# Patient Record
Sex: Female | Born: 1940
Health system: Southern US, Community
[De-identification: ages and names within clinical notes are randomized; demographics above are authoritative.]

## PROBLEM LIST (undated history)

## (undated) DIAGNOSIS — I311 Chronic constrictive pericarditis: Secondary | ICD-10-CM

## (undated) DIAGNOSIS — I4891 Unspecified atrial fibrillation: Secondary | ICD-10-CM

## (undated) DIAGNOSIS — I10 Essential (primary) hypertension: Secondary | ICD-10-CM

## (undated) DIAGNOSIS — J189 Pneumonia, unspecified organism: Secondary | ICD-10-CM

## (undated) DIAGNOSIS — M1990 Unspecified osteoarthritis, unspecified site: Secondary | ICD-10-CM

## (undated) DIAGNOSIS — M109 Gout, unspecified: Secondary | ICD-10-CM

## (undated) DIAGNOSIS — I471 Supraventricular tachycardia, unspecified: Secondary | ICD-10-CM

## (undated) DIAGNOSIS — N2889 Other specified disorders of kidney and ureter: Secondary | ICD-10-CM

## (undated) DIAGNOSIS — I509 Heart failure, unspecified: Secondary | ICD-10-CM

## (undated) DIAGNOSIS — J383 Other diseases of vocal cords: Secondary | ICD-10-CM

## (undated) HISTORY — PX: EYE SURGERY: SHX253

## (undated) HISTORY — DX: Unspecified atrial fibrillation: I48.91

## (undated) HISTORY — DX: Supraventricular tachycardia, unspecified: I47.10

## (undated) HISTORY — DX: Supraventricular tachycardia: I47.1

## (undated) HISTORY — DX: Chronic constrictive pericarditis: I31.1

## (undated) HISTORY — PX: TONSILLECTOMY: SUR1361

## (undated) HISTORY — PX: PERICARDIECTOMY: SHX2214

## (undated) HISTORY — DX: Other diseases of vocal cords: J38.3

## (undated) HISTORY — DX: Gout, unspecified: M10.9

## (undated) HISTORY — PX: CHOLECYSTECTOMY: SHX55

## (undated) HISTORY — PX: ABDOMINAL HYSTERECTOMY: SHX81

## (undated) HISTORY — DX: Essential (primary) hypertension: I10

---

## 1999-05-22 ENCOUNTER — Encounter: Payer: Self-pay | Admitting: Gastroenterology

## 1999-05-22 ENCOUNTER — Ambulatory Visit (HOSPITAL_COMMUNITY): Admission: RE | Admit: 1999-05-22 | Discharge: 1999-05-22 | Payer: Self-pay | Admitting: Gastroenterology

## 1999-12-14 ENCOUNTER — Ambulatory Visit (HOSPITAL_COMMUNITY): Admission: RE | Admit: 1999-12-14 | Discharge: 1999-12-14 | Payer: Self-pay | Admitting: Gastroenterology

## 1999-12-14 ENCOUNTER — Encounter (INDEPENDENT_AMBULATORY_CARE_PROVIDER_SITE_OTHER): Payer: Self-pay

## 2000-06-27 ENCOUNTER — Encounter (INDEPENDENT_AMBULATORY_CARE_PROVIDER_SITE_OTHER): Payer: Self-pay | Admitting: *Deleted

## 2000-06-27 ENCOUNTER — Ambulatory Visit (HOSPITAL_COMMUNITY): Admission: RE | Admit: 2000-06-27 | Discharge: 2000-06-27 | Payer: Self-pay | Admitting: Gastroenterology

## 2001-12-12 ENCOUNTER — Encounter: Payer: Self-pay | Admitting: Gastroenterology

## 2001-12-12 ENCOUNTER — Ambulatory Visit (HOSPITAL_COMMUNITY): Admission: RE | Admit: 2001-12-12 | Discharge: 2001-12-12 | Payer: Self-pay | Admitting: Gastroenterology

## 2002-07-08 ENCOUNTER — Emergency Department (HOSPITAL_COMMUNITY): Admission: EM | Admit: 2002-07-08 | Discharge: 2002-07-08 | Payer: Self-pay

## 2002-07-23 ENCOUNTER — Inpatient Hospital Stay (HOSPITAL_COMMUNITY): Admission: EM | Admit: 2002-07-23 | Discharge: 2002-07-25 | Payer: Self-pay | Admitting: Emergency Medicine

## 2002-08-02 ENCOUNTER — Ambulatory Visit (HOSPITAL_COMMUNITY): Admission: RE | Admit: 2002-08-02 | Discharge: 2002-08-02 | Payer: Self-pay | Admitting: Internal Medicine

## 2002-08-21 ENCOUNTER — Ambulatory Visit (HOSPITAL_COMMUNITY): Admission: RE | Admit: 2002-08-21 | Discharge: 2002-08-22 | Payer: Self-pay | Admitting: Internal Medicine

## 2002-10-24 ENCOUNTER — Ambulatory Visit (HOSPITAL_COMMUNITY): Admission: RE | Admit: 2002-10-24 | Discharge: 2002-10-24 | Payer: Self-pay | Admitting: Gastroenterology

## 2002-10-24 ENCOUNTER — Encounter (INDEPENDENT_AMBULATORY_CARE_PROVIDER_SITE_OTHER): Payer: Self-pay | Admitting: *Deleted

## 2004-03-25 ENCOUNTER — Encounter: Admission: RE | Admit: 2004-03-25 | Discharge: 2004-06-23 | Payer: Self-pay | Admitting: Family Medicine

## 2004-12-16 ENCOUNTER — Ambulatory Visit (HOSPITAL_COMMUNITY): Admission: RE | Admit: 2004-12-16 | Discharge: 2004-12-16 | Payer: Self-pay | Admitting: Cardiovascular Disease

## 2004-12-16 ENCOUNTER — Encounter: Payer: Self-pay | Admitting: Cardiovascular Disease

## 2004-12-25 ENCOUNTER — Ambulatory Visit (HOSPITAL_COMMUNITY): Admission: RE | Admit: 2004-12-25 | Discharge: 2004-12-25 | Payer: Self-pay | Admitting: Cardiovascular Disease

## 2004-12-28 ENCOUNTER — Ambulatory Visit (HOSPITAL_COMMUNITY): Admission: RE | Admit: 2004-12-28 | Discharge: 2004-12-28 | Payer: Self-pay | Admitting: Cardiovascular Disease

## 2005-01-06 ENCOUNTER — Ambulatory Visit (HOSPITAL_COMMUNITY): Admission: RE | Admit: 2005-01-06 | Discharge: 2005-01-06 | Payer: Self-pay | Admitting: Cardiovascular Disease

## 2005-03-29 ENCOUNTER — Encounter (HOSPITAL_COMMUNITY): Admission: RE | Admit: 2005-03-29 | Discharge: 2005-05-11 | Payer: Self-pay | Admitting: Cardiovascular Disease

## 2005-05-12 ENCOUNTER — Encounter (HOSPITAL_COMMUNITY): Admission: RE | Admit: 2005-05-12 | Discharge: 2005-08-10 | Payer: Self-pay | Admitting: Cardiovascular Disease

## 2005-08-09 ENCOUNTER — Ambulatory Visit (HOSPITAL_COMMUNITY): Admission: RE | Admit: 2005-08-09 | Discharge: 2005-08-09 | Payer: Self-pay | Admitting: Cardiovascular Disease

## 2005-08-11 ENCOUNTER — Encounter (HOSPITAL_COMMUNITY): Admission: RE | Admit: 2005-08-11 | Discharge: 2005-11-09 | Payer: Self-pay | Admitting: Cardiovascular Disease

## 2005-11-11 ENCOUNTER — Encounter (HOSPITAL_COMMUNITY): Admission: RE | Admit: 2005-11-11 | Discharge: 2006-02-09 | Payer: Self-pay | Admitting: Cardiovascular Disease

## 2006-02-10 ENCOUNTER — Encounter (HOSPITAL_COMMUNITY): Admission: RE | Admit: 2006-02-10 | Discharge: 2006-05-11 | Payer: Self-pay | Admitting: Cardiovascular Disease

## 2006-05-12 ENCOUNTER — Encounter (HOSPITAL_COMMUNITY): Admission: RE | Admit: 2006-05-12 | Discharge: 2006-08-10 | Payer: Self-pay | Admitting: Cardiovascular Disease

## 2006-08-11 ENCOUNTER — Encounter (HOSPITAL_COMMUNITY): Admission: RE | Admit: 2006-08-11 | Discharge: 2006-11-09 | Payer: Self-pay | Admitting: Cardiovascular Disease

## 2006-11-11 ENCOUNTER — Encounter (HOSPITAL_COMMUNITY): Admission: RE | Admit: 2006-11-11 | Discharge: 2007-02-09 | Payer: Self-pay | Admitting: Cardiovascular Disease

## 2007-02-10 ENCOUNTER — Encounter (HOSPITAL_COMMUNITY): Admission: RE | Admit: 2007-02-10 | Discharge: 2007-05-11 | Payer: Self-pay | Admitting: Cardiovascular Disease

## 2007-05-12 ENCOUNTER — Encounter (HOSPITAL_COMMUNITY): Admission: RE | Admit: 2007-05-12 | Discharge: 2007-08-10 | Payer: Self-pay | Admitting: Cardiovascular Disease

## 2007-08-12 ENCOUNTER — Encounter (HOSPITAL_COMMUNITY): Admission: RE | Admit: 2007-08-12 | Discharge: 2007-10-10 | Payer: Self-pay | Admitting: Cardiovascular Disease

## 2007-10-12 ENCOUNTER — Encounter (HOSPITAL_COMMUNITY): Admission: RE | Admit: 2007-10-12 | Discharge: 2008-01-04 | Payer: Self-pay | Admitting: Cardiovascular Disease

## 2007-11-01 ENCOUNTER — Ambulatory Visit (HOSPITAL_COMMUNITY): Admission: RE | Admit: 2007-11-01 | Discharge: 2007-11-01 | Payer: Self-pay | Admitting: Cardiovascular Disease

## 2008-01-10 ENCOUNTER — Encounter (HOSPITAL_COMMUNITY): Admission: RE | Admit: 2008-01-10 | Discharge: 2008-04-09 | Payer: Self-pay | Admitting: Cardiovascular Disease

## 2008-04-10 ENCOUNTER — Encounter (HOSPITAL_COMMUNITY): Admission: RE | Admit: 2008-04-10 | Discharge: 2008-07-09 | Payer: Self-pay | Admitting: Cardiovascular Disease

## 2008-07-11 ENCOUNTER — Encounter (HOSPITAL_COMMUNITY): Admission: RE | Admit: 2008-07-11 | Discharge: 2008-10-07 | Payer: Self-pay | Admitting: Cardiovascular Disease

## 2008-10-11 ENCOUNTER — Encounter (HOSPITAL_COMMUNITY): Admission: RE | Admit: 2008-10-11 | Discharge: 2009-01-09 | Payer: Self-pay | Admitting: Cardiovascular Disease

## 2009-01-10 ENCOUNTER — Encounter (HOSPITAL_COMMUNITY): Admission: RE | Admit: 2009-01-10 | Discharge: 2009-04-10 | Payer: Self-pay | Admitting: Cardiovascular Disease

## 2009-04-11 ENCOUNTER — Encounter (HOSPITAL_COMMUNITY): Admission: RE | Admit: 2009-04-11 | Discharge: 2009-07-10 | Payer: Self-pay | Admitting: Cardiovascular Disease

## 2009-06-30 ENCOUNTER — Ambulatory Visit (HOSPITAL_COMMUNITY): Admission: RE | Admit: 2009-06-30 | Discharge: 2009-06-30 | Payer: Self-pay | Admitting: Cardiovascular Disease

## 2009-06-30 ENCOUNTER — Encounter: Payer: Self-pay | Admitting: Cardiovascular Disease

## 2009-07-11 ENCOUNTER — Encounter (HOSPITAL_COMMUNITY): Admission: RE | Admit: 2009-07-11 | Discharge: 2009-10-09 | Payer: Self-pay | Admitting: Cardiovascular Disease

## 2009-10-11 ENCOUNTER — Encounter (HOSPITAL_COMMUNITY): Admission: RE | Admit: 2009-10-11 | Discharge: 2010-01-09 | Payer: Self-pay | Admitting: Cardiovascular Disease

## 2010-01-10 ENCOUNTER — Encounter (HOSPITAL_COMMUNITY): Admission: RE | Admit: 2010-01-10 | Discharge: 2010-04-10 | Payer: Self-pay | Admitting: Cardiovascular Disease

## 2010-04-11 ENCOUNTER — Encounter (HOSPITAL_COMMUNITY): Admission: RE | Admit: 2010-04-11 | Discharge: 2010-07-10 | Payer: Self-pay | Admitting: Cardiovascular Disease

## 2010-07-11 ENCOUNTER — Encounter (HOSPITAL_COMMUNITY)
Admission: RE | Admit: 2010-07-11 | Discharge: 2010-10-09 | Payer: Self-pay | Source: Home / Self Care | Attending: Cardiovascular Disease | Admitting: Cardiovascular Disease

## 2010-10-13 ENCOUNTER — Encounter (HOSPITAL_COMMUNITY)
Admission: RE | Admit: 2010-10-13 | Discharge: 2010-11-10 | Payer: Self-pay | Source: Home / Self Care | Attending: Cardiovascular Disease | Admitting: Cardiovascular Disease

## 2010-11-01 ENCOUNTER — Encounter: Payer: Self-pay | Admitting: Cardiovascular Disease

## 2010-11-04 ENCOUNTER — Ambulatory Visit: Payer: Self-pay | Admitting: Cardiovascular Disease

## 2010-11-10 ENCOUNTER — Ambulatory Visit: Payer: Self-pay | Admitting: Cardiology

## 2010-11-11 ENCOUNTER — Ambulatory Visit (HOSPITAL_COMMUNITY): Payer: Self-pay

## 2010-11-11 ENCOUNTER — Ambulatory Visit (HOSPITAL_COMMUNITY)
Admission: RE | Admit: 2010-11-11 | Discharge: 2010-11-11 | Payer: Self-pay | Source: Ambulatory Visit | Attending: Cardiovascular Disease | Admitting: Cardiovascular Disease

## 2010-11-12 ENCOUNTER — Encounter (HOSPITAL_COMMUNITY): Payer: Self-pay

## 2010-11-12 ENCOUNTER — Ambulatory Visit (HOSPITAL_COMMUNITY): Payer: Self-pay

## 2010-11-17 ENCOUNTER — Encounter (HOSPITAL_COMMUNITY): Admission: RE | Admit: 2010-11-17 | Payer: Self-pay | Source: Ambulatory Visit

## 2010-11-17 ENCOUNTER — Ambulatory Visit (HOSPITAL_COMMUNITY): Payer: Self-pay

## 2010-11-17 DIAGNOSIS — I319 Disease of pericardium, unspecified: Secondary | ICD-10-CM | POA: Insufficient documentation

## 2010-11-17 DIAGNOSIS — Z5189 Encounter for other specified aftercare: Secondary | ICD-10-CM | POA: Insufficient documentation

## 2010-11-17 DIAGNOSIS — I471 Supraventricular tachycardia, unspecified: Secondary | ICD-10-CM | POA: Insufficient documentation

## 2010-11-17 DIAGNOSIS — I4891 Unspecified atrial fibrillation: Secondary | ICD-10-CM | POA: Insufficient documentation

## 2010-11-17 DIAGNOSIS — I10 Essential (primary) hypertension: Secondary | ICD-10-CM | POA: Insufficient documentation

## 2010-11-17 DIAGNOSIS — Z8249 Family history of ischemic heart disease and other diseases of the circulatory system: Secondary | ICD-10-CM | POA: Insufficient documentation

## 2010-11-18 ENCOUNTER — Ambulatory Visit (HOSPITAL_COMMUNITY): Payer: Self-pay

## 2010-11-18 ENCOUNTER — Encounter (HOSPITAL_COMMUNITY): Payer: Self-pay | Attending: Cardiovascular Disease

## 2010-11-19 ENCOUNTER — Ambulatory Visit (HOSPITAL_COMMUNITY): Payer: Self-pay

## 2010-11-19 ENCOUNTER — Encounter (HOSPITAL_COMMUNITY): Payer: Self-pay

## 2010-11-24 ENCOUNTER — Encounter (HOSPITAL_COMMUNITY): Payer: Self-pay

## 2010-11-24 ENCOUNTER — Ambulatory Visit (HOSPITAL_COMMUNITY): Payer: Self-pay

## 2010-11-25 ENCOUNTER — Ambulatory Visit (HOSPITAL_COMMUNITY): Payer: Self-pay

## 2010-11-25 ENCOUNTER — Encounter (HOSPITAL_COMMUNITY): Payer: Self-pay

## 2010-11-26 ENCOUNTER — Ambulatory Visit (HOSPITAL_COMMUNITY): Payer: Self-pay

## 2010-11-26 ENCOUNTER — Encounter (HOSPITAL_COMMUNITY): Payer: Self-pay

## 2010-12-01 ENCOUNTER — Encounter (HOSPITAL_COMMUNITY): Payer: Self-pay

## 2010-12-01 ENCOUNTER — Ambulatory Visit (HOSPITAL_COMMUNITY): Payer: Self-pay

## 2010-12-02 ENCOUNTER — Ambulatory Visit (HOSPITAL_COMMUNITY): Payer: Self-pay

## 2010-12-02 ENCOUNTER — Encounter (HOSPITAL_COMMUNITY): Payer: Self-pay

## 2010-12-03 ENCOUNTER — Encounter (HOSPITAL_COMMUNITY): Payer: Self-pay

## 2010-12-03 ENCOUNTER — Ambulatory Visit (HOSPITAL_COMMUNITY): Payer: Self-pay

## 2010-12-08 ENCOUNTER — Ambulatory Visit (HOSPITAL_COMMUNITY): Payer: Self-pay

## 2010-12-08 ENCOUNTER — Encounter (HOSPITAL_COMMUNITY): Payer: Self-pay

## 2010-12-09 ENCOUNTER — Ambulatory Visit (HOSPITAL_COMMUNITY): Payer: Self-pay

## 2010-12-09 ENCOUNTER — Encounter (HOSPITAL_COMMUNITY): Admission: RE | Admit: 2010-12-09 | Payer: Self-pay | Source: Ambulatory Visit

## 2010-12-10 ENCOUNTER — Ambulatory Visit (HOSPITAL_COMMUNITY): Payer: Self-pay

## 2010-12-10 ENCOUNTER — Encounter (HOSPITAL_COMMUNITY): Payer: Self-pay | Attending: Cardiovascular Disease

## 2010-12-10 DIAGNOSIS — I471 Supraventricular tachycardia, unspecified: Secondary | ICD-10-CM | POA: Insufficient documentation

## 2010-12-10 DIAGNOSIS — Z5189 Encounter for other specified aftercare: Secondary | ICD-10-CM | POA: Insufficient documentation

## 2010-12-10 DIAGNOSIS — I10 Essential (primary) hypertension: Secondary | ICD-10-CM | POA: Insufficient documentation

## 2010-12-10 DIAGNOSIS — Z8249 Family history of ischemic heart disease and other diseases of the circulatory system: Secondary | ICD-10-CM | POA: Insufficient documentation

## 2010-12-10 DIAGNOSIS — I319 Disease of pericardium, unspecified: Secondary | ICD-10-CM | POA: Insufficient documentation

## 2010-12-10 DIAGNOSIS — I4891 Unspecified atrial fibrillation: Secondary | ICD-10-CM | POA: Insufficient documentation

## 2010-12-15 ENCOUNTER — Ambulatory Visit (HOSPITAL_COMMUNITY): Payer: Self-pay

## 2010-12-15 ENCOUNTER — Encounter (HOSPITAL_COMMUNITY): Payer: Self-pay

## 2010-12-16 ENCOUNTER — Encounter (HOSPITAL_COMMUNITY): Payer: Self-pay

## 2010-12-16 ENCOUNTER — Ambulatory Visit (HOSPITAL_COMMUNITY): Payer: Self-pay

## 2010-12-17 ENCOUNTER — Ambulatory Visit (HOSPITAL_COMMUNITY): Payer: Self-pay

## 2010-12-17 ENCOUNTER — Encounter (HOSPITAL_COMMUNITY): Payer: Self-pay

## 2010-12-22 ENCOUNTER — Ambulatory Visit (HOSPITAL_COMMUNITY): Payer: Self-pay

## 2010-12-22 ENCOUNTER — Encounter (HOSPITAL_COMMUNITY): Payer: Self-pay

## 2010-12-23 ENCOUNTER — Ambulatory Visit (HOSPITAL_COMMUNITY): Payer: Self-pay

## 2010-12-23 ENCOUNTER — Encounter (HOSPITAL_COMMUNITY): Payer: Self-pay

## 2010-12-24 ENCOUNTER — Ambulatory Visit (HOSPITAL_COMMUNITY): Payer: Self-pay

## 2010-12-24 ENCOUNTER — Encounter (HOSPITAL_COMMUNITY): Payer: Self-pay

## 2010-12-29 ENCOUNTER — Ambulatory Visit (HOSPITAL_COMMUNITY): Payer: Self-pay

## 2010-12-29 ENCOUNTER — Encounter (HOSPITAL_COMMUNITY): Payer: Self-pay

## 2010-12-30 ENCOUNTER — Ambulatory Visit (HOSPITAL_COMMUNITY): Payer: Self-pay

## 2010-12-30 ENCOUNTER — Encounter (HOSPITAL_COMMUNITY): Payer: Self-pay

## 2010-12-31 ENCOUNTER — Encounter (HOSPITAL_COMMUNITY): Payer: Self-pay

## 2010-12-31 ENCOUNTER — Ambulatory Visit (HOSPITAL_COMMUNITY): Payer: Self-pay

## 2011-01-05 ENCOUNTER — Encounter (HOSPITAL_COMMUNITY): Admission: RE | Admit: 2011-01-05 | Payer: Self-pay | Source: Ambulatory Visit

## 2011-01-05 ENCOUNTER — Ambulatory Visit (HOSPITAL_COMMUNITY): Payer: Self-pay

## 2011-01-06 ENCOUNTER — Encounter (HOSPITAL_COMMUNITY): Payer: Self-pay

## 2011-01-06 ENCOUNTER — Ambulatory Visit (HOSPITAL_COMMUNITY): Payer: Self-pay

## 2011-01-07 ENCOUNTER — Encounter (HOSPITAL_COMMUNITY): Payer: Self-pay

## 2011-01-07 ENCOUNTER — Ambulatory Visit (HOSPITAL_COMMUNITY): Payer: Self-pay

## 2011-01-12 ENCOUNTER — Encounter (HOSPITAL_COMMUNITY): Payer: Self-pay | Attending: Cardiovascular Disease

## 2011-01-12 ENCOUNTER — Ambulatory Visit (HOSPITAL_COMMUNITY): Payer: Self-pay

## 2011-01-12 DIAGNOSIS — Z8249 Family history of ischemic heart disease and other diseases of the circulatory system: Secondary | ICD-10-CM | POA: Insufficient documentation

## 2011-01-12 DIAGNOSIS — Z5189 Encounter for other specified aftercare: Secondary | ICD-10-CM | POA: Insufficient documentation

## 2011-01-12 DIAGNOSIS — I319 Disease of pericardium, unspecified: Secondary | ICD-10-CM | POA: Insufficient documentation

## 2011-01-12 DIAGNOSIS — I471 Supraventricular tachycardia, unspecified: Secondary | ICD-10-CM | POA: Insufficient documentation

## 2011-01-12 DIAGNOSIS — I10 Essential (primary) hypertension: Secondary | ICD-10-CM | POA: Insufficient documentation

## 2011-01-12 DIAGNOSIS — I4891 Unspecified atrial fibrillation: Secondary | ICD-10-CM | POA: Insufficient documentation

## 2011-01-13 ENCOUNTER — Encounter (HOSPITAL_COMMUNITY): Payer: Self-pay

## 2011-01-13 ENCOUNTER — Ambulatory Visit (HOSPITAL_COMMUNITY): Payer: Self-pay

## 2011-01-14 ENCOUNTER — Encounter (HOSPITAL_COMMUNITY): Payer: Self-pay

## 2011-01-14 ENCOUNTER — Ambulatory Visit (HOSPITAL_COMMUNITY): Payer: Self-pay

## 2011-01-15 LAB — PROTIME-INR: INR: 1.9 — ABNORMAL HIGH (ref 0.00–1.49)

## 2011-01-19 ENCOUNTER — Ambulatory Visit (HOSPITAL_COMMUNITY): Payer: Self-pay

## 2011-01-19 ENCOUNTER — Encounter (HOSPITAL_COMMUNITY): Payer: Self-pay

## 2011-01-20 ENCOUNTER — Ambulatory Visit (HOSPITAL_COMMUNITY): Payer: Self-pay

## 2011-01-20 ENCOUNTER — Encounter (HOSPITAL_COMMUNITY): Payer: Self-pay

## 2011-01-21 ENCOUNTER — Encounter (HOSPITAL_COMMUNITY): Payer: Self-pay

## 2011-01-21 ENCOUNTER — Ambulatory Visit (HOSPITAL_COMMUNITY): Payer: Self-pay

## 2011-01-26 ENCOUNTER — Ambulatory Visit (HOSPITAL_COMMUNITY): Payer: Self-pay

## 2011-01-26 ENCOUNTER — Encounter (HOSPITAL_COMMUNITY): Payer: Self-pay

## 2011-01-27 ENCOUNTER — Encounter (HOSPITAL_COMMUNITY): Payer: Self-pay

## 2011-01-27 ENCOUNTER — Ambulatory Visit (HOSPITAL_COMMUNITY): Payer: Self-pay

## 2011-01-28 ENCOUNTER — Encounter (HOSPITAL_COMMUNITY): Payer: Self-pay

## 2011-01-28 ENCOUNTER — Ambulatory Visit (HOSPITAL_COMMUNITY): Payer: Self-pay

## 2011-02-02 ENCOUNTER — Ambulatory Visit (HOSPITAL_COMMUNITY): Payer: Self-pay

## 2011-02-02 ENCOUNTER — Encounter (HOSPITAL_COMMUNITY): Payer: Self-pay

## 2011-02-03 ENCOUNTER — Ambulatory Visit (HOSPITAL_COMMUNITY): Payer: Self-pay

## 2011-02-03 ENCOUNTER — Encounter (HOSPITAL_COMMUNITY): Payer: Self-pay

## 2011-02-04 ENCOUNTER — Encounter (HOSPITAL_COMMUNITY): Payer: Self-pay

## 2011-02-04 ENCOUNTER — Ambulatory Visit (HOSPITAL_COMMUNITY): Payer: Self-pay

## 2011-02-09 ENCOUNTER — Encounter (HOSPITAL_COMMUNITY): Payer: Self-pay

## 2011-02-09 ENCOUNTER — Ambulatory Visit (HOSPITAL_COMMUNITY): Payer: Self-pay

## 2011-02-10 ENCOUNTER — Encounter (HOSPITAL_COMMUNITY): Payer: Self-pay

## 2011-02-10 ENCOUNTER — Ambulatory Visit (HOSPITAL_COMMUNITY): Payer: Self-pay

## 2011-02-11 ENCOUNTER — Ambulatory Visit (HOSPITAL_COMMUNITY): Payer: Self-pay

## 2011-02-11 ENCOUNTER — Encounter (HOSPITAL_COMMUNITY): Payer: Self-pay

## 2011-02-16 ENCOUNTER — Encounter (HOSPITAL_COMMUNITY): Admission: RE | Admit: 2011-02-16 | Payer: Self-pay | Source: Ambulatory Visit

## 2011-02-16 ENCOUNTER — Ambulatory Visit (HOSPITAL_COMMUNITY): Payer: Self-pay

## 2011-02-16 DIAGNOSIS — I10 Essential (primary) hypertension: Secondary | ICD-10-CM | POA: Insufficient documentation

## 2011-02-16 DIAGNOSIS — Z5189 Encounter for other specified aftercare: Secondary | ICD-10-CM | POA: Insufficient documentation

## 2011-02-16 DIAGNOSIS — I319 Disease of pericardium, unspecified: Secondary | ICD-10-CM | POA: Insufficient documentation

## 2011-02-16 DIAGNOSIS — I4891 Unspecified atrial fibrillation: Secondary | ICD-10-CM | POA: Insufficient documentation

## 2011-02-16 DIAGNOSIS — I471 Supraventricular tachycardia, unspecified: Secondary | ICD-10-CM | POA: Insufficient documentation

## 2011-02-16 DIAGNOSIS — Z8249 Family history of ischemic heart disease and other diseases of the circulatory system: Secondary | ICD-10-CM | POA: Insufficient documentation

## 2011-02-17 ENCOUNTER — Ambulatory Visit (HOSPITAL_COMMUNITY): Payer: Self-pay

## 2011-02-17 ENCOUNTER — Encounter (HOSPITAL_COMMUNITY): Payer: Self-pay | Attending: Cardiovascular Disease

## 2011-02-18 ENCOUNTER — Ambulatory Visit (HOSPITAL_COMMUNITY): Payer: Self-pay

## 2011-02-18 ENCOUNTER — Encounter (HOSPITAL_COMMUNITY): Payer: Self-pay

## 2011-02-23 ENCOUNTER — Ambulatory Visit (HOSPITAL_COMMUNITY): Payer: Self-pay

## 2011-02-23 ENCOUNTER — Encounter (HOSPITAL_COMMUNITY): Payer: Self-pay

## 2011-02-23 NOTE — H&P (Signed)
NAMEEMMALEE, SOLIVAN NO.:  [id number]   MEDICAL RECORD NO.:  [id number]           PATIENT TYPE:   LOCATION:                                 FACILITY:   PHYSICIAN:  Vesta Mixer, M.D. DATE OF BIRTH:  1940/11/16   DATE OF ADMISSION:  11/01/2007  DATE OF DISCHARGE:                              HISTORY & PHYSICAL   Yolanda Bell is a middle-aged female with a history of atrial  fibrillation, constrictive pericarditis and liver disease.  Yolanda Bell is  admitted now for elective cardioversion.   Yolanda Bell has a history of constrictive pericarditis.  Yolanda Bell has a history of  pericardial stripping.  Yolanda Bell has been in atrial fibrillation.  We have  cardioverted her in the past, but Yolanda Bell has converted back to atrial  fibrillation.  Yolanda Bell is now admitted again for cardioversion.   Yolanda Bell has done fairly well.  Yolanda Bell has had some fatigue and some shortness  of breath, but has not had any syncope or presyncope.   Yolanda Bell has normal left ventricular systolic function with an ejection  fraction of 50 to 55%.   CURRENT MEDICATIONS:  1. Toprol XL 50 mg p.o. b.i.d.  2. Aldactone 50 mg a day.  3. Torsemide 20 mg a day.  4. Digoxin 0.25 mg a day.  5. Fish oil 1200 mg 3 times a day.  6. Aspirin 81 mg a day.  7. Fosamax once a week.  8. Vitamin B complex once a day.  9. Calcium once a day.  10.Coumadin 10 mg 1 day a week, with 7.5 mg 6 days a week.   ALLERGIES:  Yolanda Bell is allergic to ERYTHROMYCIN which causes nausea and  vomiting.   PAST MEDICAL HISTORY:  1. History of liver cirrhosis.  2. History of pericardial constriction -- status post pericardectomy.  3. Atrial fibrillation.  4. Spastic dysphonia.   SOCIAL HISTORY:  The patient is a nonsmoker, and Yolanda Bell does not drink  alcohol.   FAMILY HISTORY:  Positive for valvular disease and coronary artery  disease.   REVIEW OF SYSTEMS:  Reviewed and is essentially negative.   PHYSICAL EXAMINATION:  GENERAL:  On exam Yolanda Bell is a middle-aged female  in  no acute distress.  Yolanda Bell is alert and oriented x3, and her mood and  affect are normal.  VITALS:  Her weight is 168, blood pressure is 110/70, with a heart rate  of 93.  HEENT: Exam reveals 2+ carotids.  Yolanda Bell has no bruits, no JVD, no  thyromegaly.  LUNGS: Clear to auscultation.  HEART:  Irregularly irregular.  ABDOMINAL:  Exam reveals good bowel sounds and is nontender.  EXTREMITIES:  Yolanda Bell has no clubbing, cyanosis or edema.  NEURO:  Exam is nonfocal.   Her EKG reveals atrial fibrillation with a controlled ventricular  response.  Yolanda Bell has nonspecific ST and T-wave changes.   Yolanda Bell presents with persistent atrial fibrillation for the past several  months.  Yolanda Bell has been on Coumadin and has been therapeutic for over a  month.  We will schedule her for elective cardioversion.  We have  discussed the  risks, benefits and options of cardioversion.  Yolanda Bell  understands and agrees to proceed.           ______________________________  Vesta Mixer, M.D.     PJN/MEDQ  D:  10/26/2007  T:  10/26/2007  Job:  [id number]   cc:   Caryn Bee L. Little, M.D.

## 2011-02-24 ENCOUNTER — Encounter (HOSPITAL_COMMUNITY): Payer: Self-pay

## 2011-02-24 ENCOUNTER — Ambulatory Visit (HOSPITAL_COMMUNITY): Payer: Self-pay

## 2011-02-25 ENCOUNTER — Encounter (HOSPITAL_COMMUNITY): Payer: Self-pay

## 2011-02-25 ENCOUNTER — Ambulatory Visit (HOSPITAL_COMMUNITY): Payer: Self-pay

## 2011-02-26 NOTE — Discharge Summary (Signed)
   NAME:  CHINWE, LOPE NO.:  [id number]   MEDICAL RECORD NO.:  [id number]                   PATIENT TYPE:  OIB   LOCATION:  2022                                 FACILITY:  [facility]   PHYSICIAN:  Chinita Pester, C.R.N.P. [facility]           DATE OF BIRTH:  02-03-1941   DATE OF ADMISSION:  08/21/2002  DATE OF DISCHARGE:  08/22/2002                                 DISCHARGE SUMMARY   PRIMARY DISCHARGE DIAGNOSIS:  Atrial tachycardia.   HISTORY OF PRESENT ILLNESS:  This is a 70 year old female with a past  history of liver disease, atrial fibrillation, by report on Coumadin.  The  patient developed regular tachy palpitations associated with shortness of  breath approximately two weeks ago, while being transported by paramedics.  Because of reported hemodynamic instability the patient underwent VC  cardioversion.  Her dose of atenolol was increased to 200 mg daily.  She was  doing well until presenting again to the hospital on the day prior to  admission with tachy palpitations.  Telemetry strips demonstrated a narrow  QRS tachycardia at a rate of 230 beats per minute.  It was unclear whether  this was an SVT or 1:1 atrial flutter.  She was admitted for catheter  ablation.  The patient notes that when she has her SVT, she has rare  palpitations and predominant symptoms is that of chest pressure and  shortness of breath.   HOSPITAL COURSE:  The patient was admitted and the patient had successful  ablation of two pathways.  Impression was two right atrial tachycardias,  both related to CT blank right atrium, one mid right atrium, two  questionable AVNRT, not reproducible, and self-limited, and three was SNRT.  Recommendations were to observe on atenolol 75 mg daily.  The patient was  discharged to home in stable condition on atenolol 75 mg daily, aldactone 75  daily, Lasix 40 daily, Tylenol 1-2 tablets as needed p.r.n. for pain.  Low  fat, low salt, low cholesterol  diet.  She was to shower.  Call for any  drainage or lump develops in her groin.  She was to follow with Chinita Pester  on 09/03/02 at 2 p.m. and follow with Dr. Phineas Real within six weeks.                                               Chinita Pester, C.R.N.P. [facility]    DS/MEDQ  D:  08/22/2002  T:  08/22/2002  Job:  [id number]   cc:   Duke Salvia, M.D. [facility]   Bea Laura Graceann Congress, M.D. [facility]   Shashi Nagaraj

## 2011-02-26 NOTE — Procedures (Signed)
[place]  Patient:    Yolanda Bell, Yolanda Bell                       MRN: [id number] Proc. Date: 06/27/00 Adm. Date:  [id number] Attending:  Nelda Marseille CC:         Petra Kuba, M.D.  Anna Genre Little, M.D.   Procedure Report  PROCEDURE PERFORMED:  Colonoscopy with polypectomy.  ENDOSCOPIST:  Petra Kuba, M.D.  INDICATIONS FOR PROCEDURE:  Large polyp removed from the rectosigmoid junction, want to recheck to see if any residual polyp remaining.  Consent was signed after risks, benefits, methods, and options were thoroughly discussed in the past in the office.  MEDICATIONS USED:  Demerol 50 mg, Versed 5 mg.  DESCRIPTION OF PROCEDURE:  Rectal inspection was pertinent for external hemorrhoids.  Digital exam was negative.  The pediatric video colonoscope was inserted and easily advanced around the colon to the cecum.  No significant abnormalities but left-sided diverticula were seen.  The cecum was identified by the appendiceal orifice and ileocecal valve.  The prep was adequate.  There was some liquid stool that required washing and suctioning.  On slow withdrawal through the colon, the cecum, ascending and majority of the transverse was normal.  In the approximate area of the splenic flexure, a 2 to 3 mm polyp was seen and was hot biopsied x 2 and put in the first container. The scope was further withdrawn.  Other than left-sided diverticula, no other abnormalities were seen as we slowly withdrew back to the rectosigmoid junction and behind the rectosigmoid ____________ possibly a very small amount of adenomatous tissue in a sessile nature was seen and multiple hot biopsies of this area were obtained.  The scope was then retroflexed which revealed some internal hemorrhoids.  We did go ahead and roll her on her back and took a few more hot biopsies.  There was no obvious residual adenomatous tissue but we could not be sure.  We got better  visualization with her on her left side and we went ahead and proceeded with painting the polypoid area using the argon plasma coagulator in the customary fashion.  The patient tolerated this part of the procedure well.  There was no obvious adenomatous tissue remaining but obviously based on necrosis and edema, this is difficult to say.  Air was suctioned, scope removed.  The patient tolerated the procedure well.  There was no obvious immediate complication.  ENDOSCOPIC DIAGNOSIS: 1. Internal and external hemorrhoids. 2. Left diverticula. 3. Probable slight residual sessile polyp at the rectosigmoid junction behind    the folds, status post multiple hot biopsies and using the argon plasma    coagulator afterward. 4. Small splenic flexure polyp, status post hot biopsy put in a different    container. 5. Otherwise within normal limits to the cecum.  PLAN:  Await pathology to determine future colonic screening, probably would proceed sooner than later and otherwise return care to Dr. Clarene Duke for customary health care maintenance to include yearly rectals and guaiacs and will put her on two week customary postpolypectomy instructions. DD:  06/27/00 TD:  06/28/00 Job: [id number]

## 2011-02-26 NOTE — Cardiovascular Report (Signed)
Yolanda Bell, Yolanda Bell NO.:  [id number]   MEDICAL RECORD NO.:  [id number]          PATIENT TYPE:  OIB   LOCATION:  2899                         FACILITY:  [facility]   PHYSICIAN:  Vesta Mixer, M.D. DATE OF BIRTH:  [birth year]   DATE OF PROCEDURE:  01/06/2005  DATE OF DISCHARGE:                              CARDIAC CATHETERIZATION   Yolanda Bell is a 70 year old female with a recent diagnosis of atrial  fibrillation. She has a history of spastic dysphonia and cryptogenic  cirrhosis. She recently has been having lots of problems with fluid  retention and ascites. We performed a CT scan, and she was found to have  significant pericardial calcifications. She is referred for right and left  heart catheterization for further evaluation of the possibility of  pericardial constriction.   PROCEDURES:  Right to left heart catheterization with coronary angiography.   DESCRIPTION OF PROCEDURE:  The patient received 3000 units of IV heparin  with an ACT of 184.   HEMODYNAMICS:  RA pressure is 16. RV pressure is 35/16. Pulmonary capillary  wedge pressure 19. Pulmonary artery pressure is 35/20 with a mean of 26. PA  saturation is 61. RFA saturation 92. Cardiac output by thermodilution is  2.9. Cardiac output by Fick is 2.8. Aortic pressure is 96/610.   LV pressure is 100/15.   Simultaneous LV and RV pressures revealed that there was a discordance of  the LV/RV systolic pressure with inspiration.   There is also a dip in plateau of the RV diastolic filling.   The right atrial pressures did not vary at all even with deep inspiration.  All of these were consistent with pericardial constriction.   The left ventriculogram was performed in a 30 RAO position. It revealed a  low-normal left ventricular systolic function with an ejection fraction of  approximately 50%.   CORONARY ANGIOGRAM:  The left main is smooth and normal.   The left anterior descending artery is smooth and  normal. There are several  moderate size diagonal branches which were normal.   The left circumflex artery is a fairly large vessel. It gives off several  large obtuse marginal arteries which were normal. The circumflex terminates  as two small posterolateral branches.   The right coronary artery is moderate size and is dominant. It is smooth and  normal throughout its course.   Extensive calcification can be seen along the entire pericardium. It is  especially severe on the inferior wall and on the anterior wall.   COMPLICATIONS:  None.   CONCLUSION:  1.  Smooth and normal coronary arteries.  2.  Low-normal left ventricular systolic function  3.  The patient has hemodynamic findings that are consistent with a      pericardial constriction. She has extensive pericardial calcification as      seen on this heart catheterization. She has also had pericardial      calcification seen by CT scan. We will review this data. She will quite      likely need to go for pericardial stripping.      PJN/MEDQ  D:  01/06/2005  T:  01/06/2005  Job:  [id number]

## 2011-02-26 NOTE — Consult Note (Signed)
NAME:  Yolanda Bell, Yolanda Bell NO.:  [id number]   MEDICAL RECORD NO.:  [id number]                   PATIENT TYPE:  INP   LOCATION:  3735                                 FACILITY:  [facility]   PHYSICIAN:  Doylene Canning. Ladona Ridgel, M.D. [facility]           DATE OF BIRTH:  March 31, 1941   DATE OF CONSULTATION:  07/24/2002  DATE OF DISCHARGE:                                   CONSULTATION   INDICATION FOR CONSULTATION:  Evaluation of a narrow QRS tachycardia  associated with near syncope.   HISTORY OF PRESENT ILLNESS:  The patient is a very pleasant 70 year old  woman with a history of liver disease and history of atrial fibrillation by  report, on Coumadin.  The patient developed regular tachy-palpitations  associated with shortness of breath approximately two weeks ago and while  being transported by the paramedics, because of reported hemodynamic  instability, the patient underwent D-C cardioversion.  Her dose of atenolol  was increased to 200 mg a day and she was doing well, until presenting again  to the hospital yesterday with recurrent tachy-palpitations.  Telemetry  strips demonstrated a narrow QRS tachycardia at a rate of 230 beats a  minute.  It was unclear whether this represented SVT or 1:1 atrial flutter.  She was admitted for catheter ablation.  The patient notes that when she has  her SVT, she has rare palpitations and her predominant symptom is that of  chest pressure and shortness of breath.  She also complains of symptoms of  an upper respiratory infection over the last several days without any  obvious fevers.   PAST MEDICAL HISTORY:  Past medical history is notable for a history of  liver disease, a history of hypertension, a history of spasmodic dysphonia,  a history of atrial fibrillation with D-C cardioversion done back in  February of 2002 and by her report, there is a history of cholecystectomy  and hysterectomy and a history of a liver biopsy.   FAMILY  HISTORY:  Family history is notable for her mother, who is still  living at age 73, and her father, who died at age 50 of coronary disease.   SOCIAL HISTORY:  The patient is single.  She lives in [place].  She is  divorced.  She has one child.  She denies tobacco or ethanol abuse.   MEDICATIONS ON ADMISSION:  1. Coumadin.  2. Atenolol 200 mg per day.   REVIEW OF SYSTEMS:  Review of systems is notable for a sensation of fevers  but no night sweats or adenopathy.  She denies any vision or hearing  problems but does have nasal discharge and some hoarseness.  She denies any  skin rashes or lesions.  She denies chest pain or shortness of breath or  dyspnea on exertion except when she has SVT.  She has no frank syncope but  does have presyncope.  She has had a  recent cough.  She denies arthralgias  or arthritis.  She denies any dysuria, hematuria or nocturia.  She denies  any nausea, vomiting, diarrhea or constipation.  She denies any weakness,  numbness or depression.  She denies polyuria or polydipsia.   PHYSICAL EXAMINATION:  GENERAL:  On physical exam, she is a pleasant, obese,  middle-aged woman in no distress.  VITAL SIGNS:  The blood pressure was 100/60, the pulse 72 and regular, the  respirations were 20.  Weight was 200 pounds.  HEENT:  Normocephalic and atraumatic.  The pupils are equal and round.  The  oropharynx is moist.  The sclerae were anicteric.  NECK:  Neck revealed no jugular venous distention.  Carotids were 2+ and  symmetric.  CARDIOVASCULAR:  Exam revealed a regular rate and rhythm with normal S1 and  S2.  LUNGS:  The lungs were clear bilaterally.  ABDOMEN:  Soft, nontender and nontender.  There was some hepatomegaly  present.  EXTREMITIES:  No cyanosis, clubbing or edema.  NEUROLOGIC:  She is alert and oriented x3 with cranial nerves II-XII grossly  intact.   LABORATORY AND ACCESSORY CLINICAL DATA:  EKG demonstrates normal sinus  rhythm with no clearcut  evidence of ventricular preexcitation.   A telemetry strip demonstrated a narrow QRS tachycardia at a rate of 230  beats per minute.  P waves were not clearcut, although there was certainly a  suggestion that she had atrial flutter with 1:1 AV conduction.   IMPRESSION:  1. Narrow QRS tachycardia, etiology unclear.  2. History of atrial fibrillation.  3. History of liver disease.  4. Chronic Coumadin therapy.   DISCUSSION:  I discussed the treatment options with the patient.  Because of  her very rapid SVT and her severe symptoms, we will plan to proceed with  electrophysiology study and RF catheter ablation, once her INR becomes  subtherapeutic.  The risks, benefits, goals and expectations of this  procedure have been discussed with the patient and she wishes to proceed.                                               Doylene Canning. Ladona Ridgel, M.D. [facility]    GWT/MEDQ  D:  07/24/2002  T:  07/25/2002  Job:  [id number]   cc:   Vesta Mixer, M.D.   Anna Genre Little, M.D.  [address]  [place]  [place] [address]  Fax: [phone]   Kathrine Cords, R.N. [facility]  [address]  [place], [place] [address]  Fax: [phone]

## 2011-02-26 NOTE — Op Note (Signed)
[name], Yolanda Bell NO.:  [id number]   MEDICAL RECORD NO.:  [id number]                    PATIENT TYPE:   LOCATION:                                       FACILITY:   PHYSICIAN:  Petra Kuba, M.D.                 DATE OF BIRTH:  [birth year]   DATE OF PROCEDURE:  10/24/2002  DATE OF DISCHARGE:                                 OPERATIVE REPORT   PROCEDURE PERFORMED:  Colonoscopy with polypectomy.   ENDOSCOPIST:  Petra Kuba, M.D.   INDICATIONS FOR PROCEDURE:  Patient with history of significant polyps due  for repeat screening.  Consent was signed after the risks, benefits, methods  and options were thoroughly discussed with the patient on multiple  occasions.   MEDICINES USED:  Demerol 50 mg, Versed 5 mg.   DESCRIPTION OF PROCEDURE:  Rectal inspection was pertinent for external  hemorrhoids, small.  Digital exam was negative.  A video pediatric  adjustable colonoscope was inserted and easily advanced around the colon to  the cecum.  This did not require any abdominal pressure or any position  changes.  Other than left-sided moderate diverticula, no abnormalities were  seen.  In fact the patient was inserted a short ways into the terminal  ileum, which was normal.  Photodocumentation was obtained.  The scope was  slowly withdrawn.  The prep was adequate.  There was some liquid stool that  required washing and suctioning.  On slow withdrawal through the colon, the  cecum, ascending and transverse were all normal.  The diverticula started  around the splenic flexure and continued to the rectum.  In the midsigmoid,  a tiny polyp was seen and was hot biopsied times one.  Scope was withdrawn  back to the rectum and retroflexed, pertinent for some internal hemorrhoids.  We did not see the rectosigmoid junction polyp on insertion or withdrawal  and could not tell where the previous cautery margins were.  We then spent  the next five to 10 minutes  looking behind all the folds and again could not  find any evidence of any residual polyp or even the previous cautery  margins.  The scope was inserted a short ways up the left side of the colon.  Air was suctioned, scope removed.  The patient tolerated the procedure well  without obvious complication.   ENDOSCOPIC DIAGNOSIS:  1. Internal and external small hemorrhoids.  2. Left-sided diverticula.  3. No obvious residual polyp or polyp cautery site at the rectosigmoid     junction or distal sigmoid.  4. Midsigmoid tiny polyp hot biopsied.  5. Otherwise within normal limits to the cecum and terminal ileum.    PLAN:  Happy to see back p.r.n.  Yearly rectals or guaiacs per Dr. Clarene Duke.  Otherwise probably repeat screening in four years based on previous polyps.  Petra Kuba, M.D.    MEM/MEDQ  D:  10/24/2002  T:  10/24/2002  Job:  [id number]   cc:   Caryn Bee L. Little, M.D.  [address]  [place]  [place] [address]  Fax: [phone]   Vesta Mixer, M.D.  [address]  [place]  [place] [address]  Fax: [phone]

## 2011-03-02 ENCOUNTER — Encounter (HOSPITAL_COMMUNITY): Payer: Self-pay

## 2011-03-02 ENCOUNTER — Ambulatory Visit (HOSPITAL_COMMUNITY): Payer: Self-pay

## 2011-03-03 ENCOUNTER — Ambulatory Visit (HOSPITAL_COMMUNITY): Payer: Self-pay

## 2011-03-03 ENCOUNTER — Encounter (HOSPITAL_COMMUNITY): Payer: Self-pay

## 2011-03-04 ENCOUNTER — Ambulatory Visit (HOSPITAL_COMMUNITY): Payer: Self-pay

## 2011-03-04 ENCOUNTER — Encounter (HOSPITAL_COMMUNITY): Payer: Self-pay

## 2011-03-09 ENCOUNTER — Ambulatory Visit (HOSPITAL_COMMUNITY): Payer: Self-pay

## 2011-03-09 ENCOUNTER — Encounter (HOSPITAL_COMMUNITY): Payer: Self-pay

## 2011-03-10 ENCOUNTER — Ambulatory Visit (HOSPITAL_COMMUNITY): Payer: Self-pay

## 2011-03-10 ENCOUNTER — Encounter (HOSPITAL_COMMUNITY): Payer: Self-pay

## 2011-03-11 ENCOUNTER — Ambulatory Visit (HOSPITAL_COMMUNITY): Payer: Self-pay

## 2011-03-11 ENCOUNTER — Encounter (HOSPITAL_COMMUNITY): Payer: Self-pay

## 2011-03-16 ENCOUNTER — Encounter (HOSPITAL_COMMUNITY): Payer: Self-pay | Attending: Cardiovascular Disease

## 2011-03-16 ENCOUNTER — Ambulatory Visit (HOSPITAL_COMMUNITY): Payer: Self-pay

## 2011-03-16 DIAGNOSIS — I4891 Unspecified atrial fibrillation: Secondary | ICD-10-CM | POA: Insufficient documentation

## 2011-03-16 DIAGNOSIS — I319 Disease of pericardium, unspecified: Secondary | ICD-10-CM | POA: Insufficient documentation

## 2011-03-16 DIAGNOSIS — I471 Supraventricular tachycardia, unspecified: Secondary | ICD-10-CM | POA: Insufficient documentation

## 2011-03-16 DIAGNOSIS — Z5189 Encounter for other specified aftercare: Secondary | ICD-10-CM | POA: Insufficient documentation

## 2011-03-16 DIAGNOSIS — I10 Essential (primary) hypertension: Secondary | ICD-10-CM | POA: Insufficient documentation

## 2011-03-16 DIAGNOSIS — Z8249 Family history of ischemic heart disease and other diseases of the circulatory system: Secondary | ICD-10-CM | POA: Insufficient documentation

## 2011-03-17 ENCOUNTER — Encounter (HOSPITAL_COMMUNITY): Payer: Self-pay

## 2011-03-17 ENCOUNTER — Ambulatory Visit (HOSPITAL_COMMUNITY): Payer: Self-pay

## 2011-03-18 ENCOUNTER — Ambulatory Visit (HOSPITAL_COMMUNITY): Payer: Self-pay

## 2011-03-18 ENCOUNTER — Encounter (HOSPITAL_COMMUNITY): Payer: Self-pay

## 2011-03-23 ENCOUNTER — Encounter (HOSPITAL_COMMUNITY): Payer: Self-pay

## 2011-03-23 ENCOUNTER — Ambulatory Visit (HOSPITAL_COMMUNITY): Payer: Self-pay

## 2011-03-24 ENCOUNTER — Encounter (HOSPITAL_COMMUNITY): Payer: Self-pay

## 2011-03-24 ENCOUNTER — Ambulatory Visit (HOSPITAL_COMMUNITY): Payer: Self-pay

## 2011-03-25 ENCOUNTER — Ambulatory Visit (HOSPITAL_COMMUNITY): Payer: Self-pay

## 2011-03-25 ENCOUNTER — Encounter (HOSPITAL_COMMUNITY): Admission: RE | Admit: 2011-03-25 | Payer: Self-pay | Source: Ambulatory Visit

## 2011-03-30 ENCOUNTER — Ambulatory Visit (HOSPITAL_COMMUNITY): Payer: Self-pay

## 2011-03-30 ENCOUNTER — Encounter (HOSPITAL_COMMUNITY): Payer: Self-pay

## 2011-03-31 ENCOUNTER — Encounter (HOSPITAL_COMMUNITY): Payer: Self-pay

## 2011-03-31 ENCOUNTER — Ambulatory Visit (HOSPITAL_COMMUNITY): Payer: Self-pay

## 2011-04-01 ENCOUNTER — Ambulatory Visit (HOSPITAL_COMMUNITY): Payer: Self-pay

## 2011-04-01 ENCOUNTER — Encounter (HOSPITAL_COMMUNITY): Payer: Self-pay

## 2011-04-06 ENCOUNTER — Ambulatory Visit (HOSPITAL_COMMUNITY): Payer: Self-pay

## 2011-04-06 ENCOUNTER — Encounter (HOSPITAL_COMMUNITY): Payer: Self-pay

## 2011-04-07 ENCOUNTER — Encounter (HOSPITAL_COMMUNITY): Payer: Self-pay

## 2011-04-07 ENCOUNTER — Ambulatory Visit (HOSPITAL_COMMUNITY): Payer: Self-pay

## 2011-04-08 ENCOUNTER — Encounter (HOSPITAL_COMMUNITY): Payer: Self-pay

## 2011-04-08 ENCOUNTER — Ambulatory Visit (HOSPITAL_COMMUNITY): Payer: Self-pay

## 2011-04-13 ENCOUNTER — Encounter (HOSPITAL_COMMUNITY): Payer: BC Managed Care – PPO | Attending: Cardiovascular Disease

## 2011-04-13 ENCOUNTER — Ambulatory Visit (HOSPITAL_COMMUNITY): Payer: Self-pay

## 2011-04-13 DIAGNOSIS — Z5189 Encounter for other specified aftercare: Secondary | ICD-10-CM | POA: Insufficient documentation

## 2011-04-13 DIAGNOSIS — I10 Essential (primary) hypertension: Secondary | ICD-10-CM | POA: Insufficient documentation

## 2011-04-13 DIAGNOSIS — I319 Disease of pericardium, unspecified: Secondary | ICD-10-CM | POA: Insufficient documentation

## 2011-04-13 DIAGNOSIS — Z8249 Family history of ischemic heart disease and other diseases of the circulatory system: Secondary | ICD-10-CM | POA: Insufficient documentation

## 2011-04-13 DIAGNOSIS — I4891 Unspecified atrial fibrillation: Secondary | ICD-10-CM | POA: Insufficient documentation

## 2011-04-13 DIAGNOSIS — I471 Supraventricular tachycardia, unspecified: Secondary | ICD-10-CM | POA: Insufficient documentation

## 2011-04-14 ENCOUNTER — Encounter (HOSPITAL_COMMUNITY): Payer: BC Managed Care – PPO

## 2011-04-14 ENCOUNTER — Ambulatory Visit (HOSPITAL_COMMUNITY): Payer: Self-pay

## 2011-04-15 ENCOUNTER — Ambulatory Visit (HOSPITAL_COMMUNITY): Payer: Self-pay

## 2011-04-15 ENCOUNTER — Encounter (HOSPITAL_COMMUNITY): Payer: BC Managed Care – PPO

## 2011-04-20 ENCOUNTER — Ambulatory Visit (HOSPITAL_COMMUNITY): Payer: Self-pay

## 2011-04-20 ENCOUNTER — Encounter (HOSPITAL_COMMUNITY): Payer: BC Managed Care – PPO

## 2011-04-21 ENCOUNTER — Encounter (HOSPITAL_COMMUNITY): Payer: BC Managed Care – PPO

## 2011-04-21 ENCOUNTER — Ambulatory Visit (HOSPITAL_COMMUNITY): Payer: Self-pay

## 2011-04-22 ENCOUNTER — Encounter (HOSPITAL_COMMUNITY): Payer: BC Managed Care – PPO

## 2011-04-22 ENCOUNTER — Ambulatory Visit (HOSPITAL_COMMUNITY): Payer: Self-pay

## 2011-04-27 ENCOUNTER — Encounter (HOSPITAL_COMMUNITY): Payer: BC Managed Care – PPO

## 2011-04-27 ENCOUNTER — Ambulatory Visit (HOSPITAL_COMMUNITY): Payer: Self-pay

## 2011-04-28 ENCOUNTER — Encounter: Payer: Self-pay | Admitting: Cardiovascular Disease

## 2011-04-28 ENCOUNTER — Encounter (HOSPITAL_COMMUNITY): Admission: RE | Admit: 2011-04-28 | Payer: BC Managed Care – PPO | Source: Ambulatory Visit

## 2011-04-28 ENCOUNTER — Ambulatory Visit (HOSPITAL_COMMUNITY): Payer: Self-pay

## 2011-04-29 ENCOUNTER — Encounter (HOSPITAL_COMMUNITY): Payer: BC Managed Care – PPO

## 2011-04-29 ENCOUNTER — Ambulatory Visit (HOSPITAL_COMMUNITY): Payer: Self-pay

## 2011-05-04 ENCOUNTER — Ambulatory Visit (HOSPITAL_COMMUNITY): Payer: Self-pay

## 2011-05-04 ENCOUNTER — Encounter (HOSPITAL_COMMUNITY): Payer: BC Managed Care – PPO

## 2011-05-05 ENCOUNTER — Encounter (HOSPITAL_COMMUNITY): Payer: BC Managed Care – PPO

## 2011-05-05 ENCOUNTER — Other Ambulatory Visit: Payer: Self-pay | Admitting: *Deleted

## 2011-05-05 ENCOUNTER — Ambulatory Visit (HOSPITAL_COMMUNITY): Payer: Self-pay

## 2011-05-05 MED ORDER — METOPROLOL SUCCINATE ER 100 MG PO TB24: 100.0000 mg | ORAL_TABLET | Freq: Every day | ORAL | Status: DC

## 2011-05-05 NOTE — Telephone Encounter (Signed)
Fax received from pharmacy. Refill completed. Jodette Concepcion Gillott RN  

## 2011-05-06 ENCOUNTER — Encounter (HOSPITAL_COMMUNITY): Payer: BC Managed Care – PPO

## 2011-05-06 ENCOUNTER — Ambulatory Visit (HOSPITAL_COMMUNITY): Payer: Self-pay

## 2011-05-11 ENCOUNTER — Encounter (HOSPITAL_COMMUNITY): Payer: BC Managed Care – PPO

## 2011-05-11 ENCOUNTER — Ambulatory Visit (HOSPITAL_COMMUNITY): Payer: Self-pay

## 2011-05-12 ENCOUNTER — Ambulatory Visit (HOSPITAL_COMMUNITY): Payer: Self-pay

## 2011-05-12 ENCOUNTER — Encounter (HOSPITAL_COMMUNITY): Payer: BC Managed Care – PPO | Attending: Cardiovascular Disease

## 2011-05-12 DIAGNOSIS — I471 Supraventricular tachycardia, unspecified: Secondary | ICD-10-CM | POA: Insufficient documentation

## 2011-05-12 DIAGNOSIS — I10 Essential (primary) hypertension: Secondary | ICD-10-CM | POA: Insufficient documentation

## 2011-05-12 DIAGNOSIS — I319 Disease of pericardium, unspecified: Secondary | ICD-10-CM | POA: Insufficient documentation

## 2011-05-12 DIAGNOSIS — Z8249 Family history of ischemic heart disease and other diseases of the circulatory system: Secondary | ICD-10-CM | POA: Insufficient documentation

## 2011-05-12 DIAGNOSIS — Z5189 Encounter for other specified aftercare: Secondary | ICD-10-CM | POA: Insufficient documentation

## 2011-05-12 DIAGNOSIS — I4891 Unspecified atrial fibrillation: Secondary | ICD-10-CM | POA: Insufficient documentation

## 2011-05-13 ENCOUNTER — Encounter (HOSPITAL_COMMUNITY): Payer: BC Managed Care – PPO

## 2011-05-13 ENCOUNTER — Ambulatory Visit (HOSPITAL_COMMUNITY): Payer: Self-pay

## 2011-05-13 ENCOUNTER — Ambulatory Visit (INDEPENDENT_AMBULATORY_CARE_PROVIDER_SITE_OTHER): Payer: BC Managed Care – PPO | Admitting: Cardiovascular Disease

## 2011-05-13 ENCOUNTER — Encounter: Payer: Self-pay | Admitting: Cardiovascular Disease

## 2011-05-13 VITALS — BP 122/80 | HR 64 | Ht 63.0 in | Wt 179.0 lb

## 2011-05-13 DIAGNOSIS — I471 Supraventricular tachycardia: Secondary | ICD-10-CM

## 2011-05-13 DIAGNOSIS — I10 Essential (primary) hypertension: Secondary | ICD-10-CM | POA: Insufficient documentation

## 2011-05-13 DIAGNOSIS — I4891 Unspecified atrial fibrillation: Secondary | ICD-10-CM | POA: Insufficient documentation

## 2011-05-13 DIAGNOSIS — M109 Gout, unspecified: Secondary | ICD-10-CM

## 2011-05-13 DIAGNOSIS — I311 Chronic constrictive pericarditis: Secondary | ICD-10-CM

## 2011-05-13 DIAGNOSIS — E785 Hyperlipidemia, unspecified: Secondary | ICD-10-CM

## 2011-05-13 DIAGNOSIS — I498 Other specified cardiac arrhythmias: Secondary | ICD-10-CM

## 2011-05-13 MED ORDER — METOPROLOL SUCCINATE ER 25 MG PO TB24: 75.0000 mg | ORAL_TABLET | Freq: Every day | ORAL | Status: DC

## 2011-05-13 MED ORDER — SPIRONOLACTONE 25 MG PO TABS: 75.0000 mg | ORAL_TABLET | Freq: Every day | ORAL | Status: DC

## 2011-05-13 MED ORDER — TORSEMIDE 20 MG PO TABS: 20.0000 mg | ORAL_TABLET | Freq: Every day | ORAL | Status: DC

## 2011-05-13 MED ORDER — INDOMETHACIN 25 MG PO CAPS: 50.0000 mg | ORAL_CAPSULE | Freq: Two times a day (BID) | ORAL | Status: AC

## 2011-05-13 NOTE — Assessment & Plan Note (Signed)
[name] not had any recurrent issues with congestive heart failure.

## 2011-05-13 NOTE — Assessment & Plan Note (Signed)
She remains in normal sinus rhythm.

## 2011-05-13 NOTE — Progress Notes (Signed)
Yolanda Bell Date of Birth  10-Jun-1941 [name] [address]     [address] [place], [place]  [address] [phone]  Fax  [phone]  History of Present Illness:  Yolanda Bell is a 70 year old female with a history of constrictive pericarditis. 2 status post pericardial stripping at [facility]. She also has a history of atrial fibrillation and status post cartilage. She has a history of supraventricular tachycardia status post RF ablation by Dr. Anabel Halon.  She denies any chest pain or shortness of breath. She complains of some pain in her right knee.  She wants start taking some Osteo Bi-Flex.  Current Outpatient Prescriptions on File Prior to Visit  Medication Sig Dispense Refill  . metoprolol (TOPROL XL) 100 MG 24 hr tablet Take 1 tablet (100 mg total) by mouth daily.  90 tablet  1    Allergies  Allergen Reactions  . Azithromycin     Past Medical History  Diagnosis Date  . Constrictive pericarditis     2 status post pericardial stripping at the [facility]  . Atrial fibrillation     Status post TEE cardioversion  . Spastic dysphonia   . Supraventricular tachycardia     Status post RF ablation by Dr. Anabel Halon    No past surgical history on file.  History  Smoking status  . Never Smoker   Smokeless tobacco  . Not on file    History  Alcohol Use: Not on file    No family history on file.  Reviw of Systems:  Reviewed in the HPI.  All other systems are negative.  Physical Exam: BP 122/80  Pulse 64  Ht 5\' 3"  (1.6 m)  Wt 179 lb (81.194 kg)  BMI 31.71 kg/m2 The patient is alert and oriented x 3.  The mood and affect are normal.   Skin: warm and dry.  Color is normal.    HEENT:   the sclera are nonicteric.  The mucous membranes are moist.  The carotids are 2+ without bruits.  There is no thyromegaly.  There is no JVD.    Lungs: clear.  The chest wall is non tender.    Heart: regular rate with a normal S1 and S2.   There are no murmurs, gallops, or rubs. The PMI is not displaced.     Abdomen: good bowel sounds.  There is no guarding or rebound.  There is no hepatosplenomegaly or tenderness.  There are no masses.   Extremities:  no clubbing, cyanosis, or edema.  The legs are without rashes.  The distal pulses are intact.   Neuro:  Cranial nerves II - XII are intact.  Motor and sensory functions are intact.    The gait is normal.  ECG:  Assessment / Plan:

## 2011-05-13 NOTE — Assessment & Plan Note (Signed)
At her request I've given her a refill on her Indocin. She has taken colchicine in the past that caused significant diarrhea and she does not want to take it.

## 2011-05-13 NOTE — Assessment & Plan Note (Signed)
She is doing very well. Her blood pressure has been well controlled. We'll continue with her current medications

## 2011-05-18 ENCOUNTER — Ambulatory Visit (HOSPITAL_COMMUNITY): Payer: Self-pay

## 2011-05-18 ENCOUNTER — Encounter (HOSPITAL_COMMUNITY): Payer: BC Managed Care – PPO

## 2011-05-19 ENCOUNTER — Encounter (HOSPITAL_COMMUNITY): Payer: BC Managed Care – PPO

## 2011-05-19 ENCOUNTER — Ambulatory Visit (HOSPITAL_COMMUNITY): Payer: Self-pay

## 2011-05-20 ENCOUNTER — Encounter (HOSPITAL_COMMUNITY): Payer: BC Managed Care – PPO

## 2011-05-20 ENCOUNTER — Ambulatory Visit (HOSPITAL_COMMUNITY): Payer: Self-pay

## 2011-05-24 ENCOUNTER — Other Ambulatory Visit: Payer: Self-pay | Admitting: *Deleted

## 2011-05-24 DIAGNOSIS — I10 Essential (primary) hypertension: Secondary | ICD-10-CM

## 2011-05-24 MED ORDER — METOPROLOL SUCCINATE ER 100 MG PO TB24: 100.0000 mg | ORAL_TABLET | Freq: Every day | ORAL | Status: DC

## 2011-05-24 NOTE — Progress Notes (Signed)
Verified strength with pharmacy and pt. Fax received from pharmacy. Refill completed. Alfonso Ramus RN

## 2011-05-25 ENCOUNTER — Encounter (HOSPITAL_COMMUNITY): Payer: BC Managed Care – PPO

## 2011-05-25 ENCOUNTER — Ambulatory Visit (HOSPITAL_COMMUNITY): Payer: Self-pay

## 2011-05-26 ENCOUNTER — Ambulatory Visit (HOSPITAL_COMMUNITY): Payer: Self-pay

## 2011-05-26 ENCOUNTER — Encounter (HOSPITAL_COMMUNITY): Payer: BC Managed Care – PPO

## 2011-05-27 ENCOUNTER — Ambulatory Visit (HOSPITAL_COMMUNITY): Payer: Self-pay

## 2011-05-27 ENCOUNTER — Encounter (HOSPITAL_COMMUNITY): Payer: BC Managed Care – PPO

## 2011-06-01 ENCOUNTER — Ambulatory Visit (HOSPITAL_COMMUNITY): Payer: Self-pay

## 2011-06-01 ENCOUNTER — Encounter (HOSPITAL_COMMUNITY): Admission: RE | Admit: 2011-06-01 | Payer: BC Managed Care – PPO | Source: Ambulatory Visit

## 2011-06-02 ENCOUNTER — Ambulatory Visit (HOSPITAL_COMMUNITY): Payer: Self-pay

## 2011-06-02 ENCOUNTER — Encounter (HOSPITAL_COMMUNITY): Payer: BC Managed Care – PPO

## 2011-06-03 ENCOUNTER — Encounter (HOSPITAL_COMMUNITY): Payer: BC Managed Care – PPO

## 2011-06-03 ENCOUNTER — Ambulatory Visit (HOSPITAL_COMMUNITY): Payer: Self-pay

## 2011-06-08 ENCOUNTER — Encounter (HOSPITAL_COMMUNITY): Payer: BC Managed Care – PPO

## 2011-06-08 ENCOUNTER — Ambulatory Visit (HOSPITAL_COMMUNITY): Payer: Self-pay

## 2011-06-09 ENCOUNTER — Ambulatory Visit (HOSPITAL_COMMUNITY): Payer: Self-pay

## 2011-06-09 ENCOUNTER — Encounter (HOSPITAL_COMMUNITY): Payer: BC Managed Care – PPO

## 2011-06-10 ENCOUNTER — Encounter (HOSPITAL_COMMUNITY): Payer: BC Managed Care – PPO

## 2011-06-10 ENCOUNTER — Ambulatory Visit (HOSPITAL_COMMUNITY): Payer: Self-pay

## 2011-06-15 ENCOUNTER — Encounter (HOSPITAL_COMMUNITY): Payer: BC Managed Care – PPO | Attending: Cardiovascular Disease

## 2011-06-15 ENCOUNTER — Ambulatory Visit (HOSPITAL_COMMUNITY): Payer: Self-pay

## 2011-06-15 DIAGNOSIS — I471 Supraventricular tachycardia, unspecified: Secondary | ICD-10-CM | POA: Insufficient documentation

## 2011-06-15 DIAGNOSIS — I4891 Unspecified atrial fibrillation: Secondary | ICD-10-CM | POA: Insufficient documentation

## 2011-06-15 DIAGNOSIS — I319 Disease of pericardium, unspecified: Secondary | ICD-10-CM | POA: Insufficient documentation

## 2011-06-15 DIAGNOSIS — I10 Essential (primary) hypertension: Secondary | ICD-10-CM | POA: Insufficient documentation

## 2011-06-15 DIAGNOSIS — Z5189 Encounter for other specified aftercare: Secondary | ICD-10-CM | POA: Insufficient documentation

## 2011-06-15 DIAGNOSIS — Z8249 Family history of ischemic heart disease and other diseases of the circulatory system: Secondary | ICD-10-CM | POA: Insufficient documentation

## 2011-06-16 ENCOUNTER — Ambulatory Visit (HOSPITAL_COMMUNITY): Payer: Self-pay

## 2011-06-16 ENCOUNTER — Encounter (HOSPITAL_COMMUNITY): Payer: BC Managed Care – PPO

## 2011-06-17 ENCOUNTER — Ambulatory Visit (HOSPITAL_COMMUNITY): Payer: Self-pay

## 2011-06-17 ENCOUNTER — Encounter (HOSPITAL_COMMUNITY): Payer: BC Managed Care – PPO

## 2011-06-22 ENCOUNTER — Ambulatory Visit (HOSPITAL_COMMUNITY): Payer: Self-pay

## 2011-06-22 ENCOUNTER — Encounter (HOSPITAL_COMMUNITY): Payer: BC Managed Care – PPO

## 2011-06-23 ENCOUNTER — Encounter (HOSPITAL_COMMUNITY): Payer: BC Managed Care – PPO

## 2011-06-23 ENCOUNTER — Ambulatory Visit (HOSPITAL_COMMUNITY): Payer: Self-pay

## 2011-06-24 ENCOUNTER — Ambulatory Visit (HOSPITAL_COMMUNITY): Payer: Self-pay

## 2011-06-24 ENCOUNTER — Encounter (HOSPITAL_COMMUNITY): Payer: BC Managed Care – PPO

## 2011-06-29 ENCOUNTER — Encounter (HOSPITAL_COMMUNITY): Payer: BC Managed Care – PPO

## 2011-06-29 ENCOUNTER — Ambulatory Visit (HOSPITAL_COMMUNITY): Payer: Self-pay

## 2011-06-30 ENCOUNTER — Ambulatory Visit (HOSPITAL_COMMUNITY): Payer: Self-pay

## 2011-06-30 ENCOUNTER — Encounter (HOSPITAL_COMMUNITY): Payer: BC Managed Care – PPO

## 2011-07-01 ENCOUNTER — Ambulatory Visit (HOSPITAL_COMMUNITY): Payer: Self-pay

## 2011-07-01 ENCOUNTER — Encounter (HOSPITAL_COMMUNITY): Payer: BC Managed Care – PPO

## 2011-07-01 LAB — APTT: aPTT: 43 — ABNORMAL HIGH

## 2011-07-01 LAB — BASIC METABOLIC PANEL
Calcium: 9.5
Chloride: 100
GFR calc Af Amer: >60
GFR calc non Af Amer: >60
Sodium: 137

## 2011-07-01 LAB — CBC
HCT: 39.3
Hemoglobin: 13.6
MCHC: 34.5
Platelets: 193

## 2011-07-06 ENCOUNTER — Ambulatory Visit (HOSPITAL_COMMUNITY): Payer: Self-pay

## 2011-07-06 ENCOUNTER — Encounter (HOSPITAL_COMMUNITY): Admission: RE | Admit: 2011-07-06 | Payer: BC Managed Care – PPO | Source: Ambulatory Visit

## 2011-07-07 ENCOUNTER — Ambulatory Visit (HOSPITAL_COMMUNITY): Payer: Self-pay

## 2011-07-07 ENCOUNTER — Encounter (HOSPITAL_COMMUNITY): Payer: BC Managed Care – PPO

## 2011-07-08 ENCOUNTER — Encounter (HOSPITAL_COMMUNITY): Payer: BC Managed Care – PPO

## 2011-07-08 ENCOUNTER — Ambulatory Visit (HOSPITAL_COMMUNITY): Payer: Self-pay

## 2011-07-13 ENCOUNTER — Encounter (HOSPITAL_COMMUNITY): Payer: BC Managed Care – PPO | Attending: Cardiovascular Disease

## 2011-07-13 ENCOUNTER — Ambulatory Visit (HOSPITAL_COMMUNITY): Payer: Self-pay

## 2011-07-13 DIAGNOSIS — I319 Disease of pericardium, unspecified: Secondary | ICD-10-CM | POA: Insufficient documentation

## 2011-07-13 DIAGNOSIS — I471 Supraventricular tachycardia, unspecified: Secondary | ICD-10-CM | POA: Insufficient documentation

## 2011-07-13 DIAGNOSIS — I4891 Unspecified atrial fibrillation: Secondary | ICD-10-CM | POA: Insufficient documentation

## 2011-07-13 DIAGNOSIS — Z8249 Family history of ischemic heart disease and other diseases of the circulatory system: Secondary | ICD-10-CM | POA: Insufficient documentation

## 2011-07-13 DIAGNOSIS — Z5189 Encounter for other specified aftercare: Secondary | ICD-10-CM | POA: Insufficient documentation

## 2011-07-13 DIAGNOSIS — I10 Essential (primary) hypertension: Secondary | ICD-10-CM | POA: Insufficient documentation

## 2011-07-14 ENCOUNTER — Ambulatory Visit (HOSPITAL_COMMUNITY): Payer: Self-pay

## 2011-07-14 ENCOUNTER — Encounter (HOSPITAL_COMMUNITY): Payer: BC Managed Care – PPO

## 2011-07-15 ENCOUNTER — Ambulatory Visit (HOSPITAL_COMMUNITY): Payer: Self-pay

## 2011-07-15 ENCOUNTER — Encounter (HOSPITAL_COMMUNITY): Payer: BC Managed Care – PPO

## 2011-07-20 ENCOUNTER — Ambulatory Visit (HOSPITAL_COMMUNITY): Payer: Self-pay

## 2011-07-20 ENCOUNTER — Encounter (HOSPITAL_COMMUNITY): Payer: BC Managed Care – PPO

## 2011-07-21 ENCOUNTER — Encounter (HOSPITAL_COMMUNITY): Payer: BC Managed Care – PPO

## 2011-07-21 ENCOUNTER — Ambulatory Visit (HOSPITAL_COMMUNITY): Payer: Self-pay

## 2011-07-22 ENCOUNTER — Ambulatory Visit (HOSPITAL_COMMUNITY): Payer: Self-pay

## 2011-07-22 ENCOUNTER — Encounter (HOSPITAL_COMMUNITY): Payer: BC Managed Care – PPO

## 2011-07-27 ENCOUNTER — Ambulatory Visit (HOSPITAL_COMMUNITY): Payer: Self-pay

## 2011-07-27 ENCOUNTER — Encounter (HOSPITAL_COMMUNITY): Admission: RE | Admit: 2011-07-27 | Payer: BC Managed Care – PPO | Source: Ambulatory Visit

## 2011-07-28 ENCOUNTER — Encounter (HOSPITAL_COMMUNITY): Payer: BC Managed Care – PPO

## 2011-07-28 ENCOUNTER — Ambulatory Visit (HOSPITAL_COMMUNITY): Payer: Self-pay

## 2011-07-29 ENCOUNTER — Encounter (HOSPITAL_COMMUNITY): Payer: BC Managed Care – PPO

## 2011-07-29 ENCOUNTER — Ambulatory Visit (HOSPITAL_COMMUNITY): Payer: Self-pay

## 2011-08-03 ENCOUNTER — Encounter (HOSPITAL_COMMUNITY): Payer: BC Managed Care – PPO

## 2011-08-03 ENCOUNTER — Ambulatory Visit (HOSPITAL_COMMUNITY): Payer: Self-pay

## 2011-08-04 ENCOUNTER — Encounter (HOSPITAL_COMMUNITY): Payer: BC Managed Care – PPO

## 2011-08-04 ENCOUNTER — Ambulatory Visit (HOSPITAL_COMMUNITY): Payer: Self-pay

## 2011-08-05 ENCOUNTER — Encounter (HOSPITAL_COMMUNITY): Payer: BC Managed Care – PPO

## 2011-08-05 ENCOUNTER — Ambulatory Visit (HOSPITAL_COMMUNITY): Payer: Self-pay

## 2011-08-10 ENCOUNTER — Encounter (HOSPITAL_COMMUNITY): Payer: BC Managed Care – PPO

## 2011-08-10 ENCOUNTER — Ambulatory Visit (HOSPITAL_COMMUNITY): Payer: Self-pay

## 2011-08-11 ENCOUNTER — Ambulatory Visit (HOSPITAL_COMMUNITY): Payer: Self-pay

## 2011-08-11 ENCOUNTER — Encounter (HOSPITAL_COMMUNITY): Payer: BC Managed Care – PPO

## 2011-08-12 ENCOUNTER — Encounter (HOSPITAL_COMMUNITY): Payer: Medicare Other

## 2011-08-12 ENCOUNTER — Ambulatory Visit (HOSPITAL_COMMUNITY): Payer: Self-pay

## 2011-08-17 ENCOUNTER — Encounter (HOSPITAL_COMMUNITY): Payer: Medicare Other

## 2011-08-17 ENCOUNTER — Ambulatory Visit (HOSPITAL_COMMUNITY): Payer: Self-pay

## 2011-08-17 DIAGNOSIS — I471 Supraventricular tachycardia, unspecified: Secondary | ICD-10-CM | POA: Insufficient documentation

## 2011-08-17 DIAGNOSIS — Z5189 Encounter for other specified aftercare: Secondary | ICD-10-CM | POA: Insufficient documentation

## 2011-08-17 DIAGNOSIS — I319 Disease of pericardium, unspecified: Secondary | ICD-10-CM | POA: Insufficient documentation

## 2011-08-17 DIAGNOSIS — I10 Essential (primary) hypertension: Secondary | ICD-10-CM | POA: Insufficient documentation

## 2011-08-17 DIAGNOSIS — I4891 Unspecified atrial fibrillation: Secondary | ICD-10-CM | POA: Insufficient documentation

## 2011-08-17 DIAGNOSIS — Z8249 Family history of ischemic heart disease and other diseases of the circulatory system: Secondary | ICD-10-CM | POA: Insufficient documentation

## 2011-08-18 ENCOUNTER — Encounter (HOSPITAL_COMMUNITY): Payer: Medicare Other

## 2011-08-18 ENCOUNTER — Ambulatory Visit (HOSPITAL_COMMUNITY): Payer: Self-pay

## 2011-08-19 ENCOUNTER — Ambulatory Visit (HOSPITAL_COMMUNITY): Payer: Self-pay

## 2011-08-19 ENCOUNTER — Encounter (HOSPITAL_COMMUNITY): Payer: Medicare Other

## 2011-08-24 ENCOUNTER — Encounter (HOSPITAL_COMMUNITY): Payer: Medicare Other

## 2011-08-24 ENCOUNTER — Ambulatory Visit (HOSPITAL_COMMUNITY): Payer: Self-pay

## 2011-08-25 ENCOUNTER — Ambulatory Visit (HOSPITAL_COMMUNITY): Payer: Self-pay

## 2011-08-25 ENCOUNTER — Encounter (HOSPITAL_COMMUNITY): Payer: Medicare Other

## 2011-08-26 ENCOUNTER — Encounter (HOSPITAL_COMMUNITY): Payer: Medicare Other

## 2011-08-26 ENCOUNTER — Ambulatory Visit (HOSPITAL_COMMUNITY): Payer: Self-pay

## 2011-08-31 ENCOUNTER — Ambulatory Visit (HOSPITAL_COMMUNITY): Payer: Self-pay

## 2011-08-31 ENCOUNTER — Encounter (HOSPITAL_COMMUNITY)
Admission: RE | Admit: 2011-08-31 | Discharge: 2011-08-31 | Disposition: A | Discharge status: Home / Self Care | Payer: Medicare Other | Source: Ambulatory Visit | Attending: Cardiovascular Disease | Admitting: Cardiovascular Disease

## 2011-09-01 ENCOUNTER — Encounter (HOSPITAL_COMMUNITY)
Admission: RE | Admit: 2011-09-01 | Discharge: 2011-09-01 | Disposition: A | Discharge status: Home / Self Care | Payer: Medicare Other | Source: Ambulatory Visit | Attending: Cardiovascular Disease | Admitting: Cardiovascular Disease

## 2011-09-01 ENCOUNTER — Ambulatory Visit (HOSPITAL_COMMUNITY): Payer: Self-pay

## 2011-09-02 ENCOUNTER — Ambulatory Visit (HOSPITAL_COMMUNITY): Payer: Self-pay

## 2011-09-02 ENCOUNTER — Encounter (HOSPITAL_COMMUNITY): Payer: Medicare Other

## 2011-09-07 ENCOUNTER — Ambulatory Visit (HOSPITAL_COMMUNITY): Payer: Self-pay

## 2011-09-07 ENCOUNTER — Encounter (HOSPITAL_COMMUNITY)
Admission: RE | Admit: 2011-09-07 | Discharge: 2011-09-07 | Disposition: A | Discharge status: Home / Self Care | Payer: Medicare Other | Source: Ambulatory Visit | Attending: Cardiovascular Disease | Admitting: Cardiovascular Disease

## 2011-09-08 ENCOUNTER — Encounter (HOSPITAL_COMMUNITY): Payer: Medicare Other

## 2011-09-08 ENCOUNTER — Ambulatory Visit (HOSPITAL_COMMUNITY): Payer: Self-pay

## 2011-09-09 ENCOUNTER — Encounter (HOSPITAL_COMMUNITY): Payer: Medicare Other

## 2011-09-09 ENCOUNTER — Ambulatory Visit (HOSPITAL_COMMUNITY): Payer: Self-pay

## 2011-09-14 ENCOUNTER — Ambulatory Visit (HOSPITAL_COMMUNITY): Payer: Self-pay

## 2011-09-14 ENCOUNTER — Encounter (HOSPITAL_COMMUNITY)
Admission: RE | Admit: 2011-09-14 | Discharge: 2011-09-14 | Disposition: A | Discharge status: Home / Self Care | Payer: BC Managed Care – PPO | Source: Ambulatory Visit | Attending: Cardiovascular Disease | Admitting: Cardiovascular Disease

## 2011-09-14 DIAGNOSIS — Z5189 Encounter for other specified aftercare: Secondary | ICD-10-CM | POA: Insufficient documentation

## 2011-09-14 DIAGNOSIS — I319 Disease of pericardium, unspecified: Secondary | ICD-10-CM | POA: Insufficient documentation

## 2011-09-14 DIAGNOSIS — I471 Supraventricular tachycardia, unspecified: Secondary | ICD-10-CM | POA: Insufficient documentation

## 2011-09-14 DIAGNOSIS — Z8249 Family history of ischemic heart disease and other diseases of the circulatory system: Secondary | ICD-10-CM | POA: Insufficient documentation

## 2011-09-14 DIAGNOSIS — I10 Essential (primary) hypertension: Secondary | ICD-10-CM | POA: Insufficient documentation

## 2011-09-14 DIAGNOSIS — I4891 Unspecified atrial fibrillation: Secondary | ICD-10-CM | POA: Insufficient documentation

## 2011-09-15 ENCOUNTER — Ambulatory Visit (HOSPITAL_COMMUNITY): Payer: Self-pay

## 2011-09-15 ENCOUNTER — Encounter (HOSPITAL_COMMUNITY)
Admission: RE | Admit: 2011-09-15 | Discharge: 2011-09-15 | Disposition: A | Discharge status: Home / Self Care | Payer: BC Managed Care – PPO | Source: Ambulatory Visit | Attending: Cardiovascular Disease | Admitting: Cardiovascular Disease

## 2011-09-16 ENCOUNTER — Ambulatory Visit (HOSPITAL_COMMUNITY): Payer: Self-pay

## 2011-09-16 ENCOUNTER — Encounter (HOSPITAL_COMMUNITY)
Admission: RE | Admit: 2011-09-16 | Discharge: 2011-09-16 | Disposition: A | Discharge status: Home / Self Care | Payer: BC Managed Care – PPO | Source: Ambulatory Visit | Attending: Cardiovascular Disease | Admitting: Cardiovascular Disease

## 2011-09-21 ENCOUNTER — Encounter (HOSPITAL_COMMUNITY): Payer: BC Managed Care – PPO

## 2011-09-21 ENCOUNTER — Ambulatory Visit (HOSPITAL_COMMUNITY): Payer: Self-pay

## 2011-09-22 ENCOUNTER — Encounter (HOSPITAL_COMMUNITY): Payer: BC Managed Care – PPO

## 2011-09-22 ENCOUNTER — Ambulatory Visit (HOSPITAL_COMMUNITY): Payer: Self-pay

## 2011-09-23 ENCOUNTER — Ambulatory Visit (HOSPITAL_COMMUNITY): Payer: Self-pay

## 2011-09-23 ENCOUNTER — Encounter (HOSPITAL_COMMUNITY): Payer: BC Managed Care – PPO

## 2011-09-28 ENCOUNTER — Ambulatory Visit (HOSPITAL_COMMUNITY): Payer: Self-pay

## 2011-09-28 ENCOUNTER — Encounter (HOSPITAL_COMMUNITY): Payer: BC Managed Care – PPO

## 2011-09-29 ENCOUNTER — Encounter (HOSPITAL_COMMUNITY): Payer: BC Managed Care – PPO

## 2011-09-29 ENCOUNTER — Ambulatory Visit (HOSPITAL_COMMUNITY): Payer: Self-pay

## 2011-09-30 ENCOUNTER — Ambulatory Visit (HOSPITAL_COMMUNITY): Payer: Self-pay

## 2011-09-30 ENCOUNTER — Encounter (HOSPITAL_COMMUNITY): Payer: BC Managed Care – PPO

## 2011-10-05 ENCOUNTER — Encounter (HOSPITAL_COMMUNITY): Payer: BC Managed Care – PPO

## 2011-10-05 ENCOUNTER — Ambulatory Visit (HOSPITAL_COMMUNITY): Payer: Self-pay

## 2011-10-06 ENCOUNTER — Encounter (HOSPITAL_COMMUNITY): Payer: BC Managed Care – PPO

## 2011-10-06 ENCOUNTER — Ambulatory Visit (HOSPITAL_COMMUNITY): Payer: Self-pay

## 2011-10-07 ENCOUNTER — Encounter (HOSPITAL_COMMUNITY): Payer: BC Managed Care – PPO

## 2011-10-07 ENCOUNTER — Ambulatory Visit (HOSPITAL_COMMUNITY): Payer: Self-pay

## 2011-10-12 ENCOUNTER — Ambulatory Visit (HOSPITAL_COMMUNITY): Payer: Self-pay

## 2011-10-12 ENCOUNTER — Encounter (HOSPITAL_COMMUNITY): Payer: BC Managed Care – PPO

## 2011-10-13 ENCOUNTER — Encounter (HOSPITAL_COMMUNITY): Payer: BC Managed Care – PPO

## 2011-10-13 ENCOUNTER — Ambulatory Visit (HOSPITAL_COMMUNITY): Payer: Self-pay

## 2011-10-14 ENCOUNTER — Ambulatory Visit (HOSPITAL_COMMUNITY): Payer: Self-pay

## 2011-10-14 ENCOUNTER — Encounter (HOSPITAL_COMMUNITY)
Admission: RE | Admit: 2011-10-14 | Discharge: 2011-10-14 | Disposition: A | Discharge status: Home / Self Care | Payer: BC Managed Care – PPO | Source: Ambulatory Visit | Attending: Cardiovascular Disease | Admitting: Cardiovascular Disease

## 2011-10-14 DIAGNOSIS — Z8249 Family history of ischemic heart disease and other diseases of the circulatory system: Secondary | ICD-10-CM | POA: Insufficient documentation

## 2011-10-14 DIAGNOSIS — I471 Supraventricular tachycardia, unspecified: Secondary | ICD-10-CM | POA: Insufficient documentation

## 2011-10-14 DIAGNOSIS — I319 Disease of pericardium, unspecified: Secondary | ICD-10-CM | POA: Insufficient documentation

## 2011-10-14 DIAGNOSIS — I10 Essential (primary) hypertension: Secondary | ICD-10-CM | POA: Insufficient documentation

## 2011-10-14 DIAGNOSIS — I4891 Unspecified atrial fibrillation: Secondary | ICD-10-CM | POA: Insufficient documentation

## 2011-10-14 DIAGNOSIS — Z5189 Encounter for other specified aftercare: Secondary | ICD-10-CM | POA: Insufficient documentation

## 2011-10-19 ENCOUNTER — Encounter (HOSPITAL_COMMUNITY): Payer: BC Managed Care – PPO

## 2011-10-19 ENCOUNTER — Ambulatory Visit (HOSPITAL_COMMUNITY): Payer: Self-pay

## 2011-10-20 ENCOUNTER — Encounter (HOSPITAL_COMMUNITY)
Admission: RE | Admit: 2011-10-20 | Discharge: 2011-10-20 | Disposition: A | Discharge status: Home / Self Care | Payer: BC Managed Care – PPO | Source: Ambulatory Visit | Attending: Cardiovascular Disease | Admitting: Cardiovascular Disease

## 2011-10-20 ENCOUNTER — Ambulatory Visit (HOSPITAL_COMMUNITY): Payer: Self-pay

## 2011-10-21 ENCOUNTER — Encounter (HOSPITAL_COMMUNITY)
Admission: RE | Admit: 2011-10-21 | Discharge: 2011-10-21 | Disposition: A | Discharge status: Home / Self Care | Payer: BC Managed Care – PPO | Source: Ambulatory Visit | Attending: Cardiovascular Disease | Admitting: Cardiovascular Disease

## 2011-10-21 ENCOUNTER — Ambulatory Visit (HOSPITAL_COMMUNITY): Payer: Self-pay

## 2011-10-26 ENCOUNTER — Encounter (HOSPITAL_COMMUNITY)
Admission: RE | Admit: 2011-10-26 | Discharge: 2011-10-26 | Disposition: A | Discharge status: Home / Self Care | Payer: BC Managed Care – PPO | Source: Ambulatory Visit | Attending: Cardiovascular Disease | Admitting: Cardiovascular Disease

## 2011-10-26 ENCOUNTER — Ambulatory Visit (HOSPITAL_COMMUNITY): Payer: Self-pay

## 2011-10-27 ENCOUNTER — Encounter (HOSPITAL_COMMUNITY)
Admission: RE | Admit: 2011-10-27 | Discharge: 2011-10-27 | Disposition: A | Discharge status: Home / Self Care | Payer: BC Managed Care – PPO | Source: Ambulatory Visit | Attending: Cardiovascular Disease | Admitting: Cardiovascular Disease

## 2011-10-27 ENCOUNTER — Ambulatory Visit (HOSPITAL_COMMUNITY): Payer: Self-pay

## 2011-10-28 ENCOUNTER — Encounter (HOSPITAL_COMMUNITY): Payer: BC Managed Care – PPO

## 2011-10-28 ENCOUNTER — Ambulatory Visit (HOSPITAL_COMMUNITY): Payer: Self-pay

## 2011-11-02 ENCOUNTER — Ambulatory Visit (HOSPITAL_COMMUNITY): Payer: Self-pay

## 2011-11-02 ENCOUNTER — Encounter (HOSPITAL_COMMUNITY): Payer: BC Managed Care – PPO

## 2011-11-03 ENCOUNTER — Encounter (HOSPITAL_COMMUNITY)
Admission: RE | Admit: 2011-11-03 | Discharge: 2011-11-03 | Disposition: A | Discharge status: Home / Self Care | Payer: BC Managed Care – PPO | Source: Ambulatory Visit | Attending: Cardiovascular Disease | Admitting: Cardiovascular Disease

## 2011-11-03 ENCOUNTER — Ambulatory Visit (HOSPITAL_COMMUNITY): Payer: Self-pay

## 2011-11-04 ENCOUNTER — Encounter (HOSPITAL_COMMUNITY): Payer: BC Managed Care – PPO

## 2011-11-04 ENCOUNTER — Ambulatory Visit (HOSPITAL_COMMUNITY): Payer: Self-pay

## 2011-11-09 ENCOUNTER — Encounter (HOSPITAL_COMMUNITY)
Admission: RE | Admit: 2011-11-09 | Discharge: 2011-11-09 | Disposition: A | Discharge status: Home / Self Care | Payer: BC Managed Care – PPO | Source: Ambulatory Visit | Attending: Cardiovascular Disease | Admitting: Cardiovascular Disease

## 2011-11-09 ENCOUNTER — Ambulatory Visit (HOSPITAL_COMMUNITY): Payer: Self-pay

## 2011-11-10 ENCOUNTER — Encounter (HOSPITAL_COMMUNITY)
Admission: RE | Admit: 2011-11-10 | Discharge: 2011-11-10 | Disposition: A | Discharge status: Home / Self Care | Payer: BC Managed Care – PPO | Source: Ambulatory Visit | Attending: Cardiovascular Disease | Admitting: Cardiovascular Disease

## 2011-11-10 ENCOUNTER — Ambulatory Visit (HOSPITAL_COMMUNITY): Payer: Self-pay

## 2011-11-11 ENCOUNTER — Ambulatory Visit (HOSPITAL_COMMUNITY): Payer: Self-pay

## 2011-11-11 ENCOUNTER — Ambulatory Visit (INDEPENDENT_AMBULATORY_CARE_PROVIDER_SITE_OTHER): Payer: Medicare Other | Admitting: Cardiovascular Disease

## 2011-11-11 ENCOUNTER — Encounter: Payer: Self-pay | Admitting: Cardiovascular Disease

## 2011-11-11 ENCOUNTER — Encounter (HOSPITAL_COMMUNITY): Payer: BC Managed Care – PPO

## 2011-11-11 DIAGNOSIS — I4891 Unspecified atrial fibrillation: Secondary | ICD-10-CM

## 2011-11-11 DIAGNOSIS — E785 Hyperlipidemia, unspecified: Secondary | ICD-10-CM

## 2011-11-11 DIAGNOSIS — M109 Gout, unspecified: Secondary | ICD-10-CM

## 2011-11-11 LAB — LIPID PANEL
Cholesterol: 184 mg/dL (ref 0–200)
Total CHOL/HDL Ratio: 4
Triglycerides: 112 mg/dL (ref 0.0–149.0)
VLDL: 22.4 mg/dL (ref 0.0–40.0)

## 2011-11-11 LAB — BASIC METABOLIC PANEL
Calcium: 9.4 mg/dL (ref 8.4–10.5)
Creatinine, Ser: 0.9 mg/dL (ref 0.4–1.2)
GFR: 68.64 mL/min (ref >60.00)

## 2011-11-11 LAB — HEPATIC FUNCTION PANEL
ALT: 19 U/L (ref 0–35)
Alkaline Phosphatase: 80 U/L (ref 39–117)
Bilirubin, Direct: 0.1 mg/dL (ref 0.0–0.3)
Total Protein: 7.4 g/dL (ref 6.0–8.3)

## 2011-11-11 NOTE — Patient Instructions (Signed)
Your physician wants you to follow-up in: 6 months You will receive a reminder letter in the mail two months in advance. If you don't receive a letter, please call our office to schedule the follow-up appointment.   Your physician recommends that you return for a FASTING lipid profile: 6 months and today  

## 2011-11-11 NOTE — Progress Notes (Signed)
    [name] Date of Birth  01-23-1941 [facility]      Office  [address]    [address]   [address] [place], [place]  [address]    [place], [place]  [address] [phone]  Fax  [phone]  [phone]  Fax [phone]  Problems: 1.  Constrictive Pericarditis 2.  A-Flutter / Fibrillation 3.  Gout 4.  Spastic dysphonia 5. SVT  History of Present Illness:  She has done well from a cardiac standpoint.  She has had a flare up of gout which has affected both feet.   She is not on alluurinol due to her cirrhosis.  She had significant diarrhea with colchicine.   No significant arrhythmias.    Current Outpatient Prescriptions on File Prior to Visit  Medication Sig Dispense Refill  . aspirin 325 MG tablet Take 325 mg by mouth daily.        . metoprolol succinate (TOPROL-XL) 100 MG 24 hr tablet Take 1 tablet (100 mg total) by mouth daily.  90 tablet  1  . spironolactone (ALDACTONE) 25 MG tablet Take 3 tablets (75 mg total) by mouth daily.  270 tablet  3  . torsemide (DEMADEX) 20 MG tablet Take 1 tablet (20 mg total) by mouth daily.  90 tablet  3    Allergies  Allergen Reactions  . Azithromycin     Past Medical History  Diagnosis Date  . Constrictive pericarditis     2 status post pericardial stripping at the [facility]  . Atrial fibrillation     Status post TEE cardioversion  . Spastic dysphonia   . Supraventricular tachycardia     Status post RF ablation by Dr. Anabel Halon    No past surgical history on file.  History  Smoking status  . Never Smoker   Smokeless tobacco  . Not on file    History  Alcohol Use: Not on file    No family history on file.  Reviw of Systems:  Reviewed in the HPI.  All other systems are negative.  Physical Exam: Blood pressure 139/77, pulse 57, height 5\' 4"  (1.626 m), weight 182 lb 12.8 oz (82.918 kg). General: Well developed, well nourished, in no acute distress.  Head: Normocephalic, atraumatic,  sclera non-icteric, mucus membranes are moist  Neck: Supple. Negative for carotid bruits. JVD not elevated.  Lungs: Clear bilaterally to auscultation without wheezes, rales, or rhonchi. Breathing is unlabored.  Heart: RRR with S1 S2. No murmurs, rubs, or gallops appreciated.  Abdomen: Soft, non-tender, non-distended with normoactive bowel sounds. No hepatomegaly. No rebound/guarding. No obvious abdominal masses.  Msk:  Strength and tone appear normal for age.  Extremities: No clubbing or cyanosis. No edema.  Distal pedal pulses are 2+ and equal bilaterally.  Neuro: Alert and oriented X 3. Moves all extremities spontaneously.  Psych:  Responds to questions appropriately with a normal affect.  ECG: Sinus bradycardia. She has nonspecific ST-T wave abnormalities.  Assessment / Plan:

## 2011-11-11 NOTE — Assessment & Plan Note (Signed)
Yolanda Bell is doing well. We'll continue with her same medications.

## 2011-11-16 ENCOUNTER — Other Ambulatory Visit: Payer: Self-pay | Admitting: Cardiovascular Disease

## 2011-11-16 ENCOUNTER — Encounter (HOSPITAL_COMMUNITY)
Admission: RE | Admit: 2011-11-16 | Discharge: 2011-11-16 | Disposition: A | Discharge status: Home / Self Care | Payer: BC Managed Care – PPO | Source: Ambulatory Visit | Attending: Cardiovascular Disease | Admitting: Cardiovascular Disease

## 2011-11-16 DIAGNOSIS — I10 Essential (primary) hypertension: Secondary | ICD-10-CM | POA: Insufficient documentation

## 2011-11-16 DIAGNOSIS — Z8249 Family history of ischemic heart disease and other diseases of the circulatory system: Secondary | ICD-10-CM | POA: Insufficient documentation

## 2011-11-16 DIAGNOSIS — I319 Disease of pericardium, unspecified: Secondary | ICD-10-CM | POA: Insufficient documentation

## 2011-11-16 DIAGNOSIS — Z5189 Encounter for other specified aftercare: Secondary | ICD-10-CM | POA: Insufficient documentation

## 2011-11-16 DIAGNOSIS — I4891 Unspecified atrial fibrillation: Secondary | ICD-10-CM | POA: Insufficient documentation

## 2011-11-16 DIAGNOSIS — I471 Supraventricular tachycardia, unspecified: Secondary | ICD-10-CM | POA: Insufficient documentation

## 2011-11-17 ENCOUNTER — Encounter (HOSPITAL_COMMUNITY): Payer: BC Managed Care – PPO

## 2011-11-18 ENCOUNTER — Encounter (HOSPITAL_COMMUNITY): Payer: BC Managed Care – PPO

## 2011-11-23 ENCOUNTER — Encounter (HOSPITAL_COMMUNITY): Payer: BC Managed Care – PPO

## 2011-11-24 ENCOUNTER — Encounter (HOSPITAL_COMMUNITY): Payer: BC Managed Care – PPO

## 2011-11-25 ENCOUNTER — Encounter (HOSPITAL_COMMUNITY): Payer: BC Managed Care – PPO

## 2011-11-30 ENCOUNTER — Encounter (HOSPITAL_COMMUNITY): Payer: BC Managed Care – PPO

## 2011-12-01 ENCOUNTER — Encounter (HOSPITAL_COMMUNITY): Payer: BC Managed Care – PPO

## 2011-12-02 ENCOUNTER — Encounter (HOSPITAL_COMMUNITY): Payer: BC Managed Care – PPO

## 2011-12-07 ENCOUNTER — Encounter (HOSPITAL_COMMUNITY): Payer: BC Managed Care – PPO

## 2011-12-08 ENCOUNTER — Encounter (HOSPITAL_COMMUNITY): Payer: BC Managed Care – PPO

## 2011-12-09 ENCOUNTER — Encounter (HOSPITAL_COMMUNITY): Payer: BC Managed Care – PPO

## 2011-12-14 ENCOUNTER — Encounter (HOSPITAL_COMMUNITY): Payer: BC Managed Care – PPO

## 2011-12-15 ENCOUNTER — Encounter (HOSPITAL_COMMUNITY): Payer: BC Managed Care – PPO

## 2011-12-16 ENCOUNTER — Encounter (HOSPITAL_COMMUNITY): Payer: BC Managed Care – PPO

## 2011-12-21 ENCOUNTER — Encounter (HOSPITAL_COMMUNITY): Payer: BC Managed Care – PPO

## 2011-12-22 ENCOUNTER — Encounter (HOSPITAL_COMMUNITY): Payer: BC Managed Care – PPO

## 2011-12-23 ENCOUNTER — Encounter (HOSPITAL_COMMUNITY): Payer: BC Managed Care – PPO

## 2011-12-28 ENCOUNTER — Encounter (HOSPITAL_COMMUNITY): Payer: BC Managed Care – PPO

## 2011-12-29 ENCOUNTER — Encounter (HOSPITAL_COMMUNITY): Payer: BC Managed Care – PPO

## 2011-12-30 ENCOUNTER — Encounter (HOSPITAL_COMMUNITY): Payer: BC Managed Care – PPO

## 2012-01-04 ENCOUNTER — Encounter (HOSPITAL_COMMUNITY): Payer: BC Managed Care – PPO

## 2012-01-05 ENCOUNTER — Encounter (HOSPITAL_COMMUNITY): Payer: BC Managed Care – PPO

## 2012-01-06 ENCOUNTER — Encounter (HOSPITAL_COMMUNITY): Payer: BC Managed Care – PPO

## 2012-01-11 ENCOUNTER — Encounter (HOSPITAL_COMMUNITY): Payer: BC Managed Care – PPO

## 2012-01-12 ENCOUNTER — Encounter (HOSPITAL_COMMUNITY): Payer: BC Managed Care – PPO

## 2012-01-13 ENCOUNTER — Encounter (HOSPITAL_COMMUNITY): Payer: BC Managed Care – PPO

## 2012-01-18 ENCOUNTER — Encounter (HOSPITAL_COMMUNITY): Payer: BC Managed Care – PPO

## 2012-01-19 ENCOUNTER — Encounter (HOSPITAL_COMMUNITY): Payer: BC Managed Care – PPO

## 2012-01-20 ENCOUNTER — Encounter (HOSPITAL_COMMUNITY): Payer: BC Managed Care – PPO

## 2012-01-25 ENCOUNTER — Encounter (HOSPITAL_COMMUNITY): Payer: BC Managed Care – PPO

## 2012-01-26 ENCOUNTER — Encounter (HOSPITAL_COMMUNITY): Payer: BC Managed Care – PPO

## 2012-01-27 ENCOUNTER — Encounter (HOSPITAL_COMMUNITY): Payer: BC Managed Care – PPO

## 2012-02-01 ENCOUNTER — Encounter (HOSPITAL_COMMUNITY): Payer: BC Managed Care – PPO

## 2012-02-02 ENCOUNTER — Encounter (HOSPITAL_COMMUNITY): Payer: BC Managed Care – PPO

## 2012-02-03 ENCOUNTER — Encounter (HOSPITAL_COMMUNITY): Payer: BC Managed Care – PPO

## 2012-02-08 ENCOUNTER — Encounter (HOSPITAL_COMMUNITY): Payer: BC Managed Care – PPO

## 2012-02-09 ENCOUNTER — Encounter (HOSPITAL_COMMUNITY): Payer: BC Managed Care – PPO

## 2012-02-10 ENCOUNTER — Encounter (HOSPITAL_COMMUNITY): Payer: BC Managed Care – PPO

## 2012-02-15 ENCOUNTER — Encounter (HOSPITAL_COMMUNITY): Payer: BC Managed Care – PPO

## 2012-02-16 ENCOUNTER — Encounter (HOSPITAL_COMMUNITY): Payer: BC Managed Care – PPO

## 2012-02-17 ENCOUNTER — Encounter (HOSPITAL_COMMUNITY): Payer: BC Managed Care – PPO

## 2012-02-22 ENCOUNTER — Encounter (HOSPITAL_COMMUNITY): Payer: BC Managed Care – PPO

## 2012-02-23 ENCOUNTER — Encounter (HOSPITAL_COMMUNITY): Payer: BC Managed Care – PPO

## 2012-02-24 ENCOUNTER — Encounter (HOSPITAL_COMMUNITY): Payer: BC Managed Care – PPO

## 2012-02-29 ENCOUNTER — Encounter (HOSPITAL_COMMUNITY): Payer: BC Managed Care – PPO

## 2012-03-01 ENCOUNTER — Encounter (HOSPITAL_COMMUNITY): Payer: BC Managed Care – PPO

## 2012-03-02 ENCOUNTER — Encounter (HOSPITAL_COMMUNITY): Payer: BC Managed Care – PPO

## 2012-03-07 ENCOUNTER — Encounter (HOSPITAL_COMMUNITY): Payer: BC Managed Care – PPO

## 2012-03-08 ENCOUNTER — Encounter (HOSPITAL_COMMUNITY): Payer: BC Managed Care – PPO

## 2012-03-09 ENCOUNTER — Encounter (HOSPITAL_COMMUNITY): Payer: BC Managed Care – PPO

## 2012-03-13 ENCOUNTER — Telehealth: Payer: Self-pay | Admitting: Cardiovascular Disease

## 2012-03-13 MED ORDER — PROPRANOLOL HCL 10 MG PO TABS: 10.0000 mg | ORAL_TABLET | Freq: Four times a day (QID) | ORAL | Status: DC | PRN

## 2012-03-13 NOTE — Telephone Encounter (Signed)
New Problem:    This weekend the patient's Heart Rate raced up to 136-145 and her heart fell out of rhythm and began fluttering.  She took some medication and her heart rate dropped back to normal and the fluttering stopped and she has been ok since but she is concerned what caused it.  Please call back.

## 2012-03-13 NOTE — Telephone Encounter (Signed)
Propranolol ordered, explained its use, pt verbalized understanding.

## 2012-03-13 NOTE — Telephone Encounter (Signed)
She has both SVT and A-fib.  She is on toprol 100.  We can add Propranolol 10 QID PRN to her meds.  I would hesitate to go up on her Metoprolol until I see her.  If she has more episodes, we should see her , otherwise keep OV as already scheduled.

## 2012-03-13 NOTE — Telephone Encounter (Signed)
Pt states her heart went out of rhythm Saturday night 03/11/12, bp 135/84- 120/70, HR 136-145, pt tried cough, bear down, cold water on face. Went back into rhythm last evening Sunday 03/12/12 and feeling better today, do you want to adjust meds- ov? Denies infection, pt concerned why it went back into rhythm without cardioversion.  Please advise.

## 2012-03-14 ENCOUNTER — Encounter (HOSPITAL_COMMUNITY): Payer: BC Managed Care – PPO

## 2012-03-15 ENCOUNTER — Encounter (HOSPITAL_COMMUNITY): Payer: BC Managed Care – PPO

## 2012-03-16 ENCOUNTER — Encounter (HOSPITAL_COMMUNITY): Payer: BC Managed Care – PPO

## 2012-03-21 ENCOUNTER — Encounter (HOSPITAL_COMMUNITY): Payer: BC Managed Care – PPO

## 2012-03-22 ENCOUNTER — Encounter (HOSPITAL_COMMUNITY): Payer: BC Managed Care – PPO

## 2012-03-23 ENCOUNTER — Encounter (HOSPITAL_COMMUNITY): Payer: BC Managed Care – PPO

## 2012-03-28 ENCOUNTER — Encounter (HOSPITAL_COMMUNITY): Payer: BC Managed Care – PPO

## 2012-03-29 ENCOUNTER — Encounter (HOSPITAL_COMMUNITY): Payer: BC Managed Care – PPO

## 2012-03-30 ENCOUNTER — Ambulatory Visit: Payer: Medicare Other | Attending: Orthopedic Surgery | Admitting: Physical Therapy

## 2012-03-30 ENCOUNTER — Encounter (HOSPITAL_COMMUNITY): Payer: BC Managed Care – PPO

## 2012-03-30 DIAGNOSIS — IMO0001 Reserved for inherently not codable concepts without codable children: Secondary | ICD-10-CM | POA: Insufficient documentation

## 2012-03-30 DIAGNOSIS — M25619 Stiffness of unspecified shoulder, not elsewhere classified: Secondary | ICD-10-CM | POA: Insufficient documentation

## 2012-03-30 DIAGNOSIS — M25519 Pain in unspecified shoulder: Secondary | ICD-10-CM | POA: Insufficient documentation

## 2012-03-31 ENCOUNTER — Ambulatory Visit: Payer: Medicare Other | Admitting: Physical Therapy

## 2012-04-03 ENCOUNTER — Ambulatory Visit: Payer: Medicare Other

## 2012-04-04 ENCOUNTER — Encounter (HOSPITAL_COMMUNITY): Payer: BC Managed Care – PPO

## 2012-04-05 ENCOUNTER — Encounter (HOSPITAL_COMMUNITY): Payer: BC Managed Care – PPO

## 2012-04-06 ENCOUNTER — Encounter (HOSPITAL_COMMUNITY): Payer: BC Managed Care – PPO

## 2012-04-06 ENCOUNTER — Ambulatory Visit: Payer: Medicare Other | Admitting: Physical Therapy

## 2012-04-10 ENCOUNTER — Ambulatory Visit: Payer: Medicare Other | Attending: Family Medicine | Admitting: Physical Therapy

## 2012-04-10 DIAGNOSIS — M25619 Stiffness of unspecified shoulder, not elsewhere classified: Secondary | ICD-10-CM | POA: Insufficient documentation

## 2012-04-10 DIAGNOSIS — IMO0001 Reserved for inherently not codable concepts without codable children: Secondary | ICD-10-CM | POA: Insufficient documentation

## 2012-04-10 DIAGNOSIS — M25519 Pain in unspecified shoulder: Secondary | ICD-10-CM | POA: Insufficient documentation

## 2012-04-11 ENCOUNTER — Encounter (HOSPITAL_COMMUNITY): Payer: BC Managed Care – PPO

## 2012-04-12 ENCOUNTER — Encounter (HOSPITAL_COMMUNITY): Payer: BC Managed Care – PPO

## 2012-04-13 ENCOUNTER — Encounter (HOSPITAL_COMMUNITY): Payer: BC Managed Care – PPO

## 2012-04-14 ENCOUNTER — Ambulatory Visit: Payer: Medicare Other | Admitting: Physical Therapy

## 2012-04-18 ENCOUNTER — Encounter (HOSPITAL_COMMUNITY): Payer: BC Managed Care – PPO

## 2012-04-18 ENCOUNTER — Ambulatory Visit: Payer: Medicare Other | Admitting: Physical Therapy

## 2012-04-19 ENCOUNTER — Encounter (HOSPITAL_COMMUNITY): Payer: BC Managed Care – PPO

## 2012-04-20 ENCOUNTER — Encounter (HOSPITAL_COMMUNITY): Payer: BC Managed Care – PPO

## 2012-04-21 ENCOUNTER — Ambulatory Visit: Payer: Medicare Other | Admitting: Physical Therapy

## 2012-04-25 ENCOUNTER — Encounter (HOSPITAL_COMMUNITY): Payer: BC Managed Care – PPO

## 2012-04-25 ENCOUNTER — Encounter: Payer: Medicare Other | Admitting: Physical Therapy

## 2012-04-26 ENCOUNTER — Encounter (HOSPITAL_COMMUNITY): Payer: BC Managed Care – PPO

## 2012-04-26 ENCOUNTER — Ambulatory Visit: Payer: Medicare Other | Admitting: Physical Therapy

## 2012-04-27 ENCOUNTER — Encounter (HOSPITAL_COMMUNITY): Payer: BC Managed Care – PPO

## 2012-04-28 ENCOUNTER — Ambulatory Visit: Payer: Medicare Other | Admitting: Physical Therapy

## 2012-05-01 ENCOUNTER — Ambulatory Visit: Payer: Medicare Other | Admitting: Physical Therapy

## 2012-05-02 ENCOUNTER — Encounter (HOSPITAL_COMMUNITY): Payer: BC Managed Care – PPO

## 2012-05-03 ENCOUNTER — Encounter (HOSPITAL_COMMUNITY): Payer: BC Managed Care – PPO

## 2012-05-04 ENCOUNTER — Encounter (HOSPITAL_COMMUNITY): Payer: BC Managed Care – PPO

## 2012-05-04 ENCOUNTER — Ambulatory Visit: Payer: Medicare Other | Admitting: Physical Therapy

## 2012-05-09 ENCOUNTER — Encounter (HOSPITAL_COMMUNITY): Payer: BC Managed Care – PPO

## 2012-05-09 ENCOUNTER — Ambulatory Visit: Payer: Medicare Other | Admitting: Physical Therapy

## 2012-05-10 ENCOUNTER — Encounter (HOSPITAL_COMMUNITY): Payer: BC Managed Care – PPO

## 2012-05-12 ENCOUNTER — Ambulatory Visit: Payer: Medicare Other | Attending: Orthopedic Surgery | Admitting: Physical Therapy

## 2012-05-12 DIAGNOSIS — M25519 Pain in unspecified shoulder: Secondary | ICD-10-CM | POA: Insufficient documentation

## 2012-05-12 DIAGNOSIS — M25619 Stiffness of unspecified shoulder, not elsewhere classified: Secondary | ICD-10-CM | POA: Insufficient documentation

## 2012-05-12 DIAGNOSIS — IMO0001 Reserved for inherently not codable concepts without codable children: Secondary | ICD-10-CM | POA: Insufficient documentation

## 2012-05-16 ENCOUNTER — Ambulatory Visit: Payer: Medicare Other | Admitting: Physical Therapy

## 2012-05-19 ENCOUNTER — Ambulatory Visit: Payer: Medicare Other | Admitting: Physical Therapy

## 2012-05-22 ENCOUNTER — Ambulatory Visit: Payer: Medicare Other | Admitting: Physical Therapy

## 2012-05-23 ENCOUNTER — Encounter: Payer: Medicare Other | Admitting: Physical Therapy

## 2012-05-24 ENCOUNTER — Encounter: Payer: Medicare Other | Admitting: Physical Therapy

## 2012-05-25 ENCOUNTER — Ambulatory Visit: Payer: Medicare Other | Admitting: Physical Therapy

## 2012-07-11 ENCOUNTER — Ambulatory Visit (INDEPENDENT_AMBULATORY_CARE_PROVIDER_SITE_OTHER): Payer: Medicare Other | Admitting: Cardiovascular Disease

## 2012-07-11 ENCOUNTER — Telehealth: Payer: Self-pay | Admitting: Cardiovascular Disease

## 2012-07-11 ENCOUNTER — Encounter: Payer: Self-pay | Admitting: Cardiovascular Disease

## 2012-07-11 VITALS — BP 150/84 | HR 62 | Ht 64.0 in | Wt 179.0 lb

## 2012-07-11 DIAGNOSIS — M109 Gout, unspecified: Secondary | ICD-10-CM

## 2012-07-11 DIAGNOSIS — I10 Essential (primary) hypertension: Secondary | ICD-10-CM

## 2012-07-11 DIAGNOSIS — E785 Hyperlipidemia, unspecified: Secondary | ICD-10-CM

## 2012-07-11 DIAGNOSIS — I471 Supraventricular tachycardia: Secondary | ICD-10-CM

## 2012-07-11 DIAGNOSIS — I498 Other specified cardiac arrhythmias: Secondary | ICD-10-CM

## 2012-07-11 LAB — HEPATIC FUNCTION PANEL
ALT: 19 U/L (ref 0–35)
Total Protein: 7.9 g/dL (ref 6.0–8.3)

## 2012-07-11 LAB — BASIC METABOLIC PANEL
Calcium: 9.5 mg/dL (ref 8.4–10.5)
Creatinine, Ser: 0.9 mg/dL (ref 0.4–1.2)

## 2012-07-11 LAB — LIPID PANEL
Cholesterol: 195 mg/dL (ref 0–200)
HDL: 40.2 mg/dL (ref >39.00)
Triglycerides: 124 mg/dL (ref 0.0–149.0)

## 2012-07-11 MED ORDER — TORSEMIDE 20 MG PO TABS: 20.0000 mg | ORAL_TABLET | Freq: Every day | ORAL | Status: DC

## 2012-07-11 MED ORDER — METOPROLOL SUCCINATE ER 100 MG PO TB24: 100.0000 mg | ORAL_TABLET | Freq: Every day | ORAL | Status: DC

## 2012-07-11 MED ORDER — PROPRANOLOL HCL 10 MG PO TABS: 10.0000 mg | ORAL_TABLET | Freq: Four times a day (QID) | ORAL | Status: DC | PRN

## 2012-07-11 MED ORDER — SPIRONOLACTONE 25 MG PO TABS: 50.0000 mg | ORAL_TABLET | Freq: Every day | ORAL | Status: DC

## 2012-07-11 NOTE — Assessment & Plan Note (Addendum)
Yolanda Bell had an episode of supraventricular tachycardia in June. [name] on metoprolol. We called her in some propranolol  although she's not had to use it yet.  Will continue with her current medications.

## 2012-07-11 NOTE — Patient Instructions (Addendum)
Your physician wants you to follow-up in: 6 months  You will receive a reminder letter in the mail two months in advance. If you don't receive a letter, please call our office to schedule the follow-up appointment.  Your physician recommends that you return for a FASTING lipid profile: today we will draw labs

## 2012-07-11 NOTE — Telephone Encounter (Signed)
Paperwork filled out and taken to MR.
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
## 2012-07-14 ENCOUNTER — Encounter: Payer: Self-pay | Admitting: Cardiovascular Disease

## 2012-07-20 ENCOUNTER — Telehealth: Payer: Self-pay | Admitting: Cardiology

## 2012-07-20 ENCOUNTER — Encounter: Payer: Self-pay | Admitting: Cardiovascular Disease

## 2012-07-20 ENCOUNTER — Telehealth: Payer: Self-pay | Admitting: Cardiovascular Disease

## 2012-07-20 NOTE — Telephone Encounter (Signed)
Pt rtn call re blood work °

## 2012-07-20 NOTE — Telephone Encounter (Signed)
Error-wrong dr

## 2012-07-20 NOTE — Telephone Encounter (Signed)
Lab results given

## 2012-08-15 ENCOUNTER — Encounter (HOSPITAL_COMMUNITY)
Admission: RE | Admit: 2012-08-15 | Discharge: 2012-08-15 | Disposition: A | Discharge status: Home / Self Care | Payer: Self-pay | Source: Ambulatory Visit | Attending: Cardiovascular Disease | Admitting: Cardiovascular Disease

## 2012-08-15 DIAGNOSIS — Z5189 Encounter for other specified aftercare: Secondary | ICD-10-CM | POA: Insufficient documentation

## 2012-08-15 DIAGNOSIS — I311 Chronic constrictive pericarditis: Secondary | ICD-10-CM | POA: Insufficient documentation

## 2012-08-16 ENCOUNTER — Encounter (HOSPITAL_COMMUNITY)
Admission: RE | Admit: 2012-08-16 | Discharge: 2012-08-16 | Disposition: A | Discharge status: Home / Self Care | Payer: Self-pay | Source: Ambulatory Visit | Attending: Cardiovascular Disease | Admitting: Cardiovascular Disease

## 2012-08-17 ENCOUNTER — Encounter (HOSPITAL_COMMUNITY)
Admission: RE | Admit: 2012-08-17 | Discharge: 2012-08-17 | Disposition: A | Discharge status: Home / Self Care | Payer: Self-pay | Source: Ambulatory Visit | Attending: Cardiovascular Disease | Admitting: Cardiovascular Disease

## 2012-08-22 ENCOUNTER — Encounter (HOSPITAL_COMMUNITY)
Admission: RE | Admit: 2012-08-22 | Discharge: 2012-08-22 | Disposition: A | Discharge status: Home / Self Care | Payer: Self-pay | Source: Ambulatory Visit | Attending: Cardiovascular Disease | Admitting: Cardiovascular Disease

## 2012-08-23 ENCOUNTER — Encounter (HOSPITAL_COMMUNITY): Payer: Self-pay

## 2012-08-24 ENCOUNTER — Encounter (HOSPITAL_COMMUNITY)
Admission: RE | Admit: 2012-08-24 | Discharge: 2012-08-24 | Disposition: A | Discharge status: Home / Self Care | Payer: Self-pay | Source: Ambulatory Visit | Attending: Cardiovascular Disease | Admitting: Cardiovascular Disease

## 2012-08-29 ENCOUNTER — Encounter (HOSPITAL_COMMUNITY)
Admission: RE | Admit: 2012-08-29 | Discharge: 2012-08-29 | Disposition: A | Discharge status: Home / Self Care | Payer: Self-pay | Source: Ambulatory Visit | Attending: Cardiovascular Disease | Admitting: Cardiovascular Disease

## 2012-08-30 ENCOUNTER — Encounter (HOSPITAL_COMMUNITY)
Admission: RE | Admit: 2012-08-30 | Discharge: 2012-08-30 | Disposition: A | Discharge status: Home / Self Care | Payer: Self-pay | Source: Ambulatory Visit | Attending: Cardiovascular Disease | Admitting: Cardiovascular Disease

## 2012-08-31 ENCOUNTER — Encounter (HOSPITAL_COMMUNITY): Payer: Self-pay

## 2012-09-05 ENCOUNTER — Encounter (HOSPITAL_COMMUNITY): Payer: Self-pay

## 2012-09-06 ENCOUNTER — Encounter (HOSPITAL_COMMUNITY)
Admission: RE | Admit: 2012-09-06 | Discharge: 2012-09-06 | Disposition: A | Discharge status: Home / Self Care | Payer: Self-pay | Source: Ambulatory Visit | Attending: Cardiovascular Disease | Admitting: Cardiovascular Disease

## 2012-09-12 ENCOUNTER — Encounter (HOSPITAL_COMMUNITY)
Admission: RE | Admit: 2012-09-12 | Discharge: 2012-09-12 | Disposition: A | Discharge status: Home / Self Care | Payer: Self-pay | Source: Ambulatory Visit | Attending: Cardiovascular Disease | Admitting: Cardiovascular Disease

## 2012-09-12 DIAGNOSIS — I311 Chronic constrictive pericarditis: Secondary | ICD-10-CM | POA: Insufficient documentation

## 2012-09-12 DIAGNOSIS — Z5189 Encounter for other specified aftercare: Secondary | ICD-10-CM | POA: Insufficient documentation

## 2012-09-13 ENCOUNTER — Encounter (HOSPITAL_COMMUNITY)
Admission: RE | Admit: 2012-09-13 | Discharge: 2012-09-13 | Disposition: A | Discharge status: Home / Self Care | Payer: Self-pay | Source: Ambulatory Visit | Attending: Cardiovascular Disease | Admitting: Cardiovascular Disease

## 2012-09-14 ENCOUNTER — Encounter (HOSPITAL_COMMUNITY): Payer: Self-pay

## 2012-09-19 ENCOUNTER — Encounter (HOSPITAL_COMMUNITY)
Admission: RE | Admit: 2012-09-19 | Discharge: 2012-09-19 | Disposition: A | Discharge status: Home / Self Care | Payer: Self-pay | Source: Ambulatory Visit | Attending: Cardiovascular Disease | Admitting: Cardiovascular Disease

## 2012-09-20 ENCOUNTER — Encounter (HOSPITAL_COMMUNITY): Payer: Self-pay

## 2012-09-21 ENCOUNTER — Encounter (HOSPITAL_COMMUNITY)
Admission: RE | Admit: 2012-09-21 | Discharge: 2012-09-21 | Disposition: A | Discharge status: Home / Self Care | Payer: Self-pay | Source: Ambulatory Visit | Attending: Cardiovascular Disease | Admitting: Cardiovascular Disease

## 2012-09-26 ENCOUNTER — Encounter (HOSPITAL_COMMUNITY)
Admission: RE | Admit: 2012-09-26 | Discharge: 2012-09-26 | Disposition: A | Discharge status: Home / Self Care | Payer: Self-pay | Source: Ambulatory Visit | Attending: Cardiovascular Disease | Admitting: Cardiovascular Disease

## 2012-09-27 ENCOUNTER — Encounter (HOSPITAL_COMMUNITY): Payer: Self-pay

## 2012-09-28 ENCOUNTER — Encounter (HOSPITAL_COMMUNITY)
Admission: RE | Admit: 2012-09-28 | Discharge: 2012-09-28 | Disposition: A | Discharge status: Home / Self Care | Payer: Self-pay | Source: Ambulatory Visit | Attending: Cardiovascular Disease | Admitting: Cardiovascular Disease

## 2012-10-03 ENCOUNTER — Encounter (HOSPITAL_COMMUNITY): Payer: Self-pay

## 2012-10-05 ENCOUNTER — Encounter (HOSPITAL_COMMUNITY): Payer: Self-pay

## 2012-10-10 ENCOUNTER — Encounter (HOSPITAL_COMMUNITY): Payer: Self-pay

## 2012-10-10 ENCOUNTER — Other Ambulatory Visit: Payer: Self-pay | Admitting: *Deleted

## 2012-10-10 NOTE — Telephone Encounter (Signed)
Opened in Error.

## 2012-10-12 ENCOUNTER — Encounter (HOSPITAL_COMMUNITY): Payer: BC Managed Care – PPO

## 2012-10-12 DIAGNOSIS — I311 Chronic constrictive pericarditis: Secondary | ICD-10-CM | POA: Insufficient documentation

## 2012-10-12 DIAGNOSIS — Z5189 Encounter for other specified aftercare: Secondary | ICD-10-CM | POA: Insufficient documentation

## 2012-10-17 ENCOUNTER — Ambulatory Visit (HOSPITAL_COMMUNITY)
Admission: RE | Admit: 2012-10-17 | Discharge: 2012-10-17 | Disposition: A | Discharge status: Home / Self Care | Payer: Medicare Other | Source: Ambulatory Visit | Attending: Cardiology | Admitting: Cardiology

## 2012-10-17 ENCOUNTER — Telehealth: Payer: Self-pay | Admitting: *Deleted

## 2012-10-17 ENCOUNTER — Encounter (HOSPITAL_COMMUNITY)
Admission: RE | Admit: 2012-10-17 | Discharge: 2012-10-17 | Disposition: A | Discharge status: Home / Self Care | Payer: BC Managed Care – PPO | Source: Ambulatory Visit | Attending: Cardiovascular Disease | Admitting: Cardiovascular Disease

## 2012-10-17 DIAGNOSIS — I4892 Unspecified atrial flutter: Secondary | ICD-10-CM | POA: Insufficient documentation

## 2012-10-17 NOTE — Progress Notes (Addendum)
Dr Elease Hashimoto reviewed 12 lead ECG.  Dr Elease Hashimoto said Yolanda Bell is okay to go home.  Yolanda Bell is going home to take her PRN propanolol.  Dr Harvie Bridge nurse Michael Litter will contact the patient about restarting warfarin.  Repeat blood Pressure 112/60 heart rate 138.  One of our volunteers is driving Yolanda Bell home. Oxygen dc'd

## 2012-10-17 NOTE — Telephone Encounter (Signed)
Call coming in from [place] rn from cardiac rehab stating pt started out exercising and HR was 139 bpm, staying now at 140 bpm and shows aflutter, pt denies sob- feels "ok", cant tell she is out of rhythm. Pt resting currently, ekg ordered to be done there in rehab, she has taken her morning metoprolol 100 mg this morning. Byrd Hesselbach will send ekg strips.  Dr Elease Hashimoto reviewed ekg strips and confirmed aflutter, request pt go home and start propranolol to see if she will convert, spoke with pt on phone and informed her that Dr wants her to start coumadin, pt declines at this time despite informing her of possible complications, she is waiting to have cataract surgery 11/02/12 the first eye then will schedule the other. Told her that most eye surgery can be done with holding the med for a couple days, pt declines coumadin again stating she has only been out of rhythm twice and wants to wait to go on coumadin, Pt told to go home and take it easy, start propranolol and call if heart rate remains fast or to call with any questions, told her she will need to be seen sooner than original set app but that she can call and I will get her into his schedule, pt verbalized understanding and agreed to plan.

## 2012-10-17 NOTE — Progress Notes (Addendum)
Yolanda Bell noticed her heart was elevated today.  Entry heart rate 139.  Yolanda Bell exercised for 2 stations before alerting staff.  Patient placed on Zoll rhythm Atrial flutter 140's .  Dr Harvie Bridge office called and notified.  12 lead ECG obtained. Blood pressure 92/60.  Sa02 98% on room air.  Will send today's ECG tracing's via Care[employer] information officer for Dr Elease Hashimoto to review. Patient asymptomatic. Patient placed on oxygen at 2l/min.

## 2012-10-17 NOTE — Telephone Encounter (Signed)
Have her take her propranolol.  She will need an office visit if she stays in A flutter.  We should consider Xarelto or Pradaxa

## 2012-10-18 ENCOUNTER — Encounter (HOSPITAL_COMMUNITY): Payer: BC Managed Care – PPO

## 2012-10-18 NOTE — Telephone Encounter (Signed)
Spoke to patient she stated she is taking propranolol 10 mg four times a day and her heart rate today has been in the 90's to low 100's.States she thinks heart is still out of rhythm.Patient was told Dr.Nahser's nurse out of the office, will send message to her for appointment with Dr.Nahser.

## 2012-10-18 NOTE — Telephone Encounter (Signed)
New Problem:    Patient called in because her HR an BP is still out of normal range and was calling back to consult with Jodette about possible blood thinners other than coumadin.  Patient reports that she is doing better than it was but it is still out of rhythm.  Please call back.

## 2012-10-19 ENCOUNTER — Ambulatory Visit (INDEPENDENT_AMBULATORY_CARE_PROVIDER_SITE_OTHER): Payer: Medicare Other | Admitting: Cardiovascular Disease

## 2012-10-19 ENCOUNTER — Encounter (HOSPITAL_COMMUNITY): Payer: BC Managed Care – PPO

## 2012-10-19 ENCOUNTER — Encounter: Payer: Self-pay | Admitting: Cardiovascular Disease

## 2012-10-19 VITALS — BP 132/90 | HR 126 | Ht 64.0 in | Wt 184.0 lb

## 2012-10-19 DIAGNOSIS — E785 Hyperlipidemia, unspecified: Secondary | ICD-10-CM

## 2012-10-19 DIAGNOSIS — I4891 Unspecified atrial fibrillation: Secondary | ICD-10-CM

## 2012-10-19 MED ORDER — METOPROLOL SUCCINATE ER 100 MG PO TB24: ORAL_TABLET | ORAL | Status: DC

## 2012-10-19 MED ORDER — RIVAROXABAN 20 MG PO TABS: 20.0000 mg | ORAL_TABLET | Freq: Every day | ORAL | Status: DC

## 2012-10-19 NOTE — Telephone Encounter (Signed)
Pt to come in now for app, pt agreed to plan.

## 2012-10-19 NOTE — Telephone Encounter (Signed)
Spoke with her, feeling well but still out of rhythm, bp 104/62 p 90 bpm,  discussed going on xarelto, pt voiced concerns with both her upcoming eye/ cataract surgery and afraid med will do liver damage. Will discuss further with dr Elease Hashimoto.

## 2012-10-19 NOTE — Assessment & Plan Note (Addendum)
Chianne presents today with recurrent atrial fibrillation. This was found in cardiac rehabilitation 2 days ago.  [name] almost completely asymptomatic but she can feel heartbeat irregularities when she is lying down. She gets fatigued a little earlier than normal but other than that she does not have any episodes of chest pain or shortness of breath.  It's been approximately 3 years since her last echocardiogram. We will repeat her echocardiogram for further evaluation of her cardiac function and valvular function. We'll start her on Xarelto 20 mg a day. We'll also increase her metoprolol to 150 mg a day to help with rate control. I'll see her again in a month or 2 for followup visit and EKG. We will anticipate doing  a cardioversion sometime after that visit.  She is scheduled to have cataract surgery sometime later this month. There are no cardiac contraindications to her having cataract surgery. They may want her to hold her Xarelto

## 2012-10-19 NOTE — Patient Instructions (Addendum)
Your physician has requested that you have an echocardiogram. Echocardiography is a painless test that uses sound waves to create images of your heart. It provides your doctor with information about the size and shape of your heart and how well your heart's chambers and valves are working. This procedure takes approximately one hour. There are no restrictions for this procedure.  Your physician recommends that you schedule a follow-up appointment in: HAS APP ALREADY BUT SEE IF YOU CAN GET HER IN IN 1 MONTH PLEASE WITH EKG .   Your physician has recommended you make the following change in your medication:   INCREASE METOPROLOL XL TO 150 MG DAILY- TAKE ONE AND A HALF TABLETS DAILY  START XARELTO 20 MG DAILY FOR AFIB PROTECTION, THIS IS A BLOOD THINNER.
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
## 2012-10-20 ENCOUNTER — Telehealth: Payer: Self-pay | Admitting: Cardiovascular Disease

## 2012-10-20 DIAGNOSIS — E785 Hyperlipidemia, unspecified: Secondary | ICD-10-CM

## 2012-10-20 NOTE — Telephone Encounter (Signed)
New problem   The change in medication did not seem to be  Improving heart rate nor blood pressure. Please advise.

## 2012-10-20 NOTE — Telephone Encounter (Signed)
Pt bp 134/80 p 98 just had toprol xl increased but no change, wants to know if she can take additional propranolol 10 mg, told pt she could as long as her bp is high enough, pt checks bp regularly. Pt knows to go to er if feels bad/ sob, pt states she is ok right now, told her to call Monday to update, pt agreed to plan.

## 2012-10-24 ENCOUNTER — Ambulatory Visit (HOSPITAL_COMMUNITY): Payer: Medicare Other | Attending: Cardiology

## 2012-10-24 ENCOUNTER — Encounter (HOSPITAL_COMMUNITY): Payer: BC Managed Care – PPO

## 2012-10-24 DIAGNOSIS — I059 Rheumatic mitral valve disease, unspecified: Secondary | ICD-10-CM | POA: Insufficient documentation

## 2012-10-24 DIAGNOSIS — I10 Essential (primary) hypertension: Secondary | ICD-10-CM | POA: Insufficient documentation

## 2012-10-24 DIAGNOSIS — I4891 Unspecified atrial fibrillation: Secondary | ICD-10-CM | POA: Insufficient documentation

## 2012-10-24 DIAGNOSIS — I369 Nonrheumatic tricuspid valve disorder, unspecified: Secondary | ICD-10-CM | POA: Insufficient documentation

## 2012-10-24 NOTE — Progress Notes (Signed)
Echocardiogram performed.  

## 2012-10-24 NOTE — Telephone Encounter (Signed)
msg left to call if any further problems. Number provided.

## 2012-10-25 ENCOUNTER — Encounter (HOSPITAL_COMMUNITY): Payer: BC Managed Care – PPO

## 2012-10-25 NOTE — Telephone Encounter (Signed)
Left message to call back  

## 2012-10-25 NOTE — Telephone Encounter (Signed)
She is still having palpitations.  Have her increase her metoprolol to 200 mg a day ( 2 tabs)

## 2012-10-25 NOTE — Telephone Encounter (Signed)
Pt states her heart is still beating very rapidly, (91-125) despite increasing her propranolol to 4x per day and the metoprolol to 1 1/2 tabs qam.

## 2012-10-25 NOTE — Telephone Encounter (Signed)
Echo results were given to pt. 

## 2012-10-25 NOTE — Telephone Encounter (Signed)
F/U   Patient called stating she was in the office on yesterday having an echo when the nurse had called.    Leaving town this afternoon.  Test results

## 2012-10-26 ENCOUNTER — Encounter (HOSPITAL_COMMUNITY): Payer: BC Managed Care – PPO

## 2012-10-26 NOTE — Telephone Encounter (Signed)
I suspect her HR will slow after several days of the higher dose of metoprolol.  Have her call us with HR next week. We can schedule a TEE cardioversion next week if she is still not tolerating the Atrial fib.

## 2012-10-27 MED ORDER — METOPROLOL SUCCINATE ER 100 MG PO TB24: 200.0000 mg | ORAL_TABLET | Freq: Every day | ORAL | Status: DC

## 2012-10-27 NOTE — Addendum Note (Signed)
Addended by: Antony Odea on: 10/27/2012 08:53 AM   Modules accepted: Orders

## 2012-10-27 NOTE — Telephone Encounter (Signed)
Medication increased, pt to call back tue 10/31/12 with HR results, reviewed poss need to CV/ TEE, pt decline at this time due to cataract surg. scheduled for 11/02/12, she states she is tolerating afib currently. Pt agreed to plan.

## 2012-10-31 ENCOUNTER — Encounter (HOSPITAL_COMMUNITY): Payer: BC Managed Care – PPO

## 2012-10-31 ENCOUNTER — Telehealth: Payer: Self-pay | Admitting: Cardiovascular Disease

## 2012-10-31 ENCOUNTER — Other Ambulatory Visit: Payer: Self-pay | Admitting: *Deleted

## 2012-10-31 DIAGNOSIS — E785 Hyperlipidemia, unspecified: Secondary | ICD-10-CM

## 2012-10-31 MED ORDER — PROPRANOLOL HCL 10 MG PO TABS: 10.0000 mg | ORAL_TABLET | Freq: Four times a day (QID) | ORAL | Status: DC | PRN

## 2012-10-31 NOTE — Telephone Encounter (Signed)
Fax Received. Refill Completed. Jodi Criscuolo Chowoe (R.M.A)   

## 2012-10-31 NOTE — Telephone Encounter (Signed)
Pt calling to let you know how she has been doing and would like a call back to discuss this

## 2012-10-31 NOTE — Telephone Encounter (Signed)
Been sleeping better but still has to take it easy because additional exertion makes her HR go up into low 100's. Current bp 120/80 hr goes from 70-100. PT HAS CATARACT SURG 11/03/12, she will call with further updates if needed.

## 2012-11-01 ENCOUNTER — Encounter (HOSPITAL_COMMUNITY): Admission: RE | Admit: 2012-11-01 | Payer: BC Managed Care – PPO | Source: Ambulatory Visit

## 2012-11-01 ENCOUNTER — Other Ambulatory Visit (HOSPITAL_COMMUNITY): Payer: Self-pay | Admitting: *Deleted

## 2012-11-01 ENCOUNTER — Telehealth (HOSPITAL_COMMUNITY): Payer: Self-pay | Admitting: *Deleted

## 2012-11-02 ENCOUNTER — Encounter (HOSPITAL_COMMUNITY): Payer: BC Managed Care – PPO

## 2012-11-07 ENCOUNTER — Encounter (HOSPITAL_COMMUNITY): Payer: BC Managed Care – PPO

## 2012-11-08 ENCOUNTER — Encounter (HOSPITAL_COMMUNITY): Payer: BC Managed Care – PPO

## 2012-11-09 ENCOUNTER — Encounter (HOSPITAL_COMMUNITY): Payer: BC Managed Care – PPO

## 2012-11-13 ENCOUNTER — Telehealth: Payer: Self-pay | Admitting: Cardiovascular Disease

## 2012-11-13 NOTE — Telephone Encounter (Signed)
New Problem     Pt states she hasn't been able to sleep in the last 3 days due to her afib. Would like to speak to nurse regarding scheduling her cardioversion.

## 2012-11-13 NOTE — Telephone Encounter (Signed)
Called patient back. Her heart rate has been between 80 and 120 and has some SOB. Can't sleep at night because her fast heart rate is always waking her up. Does not want to wait any longer for cardioversion. Advised her to come in to see Dr.Nahser on 2/4 at 245 pm.

## 2012-11-14 ENCOUNTER — Ambulatory Visit (INDEPENDENT_AMBULATORY_CARE_PROVIDER_SITE_OTHER): Payer: Medicare Other | Admitting: Cardiovascular Disease

## 2012-11-14 ENCOUNTER — Encounter (HOSPITAL_COMMUNITY): Payer: BC Managed Care – PPO | Attending: Cardiovascular Disease

## 2012-11-14 ENCOUNTER — Other Ambulatory Visit: Payer: Self-pay | Admitting: Cardiovascular Disease

## 2012-11-14 ENCOUNTER — Encounter: Payer: Self-pay | Admitting: Cardiovascular Disease

## 2012-11-14 ENCOUNTER — Encounter: Payer: Self-pay | Admitting: *Deleted

## 2012-11-14 VITALS — BP 102/63 | HR 114 | Ht 64.0 in | Wt 182.8 lb

## 2012-11-14 DIAGNOSIS — I4891 Unspecified atrial fibrillation: Secondary | ICD-10-CM

## 2012-11-14 DIAGNOSIS — Z5189 Encounter for other specified aftercare: Secondary | ICD-10-CM | POA: Insufficient documentation

## 2012-11-14 DIAGNOSIS — I311 Chronic constrictive pericarditis: Secondary | ICD-10-CM | POA: Insufficient documentation

## 2012-11-14 NOTE — Patient Instructions (Addendum)
Your physician has recommended that you have a Cardioversion (DCCV). Electrical Cardioversion uses a jolt of electricity to your heart either through paddles or wired patches attached to your chest. This is a controlled, usually prescheduled, procedure. Defibrillation is done under light anesthesia in the hospital, and you usually go home the day of the procedure. This is done to get your heart back into a normal rhythm. You are not awake for the procedure. Please see the instruction sheet given to you today.  Your physician recommends that you schedule a follow-up appointment in: 1 Months with Dr. Elease Hashimoto or Lawson Fiscal, NP  Your physician recommends that you return for lab work in: Today (BMET, CBC)

## 2012-11-14 NOTE — Progress Notes (Signed)
For cardioversion

## 2012-11-14 NOTE — Assessment & Plan Note (Signed)
Yolanda Bell presents with persistent atrial fibrillation with a rapid ventricular response. [name] been on the Xarelto for almost 4 weeks now. We will schedule her for a cardiac eversion next week. We'll continue with her same medication for the time being.

## 2012-11-14 NOTE — Progress Notes (Signed)
Yolanda Bell Date of Birth  [birth year] [facility]     [place] Office  [address]    [address]   [address] [place], [place]  [address]    [place], [place]  [address] [phone]  Fax  [phone]  [phone]  Fax [phone]  Problems: 1.  Constrictive Pericarditis 2.  A-Flutter / Fibrillation 3.  Gout 4.  Spastic dysphonia 5. SVT 6. HTN  History of Present Illness:  Yolanda Bell has done well from a cardiac standpoint.  Yolanda Bell has had a flare up of gout which has affected both feet.   Yolanda Bell has been taking indocin for the flare ups.    Yolanda Bell had a bad accident in February. Yolanda Bell was getting off an airplane and tripped on the off ramp in [place].  Yolanda Bell broke both shoulders, left thumb and had soft tissue injury to her left foot.  Yolanda Bell was treated conservatively (bilateral arm slings).   Her left thumb fracture was not found until Yolanda Bell returned to [place].   Yolanda Bell had some tachypalpitations for 22 hours during her recovery.  Her HR was 130-140 for 22 hours and resolved spontaneously.   I suspect Yolanda Bell had a brief episode of atrial flutter.  Yolanda Bell was quite emotional when Yolanda Bell was telling me the issues of her fall. It is  apparently still is quite upsetting to her.  October 19, 2012: Yolanda Bell presents today for further evaluation of atrial fib / flutter that was found at cardiac rehab.  Yolanda Bell really cannot tell that her HR is fast and irregular.   Yolanda Bell can feel her HR when Yolanda Bell is lying down.  Yolanda Bell is asymptomatic when Yolanda Bell is up and busy doing chores.  Yolanda Bell denies any chest pain or dyspnea.   Feb. 4, 2014: Yolanda Bell seems to have rapid atrial fibrillation. Yolanda Bell's on relatively high dose of metoprolol XL and also is on when necessary propranolol. He is tolerating the atrial ablation but certainly does not feel as well as Yolanda Bell would like to. Yolanda Bell's here to schedule her cardioversion next week. Yolanda Bell denies any angina. Yolanda Bell does have some shortness of breath.  Current Outpatient Prescriptions on File  Prior to Visit  Medication Sig Dispense Refill  . aspirin 325 MG tablet Take 325 mg by mouth daily.        . Cholecalciferol (VITAMIN D PO) Take 1,000 mg by mouth daily.      . Glucosamine-Chondroitin (GLUCOSAMINE CHONDR COMPLEX PO) Take 2 tablets by mouth daily.       . indomethacin (INDOCIN) 25 MG capsule Take 25 mg by mouth 2 (two) times daily as needed.      . metoprolol succinate (TOPROL-XL) 100 MG 24 hr tablet Take 2 tablets (200 mg total) by mouth daily.  180 tablet  3  . propranolol (INDERAL) 10 MG tablet Take 1 tablet (10 mg total) by mouth 4 (four) times daily as needed (palpitations).  120 tablet  3  . Rivaroxaban (XARELTO) 20 MG TABS Take 1 tablet (20 mg total) by mouth daily.  30 tablet  11  . spironolactone (ALDACTONE) 25 MG tablet Take 2 tablets (50 mg total) by mouth daily.  180 tablet  3  . torsemide (DEMADEX) 20 MG tablet Take 1 tablet (20 mg total) by mouth daily.  90 tablet  3    Allergies  Allergen Reactions  . Amiodarone     Dizziness, halucinations  . Azithromycin     Past Medical History  Diagnosis Date  .  Constrictive pericarditis     2 status post pericardial stripping at the [facility]  . Atrial fibrillation     Status post TEE cardioversion  . Spastic dysphonia   . Supraventricular tachycardia     Status post RF ablation by Dr. Anabel Halon    No past surgical history on file.  History  Smoking status  . Never Smoker   Smokeless tobacco  . Not on file    History  Alcohol Use: Not on file    No family history on file.  Reviw of Systems:  Reviewed in the HPI.  All other systems are negative.  Physical Exam: Blood pressure 102/63, pulse 114, height 5\' 4"  (1.626 m), weight 182 lb 12.8 oz (82.918 kg), SpO2 96.00%. General: Well developed, well nourished, in no acute distress.  Yolanda Bell has spastic dysphonia. Head: Normocephalic, atraumatic, sclera non-icteric, mucus membranes are moist  Neck: Supple. Negative for carotid bruits. JVD not  elevated.  Lungs: Clear bilaterally to auscultation without wheezes, rales, or rhonchi. Breathing is unlabored.  Heart: irregularly irregular with S1 S2. No murmurs, rubs, or gallops appreciated.  Abdomen: Soft, non-tender, non-distended with normoactive bowel sounds. No hepatomegaly. No rebound/guarding. No obvious abdominal masses.  Msk:  Strength and tone appear normal for age.  Extremities: No clubbing or cyanosis. No edema.  Distal pedal pulses are 2+ and equal bilaterally.  Neuro: Alert and oriented X 3. Moves all extremities spontaneously.  Psych:  Responds to questions appropriately with a normal affect.  ECG: October 19, 2012: Atrial fibrillation with a rapid ventricular response. Yolanda Bell has nonspecific ST and T wave changes.  Assessment / Plan:

## 2012-11-14 NOTE — Telephone Encounter (Signed)
App today

## 2012-11-15 ENCOUNTER — Encounter (HOSPITAL_COMMUNITY): Admission: RE | Admit: 2012-11-15 | Payer: BC Managed Care – PPO | Source: Ambulatory Visit

## 2012-11-15 LAB — BASIC METABOLIC PANEL WITH GFR
BUN: 21 mg/dL (ref 6–23)
CO2: 30 meq/L (ref 19–32)
Calcium: 9.4 mg/dL (ref 8.4–10.5)
Chloride: 103 meq/L (ref 96–112)
Creatinine, Ser: 0.9 mg/dL (ref 0.4–1.2)
GFR: 62.59 mL/min
Glucose, Bld: 90 mg/dL (ref 70–99)
Potassium: 4.5 meq/L (ref 3.5–5.1)
Sodium: 139 meq/L (ref 135–145)

## 2012-11-15 LAB — CBC WITH DIFFERENTIAL/PLATELET
Eosinophils Absolute: 0 10*3/uL (ref 0.0–0.7)
HCT: 40.8 % (ref 36.0–46.0)
Lymphs Abs: 1.8 10*3/uL (ref 0.7–4.0)
MCHC: 33.5 g/dL (ref 30.0–36.0)
MCV: 87.5 fl (ref 78.0–100.0)
Monocytes Absolute: 0.3 10*3/uL (ref 0.1–1.0)
Neutrophils Relative %: 70.3 % (ref 43.0–77.0)
Platelets: 202 10*3/uL (ref 150.0–400.0)
RDW: 13.6 % (ref 11.5–14.6)
WBC: 7.1 10*3/uL (ref 4.5–10.5)

## 2012-11-16 ENCOUNTER — Encounter (HOSPITAL_COMMUNITY): Payer: BC Managed Care – PPO

## 2012-11-20 ENCOUNTER — Encounter (HOSPITAL_COMMUNITY): Payer: Self-pay | Admitting: Pharmacy Technician

## 2012-11-21 ENCOUNTER — Encounter (HOSPITAL_COMMUNITY): Payer: Self-pay | Admitting: *Deleted

## 2012-11-21 ENCOUNTER — Encounter (HOSPITAL_COMMUNITY)
Admission: RE | Disposition: A | Discharge status: Home / Self Care | Payer: Self-pay | Source: Ambulatory Visit | Attending: Cardiovascular Disease

## 2012-11-21 ENCOUNTER — Ambulatory Visit (HOSPITAL_COMMUNITY): Payer: Medicare Other | Admitting: *Deleted

## 2012-11-21 ENCOUNTER — Encounter (HOSPITAL_COMMUNITY): Payer: BC Managed Care – PPO

## 2012-11-21 ENCOUNTER — Ambulatory Visit (HOSPITAL_COMMUNITY)
Admission: RE | Admit: 2012-11-21 | Discharge: 2012-11-21 | Disposition: A | Discharge status: Home / Self Care | Payer: Medicare Other | Source: Ambulatory Visit | Attending: Cardiovascular Disease | Admitting: Cardiovascular Disease

## 2012-11-21 DIAGNOSIS — I10 Essential (primary) hypertension: Secondary | ICD-10-CM | POA: Insufficient documentation

## 2012-11-21 DIAGNOSIS — I4891 Unspecified atrial fibrillation: Secondary | ICD-10-CM | POA: Insufficient documentation

## 2012-11-21 HISTORY — PX: CARDIOVERSION: SHX1299

## 2012-11-21 SURGERY — CARDIOVERSION
Anesthesia: General | Wound class: Clean

## 2012-11-21 MED ORDER — PROPOFOL 10 MG/ML IV BOLUS
INTRAVENOUS | Status: DC | PRN
  Administered 2012-11-21: 40 mg via INTRAVENOUS

## 2012-11-21 MED ORDER — SODIUM CHLORIDE 0.9 % IV SOLN
INTRAVENOUS | Status: DC | PRN
  Administered 2012-11-21: 12:00:00 via INTRAVENOUS

## 2012-11-21 MED ORDER — SODIUM CHLORIDE 0.9 % IV SOLN
INTRAVENOUS | Status: DC
  Administered 2012-11-21: 500 mL via INTRAVENOUS

## 2012-11-21 MED ORDER — LIDOCAINE HCL (CARDIAC) 20 MG/ML IV SOLN
INTRAVENOUS | Status: DC | PRN
  Administered 2012-11-21: 40 mg via INTRAVENOUS

## 2012-11-21 NOTE — Transfer of Care (Signed)
Immediate Anesthesia Transfer of Care Note  Patient: Yolanda Bell  Procedure(s) Performed: Procedure(s): CARDIOVERSION  Patient Location: PACU  Anesthesia Type:General  Level of Consciousness: awake, oriented, patient cooperative and responds to stimulation  Airway & Oxygen Therapy: Patient Spontanous Breathing and Patient connected to nasal cannula oxygen  Post-op Assessment: Report given to PACU RN and Post -op Vital signs reviewed and stable  Post vital signs: Reviewed and stable  Complications: No apparent anesthesia complications

## 2012-11-21 NOTE — Anesthesia Postprocedure Evaluation (Signed)
  Anesthesia Post-op Note  Patient: Yolanda Bell  Procedure(s) Performed: Procedure(s): CARDIOVERSION  Patient Location: PACU and Endoscopy Unit  Anesthesia Type:General  Level of Consciousness: awake, oriented and patient cooperative  Airway and Oxygen Therapy: Patient Spontanous Breathing and Patient connected to nasal cannula oxygen  Post-op Pain: none  Post-op Assessment: Post-op Vital signs reviewed, Patient's Cardiovascular Status Stable and Respiratory Function Stable  Post-op Vital Signs: Reviewed and stable  Complications: No apparent anesthesia complications

## 2012-11-21 NOTE — Preoperative (Signed)
Beta Blockers   Reason not to administer Beta Blockers:Not Applicable, took this AM 

## 2012-11-21 NOTE — H&P (Signed)
Yolanda Bell  Date of Birth [birth year]  [facility] [place] Office  [address]  [place], [place] [address] [place], [place] [address]  [phone] Fax [phone] Fax [phone]  Problems:  1. Constrictive Pericarditis  2. A-Flutter / Fibrillation  3. Gout  4. Spastic dysphonia  5. SVT  6. HTN  History of Present Illness:  She has done well from a cardiac standpoint. She has had a flare up of gout which has affected both feet. She has been taking indocin for the flare ups.  She had a bad accident in February. She was getting off an airplane and tripped on the off ramp in [place]. She broke both shoulders, left thumb and had soft tissue injury to her left foot. She was treated conservatively (bilateral arm slings). Her left thumb fracture was not found until she returned to [place]. She had some tachypalpitations for 22 hours during her recovery. Her HR was 130-140 for 22 hours and resolved spontaneously. I suspect she had a brief episode of atrial flutter.  She was quite emotional when she was telling me the issues of her fall. It is apparently still is quite upsetting to her.  October 19, 2012:  She presents today for further evaluation of atrial fib / flutter that was found at cardiac rehab. She really cannot tell that her HR is fast and irregular. She can feel her HR when she is lying down. She is asymptomatic when she is up and busy doing chores. She denies any chest pain or dyspnea.  Feb. 4, 2014:  [name] seems to have rapid atrial fibrillation. [name] on relatively high dose of metoprolol XL and also is on when necessary propranolol. He is tolerating the atrial ablation but certainly does not feel as well as she would like to. [name] here to schedule her cardioversion next week. She denies any angina. She does have some shortness of breath.  Current Outpatient Prescriptions on File Prior to Visit   Medication  Sig  Dispense   Refill   .  aspirin 325 MG tablet  Take 325 mg by mouth daily.     .  Cholecalciferol (VITAMIN D PO)  Take 1,000 mg by mouth daily.     .  Glucosamine-Chondroitin (GLUCOSAMINE CHONDR COMPLEX PO)  Take 2 tablets by mouth daily.     .  indomethacin (INDOCIN) 25 MG capsule  Take 25 mg by mouth 2 (two) times daily as needed.     .  metoprolol succinate (TOPROL-XL) 100 MG 24 hr tablet  Take 2 tablets (200 mg total) by mouth daily.  180 tablet  3   .  propranolol (INDERAL) 10 MG tablet  Take 1 tablet (10 mg total) by mouth 4 (four) times daily as needed (palpitations).  120 tablet  3   .  Rivaroxaban (XARELTO) 20 MG TABS  Take 1 tablet (20 mg total) by mouth daily.  30 tablet  11   .  spironolactone (ALDACTONE) 25 MG tablet  Take 2 tablets (50 mg total) by mouth daily.  180 tablet  3   .  torsemide (DEMADEX) 20 MG tablet  Take 1 tablet (20 mg total) by mouth daily.  90 tablet  3    Allergies   Allergen  Reactions   .  Amiodarone      Dizziness, halucinations   .  Azithromycin     Past Medical History   Diagnosis  Date   .  Constrictive pericarditis  2 status post [facility]ardial stripping at the [facility]   .  Atrial fibrillation      Status post TEE cardioversion   .  Spastic dysphonia    .  Supraventricular tachycardia      Status post RF ablation by Dr. Anabel Halon    No past surgical history on file.  History   Smoking status   .  Never Smoker   Smokeless tobacco   .  Not on file    History   Alcohol Use:  Not on file    No family history on file.  Reviw of Systems:  Reviewed in the HPI. All other systems are negative.  Physical Exam:  Blood pressure 102/63, pulse 114, height 5\' 4"  (1.626 m), weight 182 lb 12.8 oz (82.918 kg), SpO2 96.00%.  General: Well developed, well nourished, in no acute distress. She has spastic dysphonia.  Head: Normocephalic, atraumatic, sclera non-icteric, mucus membranes are moist  Neck: Supple. Negative for carotid bruits. JVD not elevated.   Lungs: Clear bilaterally to auscultation without wheezes, rales, or rhonchi. Breathing is unlabored.  Heart: irregularly irregular with S1 S2. No murmurs, rubs, or gallops appreciated.  Abdomen: Soft, non-tender, non-distended with normoactive bowel sounds. No hepatomegaly. No rebound/guarding. No obvious abdominal masses.  Msk: Strength and tone appear normal for age.  Extremities: No clubbing or cyanosis. No edema. Distal pedal pulses are 2+ and equal bilaterally.  Neuro: Alert and oriented X 3. Moves all extremities spontaneously.  Psych: Responds to questions appropriately with a normal affect.  ECG:  October 19, 2012: Atrial fibrillation with a rapid ventricular response. She has nonspecific ST and T wave changes.  Assessment / Plan:   She presents with persistant Atrial fib.   We will schedule her for a cardioversion.

## 2012-11-21 NOTE — Anesthesia Preprocedure Evaluation (Addendum)
Anesthesia Evaluation  Patient identified by MRN, date of birth, ID band Patient awake    Reviewed: Allergy & Precautions, H&P , NPO status , Patient's Chart, lab work & pertinent test results, reviewed documented beta blocker date and time   Airway Mallampati: II TM Distance: >3 FB Neck ROM: Full    Dental  (+) Teeth Intact and Dental Advisory Given   Pulmonary neg pulmonary ROS,  breath sounds clear to auscultation        Cardiovascular hypertension, Pt. on medications and Pt. on home beta blockers + dysrhythmias Atrial Fibrillation Rhythm:Irregular Rate:Tachycardia     Neuro/Psych    GI/Hepatic negative GI ROS, Liver Scarring   Endo/Other  negative endocrine ROS  Renal/GU      Musculoskeletal   Abdominal (+) + obese,   Peds  Hematology negative hematology ROS (+)   Anesthesia Other Findings   Reproductive/Obstetrics                           Anesthesia Physical Anesthesia Plan  ASA: III  Anesthesia Plan: General   Post-op Pain Management:    Induction: Intravenous  Airway Management Planned: Mask  Additional Equipment:   Intra-op Plan:   Post-operative Plan: Extubation in OR  Informed Consent: I have reviewed the patients History and Physical, chart, labs and discussed the procedure including the risks, benefits and alternatives for the proposed anesthesia with the patient or authorized representative who has indicated his/her understanding and acceptance.   Dental advisory given  Plan Discussed with: Anesthesiologist, Surgeon and CRNA  Anesthesia Plan Comments:        Anesthesia Quick Evaluation

## 2012-11-21 NOTE — CV Procedure (Signed)
Electrical Cardioversion Procedure Note Yolanda Bell [id number] Aug 23, 1941  Procedure: Electrical Cardioversion Indications:  Atrial Fibrillation  Procedure Details Consent: Risks of procedure as well as the alternatives and risks of each were explained to the (patient/caregiver).  Consent for procedure obtained. Time Out: Verified patient identification, verified procedure, site/side was marked, verified correct patient position, special equipment/implants available, medications/allergies/relevent history reviewed, required imaging and test results available.  Performed  Patient placed on cardiac monitor, pulse oximetry, supplemental oxygen as necessary.  Sedation given: propofol Pacer pads placed anterior and posterior chest.  Cardioverted 1 time(s).  Cardioverted at 120J.  Evaluation Findings: Post procedure EKG shows: NSR Complications: None Patient did tolerate procedure well.   Yolanda Bell 11/21/2012, 11:57 AM

## 2012-11-22 ENCOUNTER — Encounter (HOSPITAL_COMMUNITY): Payer: Self-pay | Admitting: Cardiology

## 2012-11-22 ENCOUNTER — Encounter (HOSPITAL_COMMUNITY): Payer: BC Managed Care – PPO

## 2012-11-23 ENCOUNTER — Encounter (HOSPITAL_COMMUNITY): Payer: BC Managed Care – PPO

## 2012-11-24 ENCOUNTER — Ambulatory Visit: Payer: Medicare Other | Admitting: Cardiovascular Disease

## 2012-11-27 ENCOUNTER — Telehealth: Payer: Self-pay | Admitting: Cardiovascular Disease

## 2012-11-27 MED ORDER — METOPROLOL SUCCINATE ER 100 MG PO TB24: 100.0000 mg | ORAL_TABLET | Freq: Every day | ORAL | Status: DC

## 2012-11-27 NOTE — Telephone Encounter (Signed)
Since cardioversion pt was taking propranolol 10 mg, told her to stop daily use and take prn as originally rx'd. She also wonders if she should continue metoprolol 200 mg daily, bp 136/71 p 56, 142/70 p 54, will consult dr Elease Hashimoto and return call, pt agreed to plan. METOPROLOL  WILL GO BACK DOWN TO 100 MG DAILY, GOAL HR 70. PT INFORMED/MAR ADJUSTED

## 2012-11-27 NOTE — Telephone Encounter (Signed)
New problem   S/p cardioversion on last Tuesday.    Questions regarding medication.

## 2012-11-28 ENCOUNTER — Encounter (HOSPITAL_COMMUNITY): Payer: BC Managed Care – PPO

## 2012-11-29 ENCOUNTER — Encounter (HOSPITAL_COMMUNITY): Payer: BC Managed Care – PPO

## 2012-11-30 ENCOUNTER — Encounter (HOSPITAL_COMMUNITY): Payer: BC Managed Care – PPO

## 2012-12-05 ENCOUNTER — Encounter (HOSPITAL_COMMUNITY): Payer: BC Managed Care – PPO

## 2012-12-06 ENCOUNTER — Encounter (HOSPITAL_COMMUNITY): Payer: BC Managed Care – PPO

## 2012-12-07 ENCOUNTER — Encounter (HOSPITAL_COMMUNITY): Payer: BC Managed Care – PPO

## 2012-12-11 ENCOUNTER — Telehealth: Payer: Self-pay | Admitting: Cardiovascular Disease

## 2012-12-11 NOTE — Telephone Encounter (Signed)
New problem    Call patient to move appt due to office closing at  10 am. Patient would like the nurse to call due to cardiac rehab issues.

## 2012-12-12 ENCOUNTER — Telehealth (HOSPITAL_COMMUNITY): Payer: Self-pay | Admitting: *Deleted

## 2012-12-12 ENCOUNTER — Ambulatory Visit: Payer: Medicare Other | Admitting: Cardiovascular Disease

## 2012-12-12 ENCOUNTER — Encounter (HOSPITAL_COMMUNITY): Payer: BC Managed Care – PPO

## 2012-12-12 DIAGNOSIS — I311 Chronic constrictive pericarditis: Secondary | ICD-10-CM | POA: Insufficient documentation

## 2012-12-12 DIAGNOSIS — Z5189 Encounter for other specified aftercare: Secondary | ICD-10-CM | POA: Insufficient documentation

## 2012-12-12 NOTE — Telephone Encounter (Signed)
Pt needs note to return to cardiac rehab, pt states she is feeling well, bp 130-150/ 60's, pulse regular at 50-60's, per Dr Elease Hashimoto she can return to rehab and I will forward note to Cristy Hilts RN. To inform her.

## 2012-12-13 ENCOUNTER — Encounter (HOSPITAL_COMMUNITY): Payer: BC Managed Care – PPO

## 2012-12-14 ENCOUNTER — Encounter (HOSPITAL_COMMUNITY)
Admission: RE | Admit: 2012-12-14 | Discharge: 2012-12-14 | Disposition: A | Discharge status: Home / Self Care | Payer: BC Managed Care – PPO | Source: Ambulatory Visit | Attending: Cardiovascular Disease | Admitting: Cardiovascular Disease

## 2012-12-19 ENCOUNTER — Encounter (HOSPITAL_COMMUNITY)
Admission: RE | Admit: 2012-12-19 | Discharge: 2012-12-19 | Disposition: A | Discharge status: Home / Self Care | Payer: BC Managed Care – PPO | Source: Ambulatory Visit | Attending: Cardiovascular Disease | Admitting: Cardiovascular Disease

## 2012-12-20 ENCOUNTER — Encounter (HOSPITAL_COMMUNITY): Payer: BC Managed Care – PPO

## 2012-12-21 ENCOUNTER — Encounter (HOSPITAL_COMMUNITY): Payer: BC Managed Care – PPO

## 2012-12-26 ENCOUNTER — Encounter (HOSPITAL_COMMUNITY): Payer: BC Managed Care – PPO

## 2012-12-27 ENCOUNTER — Encounter (HOSPITAL_COMMUNITY): Payer: BC Managed Care – PPO

## 2012-12-28 ENCOUNTER — Encounter (HOSPITAL_COMMUNITY)
Admission: RE | Admit: 2012-12-28 | Discharge: 2012-12-28 | Disposition: A | Discharge status: Home / Self Care | Payer: BC Managed Care – PPO | Source: Ambulatory Visit | Attending: Cardiovascular Disease | Admitting: Cardiovascular Disease

## 2013-01-01 ENCOUNTER — Ambulatory Visit (INDEPENDENT_AMBULATORY_CARE_PROVIDER_SITE_OTHER): Payer: Medicare Other | Admitting: Cardiovascular Disease

## 2013-01-01 ENCOUNTER — Encounter: Payer: Self-pay | Admitting: Cardiovascular Disease

## 2013-01-01 VITALS — BP 124/82 | HR 65 | Ht 64.0 in | Wt 183.1 lb

## 2013-01-01 DIAGNOSIS — I4891 Unspecified atrial fibrillation: Secondary | ICD-10-CM

## 2013-01-01 MED ORDER — ASPIRIN 325 MG PO TABS: 325.0000 mg | ORAL_TABLET | Freq: Every day | ORAL | Status: DC

## 2013-01-01 NOTE — Progress Notes (Signed)
Yolanda Bell Date of Birth  1941-02-10 [facility]     [place] Office  [address]    [address]   [address] [place], [place]  [address]    [place], [place]  [address] [phone]  Fax  [phone]  [phone]  Fax [phone]  Problems: 1.  Constrictive Pericarditis 2.  A-Flutter / Fibrillation 3.  Gout 4.  Spastic dysphonia 5. SVT 6. HTN  History of Present Illness:  She has done well from a cardiac standpoint.  She has had a flare up of gout which has affected both feet.   She has been taking indocin for the flare ups.    She had a bad accident in February. She was getting off an airplane and tripped on the off ramp in [place].  She broke both shoulders, left thumb and had soft tissue injury to her left foot.  She was treated conservatively (bilateral arm slings).   Her left thumb fracture was not found until she returned to [place].   She had some tachypalpitations for 22 hours during her recovery.  Her HR was 130-140 for 22 hours and resolved spontaneously.   I suspect she had a brief episode of atrial flutter.  She was quite emotional when she was telling me the issues of her fall. It is  apparently still is quite upsetting to her.  October 19, 2012: She presents today for further evaluation of atrial fib / flutter that was found at cardiac rehab.  She really cannot tell that her HR is fast and irregular.   She can feel her HR when she is lying down.  She is asymptomatic when she is up and busy doing chores.  She denies any chest pain or dyspnea.   Feb. 4, 2014: Yolanda Bell seems to have rapid atrial fibrillation. [name] on relatively high dose of metoprolol XL and also is on when necessary propranolol. He is tolerating the atrial ablation but certainly does not feel as well as she would like to. [name] here to schedule her cardioversion next week. She denies any angina. She does have some shortness of breath.  January 01, 2013:  She has maintained NSR  since her cardioversion ( Feb. 11, 2014) .   She was quite frustrated with her recent Out patient area / endoscopy department  cardioversion.  Lots of computer issues / scheduling issues.    She has done well from a cardiac standpoint.    Current Outpatient Prescriptions on File Prior to Visit  Medication Sig Dispense Refill  . Cholecalciferol (VITAMIN D PO) Take 500 mg by mouth daily.       . Difluprednate (DUREZOL) 0.05 % EMUL Apply 1 drop to eye 2 (two) times daily.      . metoprolol succinate (TOPROL-XL) 100 MG 24 hr tablet Take 1 tablet (100 mg total) by mouth daily.  30 tablet  6  . Misc Natural Products (GLUCOSAMINE CHOND COMPLEX/MSM) TABS Take 1 tablet by mouth 2 (two) times daily. Triple strength      . moxifloxacin (VIGAMOX) 0.5 % ophthalmic solution Place 1 drop into the right eye 3 (three) times daily.      . propranolol (INDERAL) 10 MG tablet Take 1 tablet (10 mg total) by mouth 4 (four) times daily as needed (palpitations).  120 tablet  3  . Rivaroxaban (XARELTO) 20 MG TABS Take 1 tablet (20 mg total) by mouth daily.  30 tablet  11  . spironolactone (ALDACTONE) 25 MG tablet  Take 50 mg by mouth every morning.      . torsemide (DEMADEX) 20 MG tablet Take 20 mg by mouth every morning.       No current facility-administered medications on file prior to visit.    Allergies  Allergen Reactions  . Amiodarone     Dizziness, halucinations  . Other Other (See Comments)    Cannot take STEROIDS-unknown reaction-caused by internal scarring on liver.    . Azithromycin Nausea And Vomiting and Rash    Past Medical History  Diagnosis Date  . Constrictive pericarditis     2 status post pericardial stripping at the [facility]  . Atrial fibrillation     Status post TEE cardioversion  . Spastic dysphonia   . Supraventricular tachycardia     Status post RF ablation by Dr. Anabel Halon    Past Surgical History  Procedure Laterality Date  . Cardioversion  11/21/2012    Procedure:  CARDIOVERSION;  Surgeon: [name], MD;  Location: [facility] ENDOSCOPY;  Service: Cardiovascular;;    History  Smoking status  . Never Smoker   Smokeless tobacco  . Not on file    History  Alcohol Use: Not on file    No family history on file.  Reviw of Systems:  Reviewed in the HPI.  All other systems are negative.  Physical Exam: Blood pressure 124/82, pulse 65, height 5\' 4"  (1.626 m), weight 183 lb 1.9 oz (83.063 kg). General: Well developed, well nourished, in no acute distress.  She has spastic dysphonia. Head: Normocephalic, atraumatic, sclera non-icteric, mucus membranes are moist  Neck: Supple. Negative for carotid bruits. JVD not elevated.  Lungs: Clear bilaterally to auscultation without wheezes, rales, or rhonchi. Breathing is unlabored.  Heart: irregularly irregular with S1 S2. No murmurs, rubs, or gallops appreciated.  Abdomen: Soft, non-tender, non-distended with normoactive bowel sounds. No hepatomegaly. No rebound/guarding. No obvious abdominal masses.  Msk:  Strength and tone appear normal for age.  Extremities: No clubbing or cyanosis. No edema.  Distal pedal pulses are 2+ and equal bilaterally.  Neuro: Alert and oriented X 3. Moves all extremities spontaneously.  Psych:  Responds to questions appropriately with a normal affect.  ECG: January 01, 2013:  NSR at 65, normal ECG.  Assessment / Plan:

## 2013-01-01 NOTE — Assessment & Plan Note (Signed)
At this point, we will stop her Xarelto and start ASA 325 mg a day.  She will watch her heart rhythm closely- she can tell when when she is in AF.

## 2013-01-01 NOTE — Patient Instructions (Signed)
Your physician wants you to follow-up in: 6 months You will receive a reminder letter in the mail two months in advance. If you don't receive a letter, please call our office to schedule the follow-up appointment.  Your physician has recommended you make the following change in your medication:   Stop xarelto- completed therapy Start aspirin 325 mg daily

## 2013-01-02 ENCOUNTER — Encounter (HOSPITAL_COMMUNITY): Payer: BC Managed Care – PPO

## 2013-01-03 ENCOUNTER — Encounter (HOSPITAL_COMMUNITY): Payer: BC Managed Care – PPO

## 2013-01-04 ENCOUNTER — Encounter (HOSPITAL_COMMUNITY): Payer: BC Managed Care – PPO

## 2013-01-09 ENCOUNTER — Encounter (HOSPITAL_COMMUNITY)
Admission: RE | Admit: 2013-01-09 | Discharge: 2013-01-09 | Disposition: A | Discharge status: Home / Self Care | Payer: Self-pay | Source: Ambulatory Visit | Attending: Cardiovascular Disease | Admitting: Cardiovascular Disease

## 2013-01-09 DIAGNOSIS — I311 Chronic constrictive pericarditis: Secondary | ICD-10-CM | POA: Insufficient documentation

## 2013-01-09 DIAGNOSIS — Z5189 Encounter for other specified aftercare: Secondary | ICD-10-CM | POA: Insufficient documentation

## 2013-01-10 ENCOUNTER — Encounter (HOSPITAL_COMMUNITY): Payer: Self-pay

## 2013-01-11 ENCOUNTER — Encounter (HOSPITAL_COMMUNITY)
Admission: RE | Admit: 2013-01-11 | Discharge: 2013-01-11 | Disposition: A | Discharge status: Home / Self Care | Payer: Self-pay | Source: Ambulatory Visit | Attending: Cardiovascular Disease | Admitting: Cardiovascular Disease

## 2013-01-16 ENCOUNTER — Encounter (HOSPITAL_COMMUNITY)
Admission: RE | Admit: 2013-01-16 | Discharge: 2013-01-16 | Disposition: A | Discharge status: Home / Self Care | Payer: Self-pay | Source: Ambulatory Visit | Attending: Cardiovascular Disease | Admitting: Cardiovascular Disease

## 2013-01-17 ENCOUNTER — Encounter (HOSPITAL_COMMUNITY): Payer: Self-pay

## 2013-01-18 ENCOUNTER — Encounter (HOSPITAL_COMMUNITY)
Admission: RE | Admit: 2013-01-18 | Discharge: 2013-01-18 | Disposition: A | Discharge status: Home / Self Care | Payer: Self-pay | Source: Ambulatory Visit | Attending: Cardiovascular Disease | Admitting: Cardiovascular Disease

## 2013-01-23 ENCOUNTER — Encounter (HOSPITAL_COMMUNITY)
Admission: RE | Admit: 2013-01-23 | Discharge: 2013-01-23 | Disposition: A | Discharge status: Home / Self Care | Payer: Self-pay | Source: Ambulatory Visit | Attending: Cardiovascular Disease | Admitting: Cardiovascular Disease

## 2013-01-24 ENCOUNTER — Encounter (HOSPITAL_COMMUNITY): Payer: Self-pay

## 2013-01-25 ENCOUNTER — Encounter (HOSPITAL_COMMUNITY)
Admission: RE | Admit: 2013-01-25 | Discharge: 2013-01-25 | Disposition: A | Discharge status: Home / Self Care | Payer: Self-pay | Source: Ambulatory Visit | Attending: Cardiovascular Disease | Admitting: Cardiovascular Disease

## 2013-01-30 ENCOUNTER — Encounter (HOSPITAL_COMMUNITY)
Admission: RE | Admit: 2013-01-30 | Discharge: 2013-01-30 | Disposition: A | Discharge status: Home / Self Care | Payer: Self-pay | Source: Ambulatory Visit | Attending: Cardiovascular Disease | Admitting: Cardiovascular Disease

## 2013-01-31 ENCOUNTER — Encounter (HOSPITAL_COMMUNITY): Payer: Self-pay

## 2013-02-01 ENCOUNTER — Encounter (HOSPITAL_COMMUNITY)
Admission: RE | Admit: 2013-02-01 | Discharge: 2013-02-01 | Disposition: A | Discharge status: Home / Self Care | Payer: Self-pay | Source: Ambulatory Visit | Attending: Cardiovascular Disease | Admitting: Cardiovascular Disease

## 2013-02-06 ENCOUNTER — Encounter (HOSPITAL_COMMUNITY)
Admission: RE | Admit: 2013-02-06 | Discharge: 2013-02-06 | Disposition: A | Discharge status: Home / Self Care | Payer: Self-pay | Source: Ambulatory Visit | Attending: Cardiovascular Disease | Admitting: Cardiovascular Disease

## 2013-02-07 ENCOUNTER — Encounter (HOSPITAL_COMMUNITY): Payer: Self-pay

## 2013-02-08 ENCOUNTER — Encounter (HOSPITAL_COMMUNITY): Payer: Self-pay

## 2013-02-08 DIAGNOSIS — I311 Chronic constrictive pericarditis: Secondary | ICD-10-CM | POA: Insufficient documentation

## 2013-02-08 DIAGNOSIS — Z5189 Encounter for other specified aftercare: Secondary | ICD-10-CM | POA: Insufficient documentation

## 2013-02-13 ENCOUNTER — Encounter (HOSPITAL_COMMUNITY)
Admission: RE | Admit: 2013-02-13 | Discharge: 2013-02-13 | Disposition: A | Discharge status: Home / Self Care | Payer: Self-pay | Source: Ambulatory Visit | Attending: Cardiovascular Disease | Admitting: Cardiovascular Disease

## 2013-02-14 ENCOUNTER — Encounter (HOSPITAL_COMMUNITY): Payer: Self-pay

## 2013-02-15 ENCOUNTER — Encounter (HOSPITAL_COMMUNITY): Payer: Self-pay

## 2013-02-20 ENCOUNTER — Encounter (HOSPITAL_COMMUNITY)
Admission: RE | Admit: 2013-02-20 | Discharge: 2013-02-20 | Disposition: A | Discharge status: Home / Self Care | Payer: Self-pay | Source: Ambulatory Visit | Attending: Cardiovascular Disease | Admitting: Cardiovascular Disease

## 2013-02-21 ENCOUNTER — Encounter (HOSPITAL_COMMUNITY): Payer: Self-pay

## 2013-02-22 ENCOUNTER — Encounter (HOSPITAL_COMMUNITY)
Admission: RE | Admit: 2013-02-22 | Discharge: 2013-02-22 | Disposition: A | Discharge status: Home / Self Care | Payer: Self-pay | Source: Ambulatory Visit | Attending: Cardiovascular Disease | Admitting: Cardiovascular Disease

## 2013-02-27 ENCOUNTER — Encounter (HOSPITAL_COMMUNITY): Payer: Self-pay

## 2013-02-28 ENCOUNTER — Encounter (HOSPITAL_COMMUNITY): Payer: Self-pay

## 2013-03-01 ENCOUNTER — Encounter (HOSPITAL_COMMUNITY): Payer: Self-pay

## 2013-03-06 ENCOUNTER — Encounter (HOSPITAL_COMMUNITY): Payer: Self-pay

## 2013-03-07 ENCOUNTER — Encounter (HOSPITAL_COMMUNITY): Payer: Self-pay

## 2013-03-08 ENCOUNTER — Encounter (HOSPITAL_COMMUNITY)
Admission: RE | Admit: 2013-03-08 | Discharge: 2013-03-08 | Disposition: A | Discharge status: Home / Self Care | Payer: Self-pay | Source: Ambulatory Visit | Attending: Cardiovascular Disease | Admitting: Cardiovascular Disease

## 2013-03-13 ENCOUNTER — Encounter (HOSPITAL_COMMUNITY)
Admission: RE | Admit: 2013-03-13 | Discharge: 2013-03-13 | Disposition: A | Discharge status: Home / Self Care | Payer: Self-pay | Source: Ambulatory Visit | Attending: Cardiovascular Disease | Admitting: Cardiovascular Disease

## 2013-03-13 DIAGNOSIS — Z5189 Encounter for other specified aftercare: Secondary | ICD-10-CM | POA: Insufficient documentation

## 2013-03-13 DIAGNOSIS — I311 Chronic constrictive pericarditis: Secondary | ICD-10-CM | POA: Insufficient documentation

## 2013-03-14 ENCOUNTER — Encounter (HOSPITAL_COMMUNITY): Payer: Self-pay

## 2013-03-15 ENCOUNTER — Encounter (HOSPITAL_COMMUNITY)
Admission: RE | Admit: 2013-03-15 | Discharge: 2013-03-15 | Disposition: A | Discharge status: Home / Self Care | Payer: Self-pay | Source: Ambulatory Visit | Attending: Cardiovascular Disease | Admitting: Cardiovascular Disease

## 2013-03-20 ENCOUNTER — Encounter (HOSPITAL_COMMUNITY)
Admission: RE | Admit: 2013-03-20 | Discharge: 2013-03-20 | Disposition: A | Discharge status: Home / Self Care | Payer: Self-pay | Source: Ambulatory Visit | Attending: Cardiovascular Disease | Admitting: Cardiovascular Disease

## 2013-03-21 ENCOUNTER — Encounter (HOSPITAL_COMMUNITY): Payer: Self-pay

## 2013-03-22 ENCOUNTER — Encounter (HOSPITAL_COMMUNITY)
Admission: RE | Admit: 2013-03-22 | Discharge: 2013-03-22 | Disposition: A | Discharge status: Home / Self Care | Payer: Self-pay | Source: Ambulatory Visit | Attending: Cardiovascular Disease | Admitting: Cardiovascular Disease

## 2013-03-27 ENCOUNTER — Encounter (HOSPITAL_COMMUNITY)
Admission: RE | Admit: 2013-03-27 | Discharge: 2013-03-27 | Disposition: A | Discharge status: Home / Self Care | Payer: Self-pay | Source: Ambulatory Visit | Attending: Cardiovascular Disease | Admitting: Cardiovascular Disease

## 2013-03-28 ENCOUNTER — Encounter (HOSPITAL_COMMUNITY): Payer: Self-pay

## 2013-03-29 ENCOUNTER — Encounter (HOSPITAL_COMMUNITY)
Admission: RE | Admit: 2013-03-29 | Discharge: 2013-03-29 | Disposition: A | Discharge status: Home / Self Care | Payer: Self-pay | Source: Ambulatory Visit | Attending: Cardiovascular Disease | Admitting: Cardiovascular Disease

## 2013-04-03 ENCOUNTER — Encounter (HOSPITAL_COMMUNITY): Payer: Self-pay

## 2013-04-04 ENCOUNTER — Encounter (HOSPITAL_COMMUNITY): Payer: Self-pay

## 2013-04-05 ENCOUNTER — Encounter (HOSPITAL_COMMUNITY)
Admission: RE | Admit: 2013-04-05 | Discharge: 2013-04-05 | Disposition: A | Discharge status: Home / Self Care | Payer: Self-pay | Source: Ambulatory Visit | Attending: Cardiovascular Disease | Admitting: Cardiovascular Disease

## 2013-04-10 ENCOUNTER — Encounter (HOSPITAL_COMMUNITY): Payer: Self-pay

## 2013-04-10 DIAGNOSIS — Z5189 Encounter for other specified aftercare: Secondary | ICD-10-CM | POA: Insufficient documentation

## 2013-04-10 DIAGNOSIS — I311 Chronic constrictive pericarditis: Secondary | ICD-10-CM | POA: Insufficient documentation

## 2013-04-11 ENCOUNTER — Encounter (HOSPITAL_COMMUNITY): Payer: Self-pay

## 2013-04-12 ENCOUNTER — Encounter (HOSPITAL_COMMUNITY)
Admission: RE | Admit: 2013-04-12 | Discharge: 2013-04-12 | Disposition: A | Discharge status: Home / Self Care | Payer: Self-pay | Source: Ambulatory Visit | Attending: Cardiovascular Disease | Admitting: Cardiovascular Disease

## 2013-04-17 ENCOUNTER — Encounter (HOSPITAL_COMMUNITY)
Admission: RE | Admit: 2013-04-17 | Discharge: 2013-04-17 | Disposition: A | Discharge status: Home / Self Care | Payer: Self-pay | Source: Ambulatory Visit | Attending: Cardiovascular Disease | Admitting: Cardiovascular Disease

## 2013-04-18 ENCOUNTER — Encounter (HOSPITAL_COMMUNITY): Payer: Self-pay

## 2013-04-19 ENCOUNTER — Encounter (HOSPITAL_COMMUNITY)
Admission: RE | Admit: 2013-04-19 | Discharge: 2013-04-19 | Disposition: A | Discharge status: Home / Self Care | Payer: Self-pay | Source: Ambulatory Visit | Attending: Cardiovascular Disease | Admitting: Cardiovascular Disease

## 2013-04-24 ENCOUNTER — Encounter (HOSPITAL_COMMUNITY)
Admission: RE | Admit: 2013-04-24 | Discharge: 2013-04-24 | Disposition: A | Discharge status: Home / Self Care | Payer: Self-pay | Source: Ambulatory Visit | Attending: Cardiovascular Disease | Admitting: Cardiovascular Disease

## 2013-04-25 ENCOUNTER — Encounter (HOSPITAL_COMMUNITY): Payer: Self-pay

## 2013-04-26 ENCOUNTER — Encounter (HOSPITAL_COMMUNITY): Payer: Self-pay

## 2013-05-01 ENCOUNTER — Encounter (HOSPITAL_COMMUNITY)
Admission: RE | Admit: 2013-05-01 | Discharge: 2013-05-01 | Disposition: A | Discharge status: Home / Self Care | Payer: Self-pay | Source: Ambulatory Visit | Attending: Cardiovascular Disease | Admitting: Cardiovascular Disease

## 2013-05-02 ENCOUNTER — Encounter (HOSPITAL_COMMUNITY): Payer: Self-pay

## 2013-05-03 ENCOUNTER — Encounter (HOSPITAL_COMMUNITY)
Admission: RE | Admit: 2013-05-03 | Discharge: 2013-05-03 | Disposition: A | Discharge status: Home / Self Care | Payer: Self-pay | Source: Ambulatory Visit | Attending: Cardiovascular Disease | Admitting: Cardiovascular Disease

## 2013-05-08 ENCOUNTER — Encounter (HOSPITAL_COMMUNITY)
Admission: RE | Admit: 2013-05-08 | Discharge: 2013-05-08 | Disposition: A | Discharge status: Home / Self Care | Payer: Self-pay | Source: Ambulatory Visit | Attending: Cardiovascular Disease | Admitting: Cardiovascular Disease

## 2013-05-09 ENCOUNTER — Encounter (HOSPITAL_COMMUNITY): Payer: Self-pay

## 2013-05-10 ENCOUNTER — Encounter (HOSPITAL_COMMUNITY)
Admission: RE | Admit: 2013-05-10 | Discharge: 2013-05-10 | Disposition: A | Discharge status: Home / Self Care | Payer: Self-pay | Source: Ambulatory Visit | Attending: Cardiovascular Disease | Admitting: Cardiovascular Disease

## 2013-05-15 ENCOUNTER — Encounter (HOSPITAL_COMMUNITY)
Admission: RE | Admit: 2013-05-15 | Discharge: 2013-05-15 | Disposition: A | Discharge status: Home / Self Care | Payer: Medicare Other | Source: Ambulatory Visit | Attending: Cardiovascular Disease | Admitting: Cardiovascular Disease

## 2013-05-15 DIAGNOSIS — I311 Chronic constrictive pericarditis: Secondary | ICD-10-CM | POA: Insufficient documentation

## 2013-05-15 DIAGNOSIS — Z5189 Encounter for other specified aftercare: Secondary | ICD-10-CM | POA: Insufficient documentation

## 2013-05-16 ENCOUNTER — Encounter (HOSPITAL_COMMUNITY): Payer: Medicare Other

## 2013-05-17 ENCOUNTER — Encounter (HOSPITAL_COMMUNITY)
Admission: RE | Admit: 2013-05-17 | Discharge: 2013-05-17 | Disposition: A | Discharge status: Home / Self Care | Payer: Medicare Other | Source: Ambulatory Visit | Attending: Cardiovascular Disease | Admitting: Cardiovascular Disease

## 2013-05-22 ENCOUNTER — Encounter (HOSPITAL_COMMUNITY): Payer: Medicare Other

## 2013-05-23 ENCOUNTER — Encounter (HOSPITAL_COMMUNITY): Payer: Medicare Other

## 2013-05-24 ENCOUNTER — Encounter (HOSPITAL_COMMUNITY)
Admission: RE | Admit: 2013-05-24 | Discharge: 2013-05-24 | Disposition: A | Discharge status: Home / Self Care | Payer: Medicare Other | Source: Ambulatory Visit | Attending: Cardiovascular Disease | Admitting: Cardiovascular Disease

## 2013-05-29 ENCOUNTER — Encounter (HOSPITAL_COMMUNITY)
Admission: RE | Admit: 2013-05-29 | Discharge: 2013-05-29 | Disposition: A | Discharge status: Home / Self Care | Payer: Medicare Other | Source: Ambulatory Visit | Attending: Cardiovascular Disease | Admitting: Cardiovascular Disease

## 2013-05-30 ENCOUNTER — Encounter (HOSPITAL_COMMUNITY): Payer: Medicare Other

## 2013-05-31 ENCOUNTER — Encounter (HOSPITAL_COMMUNITY)
Admission: RE | Admit: 2013-05-31 | Discharge: 2013-05-31 | Disposition: A | Discharge status: Home / Self Care | Payer: Medicare Other | Source: Ambulatory Visit | Attending: Cardiovascular Disease | Admitting: Cardiovascular Disease

## 2013-06-05 ENCOUNTER — Encounter (HOSPITAL_COMMUNITY): Payer: Medicare Other

## 2013-06-06 ENCOUNTER — Encounter (HOSPITAL_COMMUNITY): Payer: Medicare Other

## 2013-06-07 ENCOUNTER — Encounter (HOSPITAL_COMMUNITY): Payer: Medicare Other

## 2013-06-12 ENCOUNTER — Encounter (HOSPITAL_COMMUNITY): Payer: Medicare Other

## 2013-06-13 ENCOUNTER — Encounter (HOSPITAL_COMMUNITY): Payer: Medicare Other

## 2013-06-14 ENCOUNTER — Encounter (HOSPITAL_COMMUNITY): Payer: Medicare Other

## 2013-06-17 ENCOUNTER — Other Ambulatory Visit: Payer: Self-pay | Admitting: Cardiovascular Disease

## 2013-06-19 ENCOUNTER — Encounter (HOSPITAL_COMMUNITY): Payer: Medicare Other

## 2013-06-20 ENCOUNTER — Encounter (HOSPITAL_COMMUNITY): Payer: Medicare Other

## 2013-06-21 ENCOUNTER — Encounter (HOSPITAL_COMMUNITY): Payer: Medicare Other

## 2013-06-26 ENCOUNTER — Encounter (HOSPITAL_COMMUNITY): Payer: Medicare Other

## 2013-06-27 ENCOUNTER — Encounter (HOSPITAL_COMMUNITY): Payer: Medicare Other

## 2013-06-28 ENCOUNTER — Encounter (HOSPITAL_COMMUNITY): Payer: Medicare Other

## 2013-07-02 ENCOUNTER — Encounter: Payer: Self-pay | Admitting: Cardiovascular Disease

## 2013-07-02 ENCOUNTER — Ambulatory Visit (INDEPENDENT_AMBULATORY_CARE_PROVIDER_SITE_OTHER): Payer: Medicare Other | Admitting: Cardiovascular Disease

## 2013-07-02 VITALS — BP 130/70 | HR 58 | Ht 64.0 in | Wt 186.8 lb

## 2013-07-02 DIAGNOSIS — I4891 Unspecified atrial fibrillation: Secondary | ICD-10-CM

## 2013-07-02 DIAGNOSIS — I311 Chronic constrictive pericarditis: Secondary | ICD-10-CM

## 2013-07-02 DIAGNOSIS — I10 Essential (primary) hypertension: Secondary | ICD-10-CM

## 2013-07-02 MED ORDER — SPIRONOLACTONE 25 MG PO TABS: 25.0000 mg | ORAL_TABLET | Freq: Two times a day (BID) | ORAL | Status: DC

## 2013-07-02 MED ORDER — TORSEMIDE 20 MG PO TABS: 20.0000 mg | ORAL_TABLET | Freq: Every day | ORAL | Status: DC

## 2013-07-02 NOTE — Assessment & Plan Note (Signed)
She is doing well.  No cardiac problems.

## 2013-07-02 NOTE — Assessment & Plan Note (Signed)
She is still in NSR.

## 2013-07-02 NOTE — Patient Instructions (Addendum)
Your physician recommends that you return for a FASTING lipid profile: tomorrow  Your physician wants you to follow-up in: 6 months You will receive a reminder letter in the mail two months in advance. If you don't receive a letter, please call our office to schedule the follow-up appointment.  Your physician recommends that you continue on your current medications as directed. Please refer to the Current Medication list given to you today.

## 2013-07-02 NOTE — Assessment & Plan Note (Signed)
Yolanda Bell is doing well. [name] not had any episodes of chest pain or shortness breath. She needs to get out and exercise a little bit more.  Her oxygen saturation is slightly low today but she thinks she was not breathing as well sh she should. [name] not having any shortness of breath with her normal activities.

## 2013-07-02 NOTE — Progress Notes (Signed)
[name] Date of Birth  12/31/1940 [facility]     [place] [facility]  [address]    [address]   [address] [place], [place]  [address]    [place], [place]  [address] [phone]  Fax  [phone]  [phone]  Fax [phone]  Problems: 1.  Constrictive Pericarditis 2.  A-Flutter / Fibrillation 3.  Gout 4.  Spastic dysphonia 5. SVT 6. HTN  History of Present Illness:  She has done well from a cardiac standpoint.  She has had a flare up of gout which has affected both feet.   She has been taking indocin for the flare ups.    She had a bad accident in February. She was getting off an airplane and tripped on the off ramp in [place].  She broke both shoulders, left thumb and had soft tissue injury to her left foot.  She was treated conservatively (bilateral arm slings).   Her left thumb fracture was not found until she returned to [place].   She had some tachypalpitations for 22 hours during her recovery.  Her HR was 130-140 for 22 hours and resolved spontaneously.   I suspect she had a brief episode of atrial flutter.  She was quite emotional when she was telling me the issues of her fall. It is  apparently still is quite upsetting to her.  October 19, 2012: She presents today for further evaluation of atrial fib / flutter that was found at cardiac rehab.  She really cannot tell that her HR is fast and irregular.   She can feel her HR when she is lying down.  She is asymptomatic when she is up and busy doing chores.  She denies any chest pain or dyspnea.   Feb. 4, 2014: [name] seems to have rapid atrial fibrillation. [name] on relatively high dose of metoprolol XL and also is on when necessary propranolol. He is tolerating the atrial ablation but certainly does not feel as well as she would like to. [name] here to schedule her cardioversion next week. She denies any angina. She does have some shortness of breath.  January 01, 2013:  She has maintained NSR  since her cardioversion ( Feb. 11, 2014) .   She was quite frustrated with her recent Out patient area / endoscopy department  cardioversion.  Lots of computer issues / scheduling issues.    She has done well from a cardiac standpoint.    Sept. 22, 2014:  She has done well.   She had some redness and tenderness just below her right eye.  She is not having any cardiac problems.     Current Outpatient Prescriptions on File Prior to Visit  Medication Sig Dispense Refill  . aspirin 325 MG tablet Take 1 tablet (325 mg total) by mouth daily.      . metoprolol succinate (TOPROL-XL) 100 MG 24 hr tablet TAKE 1 TABLET (100 MG TOTAL) BY MOUTH DAILY.  30 tablet  0  . Misc Natural Products (GLUCOSAMINE CHOND COMPLEX/MSM) TABS Take 1 tablet by mouth 2 (two) times daily. Triple strength      . propranolol (INDERAL) 10 MG tablet Take 1 tablet (10 mg total) by mouth 4 (four) times daily as needed (palpitations).  120 tablet  3  . spironolactone (ALDACTONE) 25 MG tablet Take 25 mg by mouth 2 (two) times daily.       Marland Kitchen torsemide (DEMADEX) 20 MG tablet Take 20 mg by mouth every morning.  No current facility-administered medications on file prior to visit.    Allergies  Allergen Reactions  . Amiodarone     Dizziness, halucinations  . Other Other (See Comments)    Cannot take STEROIDS-unknown reaction-caused by internal scarring on liver.    . Azithromycin Nausea And Vomiting and Rash    Past Medical History  Diagnosis Date  . Constrictive pericarditis     2 status post pericardial stripping at the [facility]  . Atrial fibrillation     Status post TEE cardioversion  . Spastic dysphonia   . Supraventricular tachycardia     Status post RF ablation by Dr. Anabel Halon    Past Surgical History  Procedure Laterality Date  . Cardioversion  11/21/2012    Procedure: CARDIOVERSION;  Surgeon: [name], MD;  Location: [facility] ENDOSCOPY;  Service: Cardiovascular;;    History  Smoking status  .  Never Smoker   Smokeless tobacco  . Not on file    History  Alcohol Use: Not on file    No family history on file.  Reviw of Systems:  Reviewed in the HPI.  All other systems are negative.  Physical Exam: Blood pressure 130/70, pulse 58, height 5\' 4"  (1.626 m), weight 186 lb 12.8 oz (84.732 kg), SpO2 91.00%. General: Well developed, well nourished, in no acute distress.  She has spastic dysphonia. Head: Normocephalic, atraumatic, sclera non-icteric, mucus membranes are moist  Neck: Supple. Negative for carotid bruits. JVD not elevated.  Lungs: Clear bilaterally to auscultation without wheezes, rales, or rhonchi. Breathing is unlabored.  Heart: irregularly irregular with S1 S2. No murmurs, rubs, or gallops appreciated.  Abdomen: Soft, non-tender, non-distended with normoactive bowel sounds. No hepatomegaly. No rebound/guarding. No obvious abdominal masses.  Msk:  Strength and tone appear normal for age.  Extremities: No clubbing or cyanosis. No edema.  Distal pedal pulses are 2+ and equal bilaterally.  Neuro: Alert and oriented X 3. Moves all extremities spontaneously.  Psych:  Responds to questions appropriately with a normal affect.  ECG: January 01, 2013:  NSR at 67, normal ECG.  Assessment / Plan:

## 2013-07-03 ENCOUNTER — Other Ambulatory Visit (INDEPENDENT_AMBULATORY_CARE_PROVIDER_SITE_OTHER): Payer: Medicare Other

## 2013-07-03 ENCOUNTER — Encounter (HOSPITAL_COMMUNITY): Payer: Medicare Other

## 2013-07-03 DIAGNOSIS — I10 Essential (primary) hypertension: Secondary | ICD-10-CM

## 2013-07-03 DIAGNOSIS — I311 Chronic constrictive pericarditis: Secondary | ICD-10-CM

## 2013-07-03 DIAGNOSIS — I4891 Unspecified atrial fibrillation: Secondary | ICD-10-CM

## 2013-07-03 LAB — LIPID PANEL
Cholesterol: 167 mg/dL (ref 0–200)
HDL: 38.8 mg/dL — ABNORMAL LOW (ref >39.00)
Triglycerides: 138 mg/dL (ref 0.0–149.0)

## 2013-07-03 LAB — BASIC METABOLIC PANEL
CO2: 29 mEq/L (ref 19–32)
Calcium: 9.1 mg/dL (ref 8.4–10.5)
Creatinine, Ser: 0.8 mg/dL (ref 0.4–1.2)
GFR: 75.25 mL/min (ref >60.00)
Sodium: 138 mEq/L (ref 135–145)

## 2013-07-04 ENCOUNTER — Encounter (HOSPITAL_COMMUNITY): Payer: Medicare Other

## 2013-07-05 ENCOUNTER — Encounter (HOSPITAL_COMMUNITY): Payer: Medicare Other

## 2013-07-10 ENCOUNTER — Encounter (HOSPITAL_COMMUNITY): Payer: Medicare Other

## 2013-07-11 ENCOUNTER — Encounter (HOSPITAL_COMMUNITY): Payer: Medicare Other

## 2013-07-12 ENCOUNTER — Encounter (HOSPITAL_COMMUNITY): Payer: Medicare Other

## 2013-07-17 ENCOUNTER — Encounter (HOSPITAL_COMMUNITY): Payer: Medicare Other

## 2013-07-18 ENCOUNTER — Encounter (HOSPITAL_COMMUNITY): Payer: Medicare Other

## 2013-07-19 ENCOUNTER — Encounter (HOSPITAL_COMMUNITY): Payer: Medicare Other

## 2013-07-20 ENCOUNTER — Other Ambulatory Visit: Payer: Self-pay | Admitting: Family Medicine

## 2013-07-20 ENCOUNTER — Ambulatory Visit
Admission: RE | Admit: 2013-07-20 | Discharge: 2013-07-20 | Disposition: A | Discharge status: Home / Self Care | Payer: Medicare Other | Source: Ambulatory Visit | Attending: Family Medicine | Admitting: Family Medicine

## 2013-07-20 DIAGNOSIS — M79605 Pain in left leg: Secondary | ICD-10-CM

## 2013-07-20 DIAGNOSIS — M7989 Other specified soft tissue disorders: Secondary | ICD-10-CM

## 2013-07-24 ENCOUNTER — Encounter (HOSPITAL_COMMUNITY): Payer: Medicare Other

## 2013-07-25 ENCOUNTER — Encounter (HOSPITAL_COMMUNITY): Payer: Medicare Other

## 2013-07-26 ENCOUNTER — Encounter (HOSPITAL_COMMUNITY): Payer: Medicare Other

## 2013-07-31 ENCOUNTER — Encounter (HOSPITAL_COMMUNITY): Payer: Medicare Other

## 2013-08-01 ENCOUNTER — Encounter (HOSPITAL_COMMUNITY): Payer: Medicare Other

## 2013-08-02 ENCOUNTER — Encounter (HOSPITAL_COMMUNITY): Payer: Medicare Other

## 2013-08-07 ENCOUNTER — Encounter (HOSPITAL_COMMUNITY): Payer: Medicare Other

## 2013-08-08 ENCOUNTER — Encounter (HOSPITAL_COMMUNITY): Payer: Medicare Other

## 2013-08-09 ENCOUNTER — Encounter (HOSPITAL_COMMUNITY): Payer: Medicare Other

## 2013-09-19 ENCOUNTER — Other Ambulatory Visit: Payer: Self-pay

## 2013-09-19 MED ORDER — METOPROLOL SUCCINATE ER 100 MG PO TB24: ORAL_TABLET | ORAL | Status: DC

## 2013-09-20 ENCOUNTER — Telehealth: Payer: Self-pay

## 2013-09-21 ENCOUNTER — Other Ambulatory Visit: Payer: Self-pay

## 2013-09-21 NOTE — Telephone Encounter (Signed)
error 

## 2013-09-25 ENCOUNTER — Telehealth: Payer: Self-pay

## 2013-09-25 ENCOUNTER — Telehealth: Payer: Self-pay | Admitting: Cardiovascular Disease

## 2013-09-25 NOTE — Telephone Encounter (Signed)
New Problem:  Pt states she is calling in about a heart episode she had a few weeks ago. Pt is requesting a call back from Jodette. Pt states she will give more details when the nurse calls. Pt also had concerns about her insurance changing in January and she wants it noted that she wants her prescriptions changed to 90 day supplies instead of 30 day supplies. Pt transferred to Pt care advocate team.

## 2013-09-25 NOTE — Telephone Encounter (Signed)
Dec 2 pt had an episode of dizziness and tachycardia brought on by bending over. States her bp was ok but HR was 134 bpm after resting for awhile.  Pt took propranolol and continued to rest that afternoon and felt when her heart rate slowed down and was back into the 60-70 bpm.  Pt c/o SOB more frequently. Pt states she is eating more salt/ eating casseroles at church. Feels wt around the middle but her wt is stable at 182-185 lb. Pt notes her BP is running a bit higher than usual 166/83. High sodium foods were reviewed to avoid and pt agreed to plan, she will call with further concerns.

## 2013-09-25 NOTE — Telephone Encounter (Signed)
Patient called to let us know that in January her rx  Needs to be for 90days

## 2013-10-17 ENCOUNTER — Other Ambulatory Visit: Payer: Self-pay

## 2013-10-17 DIAGNOSIS — I10 Essential (primary) hypertension: Secondary | ICD-10-CM

## 2013-10-17 DIAGNOSIS — I4891 Unspecified atrial fibrillation: Secondary | ICD-10-CM

## 2013-10-17 DIAGNOSIS — I311 Chronic constrictive pericarditis: Secondary | ICD-10-CM

## 2013-10-17 MED ORDER — TORSEMIDE 20 MG PO TABS: 20.0000 mg | ORAL_TABLET | Freq: Every day | ORAL | Status: DC

## 2013-10-17 MED ORDER — SPIRONOLACTONE 25 MG PO TABS: 25.0000 mg | ORAL_TABLET | Freq: Two times a day (BID) | ORAL | Status: DC

## 2013-10-17 MED ORDER — METOPROLOL SUCCINATE ER 100 MG PO TB24: ORAL_TABLET | ORAL | Status: DC

## 2013-10-18 ENCOUNTER — Other Ambulatory Visit: Payer: Self-pay | Admitting: *Deleted

## 2013-10-18 DIAGNOSIS — I10 Essential (primary) hypertension: Secondary | ICD-10-CM

## 2013-10-18 DIAGNOSIS — I311 Chronic constrictive pericarditis: Secondary | ICD-10-CM

## 2013-10-18 DIAGNOSIS — I4891 Unspecified atrial fibrillation: Secondary | ICD-10-CM

## 2013-10-18 MED ORDER — SPIRONOLACTONE 25 MG PO TABS: 25.0000 mg | ORAL_TABLET | Freq: Two times a day (BID) | ORAL | Status: DC

## 2014-01-14 ENCOUNTER — Ambulatory Visit (INDEPENDENT_AMBULATORY_CARE_PROVIDER_SITE_OTHER): Payer: 59 | Admitting: Cardiovascular Disease

## 2014-01-14 ENCOUNTER — Encounter: Payer: Self-pay | Admitting: Cardiovascular Disease

## 2014-01-14 VITALS — BP 138/66 | HR 53 | Ht 64.0 in | Wt 186.8 lb

## 2014-01-14 DIAGNOSIS — I4891 Unspecified atrial fibrillation: Secondary | ICD-10-CM

## 2014-01-14 DIAGNOSIS — I498 Other specified cardiac arrhythmias: Secondary | ICD-10-CM

## 2014-01-14 DIAGNOSIS — I471 Supraventricular tachycardia: Secondary | ICD-10-CM

## 2014-01-14 DIAGNOSIS — I10 Essential (primary) hypertension: Secondary | ICD-10-CM

## 2014-01-14 DIAGNOSIS — Z0000 Encounter for general adult medical examination without abnormal findings: Secondary | ICD-10-CM

## 2014-01-14 LAB — HEPATIC FUNCTION PANEL
ALK PHOS: 68 U/L (ref 39–117)
ALT: 12 U/L (ref 0–35)
AST: 17 U/L (ref 0–37)
Albumin: 4.4 g/dL (ref 3.5–5.2)
BILIRUBIN TOTAL: 1 mg/dL (ref 0.3–1.2)
Bilirubin, Direct: 0.1 mg/dL (ref 0.0–0.3)
Total Protein: 7.4 g/dL (ref 6.0–8.3)

## 2014-01-14 LAB — BASIC METABOLIC PANEL
BUN: 15 mg/dL (ref 6–23)
CO2: 33 mEq/L — ABNORMAL HIGH (ref 19–32)
CREATININE: 0.8 mg/dL (ref 0.4–1.2)
Calcium: 9.4 mg/dL (ref 8.4–10.5)
Chloride: 98 mEq/L (ref 96–112)
GFR: 71.02 mL/min (ref >60.00)
GLUCOSE: 92 mg/dL (ref 70–99)
POTASSIUM: 3.3 meq/L — AB (ref 3.5–5.1)
Sodium: 140 mEq/L (ref 135–145)

## 2014-01-14 LAB — LIPID PANEL
Cholesterol: 169 mg/dL (ref 0–200)
HDL: 43.2 mg/dL (ref >39.00)
LDL Cholesterol: 100 mg/dL — ABNORMAL HIGH (ref 0–99)
Total CHOL/HDL Ratio: 4
Triglycerides: 129 mg/dL (ref 0.0–149.0)
VLDL: 25.8 mg/dL (ref 0.0–40.0)

## 2014-01-14 NOTE — Assessment & Plan Note (Signed)
Discussed the treatments of SVT - propranolol, diving reflex, valsalva.   She seems to be stable.

## 2014-01-14 NOTE — Progress Notes (Signed)
Yolanda Bell Date of Birth  [birth year] [facility] [address]    [place]   [place], [place]  [address]    [place], [place]  [address] [phone]  Fax  [phone]  [phone]  Fax [phone]  Problems: 1.  Constrictive Pericarditis 2.  A-Flutter / Fibrillation 3.  Gout 4.  Spastic dysphonia 5. SVT 6. HTN  History of Present Illness:  Yolanda Bell has done well from a cardiac standpoint.  Yolanda Bell has had a flare up of gout which has affected both feet.   Yolanda Bell has been taking indocin for the flare ups.    Yolanda Bell had a bad accident in February. Yolanda Bell was getting off an airplane and tripped on the off ramp in [place].  Yolanda Bell broke both shoulders, left thumb and had soft tissue injury to her left foot.  Yolanda Bell was treated conservatively (bilateral arm slings).   Her left thumb fracture was not found until Yolanda Bell returned to [place].   Yolanda Bell had some tachypalpitations for 22 hours during her recovery.  Her HR was 130-140 for 22 hours and resolved spontaneously.   I suspect Yolanda Bell had a brief episode of atrial flutter.  Yolanda Bell was quite emotional when Yolanda Bell was telling me the issues of her fall. It is  apparently still is quite upsetting to her.  October 19, 2012: Yolanda Bell presents today for further evaluation of atrial fib / flutter that was found at cardiac rehab.  Yolanda Bell really cannot tell that her HR is fast and irregular.   Yolanda Bell can feel her HR when Yolanda Bell is lying down.  Yolanda Bell is asymptomatic when Yolanda Bell is up and busy doing chores.  Yolanda Bell denies any chest pain or dyspnea.   Feb. 4, 2014: Yolanda Bell seems to have rapid atrial fibrillation. Yolanda Bell's on relatively high dose of metoprolol XL and also is on when necessary propranolol. He is tolerating the atrial ablation but certainly does not feel as well as Yolanda Bell would like to. Yolanda Bell's here to schedule her cardioversion next week. Yolanda Bell denies any angina. Yolanda Bell does have some shortness of breath.  January 01, 2013:  Yolanda Bell has maintained NSR  since her cardioversion ( Feb. 11, 2014) .   Yolanda Bell was quite frustrated with her recent Out patient area / endoscopy department  cardioversion.  Lots of computer issues / scheduling issues.    Yolanda Bell has done well from a cardiac standpoint.    Sept. 22, 2014:  Yolanda Bell has done well.   Yolanda Bell had some redness and tenderness just below her right eye.  Yolanda Bell is not having any cardiac problems.     January 14, 2014:  Yolanda Bell has done well.  Yolanda Bell had a brief episode of tachycardia that lasted for several days.    Sounds like an episode of SVT.  Yolanda Bell tried some propranolol and eventually resolved after about 5-6 hours.    Current Outpatient Prescriptions on File Prior to Visit  Medication Sig Dispense Refill  . aspirin 325 MG tablet Take 1 tablet (325 mg total) by mouth daily.      . Cholecalciferol (VITAMIN D-3 PO) Take 1,000 mg by mouth daily.      . indomethacin (INDOCIN) 25 MG capsule Take 25 mg by mouth 2 (two) times daily with a meal.      . metoprolol succinate (TOPROL-XL) 100 MG 24 hr tablet TAKE 1 TABLET (100 MG TOTAL) BY MOUTH DAILY.  90 tablet  3  . Misc  Natural Products (GLUCOSAMINE CHOND COMPLEX/MSM) TABS Take 1 tablet by mouth 2 (two) times daily. Triple strength      . propranolol (INDERAL) 10 MG tablet Take 1 tablet (10 mg total) by mouth 4 (four) times daily as needed (palpitations).  120 tablet  3  . spironolactone (ALDACTONE) 25 MG tablet Take 1 tablet (25 mg total) by mouth 2 (two) times daily.  180 tablet  0  . torsemide (DEMADEX) 20 MG tablet Take 1 tablet (20 mg total) by mouth daily.  90 tablet  3   No current facility-administered medications on file prior to visit.    Allergies  Allergen Reactions  . Amiodarone     Dizziness, halucinations  . Other Other (See Comments)    Cannot take STEROIDS-unknown reaction-caused by internal scarring on liver.    . Azithromycin Nausea And Vomiting and Rash    Past Medical History  Diagnosis Date  . Constrictive pericarditis     2 status post  pericardial stripping at the [facility]  . Atrial fibrillation     Status post TEE cardioversion  . Spastic dysphonia   . Supraventricular tachycardia     Status post RF ablation by Dr. Peterson Lombard  . Hypertension   . Gout     Past Surgical History  Procedure Laterality Date  . Cardioversion  11/21/2012    Procedure: CARDIOVERSION;  Surgeon: Darlin Coco, MD;  Location: [facility] ENDOSCOPY;  Service: Cardiovascular;;    History  Smoking status  . Never Smoker   Smokeless tobacco  . Not on file    History  Alcohol Use: Not on file    No family history on file.  Reviw of Systems:  Reviewed in the HPI.  All other systems are negative.  Physical Exam: Blood pressure 138/66, pulse 53, height 5\' 4"  (1.626 m), weight 186 lb 12.8 oz (84.732 kg). General: Well developed, well nourished, in no acute distress.  Yolanda Bell has spastic dysphonia. Head: Normocephalic, atraumatic, sclera non-icteric, mucus membranes are moist Neck: Supple. Negative for carotid bruits. JVD not elevated. Lungs: Clear bilaterally to auscultation without wheezes, rales, or rhonchi. Breathing is unlabored. Heart: irregularly irregular with S1 S2. No murmurs, rubs, or gallops appreciated. Abdomen: Soft, non-tender, non-distended with normoactive bowel sounds. No hepatomegaly. No rebound/guarding. No obvious abdominal masses. Msk:  Strength and tone appear normal for age. Extremities: No clubbing or cyanosis. No edema.  Distal pedal pulses are 2+ and equal bilaterally. Neuro: Alert and oriented X 3. Moves all extremities spontaneously. Psych:  Responds to questions appropriately with a normal affect.  ECG: January 14, 2014:  Sinus brady at 74. Otherwise normal ECG  Assessment / Plan:

## 2014-01-14 NOTE — Assessment & Plan Note (Signed)
Stable

## 2014-01-14 NOTE — Patient Instructions (Signed)
Your physician wants you to follow-up in: 6 months  You will receive a reminder letter in the mail two months in advance. If you don't receive a letter, please call our office to schedule the follow-up appointment.  Your physician recommends that you return for lab work in: today

## 2014-01-14 NOTE — Assessment & Plan Note (Signed)
She has not had any episodes of a-fib.  Seems to be doing well.

## 2014-01-16 ENCOUNTER — Telehealth: Payer: Self-pay | Admitting: Cardiovascular Disease

## 2014-01-16 DIAGNOSIS — E876 Hypokalemia: Secondary | ICD-10-CM

## 2014-01-16 MED ORDER — POTASSIUM CHLORIDE CRYS ER 10 MEQ PO TBCR: 10.0000 meq | EXTENDED_RELEASE_TABLET | Freq: Every day | ORAL | Status: DC

## 2014-01-16 NOTE — Telephone Encounter (Signed)
Message copied by Jonathon Jordan on Wed Jan 16, 2014  3:28 PM ------      Message from: Thayer Headings      Created: Mon Jan 14, 2014  6:00 PM       Add kdur 10 a day.  Check BMP in 3 weeks.       ------

## 2014-01-16 NOTE — Telephone Encounter (Signed)
Pt was called and will start kdur 10 meq/ 3 week lab date given.

## 2014-01-16 NOTE — Telephone Encounter (Signed)
New message ° ° ° ° ° ° ° ° ° °Pt returning nurses call °

## 2014-02-06 ENCOUNTER — Other Ambulatory Visit (INDEPENDENT_AMBULATORY_CARE_PROVIDER_SITE_OTHER): Payer: 59

## 2014-02-06 DIAGNOSIS — E876 Hypokalemia: Secondary | ICD-10-CM

## 2014-02-06 LAB — BASIC METABOLIC PANEL
BUN: 16 mg/dL (ref 6–23)
CHLORIDE: 101 meq/L (ref 96–112)
CO2: 29 mEq/L (ref 19–32)
Calcium: 9.7 mg/dL (ref 8.4–10.5)
Creatinine, Ser: 0.8 mg/dL (ref 0.4–1.2)
GFR: 74.05 mL/min (ref >60.00)
Glucose, Bld: 99 mg/dL (ref 70–99)
POTASSIUM: 4.1 meq/L (ref 3.5–5.1)
Sodium: 138 mEq/L (ref 135–145)

## 2014-02-19 ENCOUNTER — Other Ambulatory Visit: Payer: Self-pay | Admitting: Cardiovascular Disease

## 2014-05-23 ENCOUNTER — Other Ambulatory Visit: Payer: Self-pay | Admitting: Cardiovascular Disease

## 2014-06-05 ENCOUNTER — Telehealth: Payer: Self-pay | Admitting: Cardiovascular Disease

## 2014-06-05 NOTE — Telephone Encounter (Signed)
She needs to come in for ECG tomorrow or Friday.  If she is in A-Fib , she will need to be restarted on anticoagulaton.  I think she was treated with coumadin previously ( was years ago)

## 2014-06-05 NOTE — Telephone Encounter (Signed)
New message           C/o heart out of rhythm / pt has taken xarelto like dr Johnsie Cancel has instructed her to / pt would like a call back

## 2014-06-05 NOTE — Telephone Encounter (Signed)
An appointment for EKG was made for pt on Friday 8/28 at 9:00 PM. Pt is aware. Per Barnett Applebaum: Call [place] when pt arrives.

## 2014-06-05 NOTE — Telephone Encounter (Signed)
Pt states that her heart went out of rhythm yesterday. She did not feels her heart racing like it has been before; she just felt like her stomach was quizzing and her wrist was jerking, she palpated her heart beat on her wrist and found out she was out of rhythm. Her  BP was  ranging from 112/60 to 125/70, HR 135 beats/minute yesterday and 59 after started taking the Propanolol. Pt took Propanolol 10 mg at 8 PM  last night at [facility], today at   7 AM  And  11:30 AM. pt also  Started  Xarelto yesterday. Now pt's BP is 138 to 115/70, HR 102 to 107 beats/minute. Pt is taking her Metoprolol 100 mg daily this AM  as scheduled.  Pt would like to know if Dr. Acie Fredrickson would like to see her or change her medication.

## 2014-06-07 ENCOUNTER — Encounter: Payer: Self-pay | Admitting: Nurse Practitioner

## 2014-06-07 ENCOUNTER — Encounter (HOSPITAL_COMMUNITY): Payer: Self-pay | Admitting: Pharmacy Technician

## 2014-06-07 ENCOUNTER — Ambulatory Visit (INDEPENDENT_AMBULATORY_CARE_PROVIDER_SITE_OTHER): Payer: Medicare Other | Admitting: Cardiovascular Disease

## 2014-06-07 VITALS — BP 126/82 | HR 111 | Ht 64.0 in

## 2014-06-07 DIAGNOSIS — I4819 Other persistent atrial fibrillation: Secondary | ICD-10-CM

## 2014-06-07 DIAGNOSIS — E785 Hyperlipidemia, unspecified: Secondary | ICD-10-CM

## 2014-06-07 DIAGNOSIS — I4891 Unspecified atrial fibrillation: Secondary | ICD-10-CM

## 2014-06-07 LAB — BASIC METABOLIC PANEL
BUN: 20 mg/dL (ref 6–23)
CALCIUM: 9.4 mg/dL (ref 8.4–10.5)
CO2: 30 mEq/L (ref 19–32)
CREATININE: 0.9 mg/dL (ref 0.4–1.2)
Chloride: 101 mEq/L (ref 96–112)
GFR: 62.31 mL/min (ref >60.00)
GLUCOSE: 98 mg/dL (ref 70–99)
Potassium: 4 mEq/L (ref 3.5–5.1)
SODIUM: 139 meq/L (ref 135–145)

## 2014-06-07 LAB — CBC WITH DIFFERENTIAL/PLATELET
BASOS ABS: 0 10*3/uL (ref 0.0–0.1)
Basophils Relative: 0.6 % (ref 0.0–3.0)
Eosinophils Absolute: 0 10*3/uL (ref 0.0–0.7)
Eosinophils Relative: 0.1 % (ref 0.0–5.0)
HCT: 43.6 % (ref 36.0–46.0)
HEMOGLOBIN: 14.6 g/dL (ref 12.0–15.0)
LYMPHS PCT: 27.4 % (ref 12.0–46.0)
Lymphs Abs: 1.7 10*3/uL (ref 0.7–4.0)
MCHC: 33.6 g/dL (ref 30.0–36.0)
MCV: 86.8 fl (ref 78.0–100.0)
MONOS PCT: 5.9 % (ref 3.0–12.0)
Monocytes Absolute: 0.4 10*3/uL (ref 0.1–1.0)
NEUTROS ABS: 4.1 10*3/uL (ref 1.4–7.7)
NEUTROS PCT: 66 % (ref 43.0–77.0)
Platelets: 220 10*3/uL (ref 150.0–400.0)
RBC: 5.02 Mil/uL (ref 3.87–5.11)
RDW: 14.2 % (ref 11.5–15.5)
WBC: 6.2 10*3/uL (ref 4.0–10.5)

## 2014-06-07 LAB — PROTIME-INR
INR: 2 ratio — ABNORMAL HIGH (ref 0.8–1.0)
Prothrombin Time: 21.3 s — ABNORMAL HIGH (ref 9.6–13.1)

## 2014-06-07 MED ORDER — PROPRANOLOL HCL 10 MG PO TABS: 10.0000 mg | ORAL_TABLET | Freq: Four times a day (QID) | ORAL | Status: DC | PRN

## 2014-06-07 MED ORDER — RIVAROXABAN 20 MG PO TABS: 20.0000 mg | ORAL_TABLET | Freq: Every day | ORAL | Status: DC

## 2014-06-07 NOTE — Assessment & Plan Note (Signed)
She has recurrent atrial fibrillation. We'll schedule her for a TEE cardioversion on Monday. We performed a TEE cardioversion on her in the past. She understands the risks, benefits, and options. To continue with the Xarelto and propranolol / metoprolol

## 2014-06-07 NOTE — Patient Instructions (Signed)
Your physician has recommended you make the following change in your medication:  Take Xarelto 20 mg (new Rx) once daily Continue other medications as directed  Your physician recommends that you have lab work:  TODAY  Your physician has requested that you have a TEE/Cardioversion. During a TEE, sound waves are used to create images of your heart. It provides your doctor with information about the size and shape of your heart and how well your heart's chambers and valves are working. In this test, a transducer is attached to the end of a flexible tube that is guided down you throat and into your esophagus (the tube leading from your mouth to your stomach) to get a more detailed image of your heart. Once the TEE has determined that a blood clot is not present, the cardioversion begins. Electrical Cardioversion uses a jolt of electricity to your heart either through paddles or wired patches attached to your chest. This is a controlled, usually prescheduled, procedure. This procedure is done at the hospital and you are not awake during the procedure. You usually go home the day of the procedure. Please see the instruction sheet given to you today for more information.

## 2014-06-07 NOTE — Progress Notes (Signed)
Yolanda Bell Date of Birth  Feb 24, 1941 [facility] [address]    [place]   [place], [place]  [address]    [place],   [address] [phone]  Fax  [phone]  [phone]  Fax [phone]  Problems: 1.  Constrictive Pericarditis 2.  A-Flutter / Fibrillation 3.  Gout 4.  Spastic dysphonia 5. SVT 6. HTN  History of Present Illness:  She has done well from a cardiac standpoint.  She has had a flare up of gout which has affected both feet.   She has been taking indocin for the flare ups.    She had a bad accident in February. She was getting off an airplane and tripped on the off ramp in [place].  She broke both shoulders, left thumb and had soft tissue injury to her left foot.  She was treated conservatively (bilateral arm slings).   Her left thumb fracture was not found until she returned to [place].   She had some tachypalpitations for 22 hours during her recovery.  Her HR was 130-140 for 22 hours and resolved spontaneously.   I suspect she had a brief episode of atrial flutter.  She was quite emotional when she was telling me the issues of her fall. It is  apparently still is quite upsetting to her.  October 19, 2012: She presents today for further evaluation of atrial fib / flutter that was found at cardiac rehab.  She really cannot tell that her HR is fast and irregular.   She can feel her HR when she is lying down.  She is asymptomatic when she is up and busy doing chores.  She denies any chest pain or dyspnea.   Feb. 4, 2014: Yolanda Bell seems to have rapid atrial fibrillation. [name] on relatively high dose of metoprolol XL and also is on when necessary propranolol. He is tolerating the atrial ablation but certainly does not feel as well as she would like to. [name] here to schedule her cardioversion next week. She denies any angina. She does have some shortness of breath.  January 01, 2013:  She has maintained NSR  since her cardioversion ( Feb. 11, 2014) .   She was quite frustrated with her recent Out patient area / endoscopy department  cardioversion.  Lots of computer issues / scheduling issues.    She has done well from a cardiac standpoint.    Sept. 22, 2014:  She has done well.   She had some redness and tenderness just below her right eye.  She is not having any cardiac problems.     January 14, 2014:  She has done well.  She had a brief episode of tachycardia that lasted for several days.    Sounds like an episode of SVT.  She tried some propranolol and eventually resolved after about 5-6 hours.    June 07, 2014:  Yolanda Bell presents with several weeks of palpitations. She came in today and was noted to be in atrial fibrillation. She started back on her xarelto 4 days ago. Her HR is well controlled   Current Outpatient Prescriptions on File Prior to Visit  Medication Sig Dispense Refill  . Cholecalciferol (VITAMIN D-3 PO) Take 2,000 mg by mouth daily.       . indomethacin (INDOCIN) 25 MG capsule Take 25 mg by mouth 2 (two) times daily with a meal.      . Misc Natural Products (GLUCOSAMINE  CHOND COMPLEX/MSM) TABS Take 1 tablet by mouth 2 (two) times daily. Triple strength      . potassium chloride (K-DUR,KLOR-CON) 10 MEQ tablet Take 1 tablet (10 mEq total) by mouth daily.  90 tablet  3  . propranolol (INDERAL) 10 MG tablet Take 1 tablet (10 mg total) by mouth 4 (four) times daily as needed (palpitations).  120 tablet  3  . spironolactone (ALDACTONE) 25 MG tablet TAKE 1 TABLET (25 MG TOTAL) BY MOUTH 2 (TWO) TIMES DAILY.  180 tablet  0  . torsemide (DEMADEX) 20 MG tablet Take 1 tablet (20 mg total) by mouth daily.  90 tablet  3   No current facility-administered medications on file prior to visit.    Allergies  Allergen Reactions  . Amiodarone     Dizziness, halucinations  . Other Other (See Comments)    Cannot take STEROIDS-unknown reaction-caused by internal scarring on liver.    .  Azithromycin Nausea And Vomiting and Rash    Past Medical History  Diagnosis Date  . Constrictive pericarditis     2 status post pericardial stripping at the [facility]  . Atrial fibrillation     Status post TEE cardioversion  . Spastic dysphonia   . Supraventricular tachycardia     Status post RF ablation by Dr. Peterson Lombard  . Hypertension   . Gout     Past Surgical History  Procedure Laterality Date  . Cardioversion  11/21/2012    Procedure: CARDIOVERSION;  Surgeon: Darlin Coco, MD;  Location: [facility] ENDOSCOPY;  Service: Cardiovascular;;    History  Smoking status  . Never Smoker   Smokeless tobacco  . Not on file    History  Alcohol Use: Not on file    No family history on file.  Reviw of Systems:  Reviewed in the HPI.  All other systems are negative.  Physical Exam: Blood pressure 126/82, pulse 111, height 5\' 4"  (1.626 m). General: Well developed, well nourished, in no acute distress.  She has spastic dysphonia. Head: Normocephalic, atraumatic, sclera non-icteric, mucus membranes are moist Neck: Supple. Negative for carotid bruits. JVD not elevated. Lungs: Clear bilaterally to auscultation without wheezes, rales, or rhonchi. Breathing is unlabored. Heart: irregularly irregular with S1 S2. No murmurs, rubs, or gallops appreciated. Abdomen: Soft, non-tender, non-distended with normoactive bowel sounds. No hepatomegaly. No rebound/guarding. No obvious abdominal masses. Msk:  Strength and tone appear normal for age. Extremities: No clubbing or cyanosis. No edema.  Distal pedal pulses are 2+ and equal bilaterally. Neuro: Alert and oriented X 3. Moves all extremities spontaneously. Psych:  Responds to questions appropriately with a normal affect.  ECG: August 20 820 doesn't 15: Atrial fibrillation with a rate of 111. She has nonspecific ST and T wave changes.  Assessment / Plan:    She has recurrent atrial fibrillation. We'll schedule her for a TEE cardioversion  on Monday. We performed a TEE cardioversion on her in the past. She understands the risks, benefits, and options. To continue with the Xarelto and propranolol / metoprolol

## 2014-06-10 ENCOUNTER — Encounter (HOSPITAL_COMMUNITY): Payer: Medicare Other | Admitting: Anesthesiology

## 2014-06-10 ENCOUNTER — Ambulatory Visit (HOSPITAL_COMMUNITY)
Admission: RE | Admit: 2014-06-10 | Discharge: 2014-06-10 | Disposition: A | Discharge status: Home / Self Care | Payer: Medicare Other | Source: Ambulatory Visit | Attending: Cardiovascular Disease | Admitting: Cardiovascular Disease

## 2014-06-10 ENCOUNTER — Ambulatory Visit (HOSPITAL_COMMUNITY): Payer: Medicare Other | Admitting: Anesthesiology

## 2014-06-10 ENCOUNTER — Encounter (HOSPITAL_COMMUNITY): Payer: Self-pay | Admitting: *Deleted

## 2014-06-10 ENCOUNTER — Encounter (HOSPITAL_COMMUNITY)
Admission: RE | Disposition: A | Discharge status: Home / Self Care | Payer: Self-pay | Source: Ambulatory Visit | Attending: Cardiovascular Disease

## 2014-06-10 DIAGNOSIS — I080 Rheumatic disorders of both mitral and aortic valves: Secondary | ICD-10-CM | POA: Diagnosis not present

## 2014-06-10 DIAGNOSIS — I10 Essential (primary) hypertension: Secondary | ICD-10-CM | POA: Diagnosis not present

## 2014-06-10 DIAGNOSIS — J387 Other diseases of larynx: Secondary | ICD-10-CM | POA: Insufficient documentation

## 2014-06-10 DIAGNOSIS — I4891 Unspecified atrial fibrillation: Secondary | ICD-10-CM | POA: Insufficient documentation

## 2014-06-10 DIAGNOSIS — Z881 Allergy status to other antibiotic agents status: Secondary | ICD-10-CM | POA: Insufficient documentation

## 2014-06-10 DIAGNOSIS — I079 Rheumatic tricuspid valve disease, unspecified: Secondary | ICD-10-CM | POA: Diagnosis not present

## 2014-06-10 DIAGNOSIS — Z888 Allergy status to other drugs, medicaments and biological substances status: Secondary | ICD-10-CM | POA: Insufficient documentation

## 2014-06-10 DIAGNOSIS — Z79899 Other long term (current) drug therapy: Secondary | ICD-10-CM | POA: Insufficient documentation

## 2014-06-10 DIAGNOSIS — I4819 Other persistent atrial fibrillation: Secondary | ICD-10-CM

## 2014-06-10 DIAGNOSIS — M109 Gout, unspecified: Secondary | ICD-10-CM | POA: Diagnosis not present

## 2014-06-10 DIAGNOSIS — I059 Rheumatic mitral valve disease, unspecified: Secondary | ICD-10-CM

## 2014-06-10 DIAGNOSIS — I311 Chronic constrictive pericarditis: Secondary | ICD-10-CM | POA: Diagnosis not present

## 2014-06-10 DIAGNOSIS — I498 Other specified cardiac arrhythmias: Secondary | ICD-10-CM | POA: Insufficient documentation

## 2014-06-10 DIAGNOSIS — I4892 Unspecified atrial flutter: Secondary | ICD-10-CM | POA: Diagnosis not present

## 2014-06-10 HISTORY — PX: TEE WITHOUT CARDIOVERSION: SHX5443

## 2014-06-10 HISTORY — PX: CARDIOVERSION: SHX1299

## 2014-06-10 SURGERY — ECHOCARDIOGRAM, TRANSESOPHAGEAL
Anesthesia: Monitor Anesthesia Care

## 2014-06-10 MED ORDER — PROPOFOL INFUSION 10 MG/ML OPTIME
INTRAVENOUS | Status: DC | PRN
Start: 2014-06-10 — End: 2014-06-10
  Administered 2014-06-10: 75 ug/kg/min via INTRAVENOUS

## 2014-06-10 MED ORDER — LIDOCAINE HCL (CARDIAC) 20 MG/ML IV SOLN
INTRAVENOUS | Status: DC | PRN
  Administered 2014-06-10: 40 mg via INTRAVENOUS

## 2014-06-10 MED ORDER — SODIUM CHLORIDE 0.9 % IV SOLN
INTRAVENOUS | Status: DC
  Administered 2014-06-10: 10:00:00 via INTRAVENOUS

## 2014-06-10 NOTE — CV Procedure (Signed)
    Transesophageal Echocardiogram Note  Yolanda Bell [id number] [birth year]  Procedure: Transesophageal Echocardiogram Indications: atrial fib  Procedure Details Consent: Obtained Time Out: Verified patient identification, verified procedure, site/side was marked, verified correct patient position, special equipment/implants available, Radiology Safety Procedures followed,  medications/allergies/relevent history reviewed, required imaging and test results available.  Performed  Medications: Fentanyl:  Versed:  Propofol 200 iv drip for TEE and Cardioversion together Lidocaine 40 mg iv   Left Ventrical:  Normal LV function  Mitral Valve: moderate MR  Aortic Valve: normal  Tricuspid Valve: mild TR  Pulmonic Valve: normal  Left Atrium/ Left atrial appendage: normla  Atrial septum: normal  Aorta: mild calicifcation   Complications: No apparent complications Patient did tolerate procedure well.    Cardioversion Note   Procedure: DC Cardioversion Indications: atrial fib  Procedure Details Consent: Obtained Time Out: Verified patient identification, verified procedure, site/side was marked, verified correct patient position, special equipment/implants available, Radiology Safety Procedures followed,  medications/allergies/relevent history reviewed, required imaging and test results available.  Performed  The patient has been on adequate anticoagulation.  Synchronous cardioversion was performed at 120  Joules.  I was assisted by Lucy Antigua, PA student  The cardioversion was successful    Complications: No apparent complications Patient did tolerate procedure well.   Thayer Headings, Brooke Bonito., MD, [facility] 06/10/2014, 10:48 AM

## 2014-06-10 NOTE — H&P (Signed)
Yolanda Bell Date of Birth              08-21-1941 [place] [address]    [place]                         [place], [place]  [address]                                           [place], [place]  [address] [phone]              Fax  [phone]                 [phone]  Fax [phone]   Problems: 1.  Constrictive Pericarditis 2.  A-Flutter / Fibrillation 3.  Gout 4.  Spastic dysphonia 5. SVT 6. HTN   History of Present Illness:   She has done well from a cardiac standpoint.  She has had a flare up of gout which has affected both feet.   She has been taking indocin for the flare ups.     She had a bad accident in February. She was getting off an airplane and tripped on the off ramp in [place].  She broke both shoulders, left thumb and had soft tissue injury to her left foot.  She was treated conservatively (bilateral arm slings).   Her left thumb fracture was not found until she returned to [place].   She had some tachypalpitations for 22 hours during her recovery.  Her HR was 130-140 for 22 hours and resolved spontaneously.   I suspect she had a brief episode of atrial flutter.   She was quite emotional when she was telling me the issues of her fall. It is  apparently still is quite upsetting to her.   October 19, 2012: She presents today for further evaluation of atrial fib / flutter that was found at cardiac rehab.  She really cannot tell that her HR is fast and irregular.   She can feel her HR when she is lying down.  She is asymptomatic when she is up and busy doing chores.  She denies any chest pain or dyspnea.    Feb. 4, 2014: Yolanda Bell seems to have rapid atrial fibrillation. [name] on relatively high dose of metoprolol XL and also is on when necessary propranolol. He is tolerating the atrial ablation but certainly does not feel as well as she would  like to. [name] here to schedule her cardioversion next week. She denies any angina. She does have some shortness of breath.   January 01, 2013:   She has maintained NSR since her cardioversion ( Feb. 11, 2014) .   She was quite frustrated with her recent Out patient area / endoscopy department  cardioversion.  Lots of computer issues / scheduling issues.     She has done well from a cardiac standpoint.     Sept. 22, 2014:   She has done well.   She had some redness and  tenderness just below her right eye.  She is not having any cardiac problems.      January 14, 2014:  She has done well.  She had a brief episode of tachycardia that lasted for several days.    Sounds like an episode of SVT.  She tried some propranolol and eventually resolved after about 5-6 hours.     June 07, 2014:   Yolanda Bell presents with several weeks of palpitations. She came in today and was noted to be in atrial fibrillation. She started back on her xarelto 4 days ago. Her HR is well controlled      Current Outpatient Prescriptions on File Prior to Visit   Medication  Sig  Dispense  Refill   .  Cholecalciferol (VITAMIN D-3 PO)  Take 2,000 mg by mouth daily.          .  indomethacin (INDOCIN) 25 MG capsule  Take 25 mg by mouth 2 (two) times daily with a meal.         .  Misc Natural Products (GLUCOSAMINE CHOND COMPLEX/MSM) TABS  Take 1 tablet by mouth 2 (two) times daily. Triple strength         .  potassium chloride (K-DUR,KLOR-CON) 10 MEQ tablet  Take 1 tablet (10 mEq total) by mouth daily.   90 tablet   3   .  propranolol (INDERAL) 10 MG tablet  Take 1 tablet (10 mg total) by mouth 4 (four) times daily as needed (palpitations).   120 tablet   3   .  spironolactone (ALDACTONE) 25 MG tablet  TAKE 1 TABLET (25 MG TOTAL) BY MOUTH 2 (TWO) TIMES DAILY.   180 tablet   0   .  torsemide (DEMADEX) 20 MG tablet  Take 1 tablet (20 mg total) by mouth daily.   90 tablet   3       No current facility-administered  medications on file prior to visit.         Allergies   Allergen  Reactions   .  Amiodarone         Dizziness, halucinations   .  Other  Other (See Comments)       Cannot take STEROIDS-unknown reaction-caused by internal scarring on liver.     .  Azithromycin  Nausea And Vomiting and Rash         Past Medical History   Diagnosis  Date   .  Constrictive pericarditis         2 status post pericardial stripping at the [facility]   .  Atrial fibrillation         Status post TEE cardioversion   .  Spastic dysphonia     .  Supraventricular tachycardia         Status post RF ablation by Dr. Peterson Lombard   .  Hypertension     .  Gout           Past Surgical History   Procedure  Laterality  Date   .  Cardioversion    11/21/2012       Procedure: CARDIOVERSION;  Surgeon: Darlin Coco, MD;  Location: [facility] ENDOSCOPY;  Service: Cardiovascular;;         History   Smoking status   .  Never Smoker    Smokeless tobacco   .  Not on file         History   Alcohol Use:  Not on file  No family history on file.   Reviw of Systems:   Reviewed in the HPI.  All other systems are negative.   Physical Exam: Blood pressure 126/82, pulse 111, height 5\' 4"  (1.626 m). General: Well developed, well nourished, in no acute distress.  She has spastic dysphonia. Head: Normocephalic, atraumatic, sclera non-icteric, mucus membranes are moist Neck: Supple. Negative for carotid bruits. JVD not elevated. Lungs: Clear bilaterally to auscultation without wheezes, rales, or rhonchi. Breathing is unlabored. Heart: irregularly irregular with S1 S2. No murmurs, rubs, or gallops appreciated. Abdomen: Soft, non-tender, non-distended with normoactive bowel sounds. No hepatomegaly. No rebound/guarding. No obvious abdominal masses. Msk:  Strength and tone appear normal for age. Extremities: No clubbing or cyanosis. No edema.  Distal pedal pulses are 2+ and equal bilaterally. Neuro: Alert and  oriented X 3. Moves all extremities spontaneously. Psych:  Responds to questions appropriately with a normal affect.   ECG: August 20 820 doesn't 15: Atrial fibrillation with a rate of 111. She has nonspecific ST and T wave changes.   Assessment / Plan:      She has recurrent atrial fibrillation. We'll schedule her for a TEE cardioversion on Monday. We performed a TEE cardioversion on her in the past. She understands the risks, benefits, and options. To continue with the Xarelto and propranolol / metoprolol          Atrial fibrillation    She has recurrent atrial fibrillation. We'll schedule her for a TEE cardioversion on today We performed a TEE cardioversion on her in the past. She understands the risks, benefits, and options. To continue with the Xarelto and propranolol / metoprolol

## 2014-06-10 NOTE — Progress Notes (Signed)
Echocardiogram Echocardiogram Transesophageal has been performed.  Joelene Millin 06/10/2014, 10:59 AM

## 2014-06-10 NOTE — Anesthesia Procedure Notes (Signed)
Procedure Name: MAC Date/Time: 06/10/2014 10:30 AM Performed by: Julian Reil Pre-anesthesia Checklist: Patient identified, Emergency Drugs available, Suction available and Patient being monitored Patient Re-evaluated:Patient Re-evaluated prior to inductionOxygen Delivery Method: Nasal cannula Intubation Type: IV induction Placement Confirmation: positive ETCO2 and breath sounds checked- equal and bilateral

## 2014-06-10 NOTE — Discharge Instructions (Signed)
Transesophageal Echocardiogram °Transesophageal echocardiography (TEE) is a picture test of your heart using sound waves. The pictures taken can give very detailed pictures of your heart. This can help your doctor see if there are problems with your heart. TEE can check: °· If your heart has blood clots in it. °· How well your heart valves are working. °· If you have an infection on the inside of your heart. °· Some of the major arteries of your heart. °· If your heart valve is working after a repair. °· Your heart before a procedure that uses a shock to your heart to get the rhythm back to normal. °BEFORE THE PROCEDURE °· Do not eat or drink for 6 hours before the procedure or as told by your doctor. °· Make plans to have someone drive you home after the procedure. Do not drive yourself home. °· An IV tube will be put in your arm. °PROCEDURE °· You will be given a medicine to help you relax (sedative). It will be given through the IV tube. °· A numbing medicine will be sprayed or gargled in the back of your throat to help numb it. °· The tip of the probe is placed into the back of your mouth. You will be asked to swallow. This helps to pass the probe into your esophagus. °· Once the tip of the probe is in the right place, your doctor can take pictures of your heart. °· You may feel pressure at the back of your throat. °AFTER THE PROCEDURE °· You will be taken to a recovery area so the sedative can wear off. °· Your throat may be sore and scratchy. This will go away slowly over time. °· You will go home when you are fully awake and able to swallow liquids. °· You should have someone stay with you for the next 24 hours. °· Do not drive or operate machinery for the next 24 hours. °Document Released: 07/25/2009 Document Revised: 10/02/2013 Document Reviewed: 03/29/2013 °ExitCare® Patient Information ©2015 [employer]. This information is not intended to replace advice given to you by your health care provider. Make  sure you discuss any questions you have with your health care provider. ° °Electrical Cardioversion, Care After °Refer to this sheet in the next few weeks. These instructions provide you with information on caring for yourself after your procedure. Your health care provider may also give you more specific instructions. Your treatment has been planned according to current medical practices, but problems sometimes occur. Call your health care provider if you have any problems or questions after your procedure. °WHAT TO EXPECT AFTER THE PROCEDURE °After your procedure, it is typical to have the following sensations: °· Some redness on the skin where the shocks were delivered. If this is tender, a sunburn lotion or hydrocortisone cream may help. °· Possible return of an abnormal heart rhythm within hours or days after the procedure. °HOME CARE INSTRUCTIONS °· Take medicines only as directed by your health care provider. Be sure you understand how and when to take your medicine. °· Learn how to feel your pulse and check it often. °· Limit your activity for 48 hours after the procedure or as directed by your health care provider. °· Avoid or minimize caffeine and other stimulants as directed by your health care provider. °SEEK MEDICAL CARE IF: °· You feel like your heart is beating too fast or your pulse is not regular. °· You have any questions about your medicines. °· You have bleeding that   will not stop. °SEEK IMMEDIATE MEDICAL CARE IF: °· You are dizzy or feel faint. °· It is hard to breathe or you feel short of breath. °· There is a change in discomfort in your chest. °· Your speech is slurred or you have trouble moving an arm or leg on one side of your body. °· You get a serious muscle cramp that does not go away. °· Your fingers or toes turn cold or blue. °Document Released: 07/18/2013 Document Revised: 02/11/2014 Document Reviewed: 07/18/2013 °ExitCare® Patient Information ©2015 [employer]. This information is  not intended to replace advice given to you by your health care provider. Make sure you discuss any questions you have with your health care provider. ° °

## 2014-06-10 NOTE — Transfer of Care (Signed)
Immediate Anesthesia Transfer of Care Note  Patient: Yolanda Bell  Procedure(s) Performed: Procedure(s): TRANSESOPHAGEAL ECHOCARDIOGRAM (TEE) (N/A) CARDIOVERSION (N/A)  Patient Location: Endoscopy Unit  Anesthesia Type:MAC  Level of Consciousness: awake, alert , oriented and patient cooperative  Airway & Oxygen Therapy: Patient Spontanous Breathing and Patient connected to nasal cannula oxygen  Post-op Assessment: Report given to PACU RN, Post -op Vital signs reviewed and stable and Patient moving all extremities  Post vital signs: Reviewed and stable  Complications: No apparent anesthesia complications

## 2014-06-10 NOTE — Anesthesia Preprocedure Evaluation (Signed)
Anesthesia Evaluation  Patient identified by MRN, date of birth, ID band Patient awake    Reviewed: Allergy & Precautions, H&P , NPO status , Patient's Chart, lab work & pertinent test results  Airway Mallampati: III TM Distance: >3 FB Neck ROM: Full    Dental  (+) Teeth Intact,    Pulmonary neg pulmonary ROS,  breath sounds clear to auscultation        Cardiovascular hypertension, Pt. on medications and Pt. on home beta blockers - angina+CHF - CAD, - Past MI and - CABG + dysrhythmias Atrial Fibrillation Rhythm:Irregular Rate:Normal  Previous pericardial stripping for restrictive pericardium   Neuro/Psych negative neurological ROS  negative psych ROS   GI/Hepatic negative GI ROS, Liver dysfunction, denies varices    Endo/Other  Morbid obesity  Renal/GU negative Renal ROS     Musculoskeletal   Abdominal   Peds  Hematology xarelto for afib   Anesthesia Other Findings spasmodic dysphonia with intermittent vocal cord dysfunction, denies esophageal concerns  Reproductive/Obstetrics                           Anesthesia Physical Anesthesia Plan  ASA: III  Anesthesia Plan: MAC   Post-op Pain Management:    Induction: Intravenous  Airway Management Planned: Natural Airway and Simple Face Mask  Additional Equipment: None  Intra-op Plan:   Post-operative Plan:   Informed Consent: I have reviewed the patients History and Physical, chart, labs and discussed the procedure including the risks, benefits and alternatives for the proposed anesthesia with the patient or authorized representative who has indicated his/her understanding and acceptance.   Dental advisory given  Plan Discussed with: CRNA and Surgeon  Anesthesia Plan Comments:         Anesthesia Quick Evaluation

## 2014-06-10 NOTE — Anesthesia Postprocedure Evaluation (Signed)
  Anesthesia Post-op Note  Patient: Yolanda Bell  Procedure(s) Performed: Procedure(s): TRANSESOPHAGEAL ECHOCARDIOGRAM (TEE) (N/A) CARDIOVERSION (N/A)  Patient Location: Endoscopy Unit  Anesthesia Type:MAC  Level of Consciousness: awake, alert , oriented and patient cooperative  Airway and Oxygen Therapy: Patient Spontanous Breathing and Patient connected to nasal cannula oxygen  Post-op Pain: none  Post-op Assessment: Post-op Vital signs reviewed, Patient's Cardiovascular Status Stable, Respiratory Function Stable, Patent Airway and No signs of Nausea or vomiting  Post-op Vital Signs: Reviewed and stable  Last Vitals:  Filed Vitals:   06/10/14 0949  BP: 138/80  Pulse: 116  Temp: 36.6 C  Resp: 23    Complications: No apparent anesthesia complications

## 2014-06-11 ENCOUNTER — Encounter (HOSPITAL_COMMUNITY): Payer: Self-pay | Admitting: Cardiovascular Disease

## 2014-07-15 ENCOUNTER — Encounter: Payer: Self-pay | Admitting: Cardiovascular Disease

## 2014-07-15 ENCOUNTER — Ambulatory Visit (INDEPENDENT_AMBULATORY_CARE_PROVIDER_SITE_OTHER): Payer: Medicare Other | Admitting: Cardiovascular Disease

## 2014-07-15 VITALS — BP 144/80 | HR 63 | Ht 64.0 in | Wt 180.1 lb

## 2014-07-15 DIAGNOSIS — I10 Essential (primary) hypertension: Secondary | ICD-10-CM

## 2014-07-15 DIAGNOSIS — I311 Chronic constrictive pericarditis: Secondary | ICD-10-CM

## 2014-07-15 DIAGNOSIS — I480 Paroxysmal atrial fibrillation: Secondary | ICD-10-CM

## 2014-07-15 MED ORDER — TORSEMIDE 20 MG PO TABS: 20.0000 mg | ORAL_TABLET | Freq: Every day | ORAL | Status: DC

## 2014-07-15 MED ORDER — METOPROLOL SUCCINATE ER 50 MG PO TB24: 50.0000 mg | ORAL_TABLET | Freq: Every day | ORAL | Status: DC

## 2014-07-15 NOTE — Assessment & Plan Note (Signed)
Yolanda Bell seems to be doing well. She is maintaining normal sinus rhythm. Continue same medications.

## 2014-07-15 NOTE — Patient Instructions (Signed)
Your physician recommends that you continue on your current medications as directed. Please refer to the Current Medication list given to you today.  Your physician wants you to follow-up in: 6 months with Dr. Nahser.  You will receive a reminder letter in the mail two months in advance. If you don't receive a letter, please call our office to schedule the follow-up appointment.  

## 2014-07-15 NOTE — Progress Notes (Signed)
Yolanda Bell Date of Birth  [birth year] [place] [address]    [place]   [place], [place]  [address]    [place], [place]  [address] [phone]  Fax  [phone]  [phone]  Fax [phone]  Problems: 1.  Constrictive Pericarditis 2.  A-Flutter / Fibrillation 3.  Gout 4.  Spastic dysphonia 5. SVT 6. HTN  History of Present Illness:  She has done well from a cardiac standpoint.  She has had a flare up of gout which has affected both feet.   She has been taking indocin for the flare ups.    She had a bad accident in February. She was getting off an airplane and tripped on the off ramp in [place].  She broke both shoulders, left thumb and had soft tissue injury to her left foot.  She was treated conservatively (bilateral arm slings).   Her left thumb fracture was not found until she returned to [place].   She had some tachypalpitations for 22 hours during her recovery.  Her HR was 130-140 for 22 hours and resolved spontaneously.   I suspect she had a brief episode of atrial flutter.  She was quite emotional when she was telling me the issues of her fall. It is  apparently still is quite upsetting to her.  October 19, 2012: She presents today for further evaluation of atrial fib / flutter that was found at cardiac rehab.  She really cannot tell that her HR is fast and irregular.   She can feel her HR when she is lying down.  She is asymptomatic when she is up and busy doing chores.  She denies any chest pain or dyspnea.   Feb. 4, 2014: Yolanda Bell seems to have rapid atrial fibrillation. [name] on relatively high dose of metoprolol XL and also is on when necessary propranolol. He is tolerating the atrial ablation but certainly does not feel as well as she would like to. [name] here to schedule her cardioversion next week. She denies any angina. She does have some shortness of breath.  January 01, 2013:  She has maintained NSR  since her cardioversion ( Feb. 11, 2014) .   She was quite frustrated with her recent Out patient area / endoscopy department  cardioversion.  Lots of computer issues / scheduling issues.    She has done well from a cardiac standpoint.    Sept. 22, 2014:  She has done well.   She had some redness and tenderness just below her right eye.  She is not having any cardiac problems.     January 14, 2014:  She has done well.  She had a brief episode of tachycardia that lasted for several days.    Sounds like an episode of SVT.  She tried some propranolol and eventually resolved after about 5-6 hours.    June 07, 2014:  Yolanda Bell presents with several weeks of palpitations. She came in today and was noted to be in atrial fibrillation. She started back on her xarelto 4 days ago. Her HR is well controlled  Oct. 5, 2015:  Yolanda Bell had a cardioversion August 31.  She had a brief episode of irregular Hr  - she took some propraoolol.  .  She thinks that she eventually converted back to NSR.    In looking back , these were probably PVCs.    Current Outpatient Prescriptions on File Prior to Visit  Medication Sig Dispense Refill  . Cholecalciferol (VITAMIN D-3 PO) Take 2,000 mg by mouth daily.       . indomethacin (INDOCIN) 25 MG capsule Take 25 mg by mouth 2 (two) times daily as needed.       . metoprolol succinate (TOPROL-XL) 100 MG 24 hr tablet Take 50 mg by mouth daily.       . Misc Natural Products (GLUCOSAMINE CHOND COMPLEX/MSM) TABS Take 1 tablet by mouth 2 (two) times daily. Triple strength      . potassium chloride (K-DUR,KLOR-CON) 10 MEQ tablet Take 1 tablet (10 mEq total) by mouth daily.  90 tablet  3  . rivaroxaban (XARELTO) 20 MG TABS tablet Take 1 tablet (20 mg total) by mouth daily with supper.  30 tablet  11  . spironolactone (ALDACTONE) 25 MG tablet Take 25 mg by mouth 2 (two) times daily.      Marland Kitchen torsemide (DEMADEX) 20 MG tablet Take 1 tablet (20 mg total) by mouth daily.  90 tablet  3   No  current facility-administered medications on file prior to visit.    Allergies  Allergen Reactions  . Amiodarone     Dizziness, halucinations  . Other Other (See Comments)    Cannot take STEROIDS-unknown reaction-caused by internal scarring on liver.    . Azithromycin Nausea And Vomiting and Rash    Past Medical History  Diagnosis Date  . Constrictive pericarditis     2 status post pericardial stripping at the [facility]  . Atrial fibrillation     Status post TEE cardioversion  . Spastic dysphonia   . Supraventricular tachycardia     Status post RF ablation by Dr. Peterson Lombard  . Hypertension   . Gout     Past Surgical History  Procedure Laterality Date  . Cardioversion  11/21/2012    Procedure: CARDIOVERSION;  Surgeon: Darlin Coco, MD;  Location: [facility] ENDOSCOPY;  Service: Cardiovascular;;  . Pericardiectomy    . Cholecystectomy      1989  . Abdominal hysterectomy      1987  . Tonsillectomy    . Eye surgery      cataracts  . Tee without cardioversion N/A 06/10/2014    Procedure: TRANSESOPHAGEAL ECHOCARDIOGRAM (TEE);  Surgeon: Thayer Headings, MD;  Location: [place];  Service: Cardiovascular;  Laterality: N/A;  . Cardioversion N/A 06/10/2014    Procedure: CARDIOVERSION;  Surgeon: Thayer Headings, MD;  Location: [facility] ENDOSCOPY;  Service: Cardiovascular;  Laterality: N/A;    History  Smoking status  . Never Smoker   Smokeless tobacco  . Never Used    History  Alcohol Use  . Yes    Comment: occasionally    No family history on file.  Reviw of Systems:  Reviewed in the HPI.  All other systems are negative.  Physical Exam: Blood pressure 144/80, pulse 63, height 5\' 4"  (1.626 m), weight 180 lb 1.9 oz (81.702 kg). General: Well developed, well nourished, in no acute distress.  She has spastic dysphonia. Head: Normocephalic, atraumatic, sclera non-icteric, mucus membranes are moist Neck: Supple. Negative for carotid bruits. JVD not elevated. Lungs: Clear  bilaterally to auscultation without wheezes, rales, or rhonchi. Breathing is unlabored. Heart: irregularly irregular with S1 S2. No murmurs, rubs, or gallops appreciated. Abdomen: Soft, non-tender, non-distended with normoactive bowel sounds. No hepatomegaly. No rebound/guarding. No obvious abdominal masses. Msk:  Strength and tone appear normal for age. Extremities: No clubbing or cyanosis. No edema.  Distal pedal pulses are 2+ and  equal bilaterally. Neuro: Alert and oriented X 3. Moves all extremities spontaneously. Psych:  Responds to questions appropriately with a normal affect.  ECG:   Assessment / Plan:    She has recurrent atrial fibrillation. We'll schedule her for a TEE cardioversion on Monday. We performed a TEE cardioversion on her in the past. She understands the risks, benefits, and options. To continue with the Xarelto and propranolol / metoprolol

## 2014-08-25 ENCOUNTER — Other Ambulatory Visit: Payer: Self-pay | Admitting: Cardiovascular Disease

## 2015-01-06 ENCOUNTER — Other Ambulatory Visit: Payer: Self-pay | Admitting: Cardiovascular Disease

## 2015-01-31 ENCOUNTER — Ambulatory Visit: Payer: Self-pay | Admitting: Cardiovascular Disease

## 2015-02-18 ENCOUNTER — Ambulatory Visit (INDEPENDENT_AMBULATORY_CARE_PROVIDER_SITE_OTHER): Payer: Medicare Other | Admitting: Cardiovascular Disease

## 2015-02-18 ENCOUNTER — Encounter: Payer: Self-pay | Admitting: Cardiovascular Disease

## 2015-02-18 VITALS — BP 136/80 | HR 75 | Ht 64.0 in | Wt 180.8 lb

## 2015-02-18 DIAGNOSIS — I480 Paroxysmal atrial fibrillation: Secondary | ICD-10-CM

## 2015-02-18 DIAGNOSIS — I10 Essential (primary) hypertension: Secondary | ICD-10-CM

## 2015-02-18 NOTE — Patient Instructions (Signed)
Medication Instructions:  Your physician recommends that you continue on your current medications as directed. Please refer to the Current Medication list given to you today.   Labwork: None Ordered   Testing/Procedures: None Ordered   Follow-Up: Your physician wants you to follow-up in: 6 months with Dr. Nahser.  You will receive a reminder letter in the mail two months in advance. If you don't receive a letter, please call our office to schedule the follow-up appointment.     

## 2015-02-18 NOTE — Progress Notes (Signed)
Cardiology Office Note   Date:  02/18/2015   ID:  Yolanda Bell December 15, 1940, MRN [id number]  PCP:  Gennette Pac, MD  Cardiologist:   Thayer Headings, MD   Chief Complaint  Patient presents with  . Chemical Exposure    atrial flutter   1. Constrictive Pericarditis 2. A-Flutter / Fibrillation 3. Gout 4. Spastic dysphonia 5. SVT 6. HTN  History of Present Illness:  She has done well from a cardiac standpoint. She has had a flare up of gout which has affected both feet. She has been taking indocin for the flare ups.   She had a bad accident in February. She was getting off an airplane and tripped on the off ramp in [place]. She broke both shoulders, left thumb and had soft tissue injury to her left foot. She was treated conservatively (bilateral arm slings). Her left thumb fracture was not found until she returned to [place]. She had some tachypalpitations for 22 hours during her recovery. Her HR was 130-140 for 22 hours and resolved spontaneously. I suspect she had a brief episode of atrial flutter.  She was quite emotional when she was telling me the issues of her fall. It is apparently still is quite upsetting to her.  October 19, 2012: She presents today for further evaluation of atrial fib / flutter that was found at cardiac rehab. She really cannot tell that her HR is fast and irregular. She can feel her HR when she is lying down. She is asymptomatic when she is up and busy doing chores. She denies any chest pain or dyspnea.   Feb. 4, 2014: Yolanda Bell seems to have rapid atrial fibrillation. [name] on relatively high dose of metoprolol XL and also is on when necessary propranolol. He is tolerating the atrial ablation but certainly does not feel as well as she would like to. [name] here to schedule her cardioversion next week. She denies any angina. She does have some shortness of breath.  January 01, 2013:  She has maintained NSR since her  cardioversion ( Feb. 11, 2014) . She was quite frustrated with her recent Out patient area / endoscopy department cardioversion. Lots of computer issues / scheduling issues.   She has done well from a cardiac standpoint.   Sept. 22, 2014:  She has done well. She had some redness and tenderness just below her right eye. She is not having any cardiac problems.   January 14, 2014: She has done well. She had a brief episode of tachycardia that lasted for several days. Sounds like an episode of SVT. She tried some propranolol and eventually resolved after about 5-6 hours.   June 07, 2014:  Yolanda Bell presents with several weeks of palpitations. She came in today and was noted to be in atrial fibrillation. She started back on her xarelto 4 days ago. Her HR is well controlled  Oct. 5, 2015:  Yolanda Bell had a cardioversion August 31. She had a brief episode of irregular Hr - she took some propraoolol. . She thinks that she eventually converted back to NSR. In looking back , these were probably PVCs.   May , 10 , 2016;   Yolanda Bell is a 74 y.o. female who presents for follow up of her atrial fib She was cardioverted last august.   She was found to have a few RBCs in a UA - no obvious blood   Past Medical History  Diagnosis Date  . Constrictive pericarditis  2 status post pericardial stripping at the [facility]  . Atrial fibrillation     Status post TEE cardioversion  . Spastic dysphonia   . Supraventricular tachycardia     Status post RF ablation by Dr. Peterson Lombard  . Hypertension   . Gout     Past Surgical History  Procedure Laterality Date  . Cardioversion  11/21/2012    Procedure: CARDIOVERSION;  Surgeon: Darlin Coco, MD;  Location: [facility] ENDOSCOPY;  Service: Cardiovascular;;  . Pericardiectomy    . Cholecystectomy      1989  . Abdominal hysterectomy      1987  . Tonsillectomy    . Eye surgery      cataracts  . Tee without cardioversion N/A 06/10/2014     Procedure: TRANSESOPHAGEAL ECHOCARDIOGRAM (TEE);  Surgeon: Thayer Headings, MD;  Location: [facility];  Service: Cardiovascular;  Laterality: N/A;  . Cardioversion N/A 06/10/2014    Procedure: CARDIOVERSION;  Surgeon: Thayer Headings, MD;  Location: [facility] ENDOSCOPY;  Service: Cardiovascular;  Laterality: N/A;     Current Outpatient Prescriptions  Medication Sig Dispense Refill  . alendronate (FOSAMAX) 70 MG tablet Take 70 mg by mouth once a week.  4  . Cholecalciferol (VITAMIN D-3 PO) Take 2,000 mg by mouth daily.     . indomethacin (INDOCIN) 25 MG capsule Take 25 mg by mouth 2 (two) times daily as needed.     Marland Kitchen KLOR-CON M10 10 MEQ tablet TAKE 1 TABLET (10 MEQ TOTAL) BY MOUTH DAILY. 90 tablet 0  . metoprolol succinate (TOPROL-XL) 50 MG 24 hr tablet Take 1 tablet (50 mg total) by mouth daily. 90 tablet 3  . Misc Natural Products (GLUCOSAMINE CHOND COMPLEX/MSM) TABS Take 1 tablet by mouth 2 (two) times daily. Triple strength    . propranolol (INDERAL) 10 MG tablet Take 10 mg by mouth 4 (four) times daily as needed.    . rivaroxaban (XARELTO) 20 MG TABS tablet Take 1 tablet (20 mg total) by mouth daily with supper. 30 tablet 11  . spironolactone (ALDACTONE) 50 MG tablet Take 50 mg by mouth daily.    Marland Kitchen torsemide (DEMADEX) 20 MG tablet Take 1 tablet (20 mg total) by mouth daily. 90 tablet 3   No current facility-administered medications for this visit.    Allergies:   Amiodarone; Other; and Azithromycin    Social History:  The patient  reports that she has never smoked. She has never used smokeless tobacco. She reports that she drinks alcohol. She reports that she does not use illicit drugs.   Family History:  The patient's family history includes Breast cancer in her sister; Emphysema in her father; Heart disease in her mother.    ROS:  Please see the history of present illness.    Review of Systems: Constitutional:  denies fever, chills, diaphoresis, appetite change and fatigue.    HEENT: denies photophobia, eye pain, redness, hearing loss, ear pain, congestion, sore throat, rhinorrhea, sneezing, neck pain, neck stiffness and tinnitus.  Respiratory: denies SOB, DOE, cough, chest tightness, and wheezing.  Cardiovascular: denies chest pain, palpitations and leg swelling.  Gastrointestinal: denies nausea, vomiting, abdominal pain, diarrhea, constipation, blood in stool.  Genitourinary: denies dysuria, urgency, frequency, hematuria, flank pain and difficulty urinating.  Musculoskeletal: denies  myalgias, back pain, joint swelling, arthralgias and gait problem.   Skin: denies pallor, rash and wound.  Neurological: denies dizziness, seizures, syncope, weakness, light-headedness, numbness and headaches.   Hematological: denies adenopathy, easy bruising, personal or family bleeding history.  Psychiatric/ Behavioral: denies suicidal ideation, mood changes, confusion, nervousness, sleep disturbance and agitation.       All other systems are reviewed and negative.    PHYSICAL EXAM: VS:  BP 136/80 mmHg  Pulse 75  Ht 5\' 4"  (1.626 m)  Wt 82.01 kg (180 lb 12.8 oz)  BMI 31.02 kg/m2 , BMI Body mass index is 31.02 kg/(m^2). GEN: Well nourished, well developed, in no acute distress HEENT: normal Neck: no JVD, carotid bruits, or masses Cardiac: RRR; no murmurs, rubs, or gallops,no edema  Respiratory:  clear to auscultation bilaterally, normal work of breathing GI: soft, nontender, nondistended, + BS MS: no deformity or atrophy Skin: warm and dry, no rash Neuro:  Strength and sensation are intact Psych: normal   EKG:  EKG is ordered today. The ekg ordered today demonstrates   NSR at 75. NS ST abn.    Recent Labs: 06/07/2014: BUN 20; Creatinine 0.9; Hemoglobin 14.6; Platelets 220.0; Potassium 4.0; Sodium 139    Lipid Panel    Component Value Date/Time   CHOL 169 01/14/2014 1003   TRIG 129.0 01/14/2014 1003   HDL 43.20 01/14/2014 1003   CHOLHDL 4 01/14/2014 1003    VLDL 25.8 01/14/2014 1003   LDLCALC 100* 01/14/2014 1003      Wt Readings from Last 3 Encounters:  02/18/15 82.01 kg (180 lb 12.8 oz)  07/15/14 81.702 kg (180 lb 1.9 oz)  06/10/14 80.74 kg (178 lb)      Other studies Reviewed: Additional studies/ records that were reviewed today include: . Review of the above records demonstrates:    ASSESSMENT AND PLAN:  1. Constrictive Pericarditis - status post pericardectomy. 2. A-Flutter / Fibrillation - continue current dose of Xarelto. She has maintained normal sinus rhythm. 3. Gout 4. Spastic dysphonia 5. SVT 6. HTN - blood pressures well-controlled.   Current medicines are reviewed at length with the patient today.  The patient does not have concerns regarding medicines.  The following changes have been made:  no change  Labs/ tests ordered today include:  No orders of the defined types were placed in this encounter.     Disposition:   FU with me in 6 months      Ashanti Ratti, Wonda Cheng, MD  02/18/2015 4:23 PM    [facility] [place], [place], [place]  [address] Phone: [phone]; Fax: [phone]   [facility]  [address] [place], [place]  [address] [phone]    Fax [phone]

## 2015-04-03 ENCOUNTER — Other Ambulatory Visit: Payer: Self-pay | Admitting: Cardiovascular Disease

## 2015-04-14 ENCOUNTER — Other Ambulatory Visit: Payer: Self-pay | Admitting: Cardiovascular Disease

## 2015-05-21 ENCOUNTER — Other Ambulatory Visit: Payer: Self-pay | Admitting: Cardiovascular Disease

## 2015-05-21 NOTE — Telephone Encounter (Signed)
Just wanted to clarify. Should this now be a 50mg  tablet once daily? Please advise. Thanks, MI

## 2015-07-04 ENCOUNTER — Other Ambulatory Visit: Payer: Self-pay | Admitting: Cardiovascular Disease

## 2015-08-18 ENCOUNTER — Encounter: Payer: Self-pay | Admitting: Cardiovascular Disease

## 2015-08-18 ENCOUNTER — Ambulatory Visit (INDEPENDENT_AMBULATORY_CARE_PROVIDER_SITE_OTHER): Payer: Medicare Other | Admitting: Cardiovascular Disease

## 2015-08-18 VITALS — BP 130/68 | HR 60 | Ht 64.0 in | Wt 176.8 lb

## 2015-08-18 DIAGNOSIS — I480 Paroxysmal atrial fibrillation: Secondary | ICD-10-CM | POA: Diagnosis not present

## 2015-08-18 DIAGNOSIS — I10 Essential (primary) hypertension: Secondary | ICD-10-CM

## 2015-08-18 LAB — HEPATIC FUNCTION PANEL
ALT: 15 U/L (ref 6–29)
AST: 19 U/L (ref 10–35)
Albumin: 4.5 g/dL (ref 3.6–5.1)
Alkaline Phosphatase: 65 U/L (ref 33–130)
BILIRUBIN DIRECT: 0.3 mg/dL — AB (ref <=0.2)
BILIRUBIN TOTAL: 1.1 mg/dL (ref 0.2–1.2)
Indirect Bilirubin: 0.8 mg/dL (ref 0.2–1.2)
Total Protein: 7.5 g/dL (ref 6.1–8.1)

## 2015-08-18 LAB — BASIC METABOLIC PANEL
BUN: 19 mg/dL (ref 7–25)
CHLORIDE: 99 mmol/L (ref 98–110)
CO2: 29 mmol/L (ref 20–31)
Calcium: 9.4 mg/dL (ref 8.6–10.4)
Creat: 0.91 mg/dL (ref 0.60–0.93)
GLUCOSE: 91 mg/dL (ref 65–99)
POTASSIUM: 4.4 mmol/L (ref 3.5–5.3)
SODIUM: 139 mmol/L (ref 135–146)

## 2015-08-18 LAB — LIPID PANEL
CHOL/HDL RATIO: 3.4 ratio (ref <=5.0)
Cholesterol: 158 mg/dL (ref 125–200)
HDL: 47 mg/dL (ref >=46)
LDL CALC: 94 mg/dL (ref <130)
Triglycerides: 83 mg/dL (ref <150)
VLDL: 17 mg/dL (ref <30)

## 2015-08-18 NOTE — Progress Notes (Signed)
Cardiology Office Note   Date:  08/18/2015   ID:  Yolanda Bell, Yolanda Bell 03-Jan-1941, MRN [id number]  PCP:  Gennette Pac, MD  Cardiologist:   Thayer Headings, MD   Chief Complaint  Patient presents with  . Follow-up   1. Constrictive Pericarditis 2. A-Flutter / Fibrillation 3. Gout 4. Spastic dysphonia 5. SVT 6. HTN  History of Present Illness:  She has done well from a cardiac standpoint. She has had a flare up of gout which has affected both feet. She has been taking indocin for the flare ups.   She had a bad accident in February. She was getting off an airplane and tripped on the off ramp in [place]. She broke both shoulders, left thumb and had soft tissue injury to her left foot. She was treated conservatively (bilateral arm slings). Her left thumb fracture was not found until she returned to [place]. She had some tachypalpitations for 22 hours during her recovery. Her HR was 130-140 for 22 hours and resolved spontaneously. I suspect she had a brief episode of atrial flutter.  She was quite emotional when she was telling me the issues of her fall. It is apparently still is quite upsetting to her.  October 19, 2012: She presents today for further evaluation of atrial fib / flutter that was found at cardiac rehab. She really cannot tell that her HR is fast and irregular. She can feel her HR when she is lying down. She is asymptomatic when she is up and busy doing chores. She denies any chest pain or dyspnea.   Feb. 4, 2014: Yolanda Bell seems to have rapid atrial fibrillation. [name] on relatively high dose of metoprolol XL and also is on when necessary propranolol. He is tolerating the atrial ablation but certainly does not feel as well as she would like to. [name] here to schedule her cardioversion next week. She denies any angina. She does have some shortness of breath.  January 01, 2013:  She has maintained NSR since her cardioversion ( Feb. 11, 2014)  . She was quite frustrated with her recent Out patient area / endoscopy department cardioversion. Lots of computer issues / scheduling issues.   She has done well from a cardiac standpoint.   Sept. 22, 2014:  She has done well. She had some redness and tenderness just below her right eye. She is not having any cardiac problems.   January 14, 2014: She has done well. She had a brief episode of tachycardia that lasted for several days. Sounds like an episode of SVT. She tried some propranolol and eventually resolved after about 5-6 hours.   June 07, 2014:  Sh[place] presents with several weeks of palpitations. She came in today and was noted to be in atrial fibrillation. She started back on her xarelto 4 days ago. Her HR is well controlled  Oct. 5, 2015:  Calaya had a cardioversion August 31. She had a brief episode of irregular Hr - she took some propraoolol. . She thinks that she eventually converted back to NSR. In looking back , these were probably PVCs.   May , 10 , 2016;   Yolanda Bell is a 74 y.o. female who presents for follow up of her atrial fib She was cardioverted last august.   She was found to have a few RBCs in a UA - no obvious blood   Nov. 7, 2016:  Doing well from a cardiac standpoint.  No CP , no dyspnea.    Past  Medical History  Diagnosis Date  . Constrictive pericarditis     2 status post pericardial stripping at the [facility]  . Atrial fibrillation ([place])     Status post TEE cardioversion  . Spastic dysphonia   . Supraventricular tachycardia ([place])     Status post RF ablation by Dr. Peterson Lombard  . Hypertension   . Gout     Past Surgical History  Procedure Laterality Date  . Cardioversion  11/21/2012    Procedure: CARDIOVERSION;  Surgeon: Darlin Coco, MD;  Location: [facility] ENDOSCOPY;  Service: Cardiovascular;;  . Pericardiectomy    . Cholecystectomy      1989  . Abdominal hysterectomy      1987  . Tonsillectomy    . Eye surgery       cataracts  . Tee without cardioversion N/A 06/10/2014    Procedure: TRANSESOPHAGEAL ECHOCARDIOGRAM (TEE);  Surgeon: Thayer Headings, MD;  Location: [facility];  Service: Cardiovascular;  Laterality: N/A;  . Cardioversion N/A 06/10/2014    Procedure: CARDIOVERSION;  Surgeon: Thayer Headings, MD;  Location: [facility] ENDOSCOPY;  Service: Cardiovascular;  Laterality: N/A;     Current Outpatient Prescriptions  Medication Sig Dispense Refill  . alendronate (FOSAMAX) 70 MG tablet Take 70 mg by mouth once a week.  4  . Cholecalciferol (VITAMIN D-3 PO) Take 2,000 mg by mouth daily.     . indomethacin (INDOCIN) 25 MG capsule Take 25 mg by mouth 2 (two) times daily as needed.     Marland Kitchen KLOR-CON M10 10 MEQ tablet TAKE 1 TABLET (10 MEQ TOTAL) BY MOUTH DAILY. 90 tablet 3  . metoprolol succinate (TOPROL-XL) 50 MG 24 hr tablet Take 1 tablet (50 mg total) by mouth daily. 90 tablet 3  . Misc Natural Products (GLUCOSAMINE CHOND COMPLEX/MSM) TABS Take 1 tablet by mouth 2 (two) times daily. Triple strength    . propranolol (INDERAL) 10 MG tablet Take 10 mg by mouth 4 (four) times daily as needed (FOR PALPITATIONS).     Marland Kitchen spironolactone (ALDACTONE) 50 MG tablet Take 50 mg by mouth daily.    Marland Kitchen spironolactone (ALDACTONE) 50 MG tablet Take 1 tablet (50 mg total) by mouth once. 90 tablet 3  . torsemide (DEMADEX) 20 MG tablet Take 1 tablet (20 mg total) by mouth daily. 90 tablet 3  . XARELTO 20 MG TABS tablet TAKE 1 TABLET BY MOUTH EVERY DAY WITH SUPPER 30 tablet 5   No current facility-administered medications for this visit.    Allergies:   Amiodarone; Other; and Azithromycin    Social History:  The patient  reports that she has never smoked. She has never used smokeless tobacco. She reports that she drinks alcohol. She reports that she does not use illicit drugs.   Family History:  The patient's family history includes Breast cancer in her sister; Emphysema in her father; Heart disease in her mother.    ROS:   Please see the history of present illness.    Review of Systems: Constitutional:  denies fever, chills, diaphoresis, appetite change and fatigue.  HEENT: denies photophobia, eye pain, redness, hearing loss, ear pain, congestion, sore throat, rhinorrhea, sneezing, neck pain, neck stiffness and tinnitus.  Respiratory: denies SOB, DOE, cough, chest tightness, and wheezing.  Cardiovascular: denies chest pain, palpitations and leg swelling.  Gastrointestinal: denies nausea, vomiting, abdominal pain, diarrhea, constipation, blood in stool.  Genitourinary: denies dysuria, urgency, frequency, hematuria, flank pain and difficulty urinating.  Musculoskeletal: denies  myalgias, back pain, joint swelling, arthralgias and  gait problem.   Skin: denies pallor, rash and wound.  Neurological: denies dizziness, seizures, syncope, weakness, light-headedness, numbness and headaches.   Hematological: denies adenopathy, easy bruising, personal or family bleeding history.  Psychiatric/ Behavioral: denies suicidal ideation, mood changes, confusion, nervousness, sleep disturbance and agitation.       All other systems are reviewed and negative.    PHYSICAL EXAM: VS:  BP 130/68 mmHg  Pulse 60  Ht 5\' 4"  (1.626 m)  Wt 176 lb 12.8 oz (80.196 kg)  BMI 30.33 kg/m2  SpO2 99% , BMI Body mass index is 30.33 kg/(m^2). GEN: Well nourished, well developed, in no acute distress HEENT: normal Neck: no JVD, carotid bruits, or masses Cardiac: RRR; no murmurs, rubs, or gallops,no edema  Respiratory:  clear to auscultation bilaterally, normal work of breathing GI: soft, nontender, nondistended, + BS MS: no deformity or atrophy Skin: warm and dry, no rash Neuro:  Strength and sensation are intact Psych: normal   EKG:  EKG is ordered today. The ekg ordered today demonstrates   NSR at 75. NS ST abn.    Recent Labs: No results found for requested labs within last 365 days.    Lipid Panel    Component Value  Date/Time   CHOL 169 01/14/2014 1003   TRIG 129.0 01/14/2014 1003   HDL 43.20 01/14/2014 1003   CHOLHDL 4 01/14/2014 1003   VLDL 25.8 01/14/2014 1003   LDLCALC 100* 01/14/2014 1003      Wt Readings from Last 3 Encounters:  08/18/15 176 lb 12.8 oz (80.196 kg)  02/18/15 180 lb 12.8 oz (82.01 kg)  07/15/14 180 lb 1.9 oz (81.702 kg)      Other studies Reviewed: Additional studies/ records that were reviewed today include: . Review of the above records demonstrates:    ASSESSMENT AND PLAN:  1. Constrictive Pericarditis - status post pericardectomy. 2. A-Flutter / Fibrillation - continue current dose of Xarelto. She has maintained normal sinus rhythm. 3. Gout 4. Spastic dysphonia 5. SVT 6. HTN - blood pressures well-controlled.  Current medicines are reviewed at length with the patient today.  The patient does not have concerns regarding medicines.  The following changes have been made:  no change  Labs/ tests ordered today include:  No orders of the defined types were placed in this encounter.     Disposition:   FU with me in 6 months      Yolanda Bell, Wonda Cheng, MD  08/18/2015 11:31 AM    [facility] [place], [place], [place]  [address] Phone: [phone]; Fax: [phone]   [facility]  [address] [place], [place]  [address] [phone]    Fax [phone]

## 2015-08-18 NOTE — Patient Instructions (Signed)
Medication Instructions:  Your physician recommends that you continue on your current medications as directed. Please refer to the Current Medication list given to you today.   Labwork: Bmet, Lft, Lipid today   Testing/Procedures: None ordered  Follow-Up: Your physician wants you to follow-up in: 6 months with Dr.Nahser You will receive a reminder letter in the mail two months in advance. If you don't receive a letter, please call our office to schedule the follow-up appointment.   Any Other Special Instructions Will Be Listed Below (If Applicable).     If you need a refill on your cardiac medications before your next appointment, please call your pharmacy.

## 2015-09-01 ENCOUNTER — Other Ambulatory Visit: Payer: Self-pay | Admitting: Cardiovascular Disease

## 2015-09-01 MED ORDER — RIVAROXABAN 20 MG PO TABS: ORAL_TABLET | ORAL | Status: DC

## 2015-09-25 ENCOUNTER — Other Ambulatory Visit: Payer: Self-pay | Admitting: Cardiovascular Disease

## 2015-10-07 ENCOUNTER — Telehealth: Payer: Self-pay | Admitting: Cardiovascular Disease

## 2015-10-07 ENCOUNTER — Other Ambulatory Visit: Payer: Self-pay | Admitting: Cardiovascular Disease

## 2015-10-07 NOTE — Telephone Encounter (Signed)
New problem   Pt has been in afib and flutter since 4am Sunday morning. Pt need to speak to nurse. Pt stated she isn't having any chest pain or sob.

## 2015-10-07 NOTE — Telephone Encounter (Signed)
Will have her see PA tomorrow She is on Xarelto. She can be cardioverted if she is still in atrial fib

## 2015-10-07 NOTE — Telephone Encounter (Signed)
Patient st one 12/11, she woke up in the middle of the night in afib. She took a propanolol and converted about 5 hours later. Christmas morning, she woke up at 0400 with her heart fluttering "very fast."  She took another propanolol, and her HR slowed, but never converted to NSR. She has been in afib since. She st her HR is "all over the place." Sometimes it will be faster, and she st she lies down and it slows down. Her HR is currently "normal for her" in the 60's-70's. Patient c/o no other symptoms, but requests an ASAP OV with Dr. Acie Fredrickson. Informed patient Dr. Acie Fredrickson will not be in the office for almost 2 weeks. Patient is extremely anxious and requests a cardioversion or ECHO scheduled. Scheduled patient with Lyda Jester, PA tomorrow for evaluation.

## 2015-10-08 ENCOUNTER — Other Ambulatory Visit: Payer: Self-pay | Admitting: Physician Assistant

## 2015-10-08 ENCOUNTER — Ambulatory Visit (INDEPENDENT_AMBULATORY_CARE_PROVIDER_SITE_OTHER): Payer: Medicare Other | Admitting: Physician Assistant

## 2015-10-08 ENCOUNTER — Encounter: Payer: Self-pay | Admitting: Cardiology

## 2015-10-08 VITALS — BP 116/64 | HR 136 | Ht 64.0 in | Wt 174.8 lb

## 2015-10-08 DIAGNOSIS — I471 Supraventricular tachycardia: Secondary | ICD-10-CM | POA: Diagnosis not present

## 2015-10-08 DIAGNOSIS — I4891 Unspecified atrial fibrillation: Secondary | ICD-10-CM | POA: Diagnosis not present

## 2015-10-08 DIAGNOSIS — Z01812 Encounter for preprocedural laboratory examination: Secondary | ICD-10-CM

## 2015-10-08 LAB — PROTIME-INR
INR: 1.3 (ref <1.50)
Prothrombin Time: 16.4 seconds — ABNORMAL HIGH (ref 11.6–15.2)

## 2015-10-08 LAB — CBC WITH DIFFERENTIAL/PLATELET
Basophils Absolute: 0 10*3/uL (ref 0.0–0.1)
Basophils Relative: 0 % (ref 0–1)
Eosinophils Absolute: 0.1 10*3/uL (ref 0.0–0.7)
Eosinophils Relative: 1 % (ref 0–5)
HEMATOCRIT: 42.7 % (ref 36.0–46.0)
Hemoglobin: 14.5 g/dL (ref 12.0–15.0)
LYMPHS ABS: 2.6 10*3/uL (ref 0.7–4.0)
LYMPHS PCT: 31 % (ref 12–46)
MCH: 29.1 pg (ref 26.0–34.0)
MCHC: 34 g/dL (ref 30.0–36.0)
MCV: 85.7 fL (ref 78.0–100.0)
MONO ABS: 0.5 10*3/uL (ref 0.1–1.0)
MPV: 10.2 fL (ref 8.6–12.4)
Monocytes Relative: 6 % (ref 3–12)
Neutro Abs: 5.2 10*3/uL (ref 1.7–7.7)
Neutrophils Relative %: 62 % (ref 43–77)
Platelets: 251 10*3/uL (ref 150–400)
RBC: 4.98 MIL/uL (ref 3.87–5.11)
RDW: 14.3 % (ref 11.5–15.5)
WBC: 8.4 10*3/uL (ref 4.0–10.5)

## 2015-10-08 LAB — BASIC METABOLIC PANEL
BUN: 23 mg/dL (ref 7–25)
CHLORIDE: 100 mmol/L (ref 98–110)
CO2: 26 mmol/L (ref 20–31)
Calcium: 9.4 mg/dL (ref 8.6–10.4)
Creat: 0.95 mg/dL — ABNORMAL HIGH (ref 0.60–0.93)
GLUCOSE: 92 mg/dL (ref 65–99)
POTASSIUM: 4.2 mmol/L (ref 3.5–5.3)
SODIUM: 141 mmol/L (ref 135–146)

## 2015-10-08 LAB — TSH: TSH: 1.274 u[IU]/mL (ref 0.350–4.500)

## 2015-10-08 MED ORDER — METOPROLOL SUCCINATE ER 50 MG PO TB24: 50.0000 mg | ORAL_TABLET | Freq: Two times a day (BID) | ORAL | Status: DC

## 2015-10-08 NOTE — Patient Instructions (Addendum)
Medication Instructions:  Your physician has recommended you make the following change in your medication:  1- increase metoprolol 50 mg by mouth twice daily.  Labwork: Your physician recommends that you have lab work today BMET, CBC, TSH, and INR/PT   Testing/Procedures: Your physician has requested that you have a cardioversion on 10/14/2015 with Dr. Johnsie Cancel. Follow-Up: Your physician wants you to follow-up in: 2 weeks with Dr. Cathie Olden. You will receive a reminder letter in the mail two months in advance. If you don't receive a letter, please call our office to schedule the follow-up appointment.   If you need a refill on your cardiac medications before your next appointment, please call your pharmacy.

## 2015-10-08 NOTE — Progress Notes (Signed)
Cardiology Office Note   Date:  10/08/2015   ID:  Yolanda Bell, Yolanda Bell Sep 27, 1941, MRN [id number]  PCP:  Gennette Pac, MD  Cardiologist:  Dr. Acie Fredrickson   Chief Complaint  Patient presents with  . Follow-up    seen for Dr. Acie Fredrickson  . Atrial Fibrillation      History of Present Illness: Yolanda Bell is a 74 y.o. female who presents for flex clinic office visit. She has PMH of constrictive pericarditis s/p pericardial stripping at [facility] 2006, SVT s/p RFA by Dr. Caryl Comes, HTN, gout and afib/aflutter currently on Xarelto. She previously underwent DCCV in Feb 2014 however had recurrence since. She most recently underwent TEE DCCV on 06/10/2014. TEE on on that day showed EF 45-50%, mild to moderate TR, moderate MR. Patient has cardiac awareness of afib with chest pressure and palpitation. According to the patient, she woke up with atrial fibrillation symptom on 12/11, she took propranolol and converted after 5 hours. She again woke on Christmas morning around 4AM with palpitation, this time, she stayed in afib. She denies any chest pressure when she was in NSR, she says she only get chest pressure during afib. She has abnormal EKG with TWI in anterior lead since at least 2014, I do not see any prior stress test since early 2000s.   She presents today to Flex clinic complain of symptomatic afib. She denies any chest pain, only pressure which is her normal afib symptom. She denies any recent SOB, LE edema, orthopnea or PND. She says she has been compliant with Xarelto. She is tearful today given recurrence of symptom.     Past Medical History  Diagnosis Date  . Constrictive pericarditis     2 status post pericardial stripping at the [facility]  . Atrial fibrillation ([place])     Status post TEE cardioversion  . Spastic dysphonia   . Supraventricular tachycardia ([place])     Status post RF ablation by Dr. Peterson Lombard  . Hypertension   . Gout     Past Surgical History  Procedure  Laterality Date  . Cardioversion  11/21/2012    Procedure: CARDIOVERSION;  Surgeon: Darlin Coco, MD;  Location: [facility] ENDOSCOPY;  Service: Cardiovascular;;  . Pericardiectomy    . Cholecystectomy      1989  . Abdominal hysterectomy      1987  . Tonsillectomy    . Eye surgery      cataracts  . Tee without cardioversion N/A 06/10/2014    Procedure: TRANSESOPHAGEAL ECHOCARDIOGRAM (TEE);  Surgeon: Thayer Headings, MD;  Location: [place];  Service: Cardiovascular;  Laterality: N/A;  . Cardioversion N/A 06/10/2014    Procedure: CARDIOVERSION;  Surgeon: Thayer Headings, MD;  Location: [facility] ENDOSCOPY;  Service: Cardiovascular;  Laterality: N/A;     Current Outpatient Prescriptions  Medication Sig Dispense Refill  . alendronate (FOSAMAX) 70 MG tablet Take 70 mg by mouth once a week.  4  . Cholecalciferol (VITAMIN D-3 PO) Take 1,000 mg by mouth daily.     . indomethacin (INDOCIN) 25 MG capsule Take 25 mg by mouth 2 (two) times daily as needed (GOUT).     Marland Kitchen KLOR-CON M10 10 MEQ tablet TAKE 1 TABLET (10 MEQ TOTAL) BY MOUTH DAILY. 90 tablet 3  . metoprolol succinate (TOPROL-XL) 50 MG 24 hr tablet Take 1 tablet (50 mg total) by mouth 2 (two) times daily. Take with or immediately following a meal. 180 tablet 3  . Misc Natural Products (GLUCOSAMINE  CHOND COMPLEX/MSM) TABS Take 1 tablet by mouth 2 (two) times daily. Triple strength    . propranolol (INDERAL) 10 MG tablet Take 10 mg by mouth 4 (four) times daily as needed (FOR PALPITATIONS).     . rivaroxaban (XARELTO) 20 MG TABS tablet TAKE 1 TABLET BY MOUTH EVERY DAY WITH SUPPER 90 tablet 2  . spironolactone (ALDACTONE) 50 MG tablet Take 50 mg by mouth daily.    Marland Kitchen torsemide (DEMADEX) 20 MG tablet TAKE 1 TABLET (20 MG TOTAL) BY MOUTH DAILY. 90 tablet 3   No current facility-administered medications for this visit.    Allergies:   Amiodarone; Other; and Azithromycin    Social History:  The patient  reports that she has never smoked. She has never  used smokeless tobacco. She reports that she drinks alcohol. She reports that she does not use illicit drugs.   Family History:  The patient's family history includes Breast cancer in her sister; Emphysema in her father; Heart disease in her mother.    ROS:  Please see the history of present illness.   Otherwise, review of systems are positive for palpitation, chest pressure.   All other systems are reviewed and negative.    PHYSICAL EXAM: VS:  BP 116/64 mmHg  Pulse 136  Ht 5\' 4"  (1.626 m)  Wt 174 lb 12.8 oz (79.289 kg)  BMI 29.99 kg/m2 , BMI Body mass index is 29.99 kg/(m^2). GEN: Well nourished, well developed, in no acute distress HEENT: normal Neck: no JVD, carotid bruits, or masses Cardiac: Tachycardia, irregular.; no murmurs, rubs, or gallops,no edema  Respiratory:  clear to auscultation bilaterally, normal work of breathing GI: soft, nontender, nondistended, + BS MS: no deformity or atrophy Skin: warm and dry, no rash Neuro:  Strength and sensation are intact Psych: euthymic mood, full affect   EKG:  EKG is ordered today. The ekg ordered today demonstrates afib with RVR with HR 136   Recent Labs: 08/18/2015: ALT 15; BUN 19; Creat 0.91; Potassium 4.4; Sodium 139    Lipid Panel    Component Value Date/Time   CHOL 158 08/18/2015 1147   TRIG 83 08/18/2015 1147   HDL 47 08/18/2015 1147   CHOLHDL 3.4 08/18/2015 1147   VLDL 17 08/18/2015 1147   LDLCALC 94 08/18/2015 1147      Wt Readings from Last 3 Encounters:  10/08/15 174 lb 12.8 oz (79.289 kg)  08/18/15 176 lb 12.8 oz (80.196 kg)  02/18/15 180 lb 12.8 oz (82.01 kg)      Other studies Reviewed: Additional studies/ records that were reviewed today include:   Previous office note  TEE 06/10/2014 LV EF: 45% -  50%  ------------------------------------------------------------------- History:  PMH:  Atrial fibrillation.  ------------------------------------------------------------------- Study  Conclusions  - Left ventricle: Systolic function was mildly reduced. The estimated ejection fraction was in the range of 45% to 50%. No evidence of thrombus. - Mitral valve: There was moderate regurgitation. - Left atrium: No evidence of thrombus in the atrial cavity or appendage. - Tricuspid valve: There was mild-moderate regurgitation.  Impressions:  - Successful cardioversion. No cardiac source of emboli was indentified.   Review of the above records demonstrates:  Recurrent afib with RVR   ASSESSMENT AND PLAN:  1.  Recurrent atrial fibrillation with RVR  - she is symptomatic with afib with palpitation and chest pressure. Fortunately, her BP is good. Will increase Toprol XL 50mg  to BID to control HR better. Obtain TSH.   - review previous EKG shows TWI  in anterior lead since last least 2014, no recent ischemic workup. Will defer ischemic workup for now as patient does not have any obvious angina when in NSR, her chest pressure occurs only with afib.   - since she has been compliant with Xarelto, plan for DCCV as soon as can be arranged without TEE  - her EF in 2015 on TEE was 45-50%, will defer to primary cardiology regarding whether to repeat Echo after DCCV   2. constrictive pericarditis s/p pericardial stripping at [facility] 2006 3. h/o SVT s/p RFA by Dr. Caryl Comes 4. HTN: increase metoprolol XL to BID   Current medicines are reviewed at length with the patient today.  The patient does not have concerns regarding medicines.  The following changes have been made:  Increase Toprol XL to 50mg  BID  Labs/ tests ordered today include:   Orders Placed This Encounter  Procedures  . Basic Metabolic Panel (BMET)  . CBC with Differential  . INR/PT  . TSH  . EKG 12-Lead     Disposition:   FU with Dr. Acie Fredrickson in 2 weeks  Signed, Almyra Deforest PA 10/08/2015 [facility] [place], [place], [place]  [address] Phone: [phone];  Fax: [phone]

## 2015-10-10 ENCOUNTER — Telehealth: Payer: Self-pay | Admitting: *Deleted

## 2015-10-10 NOTE — Telephone Encounter (Signed)
Called pt to let her know that her labs were normal.  She was advised that hyperthyroidism could cause A-fib so we checked hers and it was normal so we know that this is not the cause.  Pt is scheduled for Cardioversion 10/14/15.  Pt verbalized understanding.

## 2015-10-10 NOTE — Telephone Encounter (Signed)
-----   Message from [place], [place] sent at 10/10/2015  8:57 AM EST ----- Pre procedure lab work normal, note hyperthyroidism can cause afib, but your thyroid level is normal which is reassuring. Normal kidney function. Normal electrolyte, normal white blood cell count and red blood cell count

## 2015-10-14 ENCOUNTER — Encounter (HOSPITAL_COMMUNITY)
Admission: RE | Disposition: A | Discharge status: Home / Self Care | Payer: Self-pay | Source: Ambulatory Visit | Attending: Cardiovascular Disease

## 2015-10-14 ENCOUNTER — Ambulatory Visit (HOSPITAL_COMMUNITY)
Admission: RE | Admit: 2015-10-14 | Discharge: 2015-10-14 | Disposition: A | Discharge status: Home / Self Care | Payer: Medicare Other | Source: Ambulatory Visit | Attending: Cardiovascular Disease | Admitting: Cardiovascular Disease

## 2015-10-14 ENCOUNTER — Ambulatory Visit (HOSPITAL_COMMUNITY): Payer: Medicare Other | Admitting: Certified Registered Nurse Anesthetist

## 2015-10-14 ENCOUNTER — Encounter (HOSPITAL_COMMUNITY): Payer: Self-pay | Admitting: Certified Registered Nurse Anesthetist

## 2015-10-14 DIAGNOSIS — I10 Essential (primary) hypertension: Secondary | ICD-10-CM | POA: Diagnosis not present

## 2015-10-14 DIAGNOSIS — M109 Gout, unspecified: Secondary | ICD-10-CM | POA: Diagnosis not present

## 2015-10-14 DIAGNOSIS — Z79899 Other long term (current) drug therapy: Secondary | ICD-10-CM | POA: Diagnosis not present

## 2015-10-14 DIAGNOSIS — I4891 Unspecified atrial fibrillation: Secondary | ICD-10-CM | POA: Diagnosis not present

## 2015-10-14 DIAGNOSIS — Z7901 Long term (current) use of anticoagulants: Secondary | ICD-10-CM | POA: Diagnosis not present

## 2015-10-14 HISTORY — PX: CARDIOVERSION: SHX1299

## 2015-10-14 SURGERY — CARDIOVERSION
Anesthesia: Monitor Anesthesia Care

## 2015-10-14 MED ORDER — LIDOCAINE HCL (CARDIAC) 20 MG/ML IV SOLN
INTRAVENOUS | Status: DC | PRN
Start: 2015-10-14 — End: 2015-10-14
  Administered 2015-10-14: 40 mg via INTRAVENOUS

## 2015-10-14 MED ORDER — PROPOFOL 10 MG/ML IV BOLUS
INTRAVENOUS | Status: DC | PRN
  Administered 2015-10-14: 40 mg via INTRAVENOUS
  Administered 2015-10-14: 20 mg via INTRAVENOUS

## 2015-10-14 MED ORDER — SODIUM CHLORIDE 0.9 % IV SOLN
INTRAVENOUS | Status: DC | PRN
  Administered 2015-10-14: 12:00:00 via INTRAVENOUS

## 2015-10-14 NOTE — Interval H&P Note (Signed)
History and Physical Interval Note:  10/14/2015 8:11 AM  Yolanda Bell  has presented today for surgery, with the diagnosis of AFIB  The various methods of treatment have been discussed with the patient and family. After consideration of risks, benefits and other options for treatment, the patient has consented to  Procedure(s): CARDIOVERSION (N/A) as a surgical intervention .  The patient's history has been reviewed, patient examined, no change in status, stable for surgery.  I have reviewed the patient's chart and labs.  Questions were answered to the patient's satisfaction.     Jenkins Rouge

## 2015-10-14 NOTE — Discharge Instructions (Signed)
Monitored Anesthesia Care Monitored anesthesia care is an anesthesia service for a medical procedure. Anesthesia is the loss of the ability to feel pain. It is produced by medicines called anesthetics. It may affect a small area of your body (local anesthesia), a large area of your body (regional anesthesia), or your entire body (general anesthesia). The need for monitored anesthesia care depends your procedure, your condition, and the potential need for regional or general anesthesia. It is often provided during procedures where:   General anesthesia may be needed if there are complications. This is because you need special care when you are under general anesthesia.   You will be under local or regional anesthesia. This is so that you are able to have higher levels of anesthesia if needed.   You will receive calming medicines (sedatives). This is especially the case if sedatives are given to put you in a semi-conscious state of relaxation (deep sedation). This is because the amount of sedative needed to produce this state can be hard to predict. Too much of a sedative can produce general anesthesia. Monitored anesthesia care is performed by one or more health care providers who have special training in all types of anesthesia. You will need to meet with these health care providers before your procedure. During this meeting, they will ask you about your medical history. They will also give you instructions to follow. (For example, you will need to stop eating and drinking before your procedure. You may also need to stop or change medicines you are taking.) During your procedure, your health care providers will stay with you. They will:   Watch your condition. This includes watching your blood pressure, breathing, and level of pain.   Diagnose and treat problems that occur.   Give medicines if they are needed. These may include calming medicines (sedatives) and anesthetics.   Make sure you are  comfortable.  Having monitored anesthesia care does not necessarily mean that you will be under anesthesia. It does mean that your health care providers will be able to manage anesthesia if you need it or if it occurs. It also means that you will be able to have a different type of anesthesia than you are having if you need it. When your procedure is complete, your health care providers will continue to watch your condition. They will make sure any medicines wear off before you are allowed to go home.    This information is not intended to replace advice given to you by your health care provider. Make sure you discuss any questions you have with your health care provider.   Document Released: 06/23/2005 Document Revised: 10/18/2014 Document Reviewed: 11/08/2012 Elsevier Interactive Patient Education 2016 [employer]. Hospital doctor cardioversion is the delivery of a jolt of electricity to change the rhythm of the heart. Sticky patches or metal paddles are placed on the chest to deliver the electricity from a device. This is done to restore a normal rhythm. A rhythm that is too fast or not regular keeps the heart from pumping well. Electrical cardioversion is done in an emergency if:   There is low or no blood pressure as a result of the heart rhythm.   Normal rhythm must be restored as fast as possible to protect the brain and heart from further damage.   It may save a life. Cardioversion may be done for heart rhythms that are not immediately life threatening, such as atrial fibrillation or flutter, in which:   The heart  is beating too fast or is not regular.   Medicine to change the rhythm has not worked.   It is safe to wait in order to allow time for preparation.  Symptoms of the abnormal rhythm are bothersome.  The risk of stroke and other serious problems can be reduced. LET [facility] CARE PROVIDER KNOW ABOUT:   Any allergies you have.  All medicines  you are taking, including vitamins, herbs, eye drops, creams, and over-the-counter medicines.  Previous problems you or members of your family have had with the use of anesthetics.   Any blood disorders you have.   Previous surgeries you have had.   Medical conditions you have. RISKS AND COMPLICATIONS  Generally, this is a safe procedure. However, problems can occur and include:   Breathing problems related to the anesthetic used.  A blood clot that breaks free and travels to other parts of your body. This could cause a stroke or other problems. The risk of this is lowered by use of blood-thinning medicine (anticoagulant) prior to the procedure.  Cardiac arrest (rare). BEFORE THE PROCEDURE   You may have tests to detect blood clots in your heart and to evaluate heart function.  You may start taking anticoagulants so your blood does not clot as easily.   Medicines may be given to help stabilize your heart rate and rhythm. PROCEDURE  You will be given medicine through an IV tube to reduce discomfort and make you sleepy (sedative).   An electrical shock will be delivered. AFTER THE PROCEDURE Your heart rhythm will be watched to make sure it does not change.    This information is not intended to replace advice given to you by your health care provider. Make sure you discuss any questions you have with your health care provider.   Document Released: 09/17/2002 Document Revised: 10/18/2014 Document Reviewed: 04/11/2013 Elsevier Interactive Patient Education [employer].

## 2015-10-14 NOTE — Transfer of Care (Signed)
Immediate Anesthesia Transfer of Care Note  Patient: Yolanda Bell  Procedure(s) Performed: Procedure(s): CARDIOVERSION (N/A)  Patient Location: Endoscopy Unit  Anesthesia Type:General  Level of Consciousness: awake and alert   Airway & Oxygen Therapy: Patient Spontanous Breathing and Patient connected to nasal cannula oxygen  Post-op Assessment: Report given to RN and Post -op Vital signs reviewed and stable  Post vital signs: Reviewed and stable  Last Vitals:  Filed Vitals:   10/14/15 1239 10/14/15 1240  BP: 105/58   Pulse: 70 70  Temp:    Resp: 18 22    Complications: No apparent anesthesia complications

## 2015-10-14 NOTE — H&P (View-Only) (Signed)
Cardiology Office Note   Date:  10/08/2015   ID:  Yolanda, Bell August 03, 1941, MRN [id number]  PCP:  Gennette Pac, MD  Cardiologist:  Dr. Acie Fredrickson   Chief Complaint  Patient presents with  . Follow-up    seen for Dr. Acie Fredrickson  . Atrial Fibrillation      History of Present Illness: Yolanda Bell is a 75 y.o. female who presents for flex clinic office visit. She has PMH of constrictive pericarditis s/p pericardial stripping at [facility] 2006, SVT s/p RFA by Dr. Caryl Comes, HTN, gout and afib/aflutter currently on Xarelto. She previously underwent DCCV in Feb 2014 however had recurrence since. She most recently underwent TEE DCCV on 06/10/2014. TEE on on that day showed EF 45-50%, mild to moderate TR, moderate MR. Patient has cardiac awareness of afib with chest pressure and palpitation. According to the patient, she woke up with atrial fibrillation symptom on 12/11, she took propranolol and converted after 5 hours. She again woke on Christmas morning around 4AM with palpitation, this time, she stayed in afib. She denies any chest pressure when she was in NSR, she says she only get chest pressure during afib. She has abnormal EKG with TWI in anterior lead since at least 2014, I do not see any prior stress test since early 2000s.   She presents today to Flex clinic complain of symptomatic afib. She denies any chest pain, only pressure which is her normal afib symptom. She denies any recent SOB, LE edema, orthopnea or PND. She says she has been compliant with Xarelto. She is tearful today given recurrence of symptom.     Past Medical History  Diagnosis Date  . Constrictive pericarditis     2 status post pericardial stripping at the [facility]  . Atrial fibrillation ([place])     Status post TEE cardioversion  . Spastic dysphonia   . Supraventricular tachycardia ([place])     Status post RF ablation by Dr. Peterson Lombard  . Hypertension   . Gout     Past Surgical History  Procedure  Laterality Date  . Cardioversion  11/21/2012    Procedure: CARDIOVERSION;  Surgeon: Darlin Coco, MD;  Location: [facility] ENDOSCOPY;  Service: Cardiovascular;;  . Pericardiectomy    . Cholecystectomy      1989  . Abdominal hysterectomy      1987  . Tonsillectomy    . Eye surgery      cataracts  . Tee without cardioversion N/A 06/10/2014    Procedure: TRANSESOPHAGEAL ECHOCARDIOGRAM (TEE);  Surgeon: Thayer Headings, MD;  Location: [place];  Service: Cardiovascular;  Laterality: N/A;  . Cardioversion N/A 06/10/2014    Procedure: CARDIOVERSION;  Surgeon: Thayer Headings, MD;  Location: [facility] ENDOSCOPY;  Service: Cardiovascular;  Laterality: N/A;     Current Outpatient Prescriptions  Medication Sig Dispense Refill  . alendronate (FOSAMAX) 70 MG tablet Take 70 mg by mouth once a week.  4  . Cholecalciferol (VITAMIN D-3 PO) Take 1,000 mg by mouth daily.     . indomethacin (INDOCIN) 25 MG capsule Take 25 mg by mouth 2 (two) times daily as needed (GOUT).     Marland Kitchen KLOR-CON M10 10 MEQ tablet TAKE 1 TABLET (10 MEQ TOTAL) BY MOUTH DAILY. 90 tablet 3  . metoprolol succinate (TOPROL-XL) 50 MG 24 hr tablet Take 1 tablet (50 mg total) by mouth 2 (two) times daily. Take with or immediately following a meal. 180 tablet 3  . Misc Natural Products (GLUCOSAMINE  CHOND COMPLEX/MSM) TABS Take 1 tablet by mouth 2 (two) times daily. Triple strength    . propranolol (INDERAL) 10 MG tablet Take 10 mg by mouth 4 (four) times daily as needed (FOR PALPITATIONS).     . rivaroxaban (XARELTO) 20 MG TABS tablet TAKE 1 TABLET BY MOUTH EVERY DAY WITH SUPPER 90 tablet 2  . spironolactone (ALDACTONE) 50 MG tablet Take 50 mg by mouth daily.    Marland Kitchen torsemide (DEMADEX) 20 MG tablet TAKE 1 TABLET (20 MG TOTAL) BY MOUTH DAILY. 90 tablet 3   No current facility-administered medications for this visit.    Allergies:   Amiodarone; Other; and Azithromycin    Social History:  The patient  reports that she has never smoked. She has never  used smokeless tobacco. She reports that she drinks alcohol. She reports that she does not use illicit drugs.   Family History:  The patient's family history includes Breast cancer in her sister; Emphysema in her father; Heart disease in her mother.    ROS:  Please see the history of present illness.   Otherwise, review of systems are positive for palpitation, chest pressure.   All other systems are reviewed and negative.    PHYSICAL EXAM: VS:  BP 116/64 mmHg  Pulse 136  Ht 5\' 4"  (1.626 m)  Wt 174 lb 12.8 oz (79.289 kg)  BMI 29.99 kg/m2 , BMI Body mass index is 29.99 kg/(m^2). GEN: Well nourished, well developed, in no acute distress HEENT: normal Neck: no JVD, carotid bruits, or masses Cardiac: Tachycardia, irregular.; no murmurs, rubs, or gallops,no edema  Respiratory:  clear to auscultation bilaterally, normal work of breathing GI: soft, nontender, nondistended, + BS MS: no deformity or atrophy Skin: warm and dry, no rash Neuro:  Strength and sensation are intact Psych: euthymic mood, full affect   EKG:  EKG is ordered today. The ekg ordered today demonstrates afib with RVR with HR 136   Recent Labs: 08/18/2015: ALT 15; BUN 19; Creat 0.91; Potassium 4.4; Sodium 139    Lipid Panel    Component Value Date/Time   CHOL 158 08/18/2015 1147   TRIG 83 08/18/2015 1147   HDL 47 08/18/2015 1147   CHOLHDL 3.4 08/18/2015 1147   VLDL 17 08/18/2015 1147   LDLCALC 94 08/18/2015 1147      Wt Readings from Last 3 Encounters:  10/08/15 174 lb 12.8 oz (79.289 kg)  08/18/15 176 lb 12.8 oz (80.196 kg)  02/18/15 180 lb 12.8 oz (82.01 kg)      Other studies Reviewed: Additional studies/ records that were reviewed today include:   Previous office note  TEE 06/10/2014 LV EF: 45% -  50%  ------------------------------------------------------------------- History:  PMH:  Atrial fibrillation.  ------------------------------------------------------------------- Study  Conclusions  - Left ventricle: Systolic function was mildly reduced. The estimated ejection fraction was in the range of 45% to 50%. No evidence of thrombus. - Mitral valve: There was moderate regurgitation. - Left atrium: No evidence of thrombus in the atrial cavity or appendage. - Tricuspid valve: There was mild-moderate regurgitation.  Impressions:  - Successful cardioversion. No cardiac source of emboli was indentified.   Review of the above records demonstrates:  Recurrent afib with RVR   ASSESSMENT AND PLAN:  1.  Recurrent atrial fibrillation with RVR  - she is symptomatic with afib with palpitation and chest pressure. Fortunately, her BP is good. Will increase Toprol XL 50mg  to BID to control HR better. Obtain TSH.   - review previous EKG shows TWI  in anterior lead since last least 2014, no recent ischemic workup. Will defer ischemic workup for now as patient does not have any obvious angina when in NSR, her chest pressure occurs only with afib.   - since she has been compliant with Xarelto, plan for DCCV as soon as can be arranged without TEE  - her EF in 2015 on TEE was 45-50%, will defer to primary cardiology regarding whether to repeat Echo after DCCV   2. constrictive pericarditis s/p pericardial stripping at [facility] 2006 3. h/o SVT s/p RFA by Dr. Caryl Comes 4. HTN: increase metoprolol XL to BID   Current medicines are reviewed at length with the patient today.  The patient does not have concerns regarding medicines.  The following changes have been made:  Increase Toprol XL to 50mg  BID  Labs/ tests ordered today include:   Orders Placed This Encounter  Procedures  . Basic Metabolic Panel (BMET)  . CBC with Differential  . INR/PT  . TSH  . EKG 12-Lead     Disposition:   FU with Dr. Acie Fredrickson in 2 weeks  Signed, Almyra Deforest PA 10/08/2015 [facility] [place], [place], [place]  [address] Phone: [phone];  Fax: [phone]

## 2015-10-14 NOTE — Anesthesia Preprocedure Evaluation (Signed)
Anesthesia Evaluation  Patient identified by MRN, date of birth, ID band Patient awake    Reviewed: Allergy & Precautions, NPO status , Patient's Chart, lab work & pertinent test results  History of Anesthesia Complications Negative for: history of anesthetic complications  Airway Mallampati: II  TM Distance: >3 FB Neck ROM: Full    Dental no notable dental hx. (+) Dental Advisory Given   Pulmonary neg pulmonary ROS,    Pulmonary exam normal breath sounds clear to auscultation       Cardiovascular hypertension, Pt. on medications Normal cardiovascular exam+ dysrhythmias Atrial Fibrillation  Rhythm:Regular Rate:Normal     Neuro/Psych negative neurological ROS  negative psych ROS   GI/Hepatic negative GI ROS, Neg liver ROS,   Endo/Other  negative endocrine ROS  Renal/GU negative Renal ROS  negative genitourinary   Musculoskeletal negative musculoskeletal ROS (+)   Abdominal   Peds negative pediatric ROS (+)  Hematology negative hematology ROS (+)   Anesthesia Other Findings   Reproductive/Obstetrics negative OB ROS                             Anesthesia Physical Anesthesia Plan  ASA: III  Anesthesia Plan: MAC   Post-op Pain Management:    Induction: Intravenous  Airway Management Planned:   Additional Equipment:   Intra-op Plan:   Post-operative Plan:   Informed Consent: I have reviewed the patients History and Physical, chart, labs and discussed the procedure including the risks, benefits and alternatives for the proposed anesthesia with the patient or authorized representative who has indicated his/her understanding and acceptance.   Dental advisory given  Plan Discussed with: CRNA  Anesthesia Plan Comments:         Anesthesia Quick Evaluation

## 2015-10-14 NOTE — Anesthesia Postprocedure Evaluation (Signed)
Anesthesia Post Note  Patient: Yolanda Bell  Procedure(s) Performed: Procedure(s) (LRB): CARDIOVERSION (N/A)  Patient location during evaluation: PACU Anesthesia Type: MAC Level of consciousness: awake and alert Pain management: pain level controlled Vital Signs Assessment: post-procedure vital signs reviewed and stable Respiratory status: spontaneous breathing, nonlabored ventilation, respiratory function stable and patient connected to nasal cannula oxygen Cardiovascular status: blood pressure returned to baseline and stable Postop Assessment: no signs of nausea or vomiting Anesthetic complications: no    Last Vitals:  Filed Vitals:   10/14/15 1315 10/14/15 1320  BP:  101/58  Pulse: 52 54  Temp:    Resp: 13 17    Last Pain: There were no vitals filed for this visit.               Holten Spano JENNETTE

## 2015-10-14 NOTE — CV Procedure (Signed)
[facility]: Anesthesia:  Dr Jillyn Hidden propofol/lidocaine  Atrial fibrillation rate 133   [place] x1 120J  Converted to NSR rate 70  On Rx xarelto No immediate neurologic sequelae  Jenkins Rouge

## 2015-10-15 ENCOUNTER — Encounter (HOSPITAL_COMMUNITY): Payer: Self-pay | Admitting: Cardiovascular Disease

## 2015-10-15 ENCOUNTER — Telehealth: Payer: Self-pay | Admitting: Nurse Practitioner

## 2015-10-15 NOTE — Telephone Encounter (Signed)
I advised patient that I will discuss upcoming appointment with Dr. Macky Lower nurse, Trinidad Curet, RN and will call her back.  She verbalized understanding and agreement with plan.

## 2015-10-15 NOTE — Telephone Encounter (Signed)
Will you refer Yolanda Bell to either Dr. Rayann Heman or [facility] for consideration for ablation .    She has failed amiodarone in the past.   Called patient to discuss referral.  She states she is concerned about BP and pulse last night and today. She states this morning systolic BP ranges from [id number] mmHg and diastolic ranges from [id number] mmHg.  States pulse is 52-56 bpm today.  She asks if she should take her am dose of Metoprolol - states it was discussed yesterday with Dr. Johnsie Cancel after her DCCV and she was given IV fluids prior to leaving the hospital; states BP and pulse improved after fluids so she was advised to take Metoprolol 50 mg last night.  She is prescribed Toprol XL 50 mg BID.  I advised her to hold metoprolol this morning and continue to monitor vital signs.  I advised her to take metoprolol tonight unless BP is <90/60 and or pulse is <55

## 2015-10-15 NOTE — Telephone Encounter (Signed)
Spoke with Dr. Acie Fredrickson regarding patient's metoprolol dose and concerns about low heart rate and blood pressure.  He advised she take 1/2 tab (25 mg) twice daily.  She is scheduled to see Dr. Curt Bears on 1/11. She is scheduled to fly to [facility], [place] next weekend and wants to know if she can still travel.  I advised her that with her history of atrial fibrillation, she is aware of going in and out of sinus rhythm and takes appropriate medications to protect her.  She is aware she may go back into a fib and that her travel may be affected by symptoms of this.  I advised I will forward to Dr. Acie Fredrickson for advice.  She verbalized understanding and agreement.

## 2015-10-15 NOTE — Telephone Encounter (Signed)
Opened in error

## 2015-10-16 ENCOUNTER — Other Ambulatory Visit: Payer: Self-pay | Admitting: Cardiovascular Disease

## 2015-10-16 MED ORDER — METOPROLOL SUCCINATE ER 25 MG PO TB24: 25.0000 mg | ORAL_TABLET | Freq: Two times a day (BID) | ORAL | Status: DC

## 2015-10-22 ENCOUNTER — Ambulatory Visit (INDEPENDENT_AMBULATORY_CARE_PROVIDER_SITE_OTHER): Payer: Medicare Other | Admitting: Cardiology

## 2015-10-22 ENCOUNTER — Encounter: Payer: Self-pay | Admitting: Cardiology

## 2015-10-22 VITALS — BP 132/70 | HR 66 | Ht 64.0 in | Wt 173.0 lb

## 2015-10-22 DIAGNOSIS — I481 Persistent atrial fibrillation: Secondary | ICD-10-CM | POA: Diagnosis not present

## 2015-10-22 DIAGNOSIS — I4819 Other persistent atrial fibrillation: Secondary | ICD-10-CM

## 2015-10-22 NOTE — Progress Notes (Signed)
Electrophysiology Office Note   Date:  10/22/2015   ID:  Jamela, Janz 01-29-1941, MRN [id number]  PCP:  Gennette Pac, MD  Cardiologist:  Nah[place] Primary Electrophysiologist:  Adien Kimmel Meredith Leeds, MD    Chief Complaint  Patient presents with  . Atrial Fibrillation     History of Present Illness: Yolanda Bell is a 75 y.o. female who presents today for electrophysiology evaluation.   She has PMH of constrictive pericarditis s/p pericardial stripping at [facility] 2006, SVT s/p RFA by Dr. Caryl Comes, HTN, gout and afib/aflutter currently on Xarelto. She previously underwent DCCV in Feb 2014 however had recurrence since. She most recently underwent TEE DCCV on 06/10/2014. TEE on on that day showed EF 45-50%, mild to moderate TR, moderate MR. Patient has cardiac awareness of afib with chest pressure and palpitation. According to the patient, she woke up with atrial fibrillation symptom on 12/11, she took propranolol and converted after 5 hours. She again woke on Christmas morning around 4AM with palpitation, this time, she stayed in afib. She denies any chest pressure when she was in NSR, she says she only get chest pressure during afib.   Since cardioversion she is felt well without any major complaints. She is able to do her daily activities. She has taken amiodarone in the past and did not tolerate this medication due to side effects.  Today, she denies symptoms of palpitations, chest pain, shortness of breath, orthopnea, PND, lower extremity edema, claudication, dizziness, presyncope, syncope, bleeding, or neurologic sequela. The patient is tolerating medications without difficulties and is otherwise without complaint today.    Past Medical History  Diagnosis Date  . Constrictive pericarditis     2 status post pericardial stripping at [facility]  . Atrial fibrillation ([place])     Status post TEE cardioversion  . Spastic dysphonia   . Supraventricular tachycardia ([place])    Status post RF ablation by Dr. Peterson Lombard  . Hypertension   . Gout    Past Surgical History  Procedure Laterality Date  . Cardioversion  11/21/2012    Procedure: CARDIOVERSION;  Surgeon: Darlin Coco, MD;  Location: [facility] ENDOSCOPY;  Service: Cardiovascular;;  . Pericardiectomy    . Cholecystectomy      1989  . Abdominal hysterectomy      1987  . Tonsillectomy    . Eye surgery      cataracts  . Tee without cardioversion N/A 06/10/2014    Procedure: TRANSESOPHAGEAL ECHOCARDIOGRAM (TEE);  Surgeon: Thayer Headings, MD;  Location: [place];  Service: Cardiovascular;  Laterality: N/A;  . Cardioversion N/A 06/10/2014    Procedure: CARDIOVERSION;  Surgeon: Thayer Headings, MD;  Location: [place];  Service: Cardiovascular;  Laterality: N/A;  . Cardioversion N/A 10/14/2015    Procedure: CARDIOVERSION;  Surgeon: Josue Hector, MD;  Location: [facility] ENDOSCOPY;  Service: Cardiovascular;  Laterality: N/A;     Current Outpatient Prescriptions  Medication Sig Dispense Refill  . alendronate (FOSAMAX) 70 MG tablet Take 70 mg by mouth once a week. On Saturday or Sunday  4  . cholecalciferol (VITAMIN D) 1000 units tablet Take 1,000 Units by mouth daily.    . Glucosamine-Chondroit-Vit C-Mn (GLUCOSAMINE-CHONDROITIN MAX ST PO) Take 1 tablet by mouth 2 (two) times daily.    . indomethacin (INDOCIN) 25 MG capsule Take 25 mg by mouth 2 (two) times daily as needed (gout).     Marland Kitchen KLOR-CON M10 10 MEQ tablet TAKE 1 TABLET (10 MEQ TOTAL) BY MOUTH  DAILY. 90 tablet 3  . metoprolol succinate (TOPROL-XL) 50 MG 24 hr tablet Take 25 mg by mouth 2 (two) times daily.  3  . rivaroxaban (XARELTO) 20 MG TABS tablet TAKE 1 TABLET BY MOUTH EVERY DAY WITH SUPPER (Patient taking differently: Take 20 mg by mouth daily with supper. ) 90 tablet 2  . spironolactone (ALDACTONE) 50 MG tablet Take 50 mg by mouth daily.    Marland Kitchen torsemide (DEMADEX) 20 MG tablet TAKE 1 TABLET (20 MG TOTAL) BY MOUTH DAILY. 90 tablet 3   No current  facility-administered medications for this visit.    Allergies:   Amiodarone; Other; and Azithromycin   Social History:  The patient  reports that she has never smoked. She has never used smokeless tobacco. She reports that she drinks alcohol. She reports that she does not use illicit drugs.   Family History:  The patient's family history includes Breast cancer in her sister; Emphysema in her father; Heart disease in her mother.    ROS:  Please see the history of present illness.   Otherwise, review of systems is positive for cough, palpitations.   All other systems are reviewed and negative.    PHYSICAL EXAM: VS:  BP 132/70 mmHg  Pulse 66  Ht 5\' 4"  (1.626 m)  Wt 173 lb (78.472 kg)  BMI 29.68 kg/m2 , BMI Body mass index is 29.68 kg/(m^2). GEN: Well nourished, well developed, in no acute distress HEENT: normal Neck: no JVD, carotid bruits, or masses Cardiac: RRR; no murmurs, rubs, or gallops,no edema  Respiratory:  clear to auscultation bilaterally, normal work of breathing GI: soft, nontender, nondistended, + BS MS: no deformity or atrophy Skin: warm and dry Neuro:  Strength and sensation are intact Psych: euthymic mood, full affect  EKG:  EKG is not ordered today. The ekg ordered today shows sinus rhythm, rate 69   Recent Labs: 08/18/2015: ALT 15 10/08/2015: BUN 23; Creat 0.95*; Hemoglobin 14.5; Platelets 251; Potassium 4.2; Sodium 141; TSH 1.274    Lipid Panel     Component Value Date/Time   CHOL 158 08/18/2015 1147   TRIG 83 08/18/2015 1147   HDL 47 08/18/2015 1147   CHOLHDL 3.4 08/18/2015 1147   VLDL 17 08/18/2015 1147   LDLCALC 94 08/18/2015 1147     Wt Readings from Last 3 Encounters:  10/22/15 173 lb (78.472 kg)  10/14/15 173 lb (78.472 kg)  10/08/15 174 lb 12.8 oz (79.289 kg)      Other studies Reviewed: Additional studies/ records that were reviewed today include: TEE/cardioversion Review of the above records today demonstrates:  - Left ventricle:  Systolic function was mildly reduced. The estimated ejection fraction was in the range of 45% to 50%. No evidence of thrombus. - Mitral valve: There was moderate regurgitation. - Left atrium: No evidence of thrombus in the atrial cavity or appendage. - Tricuspid valve: There was mild-moderate regurgitation.  Impressions:  - Successful cardioversion. No cardiac source of emboli was indentified.  ASSESSMENT AND PLAN:  1.  Persistent atrial fibrillation: Is currently interested in ablation. I have discussed the risks and benefits with her as well as the risks and benefits of dofetilide.  At this point, she would prefer ablation for management of her AF.  I discussed the risks and benefits of ablation today. Risks include bleeding, infection, heart block, stroke, and damage to surrounding organs. She understands these risks and agrees to ablation. We Elsy Chiang call her back with a time schedule.  2. constrictive pericarditis s/p  pericardial stripping at [facility] 2006 3. h/o SVT s/p RFA by Dr. Caryl Comes 4. HTN: increase metoprolol XL to BID Exam his weight has been   Current medicines are reviewed at length with the patient today.   The patient does not have concerns regarding her medicines.  The following changes were made today:  none  Labs/ tests ordered today include:  No orders of the defined types were placed in this encounter.     Disposition:   FU with Aubreana Cornacchia 3 months  Signed, Thomasena Vandenheuvel Meredith Leeds, MD  10/22/2015 1:39 PM     [address] [place] [address] [phone] (office) [phone] (fax)

## 2015-10-22 NOTE — Patient Instructions (Signed)
Medication Instructions:  Your physician recommends that you continue on your current medications as directed. Please refer to the Current Medication list given to you today.  Labwork: None ordered  Testing/Procedures: Your physician has recommended that you have an ablation. Catheter ablation is a medical procedure used to treat some cardiac arrhythmias (irregular heartbeats). During catheter ablation, a long, thin, flexible tube is put into a blood vessel in your groin (upper thigh), or neck. This tube is called an ablation catheter. It is then guided to your heart through the blood vessel. Radio frequency waves destroy small areas of heart tissue where abnormal heartbeats may cause an arrhythmia to start.   Conchetta Lamia, RN will call you next month to schedule this procedure.    Follow-Up: To be determined once ablation is scheduled.  If you need a refill on your cardiac medications before your next appointment, please call your pharmacy.  Thank you for choosing [facility]!!   Trinidad Curet, RN [phone]

## 2015-10-29 ENCOUNTER — Ambulatory Visit: Payer: Medicare Other | Admitting: Cardiovascular Disease

## 2015-11-14 ENCOUNTER — Telehealth: Payer: Self-pay | Admitting: *Deleted

## 2015-11-14 NOTE — Telephone Encounter (Signed)
Called patient to schedule AFib ablation or arrange starting Tikosyn. Patient tells me that she does not want either at this point. She spent 20 minutes explaining her reasoning and discussing research she has done so far. States that she felt disappointed with Dr. Curt Bears and felt that he didn't know her medical hx like he should. States that anesthesia is a concern for her secondary to liver issues. States that she has been lucky to have previous DCCV and last a yr/two before afib reappearance.  She understands that it may not last as long or that symptoms may worsen.  She would like to discuss with Dr. Acie Fredrickson more. Informed patient that I would send message to Dr. Acie Fredrickson and his nurse, Sharyn Lull, to address seeing patient sooner that May recall. She understands that one of them will contact her w/ recommendations/appt. She thanks me for talking with her, being so nice and patient.

## 2015-11-19 NOTE — Telephone Encounter (Signed)
Left message for patient to call me at the office 

## 2015-11-20 NOTE — Telephone Encounter (Signed)
Dr. Acie Fredrickson called and talked with patient on 2/8.  She will call for sooner follow-up if she chooses; she is currently scheduled for follow-up in May.

## 2016-01-14 ENCOUNTER — Encounter: Payer: Self-pay | Admitting: Cardiovascular Disease

## 2016-02-24 ENCOUNTER — Ambulatory Visit: Payer: Medicare Other | Admitting: Cardiovascular Disease

## 2016-02-26 ENCOUNTER — Ambulatory Visit (INDEPENDENT_AMBULATORY_CARE_PROVIDER_SITE_OTHER): Payer: Medicare Other | Admitting: Cardiovascular Disease

## 2016-02-26 ENCOUNTER — Encounter: Payer: Self-pay | Admitting: Cardiovascular Disease

## 2016-02-26 VITALS — BP 132/78 | HR 60 | Ht 64.0 in | Wt 171.0 lb

## 2016-02-26 DIAGNOSIS — I10 Essential (primary) hypertension: Secondary | ICD-10-CM | POA: Diagnosis not present

## 2016-02-26 DIAGNOSIS — I481 Persistent atrial fibrillation: Secondary | ICD-10-CM

## 2016-02-26 DIAGNOSIS — I311 Chronic constrictive pericarditis: Secondary | ICD-10-CM

## 2016-02-26 DIAGNOSIS — I4819 Other persistent atrial fibrillation: Secondary | ICD-10-CM

## 2016-02-26 NOTE — Patient Instructions (Addendum)
Medication Instructions:  Your physician recommends that you continue on your current medications as directed. Please refer to the Current Medication list given to you today.   Labwork: None ordererd  Testing/Procedures: None ordered  Follow-Up: Your physician wants you to follow-up in: 6 months with Dr. Acie Fredrickson. You will receive a reminder letter in the mail two months in advance. If you don't receive a letter, please call our office to schedule the follow-up appointment.   Any Other Special Instructions Will Be Listed Below (If Applicable).    If you need a refill on your cardiac medications before your next appointment, please call your pharmacy.

## 2016-02-26 NOTE — Progress Notes (Signed)
Cardiology Office Note   Date:  02/26/2016   ID:  Yolanda Bell, Yolanda Bell 1941/08/25, MRN [id number]  PCP:  Gennette Pac, MD  Cardiologist:   Mertie Moores, MD   Chief Complaint  Patient presents with  . Hypertension  . Atrial Fibrillation   1. Constrictive Pericarditis 2. A-Flutter / Fibrillation 3. Gout 4. Spastic dysphonia 5. SVT 6. HTN  History of Present Illness:  She has done well from a cardiac standpoint. She has had a flare up of gout which has affected both feet. She has been taking indocin for the flare ups.   She had a bad accident in February. She was getting off an airplane and tripped on the off ramp in [place]. She broke both shoulders, left thumb and had soft tissue injury to her left foot. She was treated conservatively (bilateral arm slings). Her left thumb fracture was not found until she returned to [place]. She had some tachypalpitations for 22 hours during her recovery. Her HR was 130-140 for 22 hours and resolved spontaneously. I suspect she had a brief episode of atrial flutter.  She was quite emotional when she was telling me the issues of her fall. It is apparently still is quite upsetting to her.  October 19, 2012: She presents today for further evaluation of atrial fib / flutter that was found at cardiac rehab. She really cannot tell that her HR is fast and irregular. She can feel her HR when she is lying down. She is asymptomatic when she is up and busy doing chores. She denies any chest pain or dyspnea.   Feb. 4, 2014: Yolanda Bell seems to have rapid atrial fibrillation. [name] on relatively high dose of metoprolol XL and also is on when necessary propranolol. He is tolerating the atrial ablation but certainly does not feel as well as she would like to. [name] here to schedule her cardioversion next week. She denies any angina. She does have some shortness of breath.  January 01, 2013:  She has maintained NSR since her  cardioversion ( Feb. 11, 2014) . She was quite frustrated with her recent Out patient area / endoscopy department cardioversion. Lots of computer issues / scheduling issues.   She has done well from a cardiac standpoint.   Sept. 22, 2014:  She has done well. She had some redness and tenderness just below her right eye. She is not having any cardiac problems.   January 14, 2014: She has done well. She had a brief episode of tachycardia that lasted for several days. Sounds like an episode of SVT. She tried some propranolol and eventually resolved after about 5-6 hours.   June 07, 2014:  [name] presents with several weeks of palpitations. She came in today and was noted to be in atrial fibrillation. She started back on her xarelto 4 days ago. Her HR is well controlled  Oct. 5, 2015:  Yolanda Bell had a cardioversion August 31. She had a brief episode of irregular Hr - she took some propraoolol. . She thinks that she eventually converted back to NSR. In looking back , these were probably PVCs.   May , 10 , 2016;   DARCELLE Bell is a 75 y.o. female who presents for follow up of her atrial fib She was cardioverted last august.   She was found to have a few RBCs in a UA - no obvious blood   Nov. 7, 2016:  Doing well from a cardiac standpoint.  No CP , no dyspnea.  Feb 26, 2016: Feeling well  Needs laser surgery on her right eye.  Doing well.   Past Medical History  Diagnosis Date  . Constrictive pericarditis     2 status post pericardial stripping at the [facility]  . Atrial fibrillation ([place])     Status post TEE cardioversion  . Spastic dysphonia   . Supraventricular tachycardia ([place])     Status post RF ablation by Dr. Peterson Lombard  . Hypertension   . Gout     Past Surgical History  Procedure Laterality Date  . Cardioversion  11/21/2012    Procedure: CARDIOVERSION;  Surgeon: Darlin Coco, MD;  Location: [facility] END[facility]COPY;  Service: Cardiovascular;;  .  Pericardiectomy    . Cholecystectomy      1989  . Abdominal hysterectomy      1987  . Tonsillectomy    . Eye surgery      cataracts  . Tee without cardioversion N/A 06/10/2014    Procedure: TRANSESOPHAGEAL ECHOCARDIOGRAM (TEE);  Surgeon: Thayer Headings, MD;  Location: [place];  Service: Cardiovascular;  Laterality: N/A;  . Cardioversion N/A 06/10/2014    Procedure: CARDIOVERSION;  Surgeon: Thayer Headings, MD;  Location: [place];  Service: Cardiovascular;  Laterality: N/A;  . Cardioversion N/A 10/14/2015    Procedure: CARDIOVERSION;  Surgeon: Josue Hector, MD;  Location[facility] END[facility]COPY;  Service: Cardiovascular;  Laterality: N/A;     Current Outpatient Prescriptions  Medication Sig Dispense Refill  . alendronate (FOSAMAX) 70 MG tablet Take 70 mg by mouth once a week. On Saturday or Sunday  4  . celecoxib (CELEBREX) 200 MG capsule Take 200 mg by mouth daily as needed. As needed for gout flare ups  1  . cholecalciferol (VITAMIN D) 1000 units tablet Take 1,000 Units by mouth daily.    . Glucosamine-Chondroit-Vit C-Mn (GLUCOSAMINE-CHONDROITIN MAX ST PO) Take 1 tablet by mouth 2 (two) times daily.    Marland Kitchen KLOR-CON M10 10 MEQ tablet TAKE 1 TABLET (10 MEQ TOTAL) BY MOUTH DAILY. 90 tablet 3  . metoprolol succinate (TOPROL-XL) 50 MG 24 hr tablet Take 25 mg by mouth 2 (two) times daily.  3  . rivaroxaban (XARELTO) 20 MG TABS tablet TAKE 1 TABLET BY MOUTH EVERY DAY WITH SUPPER (Patient taking differently: Take 20 mg by mouth daily with supper. ) 90 tablet 2  . spironolactone (ALDACTONE) 50 MG tablet Take 50 mg by mouth daily.    Marland Kitchen torsemide (DEMADEX) 20 MG tablet TAKE 1 TABLET (20 MG TOTAL) BY MOUTH DAILY. 90 tablet 3  . allopurinol (ZYLOPRIM) 300 MG tablet Take 300 mg by mouth daily. Reported on 02/26/2016  3   No current facility-administered medications for this visit.    Allergies:   Amiodarone; Other; and Azithromycin    Social History:  The patient  reports that she has never  smoked. She has never used smokeless tobacco. She reports that she drinks alcohol. She reports that she does not use illicit drugs.   Family History:  The patient's family history includes Breast cancer in her sister; Emphysema in her father; Heart disease in her mother.    ROS:  Please see the history of present illness.    Review of Systems: Constitutional:  denies fever, chills, diaphoresis, appetite change and fatigue.  HEENT: denies photophobia, eye pain, redness, hearing loss, ear pain, congestion, sore throat, rhinorrhea, sneezing, neck pain, neck stiffness and tinnitus.  Respiratory: denies SOB, DOE, cough, chest tightness, and wheezing.  Cardiovascular: denies chest pain, palpitations and  leg swelling.  Gastrointestinal: denies nausea, vomiting, abdominal pain, diarrhea, constipation, blood in stool.  Genitourinary: denies dysuria, urgency, frequency, hematuria, flank pain and difficulty urinating.  Musculoskeletal: denies  myalgias, back pain, joint swelling, arthralgias and gait problem.   Skin: denies pallor, rash and wound.  Neurological: denies dizziness, seizures, syncope, weakness, light-headedness, numbness and headaches.   Hematological: denies adenopathy, easy bruising, personal or family bleeding history.  Psychiatric/ Behavioral: denies suicidal ideation, mood changes, confusion, nervousness, sleep disturbance and agitation.       All other systems are reviewed and negative.    PHYSICAL EXAM: VS:  BP 132/78 mmHg  Pulse 60  Ht 5\' 4"  (1.626 m)  Wt 171 lb (77.565 kg)  BMI 29.34 kg/m2 , BMI Body mass index is 29.34 kg/(m^2). GEN: Well nourished, well developed, in no acute distress HEENT: normal Neck: no JVD, carotid bruits, or masses Cardiac: RRR; no murmurs, rubs, or gallops,no edema  Respiratory:  clear to auscultation bilaterally, normal work of breathing GI: soft, nontender, nondistended, + BS MS: no deformity or atrophy Skin: warm and dry, no  rash Neuro:  Strength and sensation are intact Psych: normal   EKG:  EKG is ordered today. The ekg ordered today demonstrates   NSR at 75. NS ST abn.    Recent Labs: 08/18/2015: ALT 15 10/08/2015: BUN 23; Creat 0.95*; Hemoglobin 14.5; Platelets 251; Potassium 4.2; Sodium 141; TSH 1.274    Lipid Panel    Component Value Date/Time   CHOL 158 08/18/2015 1147   TRIG 83 08/18/2015 1147   HDL 47 08/18/2015 1147   CHOLHDL 3.4 08/18/2015 1147   VLDL 17 08/18/2015 1147   LDLCALC 94 08/18/2015 1147      Wt Readings from Last 3 Encounters:  02/26/16 171 lb (77.565 kg)  10/22/15 173 lb (78.472 kg)  10/14/15 173 lb (78.472 kg)      Other studies Reviewed: Additional studies/ records that were reviewed today include: . Review of the above records demonstrates:    ASSESSMENT AND PLAN:  1. Constrictive Pericarditis - status post pericardectomy. 2. A-Flutter / Fibrillation - continue current dose of Xarelto. She has maintained normal sinus rhythm. [name] going to need laser surgery on her right eye. She is at low risk for her upcoming surgery. She may stop her Xarelto for 3 days prior to surgery and then resume the Xarelto the next night.  3. Gout-  Going to start allopurinol   4.Spastic dysphonia 5. SVT 6. HTN - blood pressures well-controlled.   Current medicines are reviewed at length with the patient today.  The patient does not have concerns regarding medicines.  The following changes have been made:  no change  Labs/ tests ordered today include:  No orders of the defined types were placed in this encounter.     Disposition:   FU with me in 6 months      Mertie Moores, MD  02/26/2016 9:16 AM    [facility] [place], [place], [place]  [address] Phone: [phone]; Fax: [phone]   [facility]  [address] [place], [place]  [address] [phone]    Fax [phone]

## 2016-04-05 ENCOUNTER — Other Ambulatory Visit: Payer: Self-pay | Admitting: *Deleted

## 2016-04-05 MED ORDER — METOPROLOL SUCCINATE ER 50 MG PO TB24: ORAL_TABLET | ORAL | Status: DC

## 2016-04-05 NOTE — Telephone Encounter (Signed)
metoprolol succinate (TOPROL-XL) 50 MG 24 hr tablet  Medication   Ordering: Paulla Dolly  Authorizing: Historical Provider, MD   Date: 10/22/2015  Department: [place] Office      Order Providers    Documenting Provider Encounter Provider   Historical Provider, MD [name], MD    Medication Detail      Disp Refills Start End     metoprolol succinate (TOPROL-XL) 50 MG 24 hr tablet  3 10/08/2015     Sig - Route: Take 25 mg by mouth 2 (two) times daily. - Oral    Class: Historical Med     Medication Notes       Tharon Aquas Feb 26, 2016 9:26 AM   [facility], Honor Junes Oct 22, 2015 10:20 AM (EST) Received from: External Pharmacy Received Sig: TAKE 1 TABLET BY MOUTH TWICE A DAY TAKE WITH OR IMMEDIATELY FOLLOWING A MEAL

## 2016-04-06 ENCOUNTER — Telehealth: Payer: Self-pay | Admitting: *Deleted

## 2016-04-06 NOTE — Telephone Encounter (Signed)
Patient xalled to verify if we sent her RX in [facility] [address] [place].

## 2016-04-14 ENCOUNTER — Other Ambulatory Visit: Payer: Self-pay | Admitting: Cardiovascular Disease

## 2016-04-14 MED ORDER — POTASSIUM CHLORIDE CRYS ER 10 MEQ PO TBCR: EXTENDED_RELEASE_TABLET | ORAL | Status: DC

## 2016-04-14 NOTE — Telephone Encounter (Signed)
°*  STAT* If patient is at the pharmacy, call can be transferred to refill team.   1. Which medications need to be refilled? (please list name of each medication and dose if known) Klor Con  2. Which pharmacy/location (including street and city if local pharmacy) is medication to be sent to?[phone]  3. Do they need a 30 day or 90 day supply? 90 and refills

## 2016-05-07 ENCOUNTER — Telehealth: Payer: Self-pay | Admitting: Cardiovascular Disease

## 2016-05-07 MED ORDER — METOPROLOL SUCCINATE ER 25 MG PO TB24: 75.0000 mg | ORAL_TABLET | Freq: Every day | ORAL | Status: DC

## 2016-05-07 NOTE — Telephone Encounter (Signed)
Received call directly from operator.  Patient states she noticed increased SOB yesterday after walking to the mailbox.  States HR was irregular and fast last night so she took an extra 1/2 Metoprolol (25 mg) and went back to bed.  She states she slept well but when she woke up HR was 130 bpm and BP 103/68 mmHg.  She took 50 mg metoprolol and went back to bed at 0730.  She states current BP is 94/66, HR 112 bpm.  She continues to feel a little fatigued and SOB.  She states her propranolol Rx is outdated.  I advised her to take an additional 25 mg metoprolol now with lunch and to drink plenty of water.  She states she drinks 9-10 glasses of water daily due to taking allopurinol and will make sure she continues to stay well hydrated today.  States now BP is 112/78 mmHg, pulse 133 bpm but is not reading irregular.  She agrees to take 25 mg metoprolol now and will call back in 1 hour to report vital signs. She thanked me for my help.

## 2016-05-07 NOTE — Telephone Encounter (Signed)
Patient called back to report how she is feeling since taking additional 25 mg of metoprolol.  She reports: BP 133/87 mmHg, HR 136 bpm BP 103/74 mmHg, 137 bpm Denies dizziness, light-headedness, syncopal feeling.  States she does not feel bad at all.  I advised that I am glad she is not feeling poor, however the elevated heart rate for a long period of time will cause her problems.  I advised that I will discuss with Dr. Lovena Le, DOD, and call her back with his advice.  She verbalized understanding and agreement.

## 2016-05-07 NOTE — Telephone Encounter (Signed)
New message     Patient c/o Palpitations:  High priority if patient c/o lightheadedness and shortness of breath.  1. How long have you been having palpitations? Since last night  2. Are you currently experiencing lightheadedness and shortness of breath? Yes, yesterday afternoon  3. Have you checked your BP and heart rate? (document readings) 112 to 135 heart rate, BP from 135 and 112 top, 81 to 66 bottom.   4. Are you experiencing any other symptoms? no

## 2016-05-07 NOTE — Telephone Encounter (Signed)
Reviewed with Dr. Lovena Le who advised patient increase metoprolol to 75 mg twice daily and be scheduled in A Fib clinic or with Dr. Curt Bears next week.  He advised she go to the ER for chest pain, SOB, or syncope.   I scheduled patient with Roderic Palau, NP at [facility] for Tuesday August 1 at 0900 due to no appointments with Dr. Curt Bears available.  I reviewed Dr. Tanna Furry advice with patient who verbalized understanding and agreement with plan.  She states she still has no adverse symptoms and will continue to monitor heart rate and BP.  She has parking code for [facility] entrance and is aware she can call back to the office with questions or concerns.  She thanked me for my help.

## 2016-05-11 ENCOUNTER — Encounter (HOSPITAL_COMMUNITY): Payer: Self-pay | Admitting: Nurse Practitioner

## 2016-05-11 ENCOUNTER — Ambulatory Visit (HOSPITAL_COMMUNITY)
Admission: RE | Admit: 2016-05-11 | Discharge: 2016-05-11 | Disposition: A | Discharge status: Home / Self Care | Payer: Medicare Other | Source: Ambulatory Visit | Attending: Nurse Practitioner | Admitting: Nurse Practitioner

## 2016-05-11 VITALS — BP 136/82 | HR 115 | Ht 64.0 in | Wt 170.0 lb

## 2016-05-11 DIAGNOSIS — I10 Essential (primary) hypertension: Secondary | ICD-10-CM | POA: Diagnosis not present

## 2016-05-11 DIAGNOSIS — Z79899 Other long term (current) drug therapy: Secondary | ICD-10-CM | POA: Diagnosis not present

## 2016-05-11 DIAGNOSIS — I480 Paroxysmal atrial fibrillation: Secondary | ICD-10-CM | POA: Diagnosis present

## 2016-05-11 DIAGNOSIS — Z7901 Long term (current) use of anticoagulants: Secondary | ICD-10-CM | POA: Diagnosis not present

## 2016-05-11 DIAGNOSIS — M109 Gout, unspecified: Secondary | ICD-10-CM | POA: Insufficient documentation

## 2016-05-11 DIAGNOSIS — Z888 Allergy status to other drugs, medicaments and biological substances status: Secondary | ICD-10-CM | POA: Insufficient documentation

## 2016-05-11 DIAGNOSIS — I4819 Other persistent atrial fibrillation: Secondary | ICD-10-CM

## 2016-05-11 DIAGNOSIS — I481 Persistent atrial fibrillation: Secondary | ICD-10-CM

## 2016-05-11 DIAGNOSIS — Z8249 Family history of ischemic heart disease and other diseases of the circulatory system: Secondary | ICD-10-CM | POA: Diagnosis not present

## 2016-05-11 DIAGNOSIS — Z7983 Long term (current) use of bisphosphonates: Secondary | ICD-10-CM | POA: Insufficient documentation

## 2016-05-11 LAB — COMPREHENSIVE METABOLIC PANEL
ALT: 14 U/L (ref 14–54)
ANION GAP: 6 (ref 5–15)
AST: 17 U/L (ref 15–41)
Albumin: 4.4 g/dL (ref 3.5–5.0)
Alkaline Phosphatase: 70 U/L (ref 38–126)
BUN: 20 mg/dL (ref 6–20)
CHLORIDE: 104 mmol/L (ref 101–111)
CO2: 29 mmol/L (ref 22–32)
Calcium: 9.6 mg/dL (ref 8.9–10.3)
Creatinine, Ser: 1.04 mg/dL — ABNORMAL HIGH (ref 0.44–1.00)
GFR, EST NON AFRICAN AMERICAN: 52 mL/min — AB (ref >60)
Glucose, Bld: 105 mg/dL — ABNORMAL HIGH (ref 65–99)
POTASSIUM: 3.9 mmol/L (ref 3.5–5.1)
SODIUM: 139 mmol/L (ref 135–145)
Total Bilirubin: 0.8 mg/dL (ref 0.3–1.2)
Total Protein: 6.9 g/dL (ref 6.5–8.1)

## 2016-05-11 LAB — CBC
HCT: 42.2 % (ref 36.0–46.0)
Hemoglobin: 14 g/dL (ref 12.0–15.0)
MCH: 28.6 pg (ref 26.0–34.0)
MCHC: 33.2 g/dL (ref 30.0–36.0)
MCV: 86.1 fL (ref 78.0–100.0)
Platelets: 217 10*3/uL (ref 150–400)
RBC: 4.9 MIL/uL (ref 3.87–5.11)
RDW: 14 % (ref 11.5–15.5)
WBC: 5.6 10*3/uL (ref 4.0–10.5)

## 2016-05-11 LAB — TSH: TSH: 3.188 u[IU]/mL (ref 0.350–4.500)

## 2016-05-11 LAB — MAGNESIUM: MAGNESIUM: 1.8 mg/dL (ref 1.7–2.4)

## 2016-05-11 NOTE — Progress Notes (Signed)
Patient ID: Yolanda Bell, female   DOB: May 11, 1941, 75 y.o.   MRN: [id number]     Primary Care Physician: Yolanda Pac, MD Referring Physician: Rex Bell triage Cardiologist: Dr. Acie Bell EP: Dr. Hurshel Party Yolanda Bell is a 75 y.o. female with a h/o constrictive pericarditis with pericardial stripping at the [facility] in [place] in 2006, SVT ablation by Dr. Caryl Bell, remote past,, gout  Afib/flutter, with 5-6 cardioversion's over the years, last DCCV in January 2017. Metoprolol was increased to 75 mg bid end of last week and pt has felt more comfortable with less chaotic beating of the heart. However, she still has fatigue and shortness of breath. She feels that she may have gone back in SR briefly Saturday but requires cardioversion to return her to [place]. She is going out of town this weekend, as well as next weekend. Last complete echo in 2014 and last TEE in 2015 at time of a cardioversion, showed mod MR/mod TR. EF 45-50%. She tried amiodarone and did not tolerate at time of her pericardial stripping.She saw Dr. Curt Bell in January this year, after last cardioversion, to discuss ablation but pt hoped that cardioversion would hold her in rhythm and she wanted to delay ablation at long as possible. No missed doses of xarelto for chadsvasc score of at least 3.  Today, she denies symptoms of chest pain,  orthopnea, PND, lower extremity edema, dizziness, presyncope, syncope, or neurologic sequela. Positive for shortness of breath, palpitations. The patient is tolerating medications without difficulties and is otherwise without complaint today.   Past Medical History:  Diagnosis Date  . Atrial fibrillation ([place])    Status post TEE cardioversion  . Constrictive pericarditis    2 status post pericardial stripping at the [facility]  . Gout   . Hypertension   . Spastic dysphonia   . Supraventricular tachycardia ([place])    Status post RF ablation by Dr. Peterson Bell   Past Surgical History:  Procedure  Laterality Date  . ABDOMINAL HYSTERECTOMY     1987  . CARDIOVERSION  11/21/2012   Procedure: CARDIOVERSION;  Surgeon: Yolanda Coco, MD;  Location: [place];  Service: Cardiovascular;;  . CARDIOVERSION N/A 06/10/2014   Procedure: CARDIOVERSION;  Surgeon: Yolanda Headings, MD;  Location: [facility] ENDOSCOPY;  Service: Cardiovascular;  Laterality: N/A;  . CARDIOVERSION N/A 10/14/2015   Procedure: CARDIOVERSION;  Surgeon: Yolanda Hector, MD;  Location: [place];  Service: Cardiovascular;  Laterality: N/A;  . CHOLECYSTECTOMY     1989  . EYE SURGERY     cataracts  . PERICARDIECTOMY    . TEE WITHOUT CARDIOVERSION N/A 06/10/2014   Procedure: TRANSESOPHAGEAL ECHOCARDIOGRAM (TEE);  Surgeon: Yolanda Headings, MD;  Location: [place];  Service: Cardiovascular;  Laterality: N/A;  . TONSILLECTOMY      Current Outpatient Prescriptions  Medication Sig Dispense Refill  . alendronate (FOSAMAX) 70 MG tablet Take 70 mg by mouth once a week. On Saturday or Sunday  4  . allopurinol (ZYLOPRIM) 300 MG tablet Take 300 mg by mouth daily. Reported on 02/26/2016  3  . cholecalciferol (VITAMIN D) 1000 units tablet Take 1,000 Units by mouth daily.    . indomethacin (INDOCIN) 25 MG capsule Take 25 mg by mouth 2 (two) times daily with a meal.    . metoprolol succinate (TOPROL-XL) 25 MG 24 hr tablet Take 3 tablets (75 mg total) by mouth daily. (Patient taking differently: Take 75 mg by mouth 2 (two) times daily. )    .  potassium chloride (KLOR-CON M10) 10 MEQ tablet TAKE 1 TABLET (10 MEQ TOTAL) BY MOUTH DAILY. 90 tablet 3  . rivaroxaban (XARELTO) 20 MG TABS tablet Take 20 mg by mouth daily with supper.    Marland Kitchen spironolactone (ALDACTONE) 50 MG tablet Take 50 mg by mouth daily.    Marland Kitchen torsemide (DEMADEX) 20 MG tablet TAKE 1 TABLET (20 MG TOTAL) BY MOUTH DAILY. 90 tablet 3  . celecoxib (CELEBREX) 200 MG capsule Take 200 mg by mouth daily as needed. As needed for gout flare ups  1  . Glucosamine-Chondroit-Vit C-Mn  (GLUCOSAMINE-CHONDROITIN MAX ST PO) Take 1 tablet by mouth 2 (two) times daily.     No current facility-administered medications for this encounter.     Allergies  Allergen Reactions  . Amiodarone Other (See Comments)    Dizziness, halucinations  . Other Other (See Comments)    Cannot take STEROIDS-unknown reaction-caused by internal scarring on liver.    . Azithromycin Nausea And Vomiting and Rash    Social History   Social History  . Marital status: Divorced    Spouse name: N/A  . Number of children: N/A  . Years of education: N/A   Occupational History  . Not on file.   Social History Main Topics  . Smoking status: Never Smoker  . Smokeless tobacco: Never Used  . Alcohol use Yes     Comment: occasionally  . Drug use: No  . Sexual activity: Not on file   Other Topics Concern  . Not on file   Social History Narrative  . No narrative on file    Family History  Problem Relation Age of Onset  . Heart disease Mother   . Emphysema Father   . Breast cancer Sister     ROS- All systems are reviewed and negative except as per the HPI above  Physical Exam: Vitals:   05/11/16 0902  BP: 136/82  Pulse: (!) 115  Weight: 170 lb (77.1 kg)  Height: 5\' 4"  (1.626 m)    GEN- The patient is well appearing, alert and oriented x 3 today.   Head- normocephalic, atraumatic Eyes-  Sclera clear, conjunctiva pink Ears- hearing intact Oropharynx- clear Neck- supple, no JVP Lymph- no cervical lymphadenopathy Lungs- Clear to ausculation bilaterally, normal work of breathing Heart- Regular rate and rhythm, no murmurs, rubs or gallops, PMI not laterally displaced GI- soft, NT, ND, + BS Extremities- no clubbing, cyanosis, or edema MS- no significant deformity or atrophy Skin- no rash or lesion Psych- euthymic mood, full affect Neuro- strength and sensation are intact  EKG- afib with RVR, v rate 115 bpm, qrs int 88 ms, qtc 456 ms Epic records reviewed Echo 2014-- Left  ventricle: The cavity size was normal. Wall thickness was increased in a pattern of mild LVH. Systolic function was normal. The estimated ejection fraction was in the range of 50% to 55%. Wall motion was normal; there were no regional wall motion abnormalities. The study is not technically sufficient to allow evaluation of LV diastolic function. - Mitral valve: Calcified annulus. Moderate regurgitation. - Left atrium: The atrium was mildly dilated. Impressions:  - Pericardium not well visualized; consider CT to further assess if clinically indicated and concern for constrictive pericarditis high.  Left atrium size 44 mm  Assessment and Plan: 1. PAF Persistent x 36+ hours Failed amiodarone in the past Discussed ablation with Dr. Curt Bell in January but wanted to wait Usually requires DCCV to restore SR NO MISSED DOSES OF [place] for  dccv tomorrow Continue metoprolol 75 mg bid, return to usual dose of metoprolol tomorrow am  Cbc, cmet, mag tsh today Echo early next week for further evaluation of valvular insufficiency, if has worsened, then possibly valve repair/maze may be worth considering  F/u with Dr. Curt Bell 7-10 days after cardioversion  Butch Penny C. Fernie Grimm, [place] Bell [address] [place], [place] [address] [phone]

## 2016-05-11 NOTE — Patient Instructions (Signed)
Cardioversion scheduled for Wednesday, August 2nd   - Arrive at the [facility] and go to admitting at 1230PM  -Do not eat or drink anything after midnight the night prior to your procedure.  - Take all your medication with a sip of water prior to arrival.  - You will not be able to drive home after your procedure.  Follow up with Dr. Curt Bears in 7-10 days -- Scheduler will be in touch with you to schedule this appointment.

## 2016-05-12 ENCOUNTER — Ambulatory Visit (HOSPITAL_COMMUNITY): Payer: Medicare Other | Admitting: Anesthesiology

## 2016-05-12 ENCOUNTER — Encounter (HOSPITAL_COMMUNITY): Payer: Self-pay | Admitting: Cardiovascular Disease

## 2016-05-12 ENCOUNTER — Ambulatory Visit (HOSPITAL_COMMUNITY)
Admission: RE | Admit: 2016-05-12 | Discharge: 2016-05-12 | Disposition: A | Discharge status: Home / Self Care | Payer: Medicare Other | Source: Ambulatory Visit | Attending: Cardiovascular Disease | Admitting: Cardiovascular Disease

## 2016-05-12 ENCOUNTER — Encounter (HOSPITAL_COMMUNITY)
Admission: RE | Disposition: A | Discharge status: Home / Self Care | Payer: Self-pay | Source: Ambulatory Visit | Attending: Cardiovascular Disease

## 2016-05-12 DIAGNOSIS — I10 Essential (primary) hypertension: Secondary | ICD-10-CM | POA: Insufficient documentation

## 2016-05-12 DIAGNOSIS — I480 Paroxysmal atrial fibrillation: Secondary | ICD-10-CM | POA: Diagnosis not present

## 2016-05-12 DIAGNOSIS — I4891 Unspecified atrial fibrillation: Secondary | ICD-10-CM | POA: Diagnosis present

## 2016-05-12 HISTORY — PX: CARDIOVERSION: SHX1299

## 2016-05-12 SURGERY — CARDIOVERSION
Anesthesia: Monitor Anesthesia Care

## 2016-05-12 MED ORDER — SODIUM CHLORIDE 0.9 % IV SOLN
INTRAVENOUS | Status: DC
  Administered 2016-05-12: 14:00:00 via INTRAVENOUS

## 2016-05-12 MED ORDER — SODIUM CHLORIDE 0.9 % IV SOLN
INTRAVENOUS | Status: DC | PRN
  Administered 2016-05-12: 14:00:00 via INTRAVENOUS

## 2016-05-12 MED ORDER — LIDOCAINE HCL (CARDIAC) 20 MG/ML IV SOLN
INTRAVENOUS | Status: DC | PRN
Start: 2016-05-12 — End: 2016-05-12
  Administered 2016-05-12: 40 mg via INTRATRACHEAL

## 2016-05-12 MED ORDER — PROPOFOL 10 MG/ML IV BOLUS
INTRAVENOUS | Status: DC | PRN
  Administered 2016-05-12: 60 mg via INTRAVENOUS

## 2016-05-12 NOTE — Anesthesia Procedure Notes (Signed)
Procedure Name: MAC Date/Time: 05/12/2016 1:47 PM Performed by: Neldon Newport Pre-anesthesia Checklist: Timeout performed, Patient being monitored, Suction available, Emergency Drugs available and Patient identified Patient Re-evaluated:Patient Re-evaluated prior to inductionOxygen Delivery Method: Ambu bag Preoxygenation: Pre-oxygenation with 100% oxygen

## 2016-05-12 NOTE — Discharge Instructions (Signed)
Monitored Anesthesia Care °Monitored anesthesia care is an anesthesia service for a medical procedure. Anesthesia is the loss of the ability to feel pain. It is produced by medicines called anesthetics. It may affect a small area of your body (local anesthesia), a large area of your body (regional anesthesia), or your entire body (general anesthesia). The need for monitored anesthesia care depends your procedure, your condition, and the potential need for regional or general anesthesia. It is often provided during procedures where:  °· General anesthesia may be needed if there are complications. This is because you need special care when you are under general anesthesia.   °· You will be under local or regional anesthesia. This is so that you are able to have higher levels of anesthesia if needed.   °· You will receive calming medicines (sedatives). This is especially the case if sedatives are given to put you in a semi-conscious state of relaxation (deep sedation). This is because the amount of sedative needed to produce this state can be hard to predict. Too much of a sedative can produce general anesthesia. °Monitored anesthesia care is performed by one or more health care providers who have special training in all types of anesthesia. You will need to meet with these health care providers before your procedure. During this meeting, they will ask you about your medical history. They will also give you instructions to follow. (For example, you will need to stop eating and drinking before your procedure. You may also need to stop or change medicines you are taking.) During your procedure, your health care providers will stay with you. They will:  °· Watch your condition. This includes watching your blood pressure, breathing, and level of pain.   °· Diagnose and treat problems that occur.   °· Give medicines if they are needed. These may include calming medicines (sedatives) and anesthetics.   °· Make sure you are  comfortable.   °Having monitored anesthesia care does not necessarily mean that you will be under anesthesia. It does mean that your health care providers will be able to manage anesthesia if you need it or if it occurs. It also means that you will be able to have a different type of anesthesia than you are having if you need it. When your procedure is complete, your health care providers will continue to watch your condition. They will make sure any medicines wear off before you are allowed to go home.  °  °This information is not intended to replace advice given to you by your health care provider. Make sure you discuss any questions you have with your health care provider. °  °Document Released: 06/23/2005 Document Revised: 10/18/2014 Document Reviewed: 11/08/2012 °Elsevier Interactive Patient Education ©2016 [employer] °Electrical Cardioversion, Care After °Refer to this sheet in the next few weeks. These instructions provide you with information on caring for yourself after your procedure. Your health care provider may also give you more specific instructions. Your treatment has been planned according to current medical practices, but problems sometimes occur. Call your health care provider if you have any problems or questions after your procedure. °WHAT TO EXPECT AFTER THE PROCEDURE °After your procedure, it is typical to have the following sensations: °· Some redness on the skin where the shocks were delivered. If this is tender, a sunburn lotion or hydrocortisone cream may help. °· Possible return of an abnormal heart rhythm within hours or days after the procedure. °HOME CARE INSTRUCTIONS °· Take medicines only as directed by your health care provider.   Be sure you understand how and when to take your medicine. °· Learn how to feel your pulse and check it often. °· Limit your activity for 48 hours after the procedure or as directed by your health care provider. °· Avoid or minimize caffeine and other  stimulants as directed by your health care provider. °SEEK MEDICAL CARE IF: °· You feel like your heart is beating too fast or your pulse is not regular. °· You have any questions about your medicines. °· You have bleeding that will not stop. °SEEK IMMEDIATE MEDICAL CARE IF: °· You are dizzy or feel faint. °· It is hard to breathe or you feel short of breath. °· There is a change in discomfort in your chest. °· Your speech is slurred or you have trouble moving an arm or leg on one side of your body. °· You get a serious muscle cramp that does not go away. °· Your fingers or toes turn cold or blue. °  °This information is not intended to replace advice given to you by your health care provider. Make sure you discuss any questions you have with your health care provider. °  °Document Released: 07/18/2013 Document Revised: 10/18/2014 Document Reviewed: 07/18/2013 °Elsevier Interactive Patient Education ©2016 [employer] ° °

## 2016-05-12 NOTE — Anesthesia Preprocedure Evaluation (Addendum)
Anesthesia Evaluation  Patient identified by MRN, date of birth, ID band Patient awake    Reviewed: Allergy & Precautions, NPO status , Patient's Chart, lab work & pertinent test results, reviewed documented beta blocker date and time   Airway Mallampati: I  TM Distance: >3 FB Neck ROM: Full    Dental  (+) Teeth Intact, Dental Advisory Given   Pulmonary neg pulmonary ROS,    breath sounds clear to auscultation       Cardiovascular hypertension, Pt. on medications and Pt. on home beta blockers + dysrhythmias Atrial Fibrillation  Rhythm:Irregular Rate:Abnormal     Neuro/Psych negative neurological ROS  negative psych ROS   GI/Hepatic negative GI ROS, Neg liver ROS,   Endo/Other  negative endocrine ROS  Renal/GU negative Renal ROS  negative genitourinary   Musculoskeletal negative musculoskeletal ROS (+)   Abdominal   Peds negative pediatric ROS (+)  Hematology negative hematology ROS (+)   Anesthesia Other Findings   Reproductive/Obstetrics negative OB ROS                            Lab Results  Component Value Date   WBC 5.6 05/11/2016   HGB 14.0 05/11/2016   HCT 42.2 05/11/2016   MCV 86.1 05/11/2016   PLT 217 05/11/2016   Lab Results  Component Value Date   CREATININE 1.04 (H) 05/11/2016   BUN 20 05/11/2016   NA 139 05/11/2016   K 3.9 05/11/2016   CL 104 05/11/2016   CO2 29 05/11/2016   Lab Results  Component Value Date   INR 1.30 10/08/2015   INR 2.0 (H) 06/07/2014   INR 1.9 (H) 06/30/2009   05/2016 EKG: atrial fibrillation.  Anesthesia Physical Anesthesia Plan  ASA: III  Anesthesia Plan: MAC   Post-op Pain Management:    Induction: Intravenous  Airway Management Planned: Natural Airway  Additional Equipment:   Intra-op Plan:   Post-operative Plan:   Informed Consent: I have reviewed the patients History and Physical, chart, labs and discussed the  procedure including the risks, benefits and alternatives for the proposed anesthesia with the patient or authorized representative who has indicated his/her understanding and acceptance.   Dental Advisory Given  Plan Discussed with: CRNA  Anesthesia Plan Comments:        Anesthesia Quick Evaluation

## 2016-05-12 NOTE — Transfer of Care (Signed)
Immediate Anesthesia Transfer of Care Note  Patient: Yolanda Bell  Procedure(s) Performed: Procedure(s): CARDIOVERSION (N/A)  Patient Location: Endoscopy Unit  Anesthesia Type:MAC  Level of Consciousness: awake, alert  and oriented  Airway & Oxygen Therapy: Patient Spontanous Breathing and Patient connected to nasal cannula oxygen  Post-op Assessment: Report given to RN, Post -op Vital signs reviewed and stable and Patient moving all extremities X 4  Post vital signs: Reviewed and stable  Last Vitals:  Vitals:   05/12/16 1243  BP: 133/71  Resp: 16    Last Pain: There were no vitals filed for this visit.       Complications: No apparent anesthesia complications

## 2016-05-12 NOTE — Interval H&P Note (Signed)
History and Physical Interval Note:  05/12/2016 1:22 PM  Yolanda Bell  has presented today for surgery, with the diagnosis of afib  The various methods of treatment have been discussed with the patient and family. After consideration of risks, benefits and other options for treatment, the patient has consented to  Procedure(s): CARDIOVERSION (N/A) as a surgical intervention .  The patient's history has been reviewed, patient examined, no change in status, stable for surgery.  I have reviewed the patient's chart and labs.  Questions were answered to the patient's satisfaction.     Skeet Latch, MD

## 2016-05-12 NOTE — CV Procedure (Signed)
Electrical Cardioversion Procedure Note Yolanda Bell [id number] 30-Jul-1941  Procedure: Electrical Cardioversion Indications:  Atrial Fibrillation  Procedure Details Consent: Risks of procedure as well as the alternatives and risks of each were explained to the (patient/caregiver).  Consent for procedure obtained. Time Out: Verified patient identification, verified procedure, site/side was marked, verified correct patient position, special equipment/implants available, medications/allergies/relevent history reviewed, required imaging and test results available.  Performed  Patient placed on cardiac monitor, pulse oximetry, supplemental oxygen as necessary.  Sedation given: Propofol, lidocaine Pacer pads placed anterior and posterior chest.  Cardioverted 1 time(s).  Cardioverted at 120J.  Evaluation Findings: Post procedure EKG shows: NSR Complications: Over 7 second pause after cardioversion Patient did tolerate procedure well.   Skeet Latch, MD 05/12/2016, 1:55 PM

## 2016-05-12 NOTE — H&P (View-Only) (Signed)
Patient ID: Yolanda Bell, female   DOB: Apr 25, 1941, 75 y.o.   MRN: [id number]     Primary Care Physician: Gennette Pac, MD Referring Physician: [facility] triage Cardiologist: Dr. Acie Fredrickson EP: Dr. Hurshel Party Yolanda Bell is a 75 y.o. female with a h/o constrictive pericarditis with pericardial stripping at the [facility] in [place] in 2006, SVT ablation by Dr. Caryl Comes, remote past,, gout  Afib/flutter, with 5-6 cardioversion's over the years, last DCCV in January 2017. Metoprolol was increased to 75 mg bid end of last week and pt has felt more comfortable with less chaotic beating of the heart. However, she still has fatigue and shortness of breath. She feels that she may have gone back in SR briefly Saturday but requires cardioversion to return her to [place]. She is going out of town this weekend, as well as next weekend. Last complete echo in 2014 and last TEE in 2015 at time of a cardioversion, showed mod MR/mod TR. EF 45-50%. She tried amiodarone and did not tolerate at time of her pericardial stripping.She saw Dr. Curt Bears in January this year, after last cardioversion, to discuss ablation but pt hoped that cardioversion would hold her in rhythm and she wanted to delay ablation at long as possible. No missed doses of xarelto for chadsvasc score of at least 3.  Today, she denies symptoms of chest pain,  orthopnea, PND, lower extremity edema, dizziness, presyncope, syncope, or neurologic sequela. Positive for shortness of breath, palpitations. The patient is tolerating medications without difficulties and is otherwise without complaint today.   Past Medical History:  Diagnosis Date  . Atrial fibrillation ([place])    Status post TEE cardioversion  . Constrictive pericarditis    2 status post pericardial stripping at the [facility]  . Gout   . Hypertension   . Spastic dysphonia   . Supraventricular tachycardia ([place])    Status post RF ablation by Dr. Peterson Lombard   Past Surgical History:  Procedure  Laterality Date  . ABDOMINAL HYSTERECTOMY     1987  . CARDIOVERSION  11/21/2012   Procedure: CARDIOVERSION;  Surgeon: Darlin Coco, MD;  Location: [facility];  Service: Cardiovascular;;  . CARDIOVERSION N/A 06/10/2014   Procedure: CARDIOVERSION;  Surgeon: Thayer Headings, MD;  Location: [facility] ENDOSCOPY;  Service: Cardiovascular;  Laterality: N/A;  . CARDIOVERSION N/A 10/14/2015   Procedure: CARDIOVERSION;  Surgeon: Josue Hector, MD;  Location: [facility];  Service: Cardiovascular;  Laterality: N/A;  . CHOLECYSTECTOMY     1989  . EYE SURGERY     cataracts  . PERICARDIECTOMY    . TEE WITHOUT CARDIOVERSION N/A 06/10/2014   Procedure: TRANSESOPHAGEAL ECHOCARDIOGRAM (TEE);  Surgeon: Thayer Headings, MD;  Location: [facility];  Service: Cardiovascular;  Laterality: N/A;  . TONSILLECTOMY      Current Outpatient Prescriptions  Medication Sig Dispense Refill  . alendronate (FOSAMAX) 70 MG tablet Take 70 mg by mouth once a week. On Saturday or Sunday  4  . allopurinol (ZYLOPRIM) 300 MG tablet Take 300 mg by mouth daily. Reported on 02/26/2016  3  . cholecalciferol (VITAMIN D) 1000 units tablet Take 1,000 Units by mouth daily.    . indomethacin (INDOCIN) 25 MG capsule Take 25 mg by mouth 2 (two) times daily with a meal.    . metoprolol succinate (TOPROL-XL) 25 MG 24 hr tablet Take 3 tablets (75 mg total) by mouth daily. (Patient taking differently: Take 75 mg by mouth 2 (two) times daily. )    .  potassium chloride (KLOR-CON M10) 10 MEQ tablet TAKE 1 TABLET (10 MEQ TOTAL) BY MOUTH DAILY. 90 tablet 3  . rivaroxaban (XARELTO) 20 MG TABS tablet Take 20 mg by mouth daily with supper.    Marland Kitchen spironolactone (ALDACTONE) 50 MG tablet Take 50 mg by mouth daily.    Marland Kitchen torsemide (DEMADEX) 20 MG tablet TAKE 1 TABLET (20 MG TOTAL) BY MOUTH DAILY. 90 tablet 3  . celecoxib (CELEBREX) 200 MG capsule Take 200 mg by mouth daily as needed. As needed for gout flare ups  1  . Glucosamine-Chondroit-Vit C-Mn  (GLUCOSAMINE-CHONDROITIN MAX ST PO) Take 1 tablet by mouth 2 (two) times daily.     No current facility-administered medications for this encounter.     Allergies  Allergen Reactions  . Amiodarone Other (See Comments)    Dizziness, halucinations  . Other Other (See Comments)    Cannot take STEROIDS-unknown reaction-caused by internal scarring on liver.    . Azithromycin Nausea And Vomiting and Rash    Social History   Social History  . Marital status: Divorced    Spouse name: N/A  . Number of children: N/A  . Years of education: N/A   Occupational History  . Not on file.   Social History Main Topics  . Smoking status: Never Smoker  . Smokeless tobacco: Never Used  . Alcohol use Yes     Comment: occasionally  . Drug use: No  . Sexual activity: Not on file   Other Topics Concern  . Not on file   Social History Narrative  . No narrative on file    Family History  Problem Relation Age of Onset  . Heart disease Mother   . Emphysema Father   . Breast cancer Sister     ROS- All systems are reviewed and negative except as per the HPI above  Physical Exam: Vitals:   05/11/16 0902  BP: 136/82  Pulse: (!) 115  Weight: 170 lb (77.1 kg)  Height: 5\' 4"  (1.626 m)    GEN- The patient is well appearing, alert and oriented x 3 today.   Head- normocephalic, atraumatic Eyes-  Sclera clear, conjunctiva pink Ears- hearing intact Oropharynx- clear Neck- supple, no JVP Lymph- no cervical lymphadenopathy Lungs- Clear to ausculation bilaterally, normal work of breathing Heart- Regular rate and rhythm, no murmurs, rubs or gallops, PMI not laterally displaced GI- soft, NT, ND, + BS Extremities- no clubbing, cyanosis, or edema MS- no significant deformity or atrophy Skin- no rash or lesion Psych- euthymic mood, full affect Neuro- strength and sensation are intact  EKG- afib with RVR, v rate 115 bpm, qrs int 88 ms, qtc 456 ms Epic records reviewed Echo 2014-- Left  ventricle: The cavity size was normal. Wall thickness was increased in a pattern of mild LVH. Systolic function was normal. The estimated ejection fraction was in the range of 50% to 55%. Wall motion was normal; there were no regional wall motion abnormalities. The study is not technically sufficient to allow evaluation of LV diastolic function. - Mitral valve: Calcified annulus. Moderate regurgitation. - Left atrium: The atrium was mildly dilated. Impressions:  - Pericardium not well visualized; consider CT to further assess if clinically indicated and concern for constrictive pericarditis high.  Left atrium size 44 mm  Assessment and Plan: 1. PAF Persistent x 36+ hours Failed amiodarone in the past Discussed ablation with Dr. Curt Bears in January but wanted to wait Usually requires DCCV to restore SR NO MISSED DOSES OF [place] for  dccv tomorrow Continue metoprolol 75 mg bid, return to usual dose of metoprolol tomorrow am  Cbc, cmet, mag tsh today Echo early next week for further evaluation of valvular insufficiency, if has worsened, then possibly valve repair/maze may be worth considering  F/u with Dr. Curt Bears 7-10 days after cardioversion  Butch Penny C. Carroll, [facility] [address] [place], [place] [address] [phone]

## 2016-05-12 NOTE — Anesthesia Postprocedure Evaluation (Signed)
Anesthesia Post Note  Patient: DELTA SIMARD  Procedure(s) Performed: Procedure(s) (LRB): CARDIOVERSION (N/A)  Patient location during evaluation: PACU Anesthesia Type: MAC Level of consciousness: awake and alert Pain management: pain level controlled Vital Signs Assessment: post-procedure vital signs reviewed and stable Respiratory status: spontaneous breathing, nonlabored ventilation, respiratory function stable and patient connected to nasal cannula oxygen Cardiovascular status: stable and blood pressure returned to baseline Anesthetic complications: no    Last Vitals:  Vitals:   05/12/16 1410 05/12/16 1415  BP: 100/61 (!) 111/54  Pulse: 68 68  Resp: 16 14  Temp:      Last Pain:  Vitals:   05/12/16 1406  TempSrc: Oral                 Effie Berkshire

## 2016-05-13 ENCOUNTER — Telehealth: Payer: Self-pay | Admitting: Cardiovascular Disease

## 2016-05-13 NOTE — Telephone Encounter (Signed)
New message    Pt states the hospital does not want to change dosage of metropolol. She has been up to triple the dosage for the since Thursday of last week. She want to know if she should stay on the triple dosage? BP reading 105/53 this morning, Heart rate 47.   Pt c/o medication issue:  1. Name of Medication: metropolol 2. How are you currently taking this medication (dosage and times per day)? 75mg  twice a day  3. Are you having a reaction (difficulty breathing--STAT)? Pt feels lightheaded breathing is fine and no SOB  4. What is your medication issue?  Pt feels foggy headed and heart is low 47 this morning and reading 50 and 51 the rest of the day.

## 2016-05-13 NOTE — Telephone Encounter (Signed)
Pt has had slow heart rate( upper 40's to low 50's) since being cardioverted back in SR and feels lightheaded. She was instructed to go back to previous dose of BB after cardioversion but pt forgot. I called pt and told her to decrease metoprolol to 25 mg bid and check BP and HR in the am and let afib clinic know results. She acknowledged that she would go back to old BB dose.

## 2016-05-13 NOTE — Telephone Encounter (Signed)
Patient called to report she believes she is taking too much Metoprolol. Confirmed with her she is taking 75 mg BID. Her BP ranged from 110-115/55-62 and her HR ranged from 47-51 today.  When she stands and walks, she feels lightheaded.  She reports no other symptoms.  Informed her the Afib Clinic would be consulted. She understands she will receive a phone call with recommendations.

## 2016-05-14 NOTE — Telephone Encounter (Signed)
Pt called in stating feeling much better on reduced dose of metoprolol BP 112/59 HR 50-59.

## 2016-05-18 ENCOUNTER — Ambulatory Visit (HOSPITAL_COMMUNITY)
Admission: RE | Admit: 2016-05-18 | Discharge: 2016-05-18 | Disposition: A | Discharge status: Home / Self Care | Payer: Medicare Other | Source: Ambulatory Visit | Attending: Nurse Practitioner | Admitting: Nurse Practitioner

## 2016-05-18 ENCOUNTER — Other Ambulatory Visit (HOSPITAL_COMMUNITY): Payer: Self-pay | Admitting: *Deleted

## 2016-05-18 DIAGNOSIS — I340 Nonrheumatic mitral (valve) insufficiency: Secondary | ICD-10-CM | POA: Insufficient documentation

## 2016-05-18 DIAGNOSIS — I517 Cardiomegaly: Secondary | ICD-10-CM | POA: Diagnosis not present

## 2016-05-18 DIAGNOSIS — I481 Persistent atrial fibrillation: Secondary | ICD-10-CM | POA: Diagnosis not present

## 2016-05-18 DIAGNOSIS — I5189 Other ill-defined heart diseases: Secondary | ICD-10-CM | POA: Diagnosis not present

## 2016-05-18 DIAGNOSIS — I4891 Unspecified atrial fibrillation: Secondary | ICD-10-CM | POA: Diagnosis present

## 2016-05-18 DIAGNOSIS — I4819 Other persistent atrial fibrillation: Secondary | ICD-10-CM

## 2016-05-18 MED ORDER — METOPROLOL SUCCINATE ER 25 MG PO TB24: 25.0000 mg | ORAL_TABLET | Freq: Every day | ORAL | Status: DC

## 2016-05-18 MED ORDER — METOPROLOL SUCCINATE ER 25 MG PO TB24: 25.0000 mg | ORAL_TABLET | Freq: Two times a day (BID) | ORAL | Status: DC

## 2016-05-18 NOTE — Progress Notes (Signed)
  Echocardiogram 2D Echocardiogram has been performed.  Donata Clay 05/18/2016, 2:57 PM

## 2016-05-19 ENCOUNTER — Other Ambulatory Visit: Payer: Self-pay | Admitting: Cardiovascular Disease

## 2016-05-23 NOTE — Progress Notes (Signed)
Electrophysiology Office Note   Date:  05/24/2016   ID:  Aleksia, Dravis 05/09/1941, MRN [id number]  PCP:  Gennette Pac, MD  Cardiologist:  Nahser Primary Electrophysiologist:  [name], MD    Chief Complaint  Patient presents with  . Follow-up     History of Present Illness: Yolanda Bell is a 75 y.o. female who presents today for electrophysiology evaluation.   She has PMH of constrictive pericarditis s/p pericardial stripping at [facility] 2006, SVT s/p RFA by Dr. Caryl Comes, HTN, gout and afib/aflutter currently on Xarelto. She previously underwent DCCV in Feb 2014 however had recurrence since. She most recently underwent TEE DCCV on 06/10/2014. TEE on on that day showed EF 45-50%, mild to moderate TR, moderate MR. Patient has cardiac awareness of afib with chest pressure and palpitation. Had repeat cardioversion 05/12/16.  Today, she denies symptoms of palpitations, chest pain, shortness of breath, orthopnea, PND, lower extremity edema, claudication, dizziness, presyncope, syncope, bleeding, or neurologic sequela. The patient is tolerating medications without difficulties and is otherwise without complaint today. He is been feeling well since cardioversion without any major complaints.   Past Medical History:  Diagnosis Date  . Atrial fibrillation ([place])    Status post TEE cardioversion  . Constrictive pericarditis    2 status post pericardial stripping at the [facility]  . Gout   . Hypertension   . Spastic dysphonia   . Supraventricular tachycardia ([place])    Status post RF ablation by Dr. Peterson Lombard   Past Surgical History:  Procedure Laterality Date  . ABDOMINAL HYSTERECTOMY     1987  . CARDIOVERSION  11/21/2012   Procedure: CARDIOVERSION;  Surgeon: Darlin Coco, MD;  Location: [place];  Service: Cardiovascular;;  . CARDIOVERSION N/A 06/10/2014   Procedure: CARDIOVERSION;  Surgeon: Thayer Headings, MD;  Location: [facility] ENDOSCOPY;  Service:  Cardiovascular;  Laterality: N/A;  . CARDIOVERSION N/A 10/14/2015   Procedure: CARDIOVERSION;  Surgeon: Josue Hector, MD;  Location: [facility] ENDOSCOPY;  Service: Cardiovascular;  Laterality: N/A;  . CARDIOVERSION N/A 05/12/2016   Procedure: CARDIOVERSION;  Surgeon: Skeet Latch, MD;  Location: [facility] ENDOSCOPY;  Service: Cardiovascular;  Laterality: N/A;  . CHOLECYSTECTOMY     1989  . EYE SURGERY     cataracts  . PERICARDIECTOMY    . TEE WITHOUT CARDIOVERSION N/A 06/10/2014   Procedure: TRANSESOPHAGEAL ECHOCARDIOGRAM (TEE);  Surgeon: Thayer Headings, MD;  Location: [place];  Service: Cardiovascular;  Laterality: N/A;  . TONSILLECTOMY       Current Outpatient Prescriptions  Medication Sig Dispense Refill  . alendronate (FOSAMAX) 70 MG tablet Take 70 mg by mouth once a week. On Saturday or Sunday  4  . allopurinol (ZYLOPRIM) 300 MG tablet Take 300 mg by mouth daily. Reported on 02/26/2016  3  . celecoxib (CELEBREX) 200 MG capsule Take 200 mg by mouth daily as needed. As needed for gout flare ups  1  . cholecalciferol (VITAMIN D) 1000 units tablet Take 1,000 Units by mouth daily.    . Glucosamine-Chondroit-Vit C-Mn (GLUCOSAMINE-CHONDROITIN MAX ST PO) Take 1 tablet by mouth 2 (two) times daily.    . metoprolol succinate (TOPROL-XL) 25 MG 24 hr tablet Take 1 tablet (25 mg total) by mouth 2 (two) times daily.    . potassium chloride (KLOR-CON M10) 10 MEQ tablet TAKE 1 TABLET (10 MEQ TOTAL) BY MOUTH DAILY. 90 tablet 3  . rivaroxaban (XARELTO) 20 MG TABS tablet Take 20 mg by mouth daily with  supper.    Marland Kitchen spironolactone (ALDACTONE) 50 MG tablet Take 50 mg by mouth daily.    Marland Kitchen torsemide (DEMADEX) 20 MG tablet TAKE 1 TABLET (20 MG TOTAL) BY MOUTH DAILY. 90 tablet 3   No current facility-administered medications for this visit.     Allergies:   Amiodarone; Other; and Erythromycin   Social History:  The patient  reports that she has never smoked. She has never used smokeless tobacco. She reports  that she drinks alcohol. She reports that she does not use drugs.   Family History:  The patient's family history includes Breast cancer in her sister; Emphysema in her father; Heart disease in her mother.    ROS:  Please see the history of present illness.   Otherwise, review of systems is positive for joint swelling.   All other systems are reviewed and negative.    PHYSICAL EXAM: VS:  BP 128/64   Pulse 68   Ht 5\' 4"  (1.626 m)   Wt 167 lb 6.4 oz (75.9 kg)   BMI 28.73 kg/m  , BMI Body mass index is 28.73 kg/m. GEN: Well nourished, well developed, in no acute distress  HEENT: normal  Neck: no JVD, carotid bruits, or masses Cardiac: RRR; no murmurs, rubs, or gallops,no edema  Respiratory:  clear to auscultation bilaterally, normal work of breathing GI: soft, nontender, nondistended, + BS MS: no deformity or atrophy  Skin: warm and dry Neuro:  Strength and sensation are intact Psych: euthymic mood, full affect  EKG:  EKG is ordered today. Personal review of the ekg ordered today shows sinus rhythm, rate 68   Recent Labs: 05/11/2016: ALT 14; BUN 20; Creatinine, Ser 1.04; Hemoglobin 14.0; Magnesium 1.8; Platelets 217; Potassium 3.9; Sodium 139; TSH 3.188    Lipid Panel     Component Value Date/Time   CHOL 158 08/18/2015 1147   TRIG 83 08/18/2015 1147   HDL 47 08/18/2015 1147   CHOLHDL 3.4 08/18/2015 1147   VLDL 17 08/18/2015 1147   LDLCALC 94 08/18/2015 1147     Wt Readings from Last 3 Encounters:  05/24/16 167 lb 6.4 oz (75.9 kg)  05/11/16 170 lb (77.1 kg)  02/26/16 171 lb (77.6 kg)      Other studies Reviewed: Additional studies/ records that were reviewed today include: TEE/cardioversion Review of the above records today demonstrates:  - Left ventricle: Systolic function was mildly reduced. The estimated ejection fraction was in the range of 45% to 50%. No evidence of thrombus. - Mitral valve: There was moderate regurgitation. - Left atrium: No evidence  of thrombus in the atrial cavity or appendage. - Tricuspid valve: There was mild-moderate regurgitation.  Impressions:  - Successful cardioversion. No cardiac source of emboli was indentified.   TTE 05/18/16 - Low normal LV systolic function; grade 1 diastolic dysfunction;   mild MR; mild LAE.  ASSESSMENT AND PLAN:  1.  Persistent atrial fibrillation: on Xarelto; required recent cardioversion to sinus rhythm.  I discussed with her further options of treatment including treatment with dofetilide versus ablation. We had a long discussion of the risks and benefits of ablation. Risks include bleeding, tamponade, heart block, stroke, and damage to surrounding organs, among other risks. She understands these risks and has agreed to the ablation procedure.   This patients CHA2DS2-VASc Score and unadjusted Ischemic Stroke Rate (% per year) is equal to 3.2 % stroke rate/year from a score of 3  Above score calculated as 1 point each if present [CHF, HTN, DM, Vascular=MI/PAD/Aortic  Plaque, Age if 36-74, or Female] Above score calculated as 2 points each if present [Age > 75, or Stroke/TIA/TE]  2. constrictive pericarditis s/p pericardial stripping at [facility] 2006 3. h/o SVT s/p RFA by Dr. Caryl Comes 4. HTN: increase metoprolol XL to BID Exam his weight has been   Current medicines are reviewed at length with the patient today.   The patient does not have concerns regarding her medicines.  The following changes were made today:  none  Labs/ tests ordered today include:  No orders of the defined types were placed in this encounter.    Disposition:   FU with Will Camnitz 6 months  Signed, [name], MD  05/24/2016 2:03 PM     [place] [address] [phone] (office) [phone] (fax)

## 2016-05-24 ENCOUNTER — Encounter: Payer: Self-pay | Admitting: Cardiology

## 2016-05-24 ENCOUNTER — Ambulatory Visit (INDEPENDENT_AMBULATORY_CARE_PROVIDER_SITE_OTHER): Payer: Medicare Other | Admitting: Cardiology

## 2016-05-24 VITALS — BP 128/64 | HR 68 | Ht 64.0 in | Wt 167.4 lb

## 2016-05-24 DIAGNOSIS — I4819 Other persistent atrial fibrillation: Secondary | ICD-10-CM

## 2016-05-24 DIAGNOSIS — I481 Persistent atrial fibrillation: Secondary | ICD-10-CM

## 2016-05-24 DIAGNOSIS — I480 Paroxysmal atrial fibrillation: Secondary | ICD-10-CM | POA: Diagnosis not present

## 2016-05-24 NOTE — Patient Instructions (Signed)
Your physician recommends that you continue on your current medications as directed. Please refer to the Current Medication list given to you today.  Your physician has recommended that you have an ablation. Catheter ablation is a medical procedure used to treat some cardiac arrhythmias (irregular heartbeats). During catheter ablation, a long, thin, flexible tube is put into a blood vessel in your groin (upper thigh), or neck. This tube is called an ablation catheter. It is then guided to your heart through the blood vessel. Radio frequency waves destroy small areas of heart tissue where abnormal heartbeats may cause an arrhythmia to start. Please see the instruction sheet given to you today.  Dr. Macky Lower nurse, Sherri, will contact you to set up this procedure.

## 2016-06-11 ENCOUNTER — Other Ambulatory Visit: Payer: Self-pay | Admitting: Cardiovascular Disease

## 2016-06-18 NOTE — Telephone Encounter (Signed)
Yolanda Bell, this was sent to me asking me to close the encounter. Not sure that it is ok to close it as it was last routed it to Dr Acie Fredrickson.

## 2016-06-21 NOTE — Telephone Encounter (Signed)
I will fax you a referral for Dr. Acie Fredrickson to sign and fax back to cardiac rehab, or he can put in a referral via EPIC for cardiac rehab maintenance program "AMB Referral to Cardiac Rehab Maintenance Program ([place] Only)- REF [id number]".

## 2016-07-02 ENCOUNTER — Telehealth: Payer: Self-pay | Admitting: *Deleted

## 2016-07-02 DIAGNOSIS — Z01812 Encounter for preprocedural laboratory examination: Secondary | ICD-10-CM

## 2016-07-02 DIAGNOSIS — I4819 Other persistent atrial fibrillation: Secondary | ICD-10-CM

## 2016-07-02 NOTE — Telephone Encounter (Signed)
Scheduled AFib ablation for 11/10. H&P and pre labs scheduled for 11/9. Post ablation f/u appts to be arranged when she comes in on 11/9. Letter of instructions reviewed with patient and mailed to home address. Patient verbalized understanding and agreeable to plan.

## 2016-07-02 NOTE — Telephone Encounter (Signed)
Lmtcb to arrange ablation

## 2016-07-06 ENCOUNTER — Encounter: Payer: Self-pay | Admitting: Cardiology

## 2016-08-12 ENCOUNTER — Other Ambulatory Visit: Payer: Medicare Other | Admitting: *Deleted

## 2016-08-12 ENCOUNTER — Telehealth: Payer: Self-pay | Admitting: Cardiology

## 2016-08-12 DIAGNOSIS — Z01812 Encounter for preprocedural laboratory examination: Secondary | ICD-10-CM

## 2016-08-12 DIAGNOSIS — I4819 Other persistent atrial fibrillation: Secondary | ICD-10-CM

## 2016-08-12 LAB — CBC WITH DIFFERENTIAL/PLATELET
BASOS ABS: 0 {cells}/uL (ref 0–200)
BASOS PCT: 0 %
EOS ABS: 54 {cells}/uL (ref 15–500)
Eosinophils Relative: 1 %
HEMATOCRIT: 36.7 % (ref 35.0–45.0)
Hemoglobin: 12.6 g/dL (ref 11.7–15.5)
LYMPHS PCT: 30 %
Lymphs Abs: 1620 cells/uL (ref 850–3900)
MCH: 29 pg (ref 27.0–33.0)
MCHC: 34.3 g/dL (ref 32.0–36.0)
MCV: 84.4 fL (ref 80.0–100.0)
MONO ABS: 324 {cells}/uL (ref 200–950)
MPV: 10.4 fL (ref 7.5–12.5)
Monocytes Relative: 6 %
NEUTROS PCT: 63 %
Neutro Abs: 3402 cells/uL (ref 1500–7800)
Platelets: 172 10*3/uL (ref 140–400)
RBC: 4.35 MIL/uL (ref 3.80–5.10)
RDW: 13.8 % (ref 11.0–15.0)
WBC: 5.4 10*3/uL (ref 3.8–10.8)

## 2016-08-12 LAB — BASIC METABOLIC PANEL
BUN: 19 mg/dL (ref 7–25)
CHLORIDE: 103 mmol/L (ref 98–110)
CO2: 27 mmol/L (ref 20–31)
CREATININE: 0.9 mg/dL (ref 0.60–0.93)
Calcium: 9.3 mg/dL (ref 8.6–10.4)
GLUCOSE: 91 mg/dL (ref 65–99)
POTASSIUM: 3.6 mmol/L (ref 3.5–5.3)
Sodium: 141 mmol/L (ref 135–146)

## 2016-08-12 NOTE — Telephone Encounter (Signed)
Walk In Pt Form-Patient has questions about cough- please call will give to Sherri on Monday 11/06

## 2016-08-13 ENCOUNTER — Ambulatory Visit (HOSPITAL_COMMUNITY)
Admission: RE | Admit: 2016-08-13 | Discharge: 2016-08-13 | Disposition: A | Discharge status: Home / Self Care | Payer: Medicare Other | Source: Ambulatory Visit | Attending: Cardiology | Admitting: Cardiology

## 2016-08-13 ENCOUNTER — Encounter (HOSPITAL_COMMUNITY): Payer: Self-pay

## 2016-08-13 ENCOUNTER — Ambulatory Visit (HOSPITAL_COMMUNITY): Admission: RE | Admit: 2016-08-13 | Payer: Medicare Other | Source: Ambulatory Visit

## 2016-08-13 DIAGNOSIS — R911 Solitary pulmonary nodule: Secondary | ICD-10-CM | POA: Insufficient documentation

## 2016-08-13 DIAGNOSIS — I481 Persistent atrial fibrillation: Secondary | ICD-10-CM | POA: Diagnosis present

## 2016-08-13 DIAGNOSIS — I2510 Atherosclerotic heart disease of native coronary artery without angina pectoris: Secondary | ICD-10-CM | POA: Insufficient documentation

## 2016-08-13 DIAGNOSIS — I4891 Unspecified atrial fibrillation: Secondary | ICD-10-CM | POA: Diagnosis not present

## 2016-08-13 DIAGNOSIS — I4819 Other persistent atrial fibrillation: Secondary | ICD-10-CM

## 2016-08-13 MED ORDER — NITROGLYCERIN 0.4 MG SL SUBL
SUBLINGUAL_TABLET | SUBLINGUAL | Status: AC
  Filled 2016-08-13: qty 1

## 2016-08-13 MED ORDER — NITROGLYCERIN 0.4 MG SL SUBL
0.4000 mg | SUBLINGUAL_TABLET | Freq: Once | SUBLINGUAL | Status: AC
  Administered 2016-08-13: 0.4 mg via SUBLINGUAL

## 2016-08-13 MED ORDER — IOPAMIDOL (ISOVUE-370) INJECTION 76%
INTRAVENOUS | Status: AC
  Administered 2016-08-13: 80 mL
  Filled 2016-08-13: qty 100

## 2016-08-18 NOTE — Progress Notes (Signed)
Electrophysiology Office Note   Date:  08/19/2016   ID:  Zeldy, Ahlert Dec 05, 1940, MRN [id number]  PCP:  Gennette Pac, MD  Cardiologist:  Nahser Primary Electrophysiologist:  Johnathen Testa Meredith Leeds, MD    Chief Complaint  Patient presents with  . Follow-up    AFib ablation/H&P     History of Present Illness: Yolanda Bell is a 75 y.o. female who presents today for electrophysiology evaluation.   She has PMH of constrictive pericarditis s/p pericardial stripping at [facility] 2006, SVT s/p RFA by Dr. Caryl Comes, HTN, gout and afib/aflutter currently on Xarelto. She previously underwent DCCV in Feb 2014 however had recurrence since. She most recently underwent TEE DCCV on 06/10/2014. TEE on on that day showed EF 45-50%, mild to moderate TR, moderate MR. Patient has cardiac awareness of afib with chest pressure and palpitation. Had repeat cardioversion 05/12/16. Plan for AF ablation tomorrow.  Today, she denies symptoms of palpitations, chest pain, shortness of breath, orthopnea, PND, lower extremity edema, claudication, dizziness, presyncope, syncope, bleeding, or neurologic sequela. The patient is tolerating medications without difficulties and is otherwise without complaint today.    Past Medical History:  Diagnosis Date  . Atrial fibrillation ([place])    Status post TEE cardioversion  . Constrictive pericarditis    2 status post pericardial stripping at the [facility]  . Gout   . Hypertension   . Spastic dysphonia   . Supraventricular tachycardia ([place])    Status post RF ablation by Dr. Peterson Lombard   Past Surgical History:  Procedure Laterality Date  . ABDOMINAL HYSTERECTOMY     1987  . CARDIOVERSION  11/21/2012   Procedure: CARDIOVERSION;  Surgeon: Darlin Coco, MD;  Location: [place];  Service: Cardiovascular;;  . CARDIOVERSION N/A 06/10/2014   Procedure: CARDIOVERSION;  Surgeon: Thayer Headings, MD;  Location: [facility] ENDOSCOPY;  Service: Cardiovascular;  Laterality:  N/A;  . CARDIOVERSION N/A 10/14/2015   Procedure: CARDIOVERSION;  Surgeon: Josue Hector, MD;  Location: [facility] ENDOSCOPY;  Service: Cardiovascular;  Laterality: N/A;  . CARDIOVERSION N/A 05/12/2016   Procedure: CARDIOVERSION;  Surgeon: Skeet Latch, MD;  Location: [facility] ENDOSCOPY;  Service: Cardiovascular;  Laterality: N/A;  . CHOLECYSTECTOMY     1989  . EYE SURGERY     cataracts  . PERICARDIECTOMY    . TEE WITHOUT CARDIOVERSION N/A 06/10/2014   Procedure: TRANSESOPHAGEAL ECHOCARDIOGRAM (TEE);  Surgeon: Thayer Headings, MD;  Location: [place];  Service: Cardiovascular;  Laterality: N/A;  . TONSILLECTOMY       Current Outpatient Prescriptions  Medication Sig Dispense Refill  . alendronate (FOSAMAX) 70 MG tablet Take 70 mg by mouth once a week. On Saturday or Sunday  4  . Ascorbic Acid (VITAMIN C PO) Take 2 tablets by mouth daily.    . celecoxib (CELEBREX) 200 MG capsule Take 200 mg by mouth daily as needed. As needed for gout flare ups  1  . Gluc-Chonn-MSM-Boswellia-Vit D (GLUCOSAMINE CHOND TRIPLE/VIT D) TABS Take 1 tablet by mouth 2 (two) times daily.    . indomethacin (INDOCIN) 25 MG capsule Take 25-50 mg by mouth 2 (two) times daily as needed (gout).   1  . metoprolol succinate (TOPROL-XL) 50 MG 24 hr tablet Take 25 mg by mouth 2 (two) times daily.     . potassium chloride (KLOR-CON M10) 10 MEQ tablet TAKE 1 TABLET (10 MEQ TOTAL) BY MOUTH DAILY. 90 tablet 3  . spironolactone (ALDACTONE) 50 MG tablet Take 50 mg by mouth daily.    Marland Kitchen  torsemide (DEMADEX) 20 MG tablet TAKE 1 TABLET (20 MG TOTAL) BY MOUTH DAILY. 90 tablet 3  . XARELTO 20 MG TABS tablet TAKE 1 TABLET BY MOUTH EVERY DAY WITH SUPPER 90 tablet 2   No current facility-administered medications for this visit.     Allergies:   Allopurinol; Amiodarone; Other; and Erythromycin   Social History:  The patient  reports that she has never smoked. She has never used smokeless tobacco. She reports that she drinks alcohol. She reports  that she does not use drugs.   Family History:  The patient's family history includes Breast cancer in her sister; Emphysema in her father; Heart disease in her mother.    ROS:  Please see the history of present illness.   Otherwise, review of systems is positive for cough.   All other systems are reviewed and negative.    PHYSICAL EXAM: VS:  BP 126/72   Pulse (!) 56   Ht 5\' 4"  (1.626 m)   Wt 165 lb 12.8 oz (75.2 kg)   BMI 28.46 kg/m  , BMI Body mass index is 28.46 kg/m. GEN: Well nourished, well developed, in no acute distress  HEENT: normal  Neck: no JVD, carotid bruits, or masses Cardiac: RRR; no murmurs, rubs, or gallops,no edema  Respiratory:  clear to auscultation bilaterally, normal work of breathing GI: soft, nontender, nondistended, + BS MS: no deformity or atrophy  Skin: warm and dry Neuro:  Strength and sensation are intact Psych: euthymic mood, full affect  EKG:  EKG is ordered today. Personal review of the ekg ordered today shows sinus rhythm, rate 68   Recent Labs: 05/11/2016: ALT 14; Magnesium 1.8; TSH 3.188 08/12/2016: BUN 19; Creat 0.90; Hemoglobin 12.6; Platelets 172; Potassium 3.6; Sodium 141    Lipid Panel     Component Value Date/Time   CHOL 158 08/18/2015 1147   TRIG 83 08/18/2015 1147   HDL 47 08/18/2015 1147   CHOLHDL 3.4 08/18/2015 1147   VLDL 17 08/18/2015 1147   LDLCALC 94 08/18/2015 1147     Wt Readings from Last 3 Encounters:  08/19/16 165 lb 12.8 oz (75.2 kg)  05/24/16 167 lb 6.4 oz (75.9 kg)  05/11/16 170 lb (77.1 kg)      Other studies Reviewed: Additional studies/ records that were reviewed today include: TEE/cardioversion Review of the above records today demonstrates:  - Left ventricle: Systolic function was mildly reduced. The estimated ejection fraction was in the range of 45% to 50%. No evidence of thrombus. - Mitral valve: There was moderate regurgitation. - Left atrium: No evidence of thrombus in the atrial cavity  or appendage. - Tricuspid valve: There was mild-moderate regurgitation.  Impressions:  - Successful cardioversion. No cardiac source of emboli was indentified.   TTE 05/18/16 - Low normal LV systolic function; grade 1 diastolic dysfunction;   mild MR; mild LAE.  ASSESSMENT AND PLAN:  1.  Persistent atrial fibrillation: on Xarelto; ablation scheduled 08/20/16.  She is currently feeling well and anxious to have the procedure.  Routine labs have been checked, CT done without abnormality.  This patients CHA2DS2-VASc Score and unadjusted Ischemic Stroke Rate (% per year) is equal to 3.2 % stroke rate/year from a score of 3  Above score calculated as 1 point each if present [CHF, HTN, DM, Vascular=MI/PAD/Aortic Plaque, Age if 65-74, or Female] Above score calculated as 2 points each if present [Age > 75, or Stroke/TIA/TE]  2. constrictive pericarditis s/p pericardial stripping at [facility] 2006 3. h/o  SVT s/p RFA by Dr. Caryl Comes 4. HTN: increase metoprolol XL to BID Exam his weight has been   Current medicines are reviewed at length with the patient today.   The patient does not have concerns regarding her medicines.  The following changes were made today:  none  Labs/ tests ordered today include:  No orders of the defined types were placed in this encounter.    Disposition:   FU with Zerenity Bowron 6 months  Signed, Brandan Robicheaux Meredith Leeds, MD  08/19/2016 10:04 AM     [name] [address] [place] [address] [phone] (office) [phone] (fax)

## 2016-08-19 ENCOUNTER — Encounter: Payer: Self-pay | Admitting: Cardiology

## 2016-08-19 ENCOUNTER — Ambulatory Visit (INDEPENDENT_AMBULATORY_CARE_PROVIDER_SITE_OTHER): Payer: Medicare Other | Admitting: Cardiology

## 2016-08-19 ENCOUNTER — Encounter: Payer: Self-pay | Admitting: *Deleted

## 2016-08-19 VITALS — BP 126/72 | HR 56 | Ht 64.0 in | Wt 165.8 lb

## 2016-08-19 DIAGNOSIS — I4819 Other persistent atrial fibrillation: Secondary | ICD-10-CM

## 2016-08-19 DIAGNOSIS — I481 Persistent atrial fibrillation: Secondary | ICD-10-CM | POA: Diagnosis not present

## 2016-08-19 NOTE — Patient Instructions (Signed)
Medication Instructions:  Your physician recommends that you continue on your current medications as directed. Please refer to the Current Medication list given to you today.  Labwork: None ordered  Testing/Procedures: None ordered  Follow-Up: Your physician recommends that you schedule a follow-up appointment in: 4 weeks, after your procedure on 08/20/2016, with Roderic Palau in the AFib clinic.  Your physician recommends that you schedule a follow-up appointment in: 3 months, after your procedure on 08/20/2016, with Dr. Curt Bears.   If you need a refill on your cardiac medications before your next appointment, please call your pharmacy.  Thank you for choosing [facility]!!   Trinidad Curet, RN [phone]  Any Other Special Instructions Will Be Listed Below (If Applicable).

## 2016-08-20 ENCOUNTER — Ambulatory Visit (HOSPITAL_COMMUNITY): Payer: Medicare Other | Admitting: Certified Registered Nurse Anesthetist

## 2016-08-20 ENCOUNTER — Encounter (HOSPITAL_COMMUNITY): Payer: Self-pay | Admitting: Certified Registered Nurse Anesthetist

## 2016-08-20 ENCOUNTER — Encounter (HOSPITAL_COMMUNITY)
Admission: RE | Disposition: A | Discharge status: Home / Self Care | Payer: Self-pay | Source: Ambulatory Visit | Attending: Cardiology

## 2016-08-20 ENCOUNTER — Ambulatory Visit (HOSPITAL_COMMUNITY)
Admission: RE | Admit: 2016-08-20 | Discharge: 2016-08-21 | Disposition: A | Discharge status: Home / Self Care | Payer: Medicare Other | Source: Ambulatory Visit | Attending: Cardiology | Admitting: Cardiology

## 2016-08-20 DIAGNOSIS — Z90710 Acquired absence of both cervix and uterus: Secondary | ICD-10-CM | POA: Diagnosis not present

## 2016-08-20 DIAGNOSIS — Z9889 Other specified postprocedural states: Secondary | ICD-10-CM | POA: Diagnosis not present

## 2016-08-20 DIAGNOSIS — I480 Paroxysmal atrial fibrillation: Secondary | ICD-10-CM | POA: Diagnosis not present

## 2016-08-20 DIAGNOSIS — I481 Persistent atrial fibrillation: Secondary | ICD-10-CM | POA: Diagnosis present

## 2016-08-20 DIAGNOSIS — J383 Other diseases of vocal cords: Secondary | ICD-10-CM | POA: Diagnosis not present

## 2016-08-20 DIAGNOSIS — Z7901 Long term (current) use of anticoagulants: Secondary | ICD-10-CM | POA: Insufficient documentation

## 2016-08-20 DIAGNOSIS — Z791 Long term (current) use of non-steroidal anti-inflammatories (NSAID): Secondary | ICD-10-CM | POA: Insufficient documentation

## 2016-08-20 DIAGNOSIS — I4891 Unspecified atrial fibrillation: Secondary | ICD-10-CM | POA: Diagnosis present

## 2016-08-20 DIAGNOSIS — Z888 Allergy status to other drugs, medicaments and biological substances status: Secondary | ICD-10-CM | POA: Diagnosis not present

## 2016-08-20 DIAGNOSIS — Z881 Allergy status to other antibiotic agents status: Secondary | ICD-10-CM | POA: Diagnosis not present

## 2016-08-20 DIAGNOSIS — Z79899 Other long term (current) drug therapy: Secondary | ICD-10-CM | POA: Insufficient documentation

## 2016-08-20 DIAGNOSIS — I10 Essential (primary) hypertension: Secondary | ICD-10-CM | POA: Insufficient documentation

## 2016-08-20 DIAGNOSIS — I471 Supraventricular tachycardia: Secondary | ICD-10-CM | POA: Insufficient documentation

## 2016-08-20 HISTORY — PX: ELECTROPHYSIOLOGIC STUDY: SHX172A

## 2016-08-20 LAB — MRSA PCR SCREENING: MRSA by PCR: NEGATIVE

## 2016-08-20 LAB — POCT ACTIVATED CLOTTING TIME
ACTIVATED CLOTTING TIME: 345 s
ACTIVATED CLOTTING TIME: 323 s
Activated Clotting Time: 400 seconds
Activated Clotting Time: 301 seconds
Activated Clotting Time: 147 seconds

## 2016-08-20 SURGERY — ATRIAL FIBRILLATION ABLATION
Anesthesia: General

## 2016-08-20 MED ORDER — DOBUTAMINE IN D5W 4-5 MG/ML-% IV SOLN
INTRAVENOUS | Status: DC | PRN
  Administered 2016-08-20: 20 ug/kg/min via INTRAVENOUS

## 2016-08-20 MED ORDER — BUPIVACAINE HCL (PF) 0.25 % IJ SOLN
INTRAMUSCULAR | Status: AC
  Filled 2016-08-20: qty 30

## 2016-08-20 MED ORDER — HEPARIN SODIUM (PORCINE) 1000 UNIT/ML IJ SOLN
INTRAMUSCULAR | Status: DC | PRN
  Administered 2016-08-20: 1000 [IU] via INTRAVENOUS

## 2016-08-20 MED ORDER — ONDANSETRON HCL 4 MG/2ML IJ SOLN: 4.0000 mg | Freq: Four times a day (QID) | INTRAMUSCULAR | Status: DC | PRN

## 2016-08-20 MED ORDER — HEPARIN (PORCINE) IN NACL 2-0.9 UNIT/ML-% IJ SOLN
INTRAMUSCULAR | Status: DC | PRN
  Administered 2016-08-20: 12:00:00

## 2016-08-20 MED ORDER — HEPARIN SODIUM (PORCINE) 1000 UNIT/ML IJ SOLN
INTRAMUSCULAR | Status: DC | PRN
  Administered 2016-08-20: 13000 [IU] via INTRAVENOUS

## 2016-08-20 MED ORDER — PROPOFOL 10 MG/ML IV BOLUS
INTRAVENOUS | Status: DC | PRN
  Administered 2016-08-20: 40 mg via INTRAVENOUS
  Administered 2016-08-20: 100 mg via INTRAVENOUS

## 2016-08-20 MED ORDER — SUGAMMADEX SODIUM 200 MG/2ML IV SOLN
INTRAVENOUS | Status: DC | PRN
  Administered 2016-08-20: 150 mg via INTRAVENOUS

## 2016-08-20 MED ORDER — SPIRONOLACTONE 25 MG PO TABS
50.0000 mg | ORAL_TABLET | Freq: Every day | ORAL | Status: DC
  Administered 2016-08-21: 50 mg via ORAL
  Filled 2016-08-20: qty 2

## 2016-08-20 MED ORDER — HEPARIN SODIUM (PORCINE) 1000 UNIT/ML IJ SOLN
INTRAMUSCULAR | Status: AC
  Filled 2016-08-20: qty 1

## 2016-08-20 MED ORDER — HEPARIN (PORCINE) IN NACL 2-0.9 UNIT/ML-% IJ SOLN
INTRAMUSCULAR | Status: AC
  Filled 2016-08-20: qty 500

## 2016-08-20 MED ORDER — INDOMETHACIN 25 MG PO CAPS: 25.0000 mg | ORAL_CAPSULE | Freq: Two times a day (BID) | ORAL | Status: DC | PRN

## 2016-08-20 MED ORDER — ALENDRONATE SODIUM 70 MG PO TABS: 70.0000 mg | ORAL_TABLET | ORAL | Status: DC

## 2016-08-20 MED ORDER — DOBUTAMINE IN D5W 4-5 MG/ML-% IV SOLN
INTRAVENOUS | Status: AC
  Filled 2016-08-20: qty 250

## 2016-08-20 MED ORDER — BUPIVACAINE HCL (PF) 0.25 % IJ SOLN
INTRAMUSCULAR | Status: DC | PRN
  Administered 2016-08-20: 31 mL

## 2016-08-20 MED ORDER — VITAMIN C 500 MG PO TABS
250.0000 mg | ORAL_TABLET | Freq: Every day | ORAL | Status: DC
  Administered 2016-08-21: 250 mg via ORAL
  Filled 2016-08-20: qty 1

## 2016-08-20 MED ORDER — RIVAROXABAN 20 MG PO TABS
20.0000 mg | ORAL_TABLET | Freq: Every day | ORAL | Status: DC
  Administered 2016-08-20: 20 mg via ORAL
  Filled 2016-08-20 (×2): qty 1

## 2016-08-20 MED ORDER — ONDANSETRON HCL 4 MG/2ML IJ SOLN
INTRAMUSCULAR | Status: DC | PRN
  Administered 2016-08-20: 4 mg via INTRAVENOUS

## 2016-08-20 MED ORDER — SODIUM CHLORIDE 0.9% FLUSH: 3.0000 mL | INTRAVENOUS | Status: DC | PRN

## 2016-08-20 MED ORDER — LIDOCAINE HCL (CARDIAC) 20 MG/ML IV SOLN
INTRAVENOUS | Status: DC | PRN
  Administered 2016-08-20: 50 mg via INTRAVENOUS

## 2016-08-20 MED ORDER — SODIUM CHLORIDE 0.9 % IV SOLN: 250.0000 mL | INTRAVENOUS | Status: DC | PRN

## 2016-08-20 MED ORDER — PROTAMINE SULFATE 10 MG/ML IV SOLN
INTRAVENOUS | Status: DC | PRN
  Administered 2016-08-20: 40 mg via INTRAVENOUS

## 2016-08-20 MED ORDER — FENTANYL CITRATE (PF) 100 MCG/2ML IJ SOLN
INTRAMUSCULAR | Status: DC | PRN
  Administered 2016-08-20: 50 ug via INTRAVENOUS
  Administered 2016-08-20: 100 ug via INTRAVENOUS

## 2016-08-20 MED ORDER — METOPROLOL SUCCINATE ER 25 MG PO TB24
25.0000 mg | ORAL_TABLET | Freq: Two times a day (BID) | ORAL | Status: DC
  Administered 2016-08-20: 25 mg via ORAL
  Filled 2016-08-20: qty 1

## 2016-08-20 MED ORDER — GLUCOSAMINE CHOND TRIPLE/VIT D PO TABS: 1.0000 | ORAL_TABLET | Freq: Two times a day (BID) | ORAL | Status: DC

## 2016-08-20 MED ORDER — SODIUM CHLORIDE 0.9 % IV SOLN
INTRAVENOUS | Status: DC | PRN
  Administered 2016-08-20: 12:00:00 via INTRAVENOUS

## 2016-08-20 MED ORDER — PHENYLEPHRINE HCL 10 MG/ML IJ SOLN
INTRAVENOUS | Status: DC | PRN
  Administered 2016-08-20: 20 ug/min via INTRAVENOUS

## 2016-08-20 MED ORDER — ROCURONIUM BROMIDE 100 MG/10ML IV SOLN
INTRAVENOUS | Status: DC | PRN
  Administered 2016-08-20: 20 mg via INTRAVENOUS
  Administered 2016-08-20: 50 mg via INTRAVENOUS
  Administered 2016-08-20: 20 mg via INTRAVENOUS

## 2016-08-20 MED ORDER — ACETAMINOPHEN 325 MG PO TABS: 650.0000 mg | ORAL_TABLET | ORAL | Status: DC | PRN

## 2016-08-20 MED ORDER — SODIUM CHLORIDE 0.9% FLUSH
3.0000 mL | Freq: Two times a day (BID) | INTRAVENOUS | Status: DC
  Administered 2016-08-20: 3 mL via INTRAVENOUS

## 2016-08-20 SURGICAL SUPPLY — 20 items
BAG SNAP BAND KOVER 36X36 (MISCELLANEOUS) ×1 IMPLANT
BLANKET WARM UNDERBOD FULL ACC (MISCELLANEOUS) ×2 IMPLANT
CATH SMTCH THERMOCOOL SF DF (CATHETERS) ×1 IMPLANT
CATH SOUNDSTAR 3D IMAGING (CATHETERS) ×1 IMPLANT
CATH VARIABLE LASSO NAV 2515 (CATHETERS) ×1 IMPLANT
CATH WEBSTER BI DIR CS D-F CRV (CATHETERS) ×2 IMPLANT
COVER SWIFTLINK CONNECTOR (BAG) ×2 IMPLANT
NDL TRANSSEPTAL BRK 98CM (NEEDLE) IMPLANT
NEEDLE TRANSSEPTAL BRK 98CM (NEEDLE) ×2 IMPLANT
PACK EP LATEX FREE (CUSTOM PROCEDURE TRAY) ×2
PACK EP LF (CUSTOM PROCEDURE TRAY) ×1 IMPLANT
PAD DEFIB LIFELINK (PAD) ×2 IMPLANT
PATCH CARTO3 (PAD) ×1 IMPLANT
SHEATH AGILIS NXT 8.5F 71CM (SHEATH) ×2 IMPLANT
SHEATH AVANTI 11F 11CM (SHEATH) ×1 IMPLANT
SHEATH PINNACLE 7F 10CM (SHEATH) ×1 IMPLANT
SHEATH PINNACLE 8F 10CM (SHEATH) ×2 IMPLANT
SHEATH PINNACLE 9F 10CM (SHEATH) ×2 IMPLANT
SHIELD RADPAD SCOOP 12X17 (MISCELLANEOUS) ×2 IMPLANT
TUBING SMART ABLATE COOLFLOW (TUBING) ×1 IMPLANT

## 2016-08-20 NOTE — Progress Notes (Signed)
PHARMACIST - PHYSICIAN ORDER COMMUNICATION  CONCERNING: P&T Medication Policy on Herbal Medications  DESCRIPTION:  This patient's order for:  Glucosamine/Vit D  has been noted.  This product(s) is classified as an "herbal" or natural product. Due to a lack of definitive safety studies or FDA approval, nonstandard manufacturing practices, plus the potential risk of unknown drug-drug interactions while on inpatient medications, the Pharmacy and Therapeutics Committee does not permit the use of "herbal" or natural products of this type within [facility].   ACTION TAKEN: The pharmacy department is unable to verify this order at this time and your patient has been informed of this safety policy. Please reevaluate patient's clinical condition at discharge and address if the herbal or natural product(s) should be resumed at that time.   CONCERNING: P&T Medication Policy Regarding Oral Bisphosphonates  RECOMMENDATION: Your order for alendronate (Fosamax), ibandronate (Boniva), or risedronate (Actonel) has been discontinued at this time.  If the patient's post-hospital medical condition warrants safe use of this class of drugs, please resume the pre-hospital regimen upon discharge.  DESCRIPTION:  Alendronate (Fosamax), ibandronate (Boniva), and risedronate (Actonel) can cause severe esophageal erosions in patients who are unable to remain upright at least 30 minutes after taking this medication.   Since brief interruptions in therapy are thought to have minimal impact on bone mineral density, the [facility] has established that bisphosphonate orders should be routinely discontinued during hospitalization.   To override this safety policy and permit administration of Boniva, Fosamax, or Actonel in the hospital, prescribers must write "DO NOT HOLD" in the comments section when placing the order for this class of medications.  Harvel Quale  08/20/2016 6:35 PM

## 2016-08-20 NOTE — Progress Notes (Addendum)
Site area: Left groin a 7 and 11 french venous sheath was removed  Site Prior to Removal:  Level 0  Pressure Applied For 15 MINUTES    Bedrest Beginning at 1740p  Manual:   Yes.    Patient Status During Pull:  stable  Post Pull Groin Site:  Level 0  Post Pull Instructions Given:  Yes.    Post Pull Pulses Present:  Yes.    Dressing Applied:  Yes.    Comments: VS remain stable during sheath pull

## 2016-08-20 NOTE — H&P (Signed)
Yolanda Bell presents to the hospital with a history of atrial fibrillation.  She is planned for ablation for her AF.  On exam, regular rhythm, no murmurs, lungs clear.  Risks and benefits discussed.  Risks include but not limited to bleeding, tamponade, heart block stroke, and damage to surrounding organs.  She understands these risks and has agreed to the procedure.  Chanze Teagle Curt Bears, MD 08/20/2016 11:48 AM

## 2016-08-20 NOTE — Anesthesia Procedure Notes (Signed)
Procedure Name: Intubation Performed by: WHITE, Rodd Heft TENA Lou Irigoyen Pre-anesthesia Checklist: Patient identified, Suction available, Emergency Drugs available, Patient being monitored and Timeout performed Patient Re-evaluated:Patient Re-evaluated prior to inductionOxygen Delivery Method: Circle system utilized and Simple face mask Preoxygenation: Pre-oxygenation with 100% oxygen Intubation Type: IV induction Ventilation: Mask ventilation without difficulty Laryngoscope Size: Miller and 2 (First attempt MAC 3 Grade 4 view by CRNA) Grade View: Grade II Tube type: Oral Tube size: 7.0 mm Number of attempts: 2 Airway Equipment and Method: Stylet and Bougie stylet Placement Confirmation: ETT inserted through vocal cords under direct vision,  positive ETCO2 and breath sounds checked- equal and bilateral Secured at: 21 cm Tube secured with: Tape Dental Injury: Teeth and Oropharynx as per pre-operative assessment  Comments: DL x 1 by CRNA with grade 4 view, Subsequent grade 2 view with Miller 2 and placement of a bougie through cords. ETT advanced over bougie without difficulty and visualized passage through cords.

## 2016-08-20 NOTE — Anesthesia Preprocedure Evaluation (Signed)
Anesthesia Evaluation  Patient identified by MRN, date of birth, ID band Patient awake    Reviewed: Allergy & Precautions, NPO status , Patient's Chart, lab work & pertinent test results  History of Anesthesia Complications Negative for: history of anesthetic complications  Airway Mallampati: III  TM Distance: <3 FB Neck ROM: Full    Dental  (+) Teeth Intact   Pulmonary neg pulmonary ROS,    breath sounds clear to auscultation       Cardiovascular hypertension, Pt. on medications and Pt. on home beta blockers + dysrhythmias Atrial Fibrillation  Rhythm:Regular     Neuro/Psych negative neurological ROS  negative psych ROS   GI/Hepatic negative GI ROS, Neg liver ROS,   Endo/Other  negative endocrine ROS  Renal/GU negative Renal ROS     Musculoskeletal   Abdominal   Peds  Hematology negative hematology ROS (+)   Anesthesia Other Findings   Reproductive/Obstetrics                             Anesthesia Physical Anesthesia Plan  ASA: II  Anesthesia Plan: General   Post-op Pain Management:    Induction: Intravenous  Airway Management Planned: Oral ETT  Additional Equipment: None  Intra-op Plan:   Post-operative Plan: Extubation in OR  Informed Consent: I have reviewed the patients History and Physical, chart, labs and discussed the procedure including the risks, benefits and alternatives for the proposed anesthesia with the patient or authorized representative who has indicated his/her understanding and acceptance.   Dental advisory given  Plan Discussed with: CRNA and Surgeon  Anesthesia Plan Comments:         Anesthesia Quick Evaluation

## 2016-08-20 NOTE — Discharge Summary (Signed)
ELECTROPHYSIOLOGY PROCEDURE DISCHARGE SUMMARY    Patient ID: Yolanda Bell,  MRN: [id number], DOB/AGE: 1941-01-07 75 y.o.  Admit date: 08/20/2016 Discharge date: 08/21/2016  Primary Care Physician: Gennette Pac, MD  Primary Cardiologist: Dr. Acie Fredrickson Electrophysiologist: Dr. Curt Bears  Primary Discharge Diagnosis:  1. Persistent AFib     CHA2DS2Vasc is at least 3, on Xarelto  Secondary Discharge Diagnosis:  1. HTN 2. Remote pericardial stripping secondary to constrictive pericarditis 3. Hx of SVT previously ablated 4. Spastic dysphonia  Procedures This Admission:  1.  Electrophysiology study and radiofrequency catheter ablation on 08/20/16 by Dr Curt Bears.   This study demonstrated  CONCLUSIONS: 1. Sinus rhythm upon presentation.   2. Successful electrical isolation and anatomical encircling of all four pulmonary veins with radiofrequency current. 3. No inducible arrhythmias following ablation both on and off of dobutamine 4. No early apparent complications.  Brief HPI: Yolanda Bell is a 75 y.o. female with a history of persistent atrial fibrillation.  Risks, benefits, and alternatives to catheter ablation of atrial fibrillation were reviewed with the patient who wished to proceed.  The patient underwent Cardiac CT prior to the procedure which demonstrated  no LAA thrombus.    Hospital Course:  The patient was admitted and underwent EPS/RFCA of atrial fibrillation with details as outlined above.  They were monitored on telemetry overnight which demonstrated SR and some accelerated junctional- BB was increased.  Bilateral Groins was without complication on the day of discharge.  The patient was examined by Dr. Curt Bears and considered to be stable for discharge.  Wound care and restrictions were reviewed with the patient.  The patient will be seen back by Roderic Palau, NP in 4 weeks and Dr Curt Bears in 12 weeks for post ablation follow up.  In past with higher doses of BB pt  became bradycardic, she will call if this occurs.     Physical Exam: Vitals:   08/21/16 0000 08/21/16 0100 08/21/16 0411 08/21/16 0748  BP: (!) 111/49 (!) 94/47 (!) 135/59 (!) 108/56  Pulse: 81 72 (!) 58 80  Resp: 17 17 15 13   Temp:   97.8 F (36.6 C) 97.8 F (36.6 C)  TempSrc:   Oral Oral  SpO2: 97% 94% 97% 99%  Weight:      Height:        GEN- The patient is well appearing, alert and oriented x 3 today.   HEENT: normocephalic, atraumatic; sclera clear, conjunctiva pink; hearing intact; hoarseness to voice neck supple  Lungs- Clear to ausculation bilaterally, normal work of breathing.  No wheezes, rales, rhonchi Heart- Regular rate and rhythm, no murmurs, rubs or gallops  GI- soft, non-tender, non-distended, bowel sounds present  Extremities- no clubbing, cyanosis, or edema; DP/PT/radial pulses 2+ bilaterally, groin without hematoma/bruit MS- no significant deformity or atrophy- burning heels but no blisters or redness and no pain to palpation. Skin- warm and dry, no rash or lesion Psych- euthymic mood, full affect Neuro- A&O X 3, MAE, + facial symmetry   Labs:   Lab Results  Component Value Date   WBC 5.4 08/12/2016   HGB 12.6 08/12/2016   HCT 36.7 08/12/2016   MCV 84.4 08/12/2016   PLT 172 08/12/2016   No results for input(s): NA, K, CL, CO2, BUN, CREATININE, CALCIUM, PROT, BILITOT, ALKPHOS, ALT, AST, GLUCOSE in the last 168 hours.  Invalid input(s): LABALBU   Discharge Medications:    Medication List    STOP taking these medications   indomethacin 25 MG capsule  Commonly known as:  INDOCIN     TAKE these medications   acetaminophen 325 MG tablet Commonly known as:  TYLENOL Take 2 tablets (650 mg total) by mouth every 4 (four) hours as needed for headache or mild pain.   alendronate 70 MG tablet Commonly known as:  FOSAMAX Take 70 mg by mouth once a week. On Saturday or Sunday   celecoxib 200 MG capsule Commonly known as:  CELEBREX Take 200 mg by  mouth daily as needed. As needed for gout flare ups   GLUCOSAMINE CHOND TRIPLE/VIT D Tabs Take 1 tablet by mouth 2 (two) times daily.   metoprolol succinate 50 MG 24 hr tablet Commonly known as:  TOPROL-XL Take 1 tablet (50 mg total) by mouth 2 (two) times daily. Take with or immediately following a meal. What changed:  how much to take  additional instructions   potassium chloride 10 MEQ tablet Commonly known as:  KLOR-CON M10 TAKE 1 TABLET (10 MEQ TOTAL) BY MOUTH DAILY.   spironolactone 50 MG tablet Commonly known as:  ALDACTONE Take 50 mg by mouth daily.   torsemide 20 MG tablet Commonly known as:  DEMADEX TAKE 1 TABLET (20 MG TOTAL) BY MOUTH DAILY.   VITAMIN C PO Take 2 tablets by mouth daily.   XARELTO 20 MG Tabs tablet Generic drug:  rivaroxaban TAKE 1 TABLET BY MOUTH EVERY DAY WITH SUPPER       Disposition:  Home  Follow-up Information    [facility] Follow up on 09/21/2016.   Specialty:  Cardiology Why:  11:00AM Contact information: [address] [id number] [place] [phone]       [name], MD Follow up on 11/22/2016.   Specialty:  Cardiology Why:  9:45AM Contact information: [address] [place] [address] [phone]          DISCHARGE INSTRUCTIONS: No driving for 1 week. No lifting over 5 lbs for 1 week. No vigorous or sexual activity for 1 week. You may return to work on 08/27/16. Keep procedure site clean & dry. If you notice increased pain, swelling, bleeding or pus, call/return!  You may shower, but no soaking baths/hot tubs/pools for 1 week.   Duration of Discharge Encounter: Greater than 30 minutes including physician time.  Signed, Cecilie Kicks, FNP-C [facility]  [phone] or after 5pm and on weekends call [phone] 08/21/2016   08/21/2016 9:56 AM  I have seen and examined this patient with Cecilie Kicks.  Agree with above, note added to reflect my findings.  On exam, regular rhythm, no murmurs, lungs clear. Had AF ablation yesterday without issues.  In and out of accelerated junctional rhythm today.  Will increase beta blockers and plan for discharge with follow up in AF clinic in one month.    Will M. Camnitz MD 08/21/2016 10:33 AM

## 2016-08-20 NOTE — Discharge Instructions (Signed)
No driving for 1 week. No lifting over 5 lbs for 1 week. No vigorous or sexual activity for 1 week. You may return to work on 08/27/16. Keep procedure site clean & dry. If you notice increased pain, swelling, bleeding or pus, call/return!  You may shower, but no soaking baths/hot tubs/pools for 1 week.    ---------------------   You have an appointment set up with the [facility].  Multiple studies have shown that being followed by a dedicated atrial fibrillation clinic in addition to the standard care you receive from your other physicians improves health. We believe that enrollment in the atrial fibrillation clinic will allow [place] to better care for you.   The phone number to the [facility] is [phone]. The clinic is staffed Monday through Friday from 8:30am to 5pm.  Parking Directions: The clinic is located in the Heart and Vascular Building connected to [facility]. 1)From [address] turn on to [address] and go to the 3rd entrance  (Heart and Vascular entrance) on the right. 2)Look to the right for Heart &Vascular Parking Garage. 3)A code for the entrance is required please call the clinic to receive this.   4)Take the elevators to the 1st floor. Registration is in the room with the glass walls at the end of the hallway.  If you have any trouble parking or locating the clinic, please dont hesitate to call [phone].

## 2016-08-20 NOTE — Progress Notes (Addendum)
Site area: Right groin a 9 french X2 venous Sheath was removed  Site Prior to Removal:  Level 0  Pressure Applied For 15 MINUTES    Bedrest Beginning at 1740p  Manual:   Yes.    Patient Status During Pull:  stable  Post Pull Groin Site:  Level 0  Post Pull Instructions Given:  Yes.    Post Pull Pulses Present:  Yes.    Dressing Applied:  Yes.    Comments:  VS remain stable during sheath pull

## 2016-08-20 NOTE — Transfer of Care (Signed)
Immediate Anesthesia Transfer of Care Note  Patient: Yolanda Bell  Procedure(s) Performed: Procedure(s): Atrial Fibrillation Ablation (N/A)  Patient Location: PACU  Anesthesia Type:General  Level of Consciousness: awake, alert , oriented and patient cooperative  Airway & Oxygen Therapy: Patient Spontanous Breathing and Patient connected to nasal cannula oxygen  Post-op Assessment: Report given to RN, Post -op Vital signs reviewed and stable and Patient moving all extremities  Post vital signs: Reviewed and stable  Last Vitals:  Vitals:   08/20/16 1026  BP: 130/70  Pulse: (!) 57  Temp: 36.8 C    Last Pain:  Vitals:   08/20/16 1026  TempSrc: Oral         Complications: No apparent anesthesia complications

## 2016-08-21 DIAGNOSIS — I481 Persistent atrial fibrillation: Secondary | ICD-10-CM | POA: Diagnosis not present

## 2016-08-21 DIAGNOSIS — J383 Other diseases of vocal cords: Secondary | ICD-10-CM | POA: Diagnosis not present

## 2016-08-21 DIAGNOSIS — I471 Supraventricular tachycardia: Secondary | ICD-10-CM | POA: Diagnosis not present

## 2016-08-21 DIAGNOSIS — I10 Essential (primary) hypertension: Secondary | ICD-10-CM | POA: Diagnosis not present

## 2016-08-21 MED ORDER — ACETAMINOPHEN 325 MG PO TABS: 650.0000 mg | ORAL_TABLET | ORAL | Status: DC | PRN

## 2016-08-21 MED ORDER — METOPROLOL SUCCINATE ER 50 MG PO TB24: 50.0000 mg | ORAL_TABLET | Freq: Two times a day (BID) | ORAL | 6 refills | Status: DC

## 2016-08-21 MED ORDER — METOPROLOL SUCCINATE ER 50 MG PO TB24
50.0000 mg | ORAL_TABLET | Freq: Two times a day (BID) | ORAL | Status: DC
  Administered 2016-08-21: 50 mg via ORAL
  Filled 2016-08-21: qty 1

## 2016-08-22 NOTE — Anesthesia Postprocedure Evaluation (Signed)
Anesthesia Post Note  Patient: Yolanda Bell  Procedure(s) Performed: Procedure(s) (LRB): Atrial Fibrillation Ablation (N/A)  Patient location during evaluation: PACU Anesthesia Type: General Level of consciousness: sedated Pain management: pain level controlled Vital Signs Assessment: post-procedure vital signs reviewed and stable Respiratory status: spontaneous breathing and respiratory function stable Cardiovascular status: stable Anesthetic complications: no                 Snow Peoples DANIEL

## 2016-08-23 ENCOUNTER — Encounter (HOSPITAL_COMMUNITY): Payer: Self-pay | Admitting: Cardiology

## 2016-09-21 ENCOUNTER — Ambulatory Visit (HOSPITAL_COMMUNITY)
Admission: RE | Admit: 2016-09-21 | Discharge: 2016-09-21 | Disposition: A | Discharge status: Home / Self Care | Payer: Medicare Other | Source: Ambulatory Visit | Attending: Nurse Practitioner | Admitting: Nurse Practitioner

## 2016-09-21 ENCOUNTER — Encounter (HOSPITAL_COMMUNITY): Payer: Self-pay | Admitting: Nurse Practitioner

## 2016-09-21 VITALS — BP 126/84 | HR 70 | Ht 64.0 in | Wt 167.0 lb

## 2016-09-21 DIAGNOSIS — I481 Persistent atrial fibrillation: Secondary | ICD-10-CM

## 2016-09-21 DIAGNOSIS — M109 Gout, unspecified: Secondary | ICD-10-CM | POA: Insufficient documentation

## 2016-09-21 DIAGNOSIS — I10 Essential (primary) hypertension: Secondary | ICD-10-CM | POA: Insufficient documentation

## 2016-09-21 DIAGNOSIS — I471 Supraventricular tachycardia: Secondary | ICD-10-CM | POA: Diagnosis not present

## 2016-09-21 DIAGNOSIS — Z79899 Other long term (current) drug therapy: Secondary | ICD-10-CM | POA: Insufficient documentation

## 2016-09-21 DIAGNOSIS — Z7901 Long term (current) use of anticoagulants: Secondary | ICD-10-CM | POA: Diagnosis not present

## 2016-09-21 DIAGNOSIS — I4819 Other persistent atrial fibrillation: Secondary | ICD-10-CM

## 2016-09-21 MED ORDER — METOPROLOL SUCCINATE ER 50 MG PO TB24: ORAL_TABLET | ORAL | 6 refills | Status: DC

## 2016-09-21 NOTE — Patient Instructions (Signed)
Your physician has recommended you make the following change in your medication:  1)Decrease metoprolol to 25mg twice a day  

## 2016-09-21 NOTE — Progress Notes (Signed)
Primary Care Physician: Gennette Pac, MD Referring Physician: Dr. Hurshel Party Yolanda Bell is a 75 y.o. female with a h/o persistent afib  that is in the afib clinic for f/u of ablation 08/19/16. She reports that she has done extremely well since procedure without any noted afib. Some bruising present at the rt groin but cleared in one week.  She would like to reduce BB back to pre procedure dose which was 25 mg bid. It was increased due to some junctional tachycardia right after procedure. No swallowing issues.  Today, she denies symptoms of palpitations, chest pain, shortness of breath, orthopnea, PND, lower extremity edema, dizziness, presyncope, syncope, or neurologic sequela. The patient is tolerating medications without difficulties and is otherwise without complaint today.   Past Medical History:  Diagnosis Date  . Atrial fibrillation ([place])    Status post TEE cardioversion  . Constrictive pericarditis    2 status post [facility]ardial stripping at the [facility]  . Gout   . Hypertension   . Spastic dysphonia   . Supraventricular tachycardia ([place])    Status post RF ablation by Dr. Peterson Lombard   Past Surgical History:  Procedure Laterality Date  . ABDOMINAL HYSTERECTOMY     1987  . CARDIOVERSION  11/21/2012   Procedure: CARDIOVERSION;  Surgeon: Darlin Coco, MD;  Location: [facility];  Service: Cardiovascular;;  . CARDIOVERSION N/A 06/10/2014   Procedure: CARDIOVERSION;  Surgeon: Thayer Headings, MD;  Location: [facility] ENDOSCOPY;  Service: Cardiovascular;  Laterality: N/A;  . CARDIOVERSION N/A 10/14/2015   Procedure: CARDIOVERSION;  Surgeon: Josue Hector, MD;  Location: [facility] ENDOSCOPY;  Service: Cardiovascular;  Laterality: N/A;  . CARDIOVERSION N/A 05/12/2016   Procedure: CARDIOVERSION;  Surgeon: Skeet Latch, MD;  Location: [facility] ENDOSCOPY;  Service: Cardiovascular;  Laterality: N/A;  . CHOLECYSTECTOMY     1989  . ELECTROPHYSIOLOGIC STUDY N/A 08/20/2016   Procedure: Atrial  Fibrillation Ablation;  Surgeon: [name], MD;  Location: [facility] CV LAB;  Service: Cardiovascular;  Laterality: N/A;  . EYE SURGERY     cataracts  . PERICARDIECTOMY    . TEE WITHOUT CARDIOVERSION N/A 06/10/2014   Procedure: TRANSESOPHAGEAL ECHOCARDIOGRAM (TEE);  Surgeon: Thayer Headings, MD;  Location: [facility];  Service: Cardiovascular;  Laterality: N/A;  . TONSILLECTOMY      Current Outpatient Prescriptions  Medication Sig Dispense Refill  . alendronate (FOSAMAX) 70 MG tablet Take 70 mg by mouth once a week. On Saturday or Sunday  4  . Ascorbic Acid (VITAMIN C PO) Take 2 tablets by mouth daily.    . Gluc-Chonn-MSM-Boswellia-Vit D (GLUCOSAMINE CHOND TRIPLE/VIT D) TABS Take 1 tablet by mouth 2 (two) times daily.    . indomethacin (INDOCIN) 25 MG capsule Take 25 mg by mouth 2 (two) times daily with a meal.    . metoprolol succinate (TOPROL-XL) 50 MG 24 hr tablet Take 1/2 tablet (25mg ) by mouth twice a day 60 tablet 6  . potassium chloride (KLOR-CON M10) 10 MEQ tablet TAKE 1 TABLET (10 MEQ TOTAL) BY MOUTH DAILY. 90 tablet 3  . spironolactone (ALDACTONE) 50 MG tablet Take 50 mg by mouth daily.    Marland Kitchen torsemide (DEMADEX) 20 MG tablet TAKE 1 TABLET (20 MG TOTAL) BY MOUTH DAILY. 90 tablet 3  . XARELTO 20 MG TABS tablet TAKE 1 TABLET BY MOUTH EVERY DAY WITH SUPPER 90 tablet 2  . acetaminophen (TYLENOL) 325 MG tablet Take 2 tablets (650 mg total) by mouth every 4 (four) hours  as needed for headache or mild pain. (Patient not taking: Reported on 09/21/2016)     No current facility-administered medications for this encounter.     Allergies  Allergen Reactions  . Allopurinol     More severe gout   . Amiodarone Other (See Comments)    Dizziness, halucinations  . Other Other (See Comments)    Cannot take STEROIDS-unknown reaction-caused by internal scarring on liver.    Anesthesia precaution due to excessive liver scarring   . Erythromycin Nausea And Vomiting and Rash     Social History   Social History  . Marital status: Divorced    Spouse name: N/A  . Number of children: N/A  . Years of education: N/A   Occupational History  . Not on file.   Social History Main Topics  . Smoking status: Never Smoker  . Smokeless tobacco: Never Used  . Alcohol use Yes     Comment: occasionally  . Drug use: No  . Sexual activity: Not on file   Other Topics Concern  . Not on file   Social History Narrative  . No narrative on file    Family History  Problem Relation Age of Onset  . Heart disease Mother   . Emphysema Father   . Breast cancer Sister     ROS- All systems are reviewed and negative except as per the HPI above  Physical Exam: Vitals:   09/21/16 1109  BP: 126/84  Pulse: 70  Weight: 167 lb (75.8 kg)  Height: 5\' 4"  (1.626 m)   Wt Readings from Last 3 Encounters:  09/21/16 167 lb (75.8 kg)  08/20/16 165 lb (74.8 kg)  08/19/16 165 lb 12.8 oz (75.2 kg)    Labs: Lab Results  Component Value Date   NA 141 08/12/2016   K 3.6 08/12/2016   CL 103 08/12/2016   CO2 27 08/12/2016   GLUCOSE 91 08/12/2016   BUN 19 08/12/2016   CREATININE 0.90 08/12/2016   CALCIUM 9.3 08/12/2016   MG 1.8 05/11/2016   Lab Results  Component Value Date   INR 1.30 10/08/2015   Lab Results  Component Value Date   CHOL 158 08/18/2015   HDL 47 08/18/2015   LDLCALC 94 08/18/2015   TRIG 83 08/18/2015     GEN- The patient is well appearing, alert and oriented x 3 today.   Head- normocephalic, atraumatic Eyes-  Sclera clear, conjunctiva pink Ears- hearing intact Oropharynx- clear Neck- supple, no JVP Lymph- no cervical lymphadenopathy Lungs- Clear to ausculation bilaterally, normal work of breathing Heart- Regular rate and rhythm, no murmurs, rubs or gallops, PMI not laterally displaced GI- soft, NT, ND, + BS Extremities- no clubbing, cyanosis, or edema MS- no significant deformity or atrophy Skin- no rash or lesion Psych- euthymic mood, full  affect Neuro- strength and sensation are intact  EKG-SR with marked sinus arrhythmia. Pr int 154 ms, qrs int 86 ms, qtc 438 ms Epic records reviewed   Assessment and Plan: 1. Afib s/p ablation Is doing very well without afib occurrence  Go back to metoprolol 25 mg bid but if notices increase of palpitations increase dose back to 50 mg bid Continue xarelto  F/u with Dr. Curt Bears 2/12 afib clinic as needed  Butch Penny C. Miyanna Wiersma, [facility] [address] [place], [place] [address] [phone]

## 2016-09-29 ENCOUNTER — Other Ambulatory Visit: Payer: Self-pay | Admitting: Cardiovascular Disease

## 2016-11-22 ENCOUNTER — Encounter (INDEPENDENT_AMBULATORY_CARE_PROVIDER_SITE_OTHER): Payer: Self-pay

## 2016-11-22 ENCOUNTER — Encounter: Payer: Self-pay | Admitting: Cardiology

## 2016-11-22 ENCOUNTER — Ambulatory Visit (INDEPENDENT_AMBULATORY_CARE_PROVIDER_SITE_OTHER): Payer: Medicare Other | Admitting: Cardiology

## 2016-11-22 VITALS — BP 116/72 | HR 69 | Ht 64.0 in | Wt 167.2 lb

## 2016-11-22 DIAGNOSIS — Z9889 Other specified postprocedural states: Secondary | ICD-10-CM

## 2016-11-22 DIAGNOSIS — Z8679 Personal history of other diseases of the circulatory system: Secondary | ICD-10-CM | POA: Diagnosis not present

## 2016-11-22 DIAGNOSIS — I481 Persistent atrial fibrillation: Secondary | ICD-10-CM

## 2016-11-22 DIAGNOSIS — I4819 Other persistent atrial fibrillation: Secondary | ICD-10-CM

## 2016-11-22 NOTE — Patient Instructions (Addendum)
Medication Instructions:    Your physician recommends that you continue on your current medications as directed. Please refer to the Current Medication list given to you today.  --- If you need a refill on your cardiac medications before your next appointment, please call your pharmacy. ---  Labwork:  None ordered  Testing/Procedures:  None ordered  Follow-Up:  Your physician recommends that you schedule a follow-up appointment in: 3 months with Dr. Curt Bears.   Any Other Special Instructions Will Be Listed Below (If Applicable).  Please call your insurance to discuss ablation cost/coverage.  Hospital billing number is [id number], Option 1.   Thank you for choosing [facility]!!   Trinidad Curet, RN [phone]

## 2016-11-22 NOTE — Progress Notes (Signed)
Electrophysiology Office Note   Date:  11/22/2016   ID:  Kayler, Peery 1941/08/04, MRN [id number]  PCP:  Gennette Pac, MD  Cardiologist:  Nahser Primary Electrophysiologist:  Jelicia Nantz Meredith Leeds, MD    Chief Complaint  Patient presents with  . Follow-up    Persistent Afib     History of Present Illness: Yolanda Bell is a 76 y.o. female who presents today for electrophysiology evaluation.   She has PMH of constrictive pericarditis s/p pericardial stripping at [facility] 2006, SVT s/p RFA by Dr. Caryl Comes, HTN, gout and afib/aflutter currently on Xarelto. She previously underwent DCCV in Feb 2014 however had recurrence since. She most recently underwent TEE DCCV on 06/10/2014. TEE on on that day showed EF 45-50%, mild to moderate TR, moderate MR. Patient has cardiac awareness of afib with chest pressure and palpitation. Had repeat cardioversion 05/12/16. AF ablation 08/20/16.She has had no episodes of palpitations or shortness of breath since her ablation. Unfortunately, she is having insurance issues with pain for her ablation.  Today, she denies symptoms of palpitations, chest pain, shortness of breath, orthopnea, PND, lower extremity edema, claudication, dizziness, presyncope, syncope, bleeding, or neurologic sequela. The patient is tolerating medications without difficulties and is otherwise without complaint today.     Past Medical History:  Diagnosis Date  . Atrial fibrillation ([place])    Status post TEE cardioversion  . Constrictive pericarditis    2 status post pericardial stripping at the [facility]  . Gout   . Hypertension   . Spastic dysphonia   . Supraventricular tachycardia ([place])    Status post RF ablation by Dr. Peterson Lombard   Past Surgical History:  Procedure Laterality Date  . ABDOMINAL HYSTERECTOMY     1987  . CARDIOVERSION  11/21/2012   Procedure: CARDIOVERSION;  Surgeon: Darlin Coco, MD;  Location: [place];  Service: Cardiovascular;;  .  CARDIOVERSION N/A 06/10/2014   Procedure: CARDIOVERSION;  Surgeon: Thayer Headings, MD;  Location: [facility] ENDOSCOPY;  Service: Cardiovascular;  Laterality: N/A;  . CARDIOVERSION N/A 10/14/2015   Procedure: CARDIOVERSION;  Surgeon: Josue Hector, MD;  Location: [facility] ENDOSCOPY;  Service: Cardiovascular;  Laterality: N/A;  . CARDIOVERSION N/A 05/12/2016   Procedure: CARDIOVERSION;  Surgeon: Skeet Latch, MD;  Location: [facility] ENDOSCOPY;  Service: Cardiovascular;  Laterality: N/A;  . CHOLECYSTECTOMY     1989  . ELECTROPHYSIOLOGIC STUDY N/A 08/20/2016   Procedure: Atrial Fibrillation Ablation;  Surgeon: Sarabeth Benton Meredith Leeds, MD;  Location: [place] CV LAB;  Service: Cardiovascular;  Laterality: N/A;  . EYE SURGERY     cataracts  . PERICARDIECTOMY    . TEE WITHOUT CARDIOVERSION N/A 06/10/2014   Procedure: TRANSESOPHAGEAL ECHOCARDIOGRAM (TEE);  Surgeon: Thayer Headings, MD;  Location: [place];  Service: Cardiovascular;  Laterality: N/A;  . TONSILLECTOMY       Current Outpatient Prescriptions  Medication Sig Dispense Refill  . alendronate (FOSAMAX) 70 MG tablet Take 70 mg by mouth once a week. On Saturday or Sunday  4  . Ascorbic Acid (VITAMIN C PO) Take 1 tablet by mouth daily.     . Gluc-Chonn-MSM-Boswellia-Vit D (GLUCOSAMINE CHOND TRIPLE/VIT D) TABS Take 1 tablet by mouth daily.     . indomethacin (INDOCIN) 25 MG capsule Take 25 mg by mouth as needed. For gout    . metoprolol succinate (TOPROL-XL) 50 MG 24 hr tablet Take 1/2 tablet (25mg ) by mouth twice a day 60 tablet 6  . potassium chloride (KLOR-CON M10) 10 MEQ tablet  TAKE 1 TABLET (10 MEQ TOTAL) BY MOUTH DAILY. 90 tablet 3  . spironolactone (ALDACTONE) 50 MG tablet Take 50 mg by mouth daily.    Marland Kitchen torsemide (DEMADEX) 20 MG tablet TAKE 1 TABLET (20 MG TOTAL) BY MOUTH DAILY. 90 tablet 3  . XARELTO 20 MG TABS tablet TAKE 1 TABLET BY MOUTH EVERY DAY WITH SUPPER 90 tablet 2   No current facility-administered medications for this visit.      Allergies:   Allopurinol; Amiodarone; Other; and Erythromycin   Social History:  The patient  reports that she has never smoked. She has never used smokeless tobacco. She reports that she drinks alcohol. She reports that she does not use drugs.   Family History:  The patient's family history includes Breast cancer in her sister; Emphysema in her father; Heart disease in her mother.    ROS:  Please see the history of present illness.   Otherwise, review of systems is positive for none.   All other systems are reviewed and negative.    PHYSICAL EXAM: VS:  There were no vitals taken for this visit. , BMI There is no height or weight on file to calculate BMI. GEN: Well nourished, well developed, in no acute distress  HEENT: normal  Neck: no JVD, carotid bruits, or masses Cardiac: RRR; no murmurs, rubs, or gallops,no edema  Respiratory:  clear to auscultation bilaterally, normal work of breathing GI: soft, nontender, nondistended, + BS MS: no deformity or atrophy  Skin: warm and dry Neuro:  Strength and sensation are intact Psych: euthymic mood, full affect  EKG:  EKG is ordered today. Personal review of the ekg ordered today shows sinus rhythm, rate 69   Recent Labs: 05/11/2016: ALT 14; Magnesium 1.8; TSH 3.188 08/12/2016: BUN 19; Creat 0.90; Hemoglobin 12.6; Platelets 172; Potassium 3.6; Sodium 141    Lipid Panel     Component Value Date/Time   CHOL 158 08/18/2015 1147   TRIG 83 08/18/2015 1147   HDL 47 08/18/2015 1147   CHOLHDL 3.4 08/18/2015 1147   VLDL 17 08/18/2015 1147   LDLCALC 94 08/18/2015 1147     Wt Readings from Last 3 Encounters:  09/21/16 167 lb (75.8 kg)  08/20/16 165 lb (74.8 kg)  08/19/16 165 lb 12.8 oz (75.2 kg)      Other studies Reviewed: Additional studies/ records that were reviewed today include: TEE/cardioversion Review of the above records today demonstrates:  - Left ventricle: Systolic function was mildly reduced. The estimated  ejection fraction was in the range of 45% to 50%. No evidence of thrombus. - Mitral valve: There was moderate regurgitation. - Left atrium: No evidence of thrombus in the atrial cavity or appendage. - Tricuspid valve: There was mild-moderate regurgitation.  Impressions:  - Successful cardioversion. No cardiac source of emboli was indentified.   TTE 05/18/16 - Low normal LV systolic function; grade 1 diastolic dysfunction;   mild MR; mild LAE.  ASSESSMENT AND PLAN:  1.  Persistent atrial fibrillation: on Xarelto; ablation 08/20/16.  She is in sinus rhythm today. Feeling well without any major issues. Continue current medical regimen.  This patients CHA2DS2-VASc Score and unadjusted Ischemic Stroke Rate (% per year) is equal to 3.2 % stroke rate/year from a score of 3  Above score calculated as 1 point each if present [CHF, HTN, DM, Vascular=MI/PAD/Aortic Plaque, Age if 65-74, or Female] Above score calculated as 2 points each if present [Age > 75, or Stroke/TIA/TE]  2. constrictive pericarditis s/p pericardial stripping at  [facility] 2006 3. h/o SVT s/p RFA by Dr. Caryl Comes 4. HTN: increase metoprolol XL to BID Exam his weight has been   Current medicines are reviewed at length with the patient today.   The patient does not have concerns regarding her medicines.  The following changes were made today:  none  Labs/ tests ordered today include:  No orders of the defined types were placed in this encounter.    Disposition:   FU with Ernestine Rohman 6 months  Signed, Fern Asmar Meredith Leeds, MD  11/22/2016 9:48 AM     [facility] [address] [place] [address] [phone] (office) [phone] (fax)

## 2016-12-26 ENCOUNTER — Other Ambulatory Visit: Payer: Self-pay | Admitting: Cardiovascular Disease

## 2017-02-22 ENCOUNTER — Ambulatory Visit: Payer: Medicare Other | Admitting: Cardiology

## 2017-04-12 ENCOUNTER — Other Ambulatory Visit: Payer: Self-pay | Admitting: Cardiovascular Disease

## 2017-05-08 ENCOUNTER — Other Ambulatory Visit: Payer: Self-pay | Admitting: Cardiovascular Disease

## 2017-05-09 NOTE — Telephone Encounter (Signed)
Age 76 years Wt 75.8kg 11/22/2016 Saw Dr Curt Bears on 11/22/2016 08/12/2016 Hgb 12.6 HCT 36.7 08/12/2016  SrCr 0.90 CrCl 65.6 Refill done for Xarelto 20 mg daily as requested

## 2017-05-18 ENCOUNTER — Ambulatory Visit (INDEPENDENT_AMBULATORY_CARE_PROVIDER_SITE_OTHER): Payer: Medicare Other | Admitting: Cardiology

## 2017-05-18 ENCOUNTER — Encounter: Payer: Self-pay | Admitting: Cardiology

## 2017-05-18 ENCOUNTER — Encounter (INDEPENDENT_AMBULATORY_CARE_PROVIDER_SITE_OTHER): Payer: Self-pay

## 2017-05-18 VITALS — BP 122/70 | HR 80 | Ht 64.0 in | Wt 169.0 lb

## 2017-05-18 DIAGNOSIS — I311 Chronic constrictive pericarditis: Secondary | ICD-10-CM

## 2017-05-18 DIAGNOSIS — I480 Paroxysmal atrial fibrillation: Secondary | ICD-10-CM | POA: Diagnosis not present

## 2017-05-18 DIAGNOSIS — I10 Essential (primary) hypertension: Secondary | ICD-10-CM | POA: Diagnosis not present

## 2017-05-18 NOTE — Progress Notes (Signed)
Electrophysiology Office Note   Date:  05/18/2017   ID:  Yolanda, Bell 03-May-1941, MRN [id number]  PCP:  Hulan Fess, MD  Cardiologist:  Nahser Primary Electrophysiologist:  Danyele Smejkal Meredith Leeds, MD    Chief Complaint  Patient presents with  . Follow-up    Persistent Afib     History of Present Illness: Yolanda Bell is a 76 y.o. female who presents today for electrophysiology evaluation.   She has PMH of constrictive pericarditis s/p pericardial stripping at [facility] 2006, SVT s/p RFA by Dr. Caryl Comes, HTN, gout and afib/aflutter currently on Xarelto. She previously underwent DCCV in Feb 2014 however had recurrence since. She most recently underwent TEE DCCV on 06/10/2014. TEE on on that day showed EF 45-50%, mild to moderate TR, moderate MR. Patient has cardiac awareness of afib with chest pressure and palpitation. Ablation for atrial fibrillation 08/20/16. She recently had oral surgery which was complicated by multiple episodes of bleeding. She Anibal Quinby need further oral procedures in the future.  Today, denies symptoms of palpitations, chest pain, shortness of breath, orthopnea, PND, lower extremity edema, claudication, dizziness, presyncope, syncope, bleeding, or neurologic sequela. The patient is tolerating medications without difficulties and is otherwise without complaint today.    Past Medical History:  Diagnosis Date  . Atrial fibrillation ([place])    Status post TEE cardioversion  . Constrictive pericarditis    2 status post pericardial stripping at the [facility]  . Gout   . Hypertension   . Spastic dysphonia   . Supraventricular tachycardia ([place])    Status post RF ablation by Dr. Peterson Lombard   Past Surgical History:  Procedure Laterality Date  . ABDOMINAL HYSTERECTOMY     1987  . CARDIOVERSION  11/21/2012   Procedure: CARDIOVERSION;  Surgeon: Darlin Coco, MD;  Location: [place];  Service: Cardiovascular;;  . CARDIOVERSION N/A 06/10/2014   Procedure:  CARDIOVERSION;  Surgeon: Thayer Headings, MD;  Location: [facility] ENDOSCOPY;  Service: Cardiovascular;  Laterality: N/A;  . CARDIOVERSION N/A 10/14/2015   Procedure: CARDIOVERSION;  Surgeon: Josue Hector, MD;  Location: [facility] ENDOSCOPY;  Service: Cardiovascular;  Laterality: N/A;  . CARDIOVERSION N/A 05/12/2016   Procedure: CARDIOVERSION;  Surgeon: Skeet Latch, MD;  Location: [facility] ENDOSCOPY;  Service: Cardiovascular;  Laterality: N/A;  . CHOLECYSTECTOMY     1989  . ELECTROPHYSIOLOGIC STUDY N/A 08/20/2016   Procedure: Atrial Fibrillation Ablation;  Surgeon: Muneeb Veras Meredith Leeds, MD;  Location: [place] CV LAB;  Service: Cardiovascular;  Laterality: N/A;  . EYE SURGERY     cataracts  . PERICARDIECTOMY    . TEE WITHOUT CARDIOVERSION N/A 06/10/2014   Procedure: TRANSESOPHAGEAL ECHOCARDIOGRAM (TEE);  Surgeon: Thayer Headings, MD;  Location: [place];  Service: Cardiovascular;  Laterality: N/A;  . TONSILLECTOMY       Current Outpatient Prescriptions  Medication Sig Dispense Refill  . alendronate (FOSAMAX) 70 MG tablet Take 70 mg by mouth once a week. On Saturday or Sunday  4  . Ascorbic Acid (VITAMIN C PO) Take 1 tablet by mouth daily.     . Cholecalciferol (VITAMIN D3) 1000 units CAPS Take 1 capsule by mouth daily.    . Gluc-Chonn-MSM-Boswellia-Vit D (GLUCOSAMINE CHOND TRIPLE/VIT D) TABS Take 1 tablet by mouth daily.     . indomethacin (INDOCIN) 25 MG capsule Take 25 mg by mouth as needed. For gout    . KLOR-CON M10 10 MEQ tablet TAKE 1 TABLET (10 MEQ TOTAL) BY MOUTH DAILY. 90 tablet 2  .  metoprolol succinate (TOPROL-XL) 50 MG 24 hr tablet TAKE 1/2 TABLET BY MOUTH TWICE A DAY 90 tablet 3  . spironolactone (ALDACTONE) 50 MG tablet Take 50 mg by mouth daily.    Marland Kitchen torsemide (DEMADEX) 20 MG tablet TAKE 1 TABLET (20 MG TOTAL) BY MOUTH DAILY. 90 tablet 3  . XARELTO 20 MG TABS tablet TAKE 1 TABLET BY MOUTH EVERY DAY WITH SUPPER 90 tablet 1   No current facility-administered medications for this  visit.     Allergies:   Allopurinol; Amiodarone; Other; and Erythromycin   Social History:  The patient  reports that she has never smoked. She has never used smokeless tobacco. She reports that she drinks alcohol. She reports that she does not use drugs.   Family History:  The patient's family history includes Breast cancer in her sister; Emphysema in her father; Heart disease in her mother.    ROS:  Please see the history of present illness.   Otherwise, review of systems is positive for none.   All other systems are reviewed and negative.   PHYSICAL EXAM: VS:  BP 122/70   Pulse 80   Ht 5\' 4"  (1.626 m)   Wt 169 lb (76.7 kg)   SpO2 96%   BMI 29.01 kg/m  , BMI Body mass index is 29.01 kg/m. GEN: Well nourished, well developed, in no acute distress  HEENT: normal  Neck: no JVD, carotid bruits, or masses Cardiac: RRR; no murmurs, rubs, or gallops,no edema  Respiratory:  clear to auscultation bilaterally, normal work of breathing GI: soft, nontender, nondistended, + BS MS: no deformity or atrophy  Skin: warm and dry Neuro:  Strength and sensation are intact Psych: euthymic mood, full affect  EKG:  EKG is not ordered today. Personal review of the ekg ordered 11/22/16 shows sinus rhythm, rate 69   Recent Labs: 08/12/2016: BUN 19; Creat 0.90; Hemoglobin 12.6; Platelets 172; Potassium 3.6; Sodium 141    Lipid Panel     Component Value Date/Time   CHOL 158 08/18/2015 1147   TRIG 83 08/18/2015 1147   HDL 47 08/18/2015 1147   CHOLHDL 3.4 08/18/2015 1147   VLDL 17 08/18/2015 1147   LDLCALC 94 08/18/2015 1147     Wt Readings from Last 3 Encounters:  05/18/17 169 lb (76.7 kg)  11/22/16 167 lb 3.2 oz (75.8 kg)  09/21/16 167 lb (75.8 kg)      Other studies Reviewed: Additional studies/ records that were reviewed today include: TEE/cardioversion Review of the above records today demonstrates:  - Left ventricle: Systolic function was mildly reduced. The estimated  ejection fraction was in the range of 45% to 50%. No evidence of thrombus. - Mitral valve: There was moderate regurgitation. - Left atrium: No evidence of thrombus in the atrial cavity or appendage. - Tricuspid valve: There was mild-moderate regurgitation.  Impressions:  - Successful cardioversion. No cardiac source of emboli was indentified.   TTE 05/18/16 - Low normal LV systolic function; grade 1 diastolic dysfunction;   mild MR; mild LAE.  ASSESSMENT AND PLAN:  1.  Persistent atrial fibrillation: on Xarelto; ablation 08/20/16.  She is in sinus rhythm today. Feeling well without any major issues. We'll make no changes as she is maintained sinus rhythm since ablation.  This patients CHA2DS2-VASc Score and unadjusted Ischemic Stroke Rate (% per year) is equal to 3.2 % stroke rate/year from a score of 3  Above score calculated as 1 point each if present [CHF, HTN, DM, Vascular=MI/PAD/Aortic Plaque, Age if  65-74, or Female] Above score calculated as 2 points each if present [Age > 75, or Stroke/TIA/TE]  2. constrictive pericarditis: s/p pericardial stripping at [facility]  3. SVT: s/p RFA by Dr. Caryl Comes  4. HTN: Well-controlled today.   Current medicines are reviewed at length with the patient today.   The patient does not have concerns regarding her medicines.  The following changes were made today:  none  Labs/ tests ordered today include:  No orders of the defined types were placed in this encounter.    Disposition:   FU with Atzin Buchta 12 months  Signed, Sakinah Rosamond Meredith Leeds, MD  05/18/2017 9:26 AM     [facility] [address] [place] [address] [phone] (office) [phone] (fax)

## 2017-05-18 NOTE — Patient Instructions (Signed)
Your physician wants you to follow-up in: [place] will receive a reminder letter in the mail two months in advance. If you don't receive a letter, please call our office to schedule the follow-up appointment.   If you need a refill on your cardiac medications before your next appointment, please call your pharmacy.

## 2017-05-20 ENCOUNTER — Other Ambulatory Visit: Payer: Self-pay | Admitting: Cardiovascular Disease

## 2017-05-23 ENCOUNTER — Other Ambulatory Visit: Payer: Self-pay | Admitting: Cardiovascular Disease

## 2017-07-18 ENCOUNTER — Telehealth: Payer: Self-pay

## 2017-07-18 NOTE — Telephone Encounter (Signed)
Pt takes Xarelto for afib with CHADS2VASc score of 3 (age, sex, HTN). Renal function is normal. Ok to hold Xarelto for 1-2 days prior as needed. Clearance faxed to below number.

## 2017-07-18 NOTE — Telephone Encounter (Signed)
   [facility] Pre-operative Risk Assessment    Request for surgical clearance:  1. What type of surgery is being performed? COLONOSCOPY  2. When is this surgery scheduled? 08-23-2017  3. Are there any medications that need to be held prior to surgery and how long? Patient is currently taking Xarelto. Can patient temporarily stop this medication before his/ her procedure, or after the procedure if polyps are removed. Please advise on when patient can stop this medication and restart if polyps are removed.  4. Practice name and name of physician performing surgery? [facility]. Dr. Watt Climes  5. What is your office phone and fax number? P: [phone] F: [phone]  6. Anesthesia type (None, local, MAC, general) ? Not specified.   Hoover Browns S 07/18/2017, 4:08 PM  _________________________________________________________________   (provider comments below)

## 2017-09-24 ENCOUNTER — Other Ambulatory Visit: Payer: Self-pay | Admitting: Cardiovascular Disease

## 2017-11-03 ENCOUNTER — Other Ambulatory Visit: Payer: Self-pay | Admitting: *Deleted

## 2017-11-03 MED ORDER — RIVAROXABAN 20 MG PO TABS: 20.0000 mg | ORAL_TABLET | Freq: Every day | ORAL | 0 refills | Status: DC

## 2017-11-16 ENCOUNTER — Encounter: Payer: Self-pay | Admitting: Cardiovascular Disease

## 2017-11-16 ENCOUNTER — Ambulatory Visit: Payer: Medicare Other | Admitting: Cardiovascular Disease

## 2017-11-16 ENCOUNTER — Encounter (INDEPENDENT_AMBULATORY_CARE_PROVIDER_SITE_OTHER): Payer: Self-pay

## 2017-11-16 VITALS — BP 132/82 | HR 58 | Wt 170.0 lb

## 2017-11-16 DIAGNOSIS — E782 Mixed hyperlipidemia: Secondary | ICD-10-CM | POA: Diagnosis not present

## 2017-11-16 DIAGNOSIS — I311 Chronic constrictive pericarditis: Secondary | ICD-10-CM | POA: Diagnosis not present

## 2017-11-16 DIAGNOSIS — I480 Paroxysmal atrial fibrillation: Secondary | ICD-10-CM

## 2017-11-16 LAB — CBC
Hematocrit: 40.9 % (ref 34.0–46.6)
Hemoglobin: 14.5 g/dL (ref 11.1–15.9)
MCH: 29.8 pg (ref 26.6–33.0)
MCHC: 35.5 g/dL (ref 31.5–35.7)
MCV: 84 fL (ref 79–97)
PLATELETS: 188 10*3/uL (ref 150–379)
RBC: 4.87 x10E6/uL (ref 3.77–5.28)
RDW: 14 % (ref 12.3–15.4)
WBC: 5.5 10*3/uL (ref 3.4–10.8)

## 2017-11-16 LAB — LIPID PANEL
CHOL/HDL RATIO: 3.1 ratio (ref 0.0–4.4)
CHOLESTEROL TOTAL: 179 mg/dL (ref 100–199)
HDL: 57 mg/dL (ref >39)
LDL CALC: 103 mg/dL — AB (ref 0–99)
Triglycerides: 93 mg/dL (ref 0–149)
VLDL CHOLESTEROL CAL: 19 mg/dL (ref 5–40)

## 2017-11-16 LAB — BASIC METABOLIC PANEL
BUN/Creatinine Ratio: 18 (ref 12–28)
BUN: 17 mg/dL (ref 8–27)
CHLORIDE: 99 mmol/L (ref 96–106)
CO2: 27 mmol/L (ref 20–29)
Calcium: 9.9 mg/dL (ref 8.7–10.3)
Creatinine, Ser: 0.93 mg/dL (ref 0.57–1.00)
GFR calc Af Amer: 70 mL/min/{1.73_m2} (ref >59)
GFR calc non Af Amer: 61 mL/min/{1.73_m2} (ref >59)
GLUCOSE: 95 mg/dL (ref 65–99)
POTASSIUM: 4 mmol/L (ref 3.5–5.2)
SODIUM: 143 mmol/L (ref 134–144)

## 2017-11-16 LAB — HEPATIC FUNCTION PANEL
ALBUMIN: 5 g/dL — AB (ref 3.5–4.8)
ALT: 16 IU/L (ref 0–32)
AST: 21 IU/L (ref 0–40)
Alkaline Phosphatase: 78 IU/L (ref 39–117)
BILIRUBIN TOTAL: 0.7 mg/dL (ref 0.0–1.2)
Bilirubin, Direct: 0.21 mg/dL (ref 0.00–0.40)
TOTAL PROTEIN: 7.3 g/dL (ref 6.0–8.5)

## 2017-11-16 MED ORDER — RIVAROXABAN 20 MG PO TABS: 20.0000 mg | ORAL_TABLET | Freq: Every day | ORAL | 3 refills | Status: DC

## 2017-11-16 MED ORDER — METOPROLOL SUCCINATE ER 25 MG PO TB24: 25.0000 mg | ORAL_TABLET | Freq: Two times a day (BID) | ORAL | 3 refills | Status: DC

## 2017-11-16 NOTE — Progress Notes (Signed)
Cardiology Office Note   Date:  11/16/2017   ID:  Yolanda Bell, Yolanda Bell December 28, 1940, MRN [id number]  PCP:  Hulan Fess, MD  Cardiologist:   Mertie Moores, MD   Chief Complaint  Patient presents with  . Atrial Fibrillation   1. Constrictive Pericarditis 2. A-Flutter / Fibrillation 3. Gout 4. Spastic dysphonia 5. SVT 6. HTN  History of Present Illness:  She has done well from a cardiac standpoint. She has had a flare up of gout which has affected both feet. She has been taking indocin for the flare ups.   She had a bad accident in February. She was getting off an airplane and tripped on the off ramp in [place]. She broke both shoulders, left thumb and had soft tissue injury to her left foot. She was treated conservatively (bilateral arm slings). Her left thumb fracture was not found until she returned to [place]. She had some tachypalpitations for 22 hours during her recovery. Her HR was 130-140 for 22 hours and resolved spontaneously. I suspect she had a brief episode of atrial flutter.  She was quite emotional when she was telling me the issues of her fall. It is apparently still is quite upsetting to her.  October 19, 2012: She presents today for further evaluation of atrial fib / flutter that was found at cardiac rehab. She really cannot tell that her HR is fast and irregular. She can feel her HR when she is lying down. She is asymptomatic when she is up and busy doing chores. She denies any chest pain or dyspnea.   Feb. 4, 2014: Yolanda Bell seems to have rapid atrial fibrillation. [name] on relatively high dose of metoprolol XL and also is on when necessary propranolol. He is tolerating the atrial ablation but certainly does not feel as well as she would like to. [name] here to schedule her cardioversion next week. She denies any angina. She does have some shortness of breath.  January 01, 2013:  She has maintained NSR since her cardioversion ( Feb. 11, 2014)  . She was quite frustrated with her recent Out patient area / endoscopy department cardioversion. Lots of computer issues / scheduling issues.   She has done well from a cardiac standpoint.   Sept. 22, 2014:  She has done well. She had some redness and tenderness just below her right eye. She is not having any cardiac problems.   January 14, 2014: She has done well. She had a brief episode of tachycardia that lasted for several days. Sounds like an episode of SVT. She tried some propranolol and eventually resolved after about 5-6 hours.   June 07, 2014:  Yolanda Bell presents with several weeks of palpitations. She came in today and was noted to be in atrial fibrillation. She started back on her xarelto 4 days ago. Her HR is well controlled  Oct. 5, 2015:  Yolanda Bell had a cardioversion August 31. She had a brief episode of irregular Hr - she took some propraoolol. . She thinks that she eventually converted back to NSR. In looking back , these were probably PVCs.   May , 10 , 2016;   Yolanda Bell is a 77 y.o. female who presents for follow up of her atrial fib She was cardioverted last august.   She was found to have a few RBCs in a UA - no obvious blood   Nov. 7, 2016:  Doing well from a cardiac standpoint.  No CP , no dyspnea.   Feb 26, 2016: Feeling well  Needs laser surgery on her right eye.  Doing well.   Feb.  6, 2019: Doing well from a cardiac standpoint.   Has some bleeding folowing oral surgery .  She bled for 4 days.   Only issue is her spastic dysphonia    Past Medical History:  Diagnosis Date  . Atrial fibrillation ([place])    Status post TEE cardioversion  . Constrictive pericarditis    2 status post pericardial stripping at the [facility]  . Gout   . Hypertension   . Spastic dysphonia   . Supraventricular tachycardia ([place])    Status post RF ablation by Dr. Peterson Lombard    Past Surgical History:  Procedure Laterality Date  . ABDOMINAL  HYSTERECTOMY     1987  . CARDIOVERSION  11/21/2012   Procedure: CARDIOVERSION;  Surgeon: Darlin Coco, MD;  Location: [place];  Service: Cardiovascular;;  . CARDIOVERSION N/A 06/10/2014   Procedure: CARDIOVERSION;  Surgeon: Thayer Headings, MD;  Location: [facility] ENDOSCOPY;  Service: Cardiovascular;  Laterality: N/A;  . CARDIOVERSION N/A 10/14/2015   Procedure: CARDIOVERSION;  Surgeon: Josue Hector, MD;  Location: [facility] ENDOSCOPY;  Service: Cardiovascular;  Laterality: N/A;  . CARDIOVERSION N/A 05/12/2016   Procedure: CARDIOVERSION;  Surgeon: Skeet Latch, MD;  Location: [facility] ENDOSCOPY;  Service: Cardiovascular;  Laterality: N/A;  . CHOLECYSTECTOMY     1989  . ELECTROPHYSIOLOGIC STUDY N/A 08/20/2016   Procedure: Atrial Fibrillation Ablation;  Surgeon: [name], MD;  Location: [place] CV LAB;  Service: Cardiovascular;  Laterality: N/A;  . EYE SURGERY     cataracts  . PERICARDIECTOMY    . TEE WITHOUT CARDIOVERSION N/A 06/10/2014   Procedure: TRANSESOPHAGEAL ECHOCARDIOGRAM (TEE);  Surgeon: Thayer Headings, MD;  Location: [place];  Service: Cardiovascular;  Laterality: N/A;  . TONSILLECTOMY       Current Outpatient Medications  Medication Sig Dispense Refill  . alendronate (FOSAMAX) 70 MG tablet Take 70 mg by mouth once a week. On Saturday or Sunday  4  . Ascorbic Acid (VITAMIN C PO) Take 1 tablet by mouth daily.     . Cholecalciferol (VITAMIN D3) 1000 units CAPS Take 1 capsule by mouth daily.    . Gluc-Chonn-MSM-Boswellia-Vit D (GLUCOSAMINE CHOND TRIPLE/VIT D) TABS Take 1 tablet by mouth daily.     . indomethacin (INDOCIN) 25 MG capsule Take 25 mg by mouth as needed. For gout    . KLOR-CON M10 10 MEQ tablet TAKE 1 TABLET (10 MEQ TOTAL) BY MOUTH DAILY. 90 tablet 2  . metoprolol succinate (TOPROL-XL) 50 MG 24 hr tablet TAKE 1/2 TABLET BY MOUTH TWICE A DAY 90 tablet 3  . rivaroxaban (XARELTO) 20 MG TABS tablet Take 1 tablet (20 mg total) by mouth daily with supper. 90  tablet 0  . spironolactone (ALDACTONE) 50 MG tablet Take 50 mg by mouth daily.    Marland Kitchen torsemide (DEMADEX) 20 MG tablet TAKE 1 TABLET (20 MG TOTAL) BY MOUTH DAILY. 90 tablet 2   No current facility-administered medications for this visit.     Allergies:   Allopurinol; Amiodarone; Other; and Erythromycin    Social History:  The patient  reports that  has never smoked. she has never used smokeless tobacco. She reports that she drinks alcohol. She reports that she does not use drugs.   Family History:  The patient's family history includes Breast cancer in her sister; Emphysema in her father; Heart disease in her mother.    ROS: Noted in  current history.  Otherwise negative.  Physical Exam: Blood pressure 132/82, pulse (!) 58, weight 170 lb (77.1 kg).  GEN:  Well nourished, well developed in no acute distress HEENT: Normal NECK: No JVD; No carotid bruits LYMPHATICS: No lymphadenopathy CARDIAC: RR, no murmurs, rubs, gallops RESPIRATORY:  Clear to auscultation without rales, wheezing or rhonchi  ABDOMEN: Soft, non-tender, non-distended MUSCULOSKELETAL:  No edema; No deformity  SKIN: Warm and dry NEUROLOGIC:  Alert and oriented x 3   EKG:  Feb. 6, 2019:  Sinus brady at 58.   Sinus arrhythmia   Recent Labs: No results found for requested labs within last 8760 hours.    Lipid Panel    Component Value Date/Time   CHOL 158 08/18/2015 1147   TRIG 83 08/18/2015 1147   HDL 47 08/18/2015 1147   CHOLHDL 3.4 08/18/2015 1147   VLDL 17 08/18/2015 1147   LDLCALC 94 08/18/2015 1147      Wt Readings from Last 3 Encounters:  11/16/17 170 lb (77.1 kg)  05/18/17 169 lb (76.7 kg)  11/22/16 167 lb 3.2 oz (75.8 kg)      Other studies Reviewed: Additional studies/ records that were reviewed today include: . Review of the above records demonstrates:    ASSESSMENT AND PLAN:  1. Constrictive Pericarditis - status post pericardectomy. Overall doing very well.  No signs of recurrent  constrictive pericarditis  2. A-Flutter / Fibrillation - s/p ablation  maintining NSR ,  Continue xarelto   3. Gout-   she is doing well.  She has not had any recurrent gout symptoms in quite a while.  4.Spastic dysphonia - stable   5. SVT  6. HTN -  BP is well controlled.    Current medicines are reviewed at length with the patient today.  The patient does not have concerns regarding medicines.  The following changes have been made:  no change  Labs/ tests ordered today include:  No orders of the defined types were placed in this encounter.    Disposition:   FU with me in 1 year      Mertie Moores, MD  11/16/2017 9:36 AM    [facility] [place], [place], [place]  [address] Phone: [phone]; Fax: [phone]   [facility]  [address] [place], [place]  [address] [phone]    Fax [phone]

## 2017-11-16 NOTE — Patient Instructions (Signed)
Medication Instructions:  Your physician recommends that you continue on your current medications as directed. Please refer to the Current Medication list given to you today.   Labwork: TODAY - CBC, Liver panel, Cholesterol, BMET   Testing/Procedures: None Ordered   Follow-Up: Your physician wants you to follow-up in: 1 year with Dr. Acie Fredrickson.  You will receive a reminder letter in the mail two months in advance. If you don't receive a letter, please call our office to schedule the follow-up appointment.   If you need a refill on your cardiac medications before your next appointment, please call your pharmacy.   Thank you for choosing [facility]! Christen Bame, RN [phone]

## 2017-11-17 ENCOUNTER — Telehealth: Payer: Self-pay | Admitting: Cardiovascular Disease

## 2017-11-17 NOTE — Telephone Encounter (Signed)
Follow Up:     Returning your call from yesterday,cocnerning her labs.

## 2017-11-17 NOTE — Telephone Encounter (Signed)
Lab results were reviewed with patient in detail and she verbalized understanding. She was thankful for the results.

## 2018-01-06 ENCOUNTER — Other Ambulatory Visit: Payer: Self-pay | Admitting: Cardiovascular Disease

## 2018-02-08 ENCOUNTER — Other Ambulatory Visit: Payer: Self-pay | Admitting: Cardiovascular Disease

## 2018-02-09 NOTE — Telephone Encounter (Signed)
Outpatient Medication Detail    Disp Refills Start End   metoprolol succinate (TOPROL XL) 25 MG 24 hr tablet 180 tablet 3 11/16/2017    Sig - Route: Take 1 tablet (25 mg total) by mouth 2 (two) times daily. - Oral   Sent to pharmacy as: metoprolol succinate (TOPROL XL) 25 MG 24 hr tablet   E-Prescribing Status: Receipt confirmed by pharmacy (11/16/2017 9:56 AM EST)   Pharmacy   [id number] [place], [place]

## 2018-04-06 ENCOUNTER — Ambulatory Visit: Payer: Medicare Other | Admitting: Cardiovascular Disease

## 2018-06-10 ENCOUNTER — Other Ambulatory Visit: Payer: Self-pay | Admitting: Cardiovascular Disease

## 2018-06-10 ENCOUNTER — Other Ambulatory Visit: Payer: Self-pay | Admitting: Cardiology

## 2018-06-16 ENCOUNTER — Encounter: Payer: Self-pay | Admitting: Cardiovascular Disease

## 2018-07-03 ENCOUNTER — Ambulatory Visit: Payer: Medicare Other | Admitting: Cardiovascular Disease

## 2018-11-20 ENCOUNTER — Ambulatory Visit: Payer: Medicare Other | Admitting: Cardiovascular Disease

## 2018-11-20 ENCOUNTER — Encounter: Payer: Self-pay | Admitting: Cardiovascular Disease

## 2018-11-20 VITALS — BP 122/70 | HR 59 | Ht 64.0 in | Wt 158.0 lb

## 2018-11-20 DIAGNOSIS — I10 Essential (primary) hypertension: Secondary | ICD-10-CM | POA: Diagnosis not present

## 2018-11-20 DIAGNOSIS — I480 Paroxysmal atrial fibrillation: Secondary | ICD-10-CM

## 2018-11-20 DIAGNOSIS — E782 Mixed hyperlipidemia: Secondary | ICD-10-CM

## 2018-11-20 NOTE — Progress Notes (Signed)
Cardiology Office Note   Date:  11/20/2018   ID:  Yolanda Bell, Yolanda Bell 04-Aug-1941, MRN [id number]  PCP:  Hulan Fess, MD  Cardiologist:   Mertie Moores, MD   Chief Complaint  Patient presents with  . Hypertension  . Atrial Fibrillation   1. Constrictive Pericarditis 2. A-Flutter / Fibrillation 3. Gout 4. Spastic dysphonia 5. SVT 6. HTN  History of Present Illness:  She has done well from a cardiac standpoint. She has had a flare up of gout which has affected both feet. She has been taking indocin for the flare ups.   She had a bad accident in February. She was getting off an airplane and tripped on the off ramp in [place]. She broke both shoulders, left thumb and had soft tissue injury to her left foot. She was treated conservatively (bilateral arm slings). Her left thumb fracture was not found until she returned to [place]. She had some tachypalpitations for 22 hours during her recovery. Her HR was 130-140 for 22 hours and resolved spontaneously. I suspect she had a brief episode of atrial flutter.  She was quite emotional when she was telling me the issues of her fall. It is apparently still is quite upsetting to her.  October 19, 2012: She presents today for further evaluation of atrial fib / flutter that was found at cardiac rehab. She really cannot tell that her HR is fast and irregular. She can feel her HR when she is lying down. She is asymptomatic when she is up and busy doing chores. She denies any chest pain or dyspnea.   Feb. 4, 2014: Yolanda Bell seems to have rapid atrial fibrillation. [name] on relatively high dose of metoprolol XL and also is on when necessary propranolol. He is tolerating the atrial ablation but certainly does not feel as well as she would like to. [name] here to schedule her cardioversion next week. She denies any angina. She does have some shortness of breath.  January 01, 2013:  She has maintained NSR since her  cardioversion ( Feb. 11, 2014) . She was quite frustrated with her recent Out patient area / endoscopy department cardioversion. Lots of computer issues / scheduling issues.   She has done well from a cardiac standpoint.   Sept. 22, 2014:  She has done well. She had some redness and tenderness just below her right eye. She is not having any cardiac problems.   January 14, 2014: She has done well. She had a brief episode of tachycardia that lasted for several days. Sounds like an episode of SVT. She tried some propranolol and eventually resolved after about 5-6 hours.   June 07, 2014:  Yolanda Bell presents with several weeks of palpitations. She came in today and was noted to be in atrial fibrillation. She started back on her xarelto 4 days ago. Her HR is well controlled  Oct. 5, 2015:  Yolanda Bell had a cardioversion August 31. She had a brief episode of irregular Hr - she took some propraoolol. . She thinks that she eventually converted back to NSR. In looking back , these were probably PVCs.   May , 10 , 2016;   Yolanda Bell is a 78 y.o. female who presents for follow up of her atrial fib She was cardioverted last august.   She was found to have a few RBCs in a UA - no obvious blood   Nov. 7, 2016:  Doing well from a cardiac standpoint.  No CP , no dyspnea.  Feb 26, 2016: Feeling well  Needs laser surgery on her right eye.  Doing well.   Feb.  6, 2019: Doing well from a cardiac standpoint.   Has some bleeding folowing oral surgery .  She bled for 4 days.   Only issue is her spastic dysphonia   Feb. 10, 2020: On Jan. 20, 2020, she developed and irregular HR .   took Metoprolol 50 PO .   Felt better after several hours.   was likely atrial fib.  Is on Xarelto .  Several days later developed a brief pinching sensation in her chest      Past Medical History:  Diagnosis Date  . Atrial fibrillation ([place])    Status post TEE cardioversion  . Constrictive  pericarditis    2 status post pericardial stripping at the [facility]  . Gout   . Hypertension   . Spastic dysphonia   . Supraventricular tachycardia ([place])    Status post RF ablation by Dr. Peterson Lombard    Past Surgical History:  Procedure Laterality Date  . ABDOMINAL HYSTERECTOMY     1987  . CARDIOVERSION  11/21/2012   Procedure: CARDIOVERSION;  Surgeon: Darlin Coco, MD;  Location: [place];  Service: Cardiovascular;;  . CARDIOVERSION N/A 06/10/2014   Procedure: CARDIOVERSION;  Surgeon: Thayer Headings, MD;  Location: [facility] ENDOSCOPY;  Service: Cardiovascular;  Laterality: N/A;  . CARDIOVERSION N/A 10/14/2015   Procedure: CARDIOVERSION;  Surgeon: Josue Hector, MD;  Location: [facility] ENDOSCOPY;  Service: Cardiovascular;  Laterality: N/A;  . CARDIOVERSION N/A 05/12/2016   Procedure: CARDIOVERSION;  Surgeon: Skeet Latch, MD;  Location: [facility] ENDOSCOPY;  Service: Cardiovascular;  Laterality: N/A;  . CHOLECYSTECTOMY     1989  . ELECTROPHYSIOLOGIC STUDY N/A 08/20/2016   Procedure: Atrial Fibrillation Ablation;  Surgeon: [name], MD;  Location: [place] CV LAB;  Service: Cardiovascular;  Laterality: N/A;  . EYE SURGERY     cataracts  . PERICARDIECTOMY    . TEE WITHOUT CARDIOVERSION N/A 06/10/2014   Procedure: TRANSESOPHAGEAL ECHOCARDIOGRAM (TEE);  Surgeon: Thayer Headings, MD;  Location: [place];  Service: Cardiovascular;  Laterality: N/A;  . TONSILLECTOMY       Current Outpatient Medications  Medication Sig Dispense Refill  . alendronate (FOSAMAX) 70 MG tablet Take 70 mg by mouth once a week. On Saturday or Sunday  4  . Ascorbic Acid (VITAMIN C PO) Take 1 tablet by mouth daily.     . Cholecalciferol (VITAMIN D3) 1000 units CAPS Take 1 capsule by mouth daily.    . Gluc-Chonn-MSM-Boswellia-Vit D (GLUCOSAMINE CHOND TRIPLE/VIT D) TABS Take 1 tablet by mouth daily.     . indomethacin (INDOCIN) 25 MG capsule Take 25 mg by mouth as needed. For gout    . KLOR-CON M10 10 MEQ  tablet TAKE 1 TABLET (10 MEQ TOTAL) BY MOUTH DAILY. 90 tablet 3  . metoprolol succinate (TOPROL XL) 25 MG 24 hr tablet Take 1 tablet (25 mg total) by mouth 2 (two) times daily. 180 tablet 3  . rivaroxaban (XARELTO) 20 MG TABS tablet Take 1 tablet (20 mg total) by mouth daily with supper. 90 tablet 3  . spironolactone (ALDACTONE) 50 MG tablet TAKE 1 TABLET BY MOUTH EVERY DAY 90 tablet 1  . torsemide (DEMADEX) 20 MG tablet TAKE 1 TABLET (20 MG TOTAL) BY MOUTH DAILY. 90 tablet 1   No current facility-administered medications for this visit.     Allergies:   Allopurinol; Amiodarone; Other; and Erythromycin  Social History:  The patient  reports that she has never smoked. She has never used smokeless tobacco. She reports current alcohol use. She reports that she does not use drugs.   Family History:  The patient's family history includes Breast cancer in her sister; Emphysema in her father; Heart disease in her mother.    ROS: Noted in current history.  Otherwise negative.  Physical Exam: Blood pressure 122/70, pulse (!) 59, height 5\' 4"  (1.626 m), weight 158 lb (71.7 kg), SpO2 98 %.  GEN:  Well nourished, well developed in no acute distress HEENT: Normal NECK: No JVD; No carotid bruits LYMPHATICS: No lymphadenopathy CARDIAC: RRR , frequent premature beats. RESPIRATORY:  Clear to auscultation without rales, wheezing or rhonchi  ABDOMEN: Soft, non-tender, non-distended MUSCULOSKELETAL:  No edema; No deformity  SKIN: Warm and dry NEUROLOGIC:  Alert and oriented x 3  EKG: November 20, 2018: Sinus bradycardia at 59.  She has frequent premature ventricular contractions.  Recent Labs: No results found for requested labs within last 8760 hours.    Lipid Panel    Component Value Date/Time   CHOL 179 11/16/2017 1000   TRIG 93 11/16/2017 1000   HDL 57 11/16/2017 1000   CHOLHDL 3.1 11/16/2017 1000   CHOLHDL 3.4 08/18/2015 1147   VLDL 17 08/18/2015 1147   LDLCALC 103 (H) 11/16/2017  1000      Wt Readings from Last 3 Encounters:  11/20/18 158 lb (71.7 kg)  11/16/17 170 lb (77.1 kg)  05/18/17 169 lb (76.7 kg)      Other studies Reviewed: Additional studies/ records that were reviewed today include: . Review of the above records demonstrates:    ASSESSMENT AND PLAN:  1. Constrictive Pericarditis -status post pericardectomy.  She overall seems to be doing well.  2. A-Flutter / Fibrillation -status post ablation.  She is maintaining normal sinus rhythm. She had some palpitations several weeks ago that may have been several hours of accessible atrial fibrillation.  She took 50 of metoprolol and by the next morning she was in normal sinus rhythm.  3. Gout-      4.Spastic dysphonia - stable   5. SVT  6. HTN -   pressure is very well controlled.   Current medicines are reviewed at length with the patient today.  The patient does not have concerns regarding medicines.  The following changes have been made:  no change  Labs/ tests ordered today include:  No orders of the defined types were placed in this encounter.    Disposition:   FU with me in 1 year      Mertie Moores, MD  11/20/2018 3:52 PM    [facility] [place], [place], [place]  [address] Phone: [phone]; Fax: [phone]

## 2018-11-20 NOTE — Patient Instructions (Signed)
Medication Instructions:  Your physician recommends that you continue on your current medications as directed. Please refer to the Current Medication list given to you today.  If you need a refill on your cardiac medications before your next appointment, please call your pharmacy.   Lab work: TODAY - cholesterol, liver panel, basic metabolic panel  If you have labs (blood work) drawn today and your tests are completely normal, you will receive your results only by: . MyChart Message (if you have MyChart) OR . A paper copy in the mail If you have any lab test that is abnormal or we need to change your treatment, we will call you to review the results.  Testing/Procedures: None Ordered   Follow-Up: At [facility], you and your health needs are our priority.  As part of our continuing mission to provide you with exceptional heart care, we have created designated Provider Care Teams.  These Care Teams include your primary Cardiologist (physician) and Advanced Practice Providers (APPs -  Physician Assistants and Nurse Practitioners) who all work together to provide you with the care you need, when you need it. You will need a follow up appointment in:  1 years.  Please call our office 2 months in advance to schedule this appointment.  You may see Philip Nahser, MD or one of the following Advanced Practice Providers on your designated Care Team: Scott Weaver, PA-C Vin Bhagat, PA-C . Janine Hammond, NP     

## 2018-11-21 LAB — LIPID PANEL
CHOL/HDL RATIO: 3.1 ratio (ref 0.0–4.4)
Cholesterol, Total: 184 mg/dL (ref 100–199)
HDL: 59 mg/dL (ref >39)
LDL CALC: 110 mg/dL — AB (ref 0–99)
Triglycerides: 77 mg/dL (ref 0–149)
VLDL CHOLESTEROL CAL: 15 mg/dL (ref 5–40)

## 2018-11-21 LAB — BASIC METABOLIC PANEL
BUN/Creatinine Ratio: 16 (ref 12–28)
BUN: 14 mg/dL (ref 8–27)
CO2: 25 mmol/L (ref 20–29)
Calcium: 9.3 mg/dL (ref 8.7–10.3)
Chloride: 98 mmol/L (ref 96–106)
Creatinine, Ser: 0.86 mg/dL (ref 0.57–1.00)
GFR calc Af Amer: 76 mL/min/{1.73_m2} (ref >59)
GFR, EST NON AFRICAN AMERICAN: 66 mL/min/{1.73_m2} (ref >59)
GLUCOSE: 84 mg/dL (ref 65–99)
Potassium: 3.7 mmol/L (ref 3.5–5.2)
SODIUM: 141 mmol/L (ref 134–144)

## 2018-11-21 LAB — HEPATIC FUNCTION PANEL
ALBUMIN: 4.6 g/dL (ref 3.7–4.7)
ALK PHOS: 67 IU/L (ref 39–117)
ALT: 10 IU/L (ref 0–32)
AST: 13 IU/L (ref 0–40)
BILIRUBIN TOTAL: 1 mg/dL (ref 0.0–1.2)
Bilirubin, Direct: 0.27 mg/dL (ref 0.00–0.40)
TOTAL PROTEIN: 6.9 g/dL (ref 6.0–8.5)

## 2018-12-01 ENCOUNTER — Other Ambulatory Visit: Payer: Self-pay | Admitting: Cardiovascular Disease

## 2018-12-09 ENCOUNTER — Other Ambulatory Visit: Payer: Self-pay | Admitting: Cardiovascular Disease

## 2018-12-11 ENCOUNTER — Other Ambulatory Visit: Payer: Self-pay | Admitting: Cardiology

## 2018-12-11 MED ORDER — SPIRONOLACTONE 50 MG PO TABS: 50.0000 mg | ORAL_TABLET | Freq: Every day | ORAL | 0 refills | Status: DC

## 2018-12-25 ENCOUNTER — Telehealth: Payer: Self-pay | Admitting: Cardiovascular Disease

## 2018-12-25 MED ORDER — SPIRONOLACTONE 50 MG PO TABS: 50.0000 mg | ORAL_TABLET | Freq: Every day | ORAL | 3 refills | Status: DC

## 2018-12-25 MED ORDER — RIVAROXABAN 20 MG PO TABS: 20.0000 mg | ORAL_TABLET | Freq: Every day | ORAL | 3 refills | Status: DC

## 2018-12-25 MED ORDER — METOPROLOL SUCCINATE ER 25 MG PO TB24: 25.0000 mg | ORAL_TABLET | Freq: Two times a day (BID) | ORAL | 3 refills | Status: DC

## 2018-12-25 NOTE — Telephone Encounter (Signed)
Spoke with patient who states her refill was denied because she has not seen Dr. Curt Bears recently. I advised that I can refill her medications for 1 year because she just had her annual appointment with Dr. Acie Fredrickson in February. She verbalized understanding and gratitude for the call.

## 2018-12-25 NOTE — Telephone Encounter (Signed)
New Message         Patient is calling in today because she states there is some confusing with her medication, she would like a call back concerning this matter.

## 2019-10-30 ENCOUNTER — Ambulatory Visit: Payer: Medicare PPO | Attending: Internal Medicine

## 2019-10-30 DIAGNOSIS — Z23 Encounter for immunization: Secondary | ICD-10-CM | POA: Insufficient documentation

## 2019-10-30 NOTE — Progress Notes (Signed)
   Covid-19 Vaccination Clinic  Name:  Yolanda Bell    MRN: [id number] DOB: Sep 17, 1941  10/30/2019  [name] was observed post Covid-19 immunization for 30 minutes based on pre-vaccination screening without incidence. She was provided with Vaccine Information Sheet and instruction to access the V-Safe system.   [name] was instructed to call 911 with any severe reactions post vaccine: Marland Kitchen Difficulty breathing  . Swelling of your face and throat  . A fast heartbeat  . A bad rash all over your body  . Dizziness and weakness    Immunizations Administered    Name Date Dose VIS Date Route   Pfizer COVID-19 Vaccine 10/30/2019 12:55 PM 0.3 mL 09/21/2019 Intramuscular   Manufacturer: [place]   Lot: [id number]   [place]: [id number]

## 2019-11-19 ENCOUNTER — Ambulatory Visit: Payer: Medicare PPO | Attending: Internal Medicine

## 2019-11-19 DIAGNOSIS — Z23 Encounter for immunization: Secondary | ICD-10-CM | POA: Insufficient documentation

## 2019-11-19 NOTE — Progress Notes (Signed)
   Covid-19 Vaccination Clinic  Name:  Yolanda Bell    MRN: [id number] DOB: February 06, 1941  11/19/2019  [name] was observed post Covid-19 immunization for 15 minutes without incidence. She was provided with Vaccine Information Sheet and instruction to access the V-Safe system.   [name] was instructed to call 911 with any severe reactions post vaccine: Marland Kitchen Difficulty breathing  . Swelling of your face and throat  . A fast heartbeat  . A bad rash all over your body  . Dizziness and weakness    Immunizations Administered    Name Date Dose VIS Date Route   Pfizer COVID-19 Vaccine 11/19/2019  1:05 PM 0.3 mL 09/21/2019 Intramuscular   Manufacturer: [place]   Lot: [id number]   [place]: [id number]

## 2019-11-21 ENCOUNTER — Encounter: Payer: Self-pay | Admitting: Cardiovascular Disease

## 2019-11-21 ENCOUNTER — Other Ambulatory Visit: Payer: Self-pay

## 2019-11-21 ENCOUNTER — Ambulatory Visit: Payer: Medicare PPO | Admitting: Cardiovascular Disease

## 2019-11-21 VITALS — BP 122/72 | HR 79 | Ht 64.0 in | Wt 159.8 lb

## 2019-11-21 DIAGNOSIS — I480 Paroxysmal atrial fibrillation: Secondary | ICD-10-CM

## 2019-11-21 DIAGNOSIS — E782 Mixed hyperlipidemia: Secondary | ICD-10-CM | POA: Diagnosis not present

## 2019-11-21 LAB — BASIC METABOLIC PANEL
BUN/Creatinine Ratio: 15 (ref 12–28)
BUN: 14 mg/dL (ref 8–27)
CO2: 25 mmol/L (ref 20–29)
Calcium: 9.4 mg/dL (ref 8.7–10.3)
Chloride: 98 mmol/L (ref 96–106)
Creatinine, Ser: 0.96 mg/dL (ref 0.57–1.00)
GFR calc Af Amer: 66 mL/min/{1.73_m2} (ref >59)
GFR calc non Af Amer: 58 mL/min/{1.73_m2} — ABNORMAL LOW (ref >59)
Glucose: 102 mg/dL — ABNORMAL HIGH (ref 65–99)
Potassium: 3.8 mmol/L (ref 3.5–5.2)
Sodium: 139 mmol/L (ref 134–144)

## 2019-11-21 LAB — LIPID PANEL
Chol/HDL Ratio: 2.9 ratio (ref 0.0–4.4)
Cholesterol, Total: 172 mg/dL (ref 100–199)
HDL: 59 mg/dL (ref >39)
LDL Chol Calc (NIH): 96 mg/dL (ref 0–99)
Triglycerides: 96 mg/dL (ref 0–149)
VLDL Cholesterol Cal: 17 mg/dL (ref 5–40)

## 2019-11-21 LAB — HEPATIC FUNCTION PANEL
ALT: 8 IU/L (ref 0–32)
AST: 18 IU/L (ref 0–40)
Albumin: 4.5 g/dL (ref 3.7–4.7)
Alkaline Phosphatase: 87 IU/L (ref 39–117)
Bilirubin Total: 0.9 mg/dL (ref 0.0–1.2)
Bilirubin, Direct: 0.26 mg/dL (ref 0.00–0.40)
Total Protein: 6.9 g/dL (ref 6.0–8.5)

## 2019-11-21 NOTE — Patient Instructions (Signed)
Medication Instructions:  Your physician recommends that you continue on your current medications as directed. Please refer to the Current Medication list given to you today.  *If you need a refill on your cardiac medications before your next appointment, please call your pharmacy*  Lab Work: BMET, Lipid and Liver  If you have labs (blood work) drawn today and your tests are completely normal, you will receive your results only by: Marland Kitchen MyChart Message (if you have MyChart) OR . A paper copy in the mail If you have any lab test that is abnormal or we need to change your treatment, we will call you to review the results.  Testing/Procedures: None  Follow-Up: At [facility], you and your health needs are our priority.  As part of our continuing mission to provide you with exceptional heart care, we have created designated Provider Care Teams.  These Care Teams include your primary Cardiologist (physician) and Advanced Practice Providers (APPs -  Physician Assistants and Nurse Practitioners) who all work together to provide you with the care you need, when you need it.  Your next appointment:   12 month(s)  The format for your next appointment:   In Person  Provider:   You may see Mertie Moores, MD or one of the following Advanced Practice Providers on your designated Care Team:    Richardson Dopp, PA-C  [place], [place]  Daune Perch, NP   Other Instructions

## 2019-11-21 NOTE — Progress Notes (Signed)
Cardiology Office Note   Date:  11/21/2019   ID:  Yolanda Bell, Yolanda Bell 1940/10/27, MRN [id number]  PCP:  Hulan Fess, MD  Cardiologist:   Mertie Moores, MD   Chief Complaint  Patient presents with  . Atrial Fibrillation  . Hypertension   1. Constrictive Pericarditis 2. A-Flutter / Fibrillation 3. Gout 4. Spastic dysphonia 5. SVT 6. HTN  History of Present Illness:  She has done well from a cardiac standpoint. She has had a flare up of gout which has affected both feet. She has been taking indocin for the flare ups.   She had a bad accident in February. She was getting off an airplane and tripped on the off ramp in [place]. She broke both shoulders, left thumb and had soft tissue injury to her left foot. She was treated conservatively (bilateral arm slings). Her left thumb fracture was not found until she returned to [place]. She had some tachypalpitations for 22 hours during her recovery. Her HR was 130-140 for 22 hours and resolved spontaneously. I suspect she had a brief episode of atrial flutter.  She was quite emotional when she was telling me the issues of her fall. It is apparently still is quite upsetting to her.  October 19, 2012: She presents today for further evaluation of atrial fib / flutter that was found at cardiac rehab. She really cannot tell that her HR is fast and irregular. She can feel her HR when she is lying down. She is asymptomatic when she is up and busy doing chores. She denies any chest pain or dyspnea.   Feb. 4, 2014: Yolanda Bell seems to have rapid atrial fibrillation. [name] on relatively high dose of metoprolol XL and also is on when necessary propranolol. He is tolerating the atrial ablation but certainly does not feel as well as she would like to. [name] here to schedule her cardioversion next week. She denies any angina. She does have some shortness of breath.  January 01, 2013:  She has maintained NSR since her  cardioversion ( Feb. 11, 2014) . She was quite frustrated with her recent Out patient area / endoscopy department cardioversion. Lots of computer issues / scheduling issues.   She has done well from a cardiac standpoint.   Sept. 22, 2014:  She has done well. She had some redness and tenderness just below her right eye. She is not having any cardiac problems.   January 14, 2014: She has done well. She had a brief episode of tachycardia that lasted for several days. Sounds like an episode of SVT. She tried some propranolol and eventually resolved after about 5-6 hours.   June 07, 2014:  [name] presents with several weeks of palpitations. She came in today and was noted to be in atrial fibrillation. She started back on her xarelto 4 days ago. Her HR is well controlled  Oct. 5, 2015:  [name] had a cardioversion August 31. She had a brief episode of irregular Hr - she took some propraoolol. . She thinks that she eventually converted back to NSR. In looking back , these were probably PVCs.   May , 10 , 2016;   Yolanda Bell is a 79 y.o. female who presents for follow up of her atrial fib She was cardioverted last august.   She was found to have a few RBCs in a UA - no obvious blood   Nov. 7, 2016:  Doing well from a cardiac standpoint.  No CP , no dyspnea.  Feb 26, 2016: Feeling well  Needs laser surgery on her right eye.  Doing well.   Feb.  6, 2019: Doing well from a cardiac standpoint.   Has some bleeding folowing oral surgery .  She bled for 4 days.   Only issue is her spastic dysphonia   Feb. 10, 2020: On Jan. 20, 2020, she developed and irregular HR .   took Metoprolol 50 PO .   Felt better after several hours.   Has sinus brady with PACs on ECG  .  Is on Xarelto .  Several days later developed a brief pinching sensation in her chest    November 21, 2019: Yolanda Bell is seen today for follow-up visit.  She has a history of atrial fibrillation and is  status post ablation.  She is on Xarelto. She has been doing well.  She is not had any symptoms of Covid.  She received her second vaccine this past Monday. Has been on torsemide.   She did not respond well to furosemide She is going to a casino in [place] tomorrow. By plane    Past Medical History:  Diagnosis Date  . Atrial fibrillation ([place])    Status post TEE cardioversion  . Constrictive pericarditis    2 status post pericardial stripping at the [facility]  . Gout   . Hypertension   . Spastic dysphonia   . Supraventricular tachycardia ([place])    Status post RF ablation by Dr. Peterson Lombard    Past Surgical History:  Procedure Laterality Date  . ABDOMINAL HYSTERECTOMY     1987  . CARDIOVERSION  11/21/2012   Procedure: CARDIOVERSION;  Surgeon: Darlin Coco, MD;  Location: [place];  Service: Cardiovascular;;  . CARDIOVERSION N/A 06/10/2014   Procedure: CARDIOVERSION;  Surgeon: Thayer Headings, MD;  [facility];  Service: Cardiovascular;  Laterality: N/A;  . CARDIOVERSION N/A 10/14/2015   Procedure: CARDIOVERSION;  Surgeon: Josue Hector, MD;  Location: [facility] ENDOSCOPY;  Service: Cardiovascular;  Laterality: N/A;  . CARDIOVERSION N/A 05/12/2016   Procedure: CARDIOVERSION;  Surgeon: Skeet Latch, MD;  Location: [facility] ENDOSCOPY;  Service: Cardiovascular;  Laterality: N/A;  . CHOLECYSTECTOMY     1989  . ELECTROPHYSIOLOGIC STUDY N/A 08/20/2016   Procedure: Atrial Fibrillation Ablation;  Surgeon: [name], MD;  Location: [place] CV LAB;  Service: Cardiovascular;  Laterality: N/A;  . EYE SURGERY     cataracts  . PERICARDIECTOMY    . TEE WITHOUT CARDIOVERSION N/A 06/10/2014   Procedure: TRANSESOPHAGEAL ECHOCARDIOGRAM (TEE);  Surgeon: Thayer Headings, MD;  Location: [place];  Service: Cardiovascular;  Laterality: N/A;  . TONSILLECTOMY       Current Outpatient Medications  Medication Sig Dispense Refill  . alendronate (FOSAMAX) 70 MG tablet Take  70 mg by mouth once a week. On Saturday or Sunday  4  . Ascorbic Acid (VITAMIN C PO) Take 1 tablet by mouth daily.     . Gluc-Chonn-MSM-Boswellia-Vit D (GLUCOSAMINE CHOND TRIPLE/VIT D) TABS Take 1 tablet by mouth daily.     . indomethacin (INDOCIN) 25 MG capsule Take 25 mg by mouth as needed. For gout    . KLOR-CON M10 10 MEQ tablet TAKE 1 TABLET (10 MEQ TOTAL) BY MOUTH DAILY. 90 tablet 3  . metoprolol succinate (TOPROL XL) 25 MG 24 hr tablet Take 1 tablet (25 mg total) by mouth 2 (two) times daily. 180 tablet 3  . rivaroxaban (XARELTO) 20 MG TABS tablet Take 1 tablet (20 mg total)  by mouth daily with supper. 90 tablet 3  . spironolactone (ALDACTONE) 50 MG tablet Take 1 tablet (50 mg total) by mouth daily. 90 tablet 3  . torsemide (DEMADEX) 20 MG tablet TAKE 1 TABLET (20 MG TOTAL) BY MOUTH DAILY. 90 tablet 3   No current facility-administered medications for this visit.    Allergies:   Allopurinol, Amiodarone, Other, and Erythromycin    Social History:  The patient  reports that she has never smoked. She has never used smokeless tobacco. She reports current alcohol use. She reports that she does not use drugs.   Family History:  The patient's family history includes Breast cancer in her sister; Emphysema in her father; Heart disease in her mother.    ROS: Noted in current history.  Otherwise negative.   Physical Exam: Blood pressure 122/72, pulse 79, height 5\' 4"  (1.626 m), weight 159 lb 12.8 oz (72.5 kg), SpO2 99 %.  GEN:  Well nourished, well developed in no acute distress HEENT: Normal NECK: No JVD; No carotid bruits LYMPHATICS: No lymphadenopathy CARDIAC: RRR , no murmurs, rubs, gallops RESPIRATORY:  Clear to auscultation without rales, wheezing or rhonchi  ABDOMEN: Soft, non-tender, non-distended MUSCULOSKELETAL:  No edema; No deformity  SKIN: Warm and dry NEUROLOGIC:  Alert and oriented x 3   EKG:  NSR at 79.  No ST o rT wave changes.    Recent Labs: 11/21/2019: ALT 8;  BUN 14; Creatinine, Ser 0.96; Potassium 3.8; Sodium 139    Lipid Panel    Component Value Date/Time   CHOL 172 11/21/2019 1035   TRIG 96 11/21/2019 1035   HDL 59 11/21/2019 1035   CHOLHDL 2.9 11/21/2019 1035   CHOLHDL 3.4 08/18/2015 1147   VLDL 17 08/18/2015 1147   LDLCALC 96 11/21/2019 1035      Wt Readings from Last 3 Encounters:  11/21/19 159 lb 12.8 oz (72.5 kg)  11/20/18 158 lb (71.7 kg)  11/16/17 170 lb (77.1 kg)      Other studies Reviewed: Additional studies/ records that were reviewed today include: . Review of the above records demonstrates:    ASSESSMENT AND PLAN:  1. Constrictive Pericarditis -status post pericardectomy.    No recurrent symptoms  2. A-Flutter / Fibrillation -status post ablation.   Is maintaining NSR   3. Gout-      4.Spastic dysphonia -  Stable   5. SVT - no further episodes   6. HTN -   BP is well controlled.    Current medicines are reviewed at length with the patient today.  The patient does not have concerns regarding medicines.  The following changes have been made:  no change  Labs/ tests ordered today include:   Orders Placed This Encounter  Procedures  . Lipid panel  . Hepatic function panel  . Basic metabolic panel  . EKG 12-Lead     Disposition:   FU with me in 1 year      Mertie Moores, MD  11/21/2019 5:40 PM    [facility] [address], [place], [place]  [address] Phone: [phone]; Fax: [phone]

## 2019-11-26 ENCOUNTER — Other Ambulatory Visit: Payer: Self-pay | Admitting: Cardiovascular Disease

## 2019-12-13 ENCOUNTER — Other Ambulatory Visit: Payer: Self-pay | Admitting: Cardiovascular Disease

## 2020-01-02 ENCOUNTER — Other Ambulatory Visit: Payer: Self-pay | Admitting: Cardiovascular Disease

## 2020-03-09 ENCOUNTER — Other Ambulatory Visit: Payer: Self-pay | Admitting: Cardiovascular Disease

## 2020-03-20 ENCOUNTER — Other Ambulatory Visit: Payer: Self-pay | Admitting: Cardiovascular Disease

## 2020-03-20 NOTE — Telephone Encounter (Signed)
Xarelto 20mg  refill request received. Pt is 79 years old, weight-72.5kg, Crea-0.96 on 11/21/2019, last seen by Dr. Acie Fredrickson on 11/21/2019, Diagnosis-Afib, CrCl-56.51ml/min; Dose is appropriate based on dosing criteria. Will send in refill to requested pharmacy.

## 2020-06-17 DIAGNOSIS — M1711 Unilateral primary osteoarthritis, right knee: Secondary | ICD-10-CM | POA: Diagnosis not present

## 2020-06-17 DIAGNOSIS — M25561 Pain in right knee: Secondary | ICD-10-CM | POA: Diagnosis not present

## 2020-06-27 DIAGNOSIS — M810 Age-related osteoporosis without current pathological fracture: Secondary | ICD-10-CM | POA: Diagnosis not present

## 2020-08-12 DIAGNOSIS — M545 Low back pain, unspecified: Secondary | ICD-10-CM | POA: Diagnosis not present

## 2020-08-12 DIAGNOSIS — S32000A Wedge compression fracture of unspecified lumbar vertebra, initial encounter for closed fracture: Secondary | ICD-10-CM | POA: Diagnosis not present

## 2020-08-26 DIAGNOSIS — M4856XD Collapsed vertebra, not elsewhere classified, lumbar region, subsequent encounter for fracture with routine healing: Secondary | ICD-10-CM | POA: Diagnosis not present

## 2020-09-15 ENCOUNTER — Other Ambulatory Visit: Payer: Self-pay | Admitting: Cardiovascular Disease

## 2020-09-16 DIAGNOSIS — M5459 Other low back pain: Secondary | ICD-10-CM | POA: Diagnosis not present

## 2020-09-16 DIAGNOSIS — M545 Low back pain, unspecified: Secondary | ICD-10-CM | POA: Diagnosis not present

## 2020-09-30 DIAGNOSIS — M4856XD Collapsed vertebra, not elsewhere classified, lumbar region, subsequent encounter for fracture with routine healing: Secondary | ICD-10-CM | POA: Diagnosis not present

## 2020-10-21 DIAGNOSIS — M4856XD Collapsed vertebra, not elsewhere classified, lumbar region, subsequent encounter for fracture with routine healing: Secondary | ICD-10-CM | POA: Diagnosis not present

## 2020-10-21 DIAGNOSIS — M810 Age-related osteoporosis without current pathological fracture: Secondary | ICD-10-CM | POA: Diagnosis not present

## 2020-11-21 DIAGNOSIS — Z961 Presence of intraocular lens: Secondary | ICD-10-CM | POA: Diagnosis not present

## 2020-12-02 DIAGNOSIS — M4856XD Collapsed vertebra, not elsewhere classified, lumbar region, subsequent encounter for fracture with routine healing: Secondary | ICD-10-CM | POA: Diagnosis not present

## 2020-12-04 ENCOUNTER — Other Ambulatory Visit: Payer: Self-pay | Admitting: Cardiovascular Disease

## 2020-12-15 ENCOUNTER — Other Ambulatory Visit: Payer: Self-pay

## 2020-12-15 ENCOUNTER — Encounter: Payer: Self-pay | Admitting: Cardiovascular Disease

## 2020-12-15 ENCOUNTER — Ambulatory Visit: Payer: Medicare PPO | Admitting: Cardiovascular Disease

## 2020-12-15 VITALS — BP 114/74 | HR 75 | Ht 64.0 in | Wt 150.8 lb

## 2020-12-15 DIAGNOSIS — I480 Paroxysmal atrial fibrillation: Secondary | ICD-10-CM

## 2020-12-15 MED ORDER — POTASSIUM CHLORIDE CRYS ER 10 MEQ PO TBCR: 10.0000 meq | EXTENDED_RELEASE_TABLET | Freq: Every day | ORAL | 3 refills | Status: DC

## 2020-12-15 MED ORDER — SPIRONOLACTONE 50 MG PO TABS: 50.0000 mg | ORAL_TABLET | Freq: Every day | ORAL | 3 refills | Status: DC

## 2020-12-15 MED ORDER — TORSEMIDE 20 MG PO TABS: 20.0000 mg | ORAL_TABLET | Freq: Every day | ORAL | 3 refills | Status: DC

## 2020-12-15 NOTE — Patient Instructions (Signed)
Medication Instructions:  Your physician recommends that you continue on your current medications as directed. Please refer to the Current Medication list given to you today.  *If you need a refill on your cardiac medications before your next appointment, please call your pharmacy*   Lab Work: none   Testing/Procedures: none   Follow-Up: At [facility], you and your health needs are our priority.  As part of our continuing mission to provide you with exceptional heart care, we have created designated Provider Care Teams.  These Care Teams include your primary Cardiologist (physician) and Advanced Practice Providers (APPs -  Physician Assistants and Nurse Practitioners) who all work together to provide you with the care you need, when you need it.  Your next appointment:   1 year(s)  The format for your next appointment:   In Person  Provider:   You may see Mertie Moores, MD or one of the following Advanced Practice Providers on your designated Care Team:    Richardson Dopp, PA-C  Robbie Lis, [place]    Other Instructions none

## 2020-12-15 NOTE — Progress Notes (Signed)
Cardiology Office Note   Date:  12/15/2020   ID:  Yolanda Bell, Yolanda Bell [birth year], MRN [id number]  PCP:  Hulan Fess, MD  Cardiologist:   Mertie Moores, MD   Chief Complaint  Patient presents with  . Atrial Fibrillation  . Hypertension   1. Constrictive Pericarditis 2. A-Flutter / Fibrillation 3. Gout 4. Spastic dysphonia 5. SVT 6. HTN     She has done well from a cardiac standpoint. She has had a flare up of gout which has affected both feet. She has been taking indocin for the flare ups.   She had a bad accident in February. She was getting off an airplane and tripped on the off ramp in [place]. She broke both shoulders, left thumb and had soft tissue injury to her left foot. She was treated conservatively (bilateral arm slings). Her left thumb fracture was not found until she returned to [place]. She had some tachypalpitations for 22 hours during her recovery. Her HR was 130-140 for 22 hours and resolved spontaneously. I suspect she had a brief episode of atrial flutter.  She was quite emotional when she was telling me the issues of her fall. It is apparently still is quite upsetting to her.  October 19, 2012: She presents today for further evaluation of atrial fib / flutter that was found at cardiac rehab. She really cannot tell that her HR is fast and irregular. She can feel her HR when she is lying down. She is asymptomatic when she is up and busy doing chores. She denies any chest pain or dyspnea.   Feb. 4, 2014: Yolanda Bell seems to have rapid atrial fibrillation. [name] on relatively high dose of metoprolol XL and also is on when necessary propranolol. He is tolerating the atrial ablation but certainly does not feel as well as she would like to. [name] here to schedule her cardioversion next week. She denies any angina. She does have some shortness of breath.  January 01, 2013:  She has maintained NSR since her cardioversion ( Feb. 11, 2014) . She  was quite frustrated with her recent Out patient area / endoscopy department cardioversion. Lots of computer issues / scheduling issues.   She has done well from a cardiac standpoint.   Sept. 22, 2014:  She has done well. She had some redness and tenderness just below her right eye. She is not having any cardiac problems.   January 14, 2014: She has done well. She had a brief episode of tachycardia that lasted for several days. Sounds like an episode of SVT. She tried some propranolol and eventually resolved after about 5-6 hours.   June 07, 2014:  Yolanda Bell presents with several weeks of palpitations. She came in today and was noted to be in atrial fibrillation. She started back on her xarelto 4 days ago. Her HR is well controlled  Oct. 5, 2015:  Yolanda Bell had a cardioversion August 31. She had a brief episode of irregular Hr - she took some propraoolol. . She thinks that she eventually converted back to NSR. In looking back , these were probably PVCs.   May , 10 , 2016;   Yolanda Bell is a 80 y.o. female who presents for follow up of her atrial fib She was cardioverted last august.   She was found to have a few RBCs in a UA - no obvious blood   Nov. 7, 2016:  Doing well from a cardiac standpoint.  No CP , no dyspnea.   Feb 26, 2016: Feeling well  Needs laser surgery on her right eye.  Doing well.   Feb.  6, 2019: Doing well from a cardiac standpoint.   Has some bleeding folowing oral surgery .  She bled for 4 days.   Only issue is her spastic dysphonia   Feb. 10, 2020: On Jan. 20, 2020, she developed and irregular HR .   took Metoprolol 50 PO .   Felt better after several hours.   Has sinus brady with PACs on ECG  .  Is on Xarelto .  Several days later developed a brief pinching sensation in her chest    November 21, 2019: Yolanda Bell is seen today for follow-up visit.  She has a history of atrial fibrillation and is status post ablation.  She is on  Xarelto. She has been doing well.  She is not had any symptoms of Covid.  She received her second vaccine this past Monday. Has been on torsemide.   She did not respond well to furosemide She is going to a casino in [place] tomorrow. By plane   December 15, 2020 Yolanda Bell is doing well.   Having some knee issues.  No cardiac issues  Is in normal rhythm  Fell during a dog walking accident  And has a compression fx of a vertebra Has healed up now ,  Still having some buttock pain     Past Medical History:  Diagnosis Date  . Atrial fibrillation ([place])    Status post TEE cardioversion  . Constrictive pericarditis    2 status post pericardial stripping at the [facility]  . Gout   . Hypertension   . Spastic dysphonia   . Supraventricular tachycardia ([place])    Status post RF ablation by Dr. Peterson Lombard    Past Surgical History:  Procedure Laterality Date  . ABDOMINAL HYSTERECTOMY     1987  . CARDIOVERSION  11/21/2012   Procedure: CARDIOVERSION;  Surgeon: Darlin Coco, MD;  Location: [facility];  Service: Cardiovascular;;  . CARDIOVERSION N/A 06/10/2014   Procedure: CARDIOVERSION;  Surgeon: Thayer Headings, MD;  Location: [facility] ENDOSCOPY;  Service: Cardiovascular;  Laterality: N/A;  . CARDIOVERSION N/A 10/14/2015   Procedure: CARDIOVERSION;  Surgeon: Josue Hector, MD;  Location: [facility] ENDOSCOPY;  Service: Cardiovascular;  Laterality: N/A;  . CARDIOVERSION N/A 05/12/2016   Procedure: CARDIOVERSION;  Surgeon: Skeet Latch, MD;  Location: [facility] ENDOSCOPY;  Service: Cardiovascular;  Laterality: N/A;  . CHOLECYSTECTOMY     1989  . ELECTROPHYSIOLOGIC STUDY N/A 08/20/2016   Procedure: Atrial Fibrillation Ablation;  Surgeon: [name], MD;  Location: [place] CV LAB;  Service: Cardiovascular;  Laterality: N/A;  . EYE SURGERY     cataracts  . PERICARDIECTOMY    . TEE WITHOUT CARDIOVERSION N/A 06/10/2014   Procedure: TRANSESOPHAGEAL ECHOCARDIOGRAM (TEE);  Surgeon: Thayer Headings,  MD;  Location: [place];  Service: Cardiovascular;  Laterality: N/A;  . TONSILLECTOMY       Current Outpatient Medications  Medication Sig Dispense Refill  . Ascorbic Acid (VITAMIN C PO) Take 1 tablet by mouth daily.     . Ascorbic Acid (VITAMIN C) 500 MG CAPS Take 1 tablet by mouth daily.    . Cholecalciferol (VITAMIN D) 125 MCG (5000 UT) CAPS Take 1 tablet by mouth daily.    Marland Kitchen glucosamine-chondroitin 500-400 MG tablet Take 1 tablet by mouth daily.    . indomethacin (INDOCIN) 25 MG capsule Take 25 mg by mouth as needed. For gout    .  metoprolol succinate (TOPROL-XL) 25 MG 24 hr tablet TAKE 1 TABLET BY MOUTH TWICE A DAY 180 tablet 1  . XARELTO 20 MG TABS tablet TAKE 1 TABLET (20 MG TOTAL) BY MOUTH DAILY WITH SUPPER. 90 tablet 2  . potassium chloride (KLOR-CON M10) 10 MEQ tablet Take 1 tablet (10 mEq total) by mouth daily. 90 tablet 3  . spironolactone (ALDACTONE) 50 MG tablet Take 1 tablet (50 mg total) by mouth daily. 90 tablet 3  . torsemide (DEMADEX) 20 MG tablet Take 1 tablet (20 mg total) by mouth daily. 90 tablet 3   No current facility-administered medications for this visit.    Allergies:   Allopurinol, Amiodarone, Other, and Erythromycin    Social History:  The patient  reports that she has never smoked. She has never used smokeless tobacco. She reports current alcohol use. She reports that she does not use drugs.   Family History:  The patient's family history includes Breast cancer in her sister; Emphysema in her father; Heart disease in her mother.    ROS: Noted in current history.  Otherwise negative.  Physical Exam: Blood pressure 114/74, pulse 75, height 5\' 4"  (1.626 m), weight 150 lb 12.8 oz (68.4 kg), SpO2 100 %.  GEN:  Well nourished, well developed in no acute distress HEENT: Normal NECK: No JVD; No carotid bruits LYMPHATICS: No lymphadenopathy CARDIAC: RRR , occasional premature beats  RESPIRATORY:  Clear to auscultation without rales, wheezing or  rhonchi  ABDOMEN: Soft, non-tender, non-distended MUSCULOSKELETAL:  No edema; No deformity  SKIN: Warm and dry NEUROLOGIC:  Alert and oriented x 3   EKG:   December 15, 2020: Normal sinus rhythm at 75.  Non specific ST / T wave changes..    Recent Labs: No results found for requested labs within last 8760 hours.    Lipid Panel    Component Value Date/Time   CHOL 172 11/21/2019 1035   TRIG 96 11/21/2019 1035   HDL 59 11/21/2019 1035   CHOLHDL 2.9 11/21/2019 1035   CHOLHDL 3.4 08/18/2015 1147   VLDL 17 08/18/2015 1147   LDLCALC 96 11/21/2019 1035      Wt Readings from Last 3 Encounters:  12/15/20 150 lb 12.8 oz (68.4 kg)  11/21/19 159 lb 12.8 oz (72.5 kg)  11/20/18 158 lb (71.7 kg)      Other studies Reviewed: Additional studies/ records that were reviewed today include: . Review of the above records demonstrates:    ASSESSMENT AND PLAN:  1. Constrictive Pericarditis -status post pericardectomy.      Overall doing well.  2. A-Flutter / Fibrillation -  Has maintained NSR , cont meds.   3. Gout-      4.Spastic dysphonia -   Seems to be stabel   5. SVT  - no recurrent SVT   6. HTN -   BP is well controlled.   Current medicines are reviewed at length with the patient today.  The patient does not have concerns regarding medicines.  The following changes have been made:  no change  Labs/ tests ordered today include:   Orders Placed This Encounter  Procedures  . EKG 12-Lead     Disposition:   FU with me in 1 year      Mertie Moores, MD  12/15/2020 4:22 PM    [facility] [place], [place], [place]  [address] Phone: [phone]; Fax: [phone]

## 2021-01-28 ENCOUNTER — Other Ambulatory Visit: Payer: Self-pay | Admitting: Cardiovascular Disease

## 2021-01-28 NOTE — Telephone Encounter (Signed)
Prescription refill request for Xarelto received.   Indication: AFIB Last office visit: [place], 12/15/2020 Weight: 68.4 kg  Age: 80 yo  Scr: 0.96, 11/21/2019 CrCl: 12ml/min   Pt is on the correct dose of Xarelto per dosing criteria, prescription refill sent for Xarelto 20mg  daily.

## 2021-05-31 ENCOUNTER — Other Ambulatory Visit: Payer: Self-pay | Admitting: Cardiovascular Disease

## 2021-06-20 ENCOUNTER — Emergency Department (HOSPITAL_COMMUNITY): Payer: Medicare PPO

## 2021-06-20 ENCOUNTER — Other Ambulatory Visit: Payer: Self-pay

## 2021-06-20 ENCOUNTER — Encounter (HOSPITAL_COMMUNITY): Payer: Self-pay

## 2021-06-20 ENCOUNTER — Inpatient Hospital Stay (HOSPITAL_COMMUNITY)
Admission: EM | Admit: 2021-06-20 | Discharge: 2021-06-23 | DRG: 312 | Disposition: A | Discharge status: Home / Self Care | Payer: Medicare PPO | Attending: Internal Medicine | Admitting: Internal Medicine

## 2021-06-20 DIAGNOSIS — I2510 Atherosclerotic heart disease of native coronary artery without angina pectoris: Secondary | ICD-10-CM | POA: Diagnosis present

## 2021-06-20 DIAGNOSIS — I517 Cardiomegaly: Secondary | ICD-10-CM | POA: Diagnosis not present

## 2021-06-20 DIAGNOSIS — I4892 Unspecified atrial flutter: Secondary | ICD-10-CM | POA: Diagnosis present

## 2021-06-20 DIAGNOSIS — Z7901 Long term (current) use of anticoagulants: Secondary | ICD-10-CM

## 2021-06-20 DIAGNOSIS — D62 Acute posthemorrhagic anemia: Secondary | ICD-10-CM | POA: Diagnosis present

## 2021-06-20 DIAGNOSIS — D6832 Hemorrhagic disorder due to extrinsic circulating anticoagulants: Secondary | ICD-10-CM | POA: Diagnosis present

## 2021-06-20 DIAGNOSIS — I471 Supraventricular tachycardia: Secondary | ICD-10-CM | POA: Diagnosis present

## 2021-06-20 DIAGNOSIS — R490 Dysphonia: Secondary | ICD-10-CM | POA: Diagnosis present

## 2021-06-20 DIAGNOSIS — Z803 Family history of malignant neoplasm of breast: Secondary | ICD-10-CM

## 2021-06-20 DIAGNOSIS — E785 Hyperlipidemia, unspecified: Secondary | ICD-10-CM | POA: Diagnosis present

## 2021-06-20 DIAGNOSIS — R55 Syncope and collapse: Secondary | ICD-10-CM | POA: Diagnosis present

## 2021-06-20 DIAGNOSIS — W19XXXA Unspecified fall, initial encounter: Secondary | ICD-10-CM | POA: Diagnosis present

## 2021-06-20 DIAGNOSIS — E876 Hypokalemia: Secondary | ICD-10-CM | POA: Diagnosis present

## 2021-06-20 DIAGNOSIS — I959 Hypotension, unspecified: Secondary | ICD-10-CM | POA: Diagnosis not present

## 2021-06-20 DIAGNOSIS — I951 Orthostatic hypotension: Principal | ICD-10-CM | POA: Diagnosis present

## 2021-06-20 DIAGNOSIS — J449 Chronic obstructive pulmonary disease, unspecified: Secondary | ICD-10-CM | POA: Diagnosis present

## 2021-06-20 DIAGNOSIS — I480 Paroxysmal atrial fibrillation: Secondary | ICD-10-CM | POA: Diagnosis present

## 2021-06-20 DIAGNOSIS — Z20822 Contact with and (suspected) exposure to covid-19: Secondary | ICD-10-CM | POA: Diagnosis present

## 2021-06-20 DIAGNOSIS — Z888 Allergy status to other drugs, medicaments and biological substances status: Secondary | ICD-10-CM

## 2021-06-20 DIAGNOSIS — Z825 Family history of asthma and other chronic lower respiratory diseases: Secondary | ICD-10-CM

## 2021-06-20 DIAGNOSIS — S300XXA Contusion of lower back and pelvis, initial encounter: Secondary | ICD-10-CM | POA: Diagnosis present

## 2021-06-20 DIAGNOSIS — S7002XA Contusion of left hip, initial encounter: Secondary | ICD-10-CM | POA: Diagnosis present

## 2021-06-20 DIAGNOSIS — I10 Essential (primary) hypertension: Secondary | ICD-10-CM | POA: Diagnosis present

## 2021-06-20 DIAGNOSIS — T50B95A Adverse effect of other viral vaccines, initial encounter: Secondary | ICD-10-CM | POA: Diagnosis present

## 2021-06-20 DIAGNOSIS — R000 Tachycardia, unspecified: Secondary | ICD-10-CM | POA: Diagnosis not present

## 2021-06-20 DIAGNOSIS — Z881 Allergy status to other antibiotic agents status: Secondary | ICD-10-CM

## 2021-06-20 DIAGNOSIS — M109 Gout, unspecified: Secondary | ICD-10-CM | POA: Diagnosis present

## 2021-06-20 DIAGNOSIS — R4020 Unspecified coma: Secondary | ICD-10-CM | POA: Diagnosis not present

## 2021-06-20 DIAGNOSIS — R739 Hyperglycemia, unspecified: Secondary | ICD-10-CM | POA: Diagnosis not present

## 2021-06-20 DIAGNOSIS — R42 Dizziness and giddiness: Secondary | ICD-10-CM | POA: Diagnosis not present

## 2021-06-20 DIAGNOSIS — G9389 Other specified disorders of brain: Secondary | ICD-10-CM | POA: Diagnosis not present

## 2021-06-20 DIAGNOSIS — Z79899 Other long term (current) drug therapy: Secondary | ICD-10-CM

## 2021-06-20 DIAGNOSIS — Z8249 Family history of ischemic heart disease and other diseases of the circulatory system: Secondary | ICD-10-CM

## 2021-06-20 DIAGNOSIS — E1165 Type 2 diabetes mellitus with hyperglycemia: Secondary | ICD-10-CM | POA: Diagnosis present

## 2021-06-20 DIAGNOSIS — I311 Chronic constrictive pericarditis: Secondary | ICD-10-CM | POA: Diagnosis present

## 2021-06-20 DIAGNOSIS — S0990XA Unspecified injury of head, initial encounter: Secondary | ICD-10-CM | POA: Diagnosis not present

## 2021-06-20 DIAGNOSIS — K7460 Unspecified cirrhosis of liver: Secondary | ICD-10-CM | POA: Diagnosis present

## 2021-06-20 DIAGNOSIS — G319 Degenerative disease of nervous system, unspecified: Secondary | ICD-10-CM | POA: Diagnosis not present

## 2021-06-20 DIAGNOSIS — T148XXA Other injury of unspecified body region, initial encounter: Secondary | ICD-10-CM | POA: Diagnosis not present

## 2021-06-20 DIAGNOSIS — Z043 Encounter for examination and observation following other accident: Secondary | ICD-10-CM | POA: Diagnosis not present

## 2021-06-20 DIAGNOSIS — K760 Fatty (change of) liver, not elsewhere classified: Secondary | ICD-10-CM | POA: Diagnosis not present

## 2021-06-20 LAB — COMPREHENSIVE METABOLIC PANEL
ALT: 15 U/L (ref 0–44)
AST: 23 U/L (ref 15–41)
Albumin: 3.8 g/dL (ref 3.5–5.0)
Alkaline Phosphatase: 47 U/L (ref 38–126)
Anion gap: 15 (ref 5–15)
BUN: 16 mg/dL (ref 8–23)
CO2: 21 mmol/L — ABNORMAL LOW (ref 22–32)
Calcium: 8.9 mg/dL (ref 8.9–10.3)
Chloride: 102 mmol/L (ref 98–111)
Creatinine, Ser: 1.14 mg/dL — ABNORMAL HIGH (ref 0.44–1.00)
GFR, Estimated: 49 mL/min — ABNORMAL LOW (ref >60)
Glucose, Bld: 194 mg/dL — ABNORMAL HIGH (ref 70–99)
Potassium: 3.5 mmol/L (ref 3.5–5.1)
Sodium: 138 mmol/L (ref 135–145)
Total Bilirubin: 1.1 mg/dL (ref 0.3–1.2)
Total Protein: 6.3 g/dL — ABNORMAL LOW (ref 6.5–8.1)

## 2021-06-20 LAB — I-STAT CHEM 8, ED
BUN: 14 mg/dL (ref 8–23)
Calcium, Ion: 1.08 mmol/L — ABNORMAL LOW (ref 1.15–1.40)
Chloride: 101 mmol/L (ref 98–111)
Creatinine, Ser: 1.1 mg/dL — ABNORMAL HIGH (ref 0.44–1.00)
Glucose, Bld: 201 mg/dL — ABNORMAL HIGH (ref 70–99)
HCT: 34 % — ABNORMAL LOW (ref 36.0–46.0)
Hemoglobin: 11.6 g/dL — ABNORMAL LOW (ref 12.0–15.0)
Potassium: 3.5 mmol/L (ref 3.5–5.1)
Sodium: 137 mmol/L (ref 135–145)
TCO2: 24 mmol/L (ref 22–32)

## 2021-06-20 LAB — GLUCOSE, CAPILLARY: Glucose-Capillary: 143 mg/dL — ABNORMAL HIGH (ref 70–99)

## 2021-06-20 LAB — URINALYSIS, ROUTINE W REFLEX MICROSCOPIC
Bilirubin Urine: NEGATIVE
Glucose, UA: NEGATIVE mg/dL
Hgb urine dipstick: NEGATIVE
Ketones, ur: NEGATIVE mg/dL
Leukocytes,Ua: NEGATIVE
Nitrite: NEGATIVE
Protein, ur: NEGATIVE mg/dL
Specific Gravity, Urine: <1.005 — ABNORMAL LOW (ref 1.005–1.030)
pH: 5.5 (ref 5.0–8.0)

## 2021-06-20 LAB — CBC
HCT: 35.8 % — ABNORMAL LOW (ref 36.0–46.0)
Hemoglobin: 11.7 g/dL — ABNORMAL LOW (ref 12.0–15.0)
MCH: 29.5 pg (ref 26.0–34.0)
MCHC: 32.7 g/dL (ref 30.0–36.0)
MCV: 90.2 fL (ref 80.0–100.0)
Platelets: 230 10*3/uL (ref 150–400)
RBC: 3.97 MIL/uL (ref 3.87–5.11)
RDW: 13.7 % (ref 11.5–15.5)
WBC: 9.6 10*3/uL (ref 4.0–10.5)
nRBC: 0 % (ref 0.0–0.2)

## 2021-06-20 LAB — TROPONIN I (HIGH SENSITIVITY): Troponin I (High Sensitivity): 4 ng/L (ref <18)

## 2021-06-20 LAB — PROTIME-INR
INR: 2.6 — ABNORMAL HIGH (ref 0.8–1.2)
Prothrombin Time: 27.6 seconds — ABNORMAL HIGH (ref 11.4–15.2)

## 2021-06-20 LAB — SAMPLE TO BLOOD BANK

## 2021-06-20 LAB — RESP PANEL BY RT-PCR (FLU A&B, COVID) ARPGX2
Influenza A by PCR: NEGATIVE
Influenza B by PCR: NEGATIVE
SARS Coronavirus 2 by RT PCR: NEGATIVE

## 2021-06-20 LAB — ETHANOL: Alcohol, Ethyl (B): <10 mg/dL (ref <10)

## 2021-06-20 LAB — CBG MONITORING, ED: Glucose-Capillary: 124 mg/dL — ABNORMAL HIGH (ref 70–99)

## 2021-06-20 LAB — BRAIN NATRIURETIC PEPTIDE: B Natriuretic Peptide: 151.3 pg/mL — ABNORMAL HIGH (ref 0.0–100.0)

## 2021-06-20 MED ORDER — VITAMIN D 25 MCG (1000 UNIT) PO TABS
1000.0000 [IU] | ORAL_TABLET | Freq: Every day | ORAL | Status: DC
  Administered 2021-06-20 – 2021-06-23 (×4): 1000 [IU] via ORAL
  Filled 2021-06-20 (×4): qty 1

## 2021-06-20 MED ORDER — SPIRONOLACTONE 25 MG PO TABS: 25.0000 mg | ORAL_TABLET | Freq: Every day | ORAL | Status: DC

## 2021-06-20 MED ORDER — ONDANSETRON HCL 4 MG PO TABS: 4.0000 mg | ORAL_TABLET | Freq: Four times a day (QID) | ORAL | Status: DC | PRN

## 2021-06-20 MED ORDER — ONDANSETRON HCL 4 MG/2ML IJ SOLN: 4.0000 mg | Freq: Four times a day (QID) | INTRAMUSCULAR | Status: DC | PRN

## 2021-06-20 MED ORDER — ACETAMINOPHEN 650 MG RE SUPP: 650.0000 mg | Freq: Four times a day (QID) | RECTAL | Status: DC | PRN

## 2021-06-20 MED ORDER — VITAMIN D 125 MCG (5000 UT) PO CAPS: 1.0000 | ORAL_CAPSULE | Freq: Every day | ORAL | Status: DC

## 2021-06-20 MED ORDER — SODIUM FLUORIDE 1.1 % DT CREA: 1.0000 "application " | TOPICAL_CREAM | DENTAL | Status: DC

## 2021-06-20 MED ORDER — POTASSIUM CHLORIDE CRYS ER 10 MEQ PO TBCR
10.0000 meq | EXTENDED_RELEASE_TABLET | Freq: Every day | ORAL | Status: DC
  Administered 2021-06-20: 10 meq via ORAL
  Filled 2021-06-20: qty 1

## 2021-06-20 MED ORDER — ACETAMINOPHEN 500 MG PO TABS
1000.0000 mg | ORAL_TABLET | Freq: Once | ORAL | Status: AC
  Administered 2021-06-20: 1000 mg via ORAL
  Filled 2021-06-20: qty 2

## 2021-06-20 MED ORDER — INSULIN ASPART 100 UNIT/ML IJ SOLN
0.0000 [IU] | Freq: Three times a day (TID) | INTRAMUSCULAR | Status: DC
  Administered 2021-06-20: 1 [IU] via SUBCUTANEOUS

## 2021-06-20 MED ORDER — METOPROLOL SUCCINATE ER 25 MG PO TB24
25.0000 mg | ORAL_TABLET | Freq: Every day | ORAL | Status: DC
  Administered 2021-06-21 – 2021-06-23 (×3): 25 mg via ORAL
  Filled 2021-06-20 (×3): qty 1

## 2021-06-20 MED ORDER — IOHEXOL 300 MG/ML  SOLN
75.0000 mL | Freq: Once | INTRAMUSCULAR | Status: AC | PRN
  Administered 2021-06-20: 75 mL via INTRAVENOUS

## 2021-06-20 MED ORDER — TORSEMIDE 20 MG PO TABS: 10.0000 mg | ORAL_TABLET | Freq: Every day | ORAL | Status: DC

## 2021-06-20 MED ORDER — POTASSIUM CHLORIDE CRYS ER 20 MEQ PO TBCR
40.0000 meq | EXTENDED_RELEASE_TABLET | Freq: Once | ORAL | Status: AC
  Administered 2021-06-20: 40 meq via ORAL
  Filled 2021-06-20: qty 2

## 2021-06-20 MED ORDER — LACTATED RINGERS IV BOLUS
1000.0000 mL | Freq: Once | INTRAVENOUS | Status: AC
  Administered 2021-06-20: 1000 mL via INTRAVENOUS

## 2021-06-20 MED ORDER — ONDANSETRON HCL 4 MG/2ML IJ SOLN
4.0000 mg | Freq: Once | INTRAMUSCULAR | Status: AC
  Administered 2021-06-20: 4 mg via INTRAVENOUS
  Filled 2021-06-20: qty 2

## 2021-06-20 MED ORDER — ACETAMINOPHEN 500 MG PO TABS
1000.0000 mg | ORAL_TABLET | Freq: Four times a day (QID) | ORAL | Status: DC | PRN
  Administered 2021-06-22: 1000 mg via ORAL
  Filled 2021-06-20: qty 2

## 2021-06-20 MED ORDER — ACETAMINOPHEN 325 MG PO TABS: 650.0000 mg | ORAL_TABLET | Freq: Four times a day (QID) | ORAL | Status: DC | PRN

## 2021-06-20 MED ORDER — SODIUM CHLORIDE 0.9% FLUSH
3.0000 mL | Freq: Two times a day (BID) | INTRAVENOUS | Status: DC
  Administered 2021-06-20 – 2021-06-23 (×6): 3 mL via INTRAVENOUS

## 2021-06-20 NOTE — ED Triage Notes (Signed)
LEVEL 2 FALL ON THINNERS.  Pt from home BIB EMS for syncopal episode x2. Pt fell and hit head. Pt on Xarelto. C/o left hip pain. Hematoma present on left hip.

## 2021-06-20 NOTE — ED Notes (Signed)
Attempted orthostatic VS and ambulation. Pt has had several readings of low BP and reports feeling dizzy. MD notified.

## 2021-06-20 NOTE — ED Provider Notes (Signed)
[facility] EMERGENCY DEPARTMENT Provider Note   CSN: [id number] Arrival date & time:        History Chief Complaint  Patient presents with   Yolanda Bell is a 80 y.o. female.   Fall Pertinent negatives include no chest pain, no abdominal pain, no headaches and no shortness of breath.   80 year old female with a history of atrial fibrillation on Xarelto, constrictive pericarditis, hypertension, SVT presenting to the emergency department after 2 falls and 1 syncopal episode at home.  The patient presented as a level 2 trauma due to fall while on Xarelto.  History is provided by the patient and EMS.  The patient states that she was ambulating to the restroom when she felt lightheaded, subsequently lost her balance.  She did strike her head but did not lose consciousness.  She landed on her left hip and had some pain and swelling with subsequent development of a hematoma.  She was ambulatory after that initial fall and ambulated back to her bed where she sat down, felt lightheaded and subsequently syncopized.  She woke on the floor and then called EMS.  She denies a headache or vision changes at this time.  She was transported to [facility] health and arrived GCS 15, ABC intact, complaining of pain in her left hip.  Past Medical History:  Diagnosis Date   Atrial fibrillation ([place])    Status post TEE cardioversion   Constrictive pericarditis    2 status post pericardial stripping at the [facility]   Gout    Hypertension    Spastic dysphonia    Supraventricular tachycardia ([place])    Status post RF ablation by Dr. Peterson Lombard    Patient Active Problem List   Diagnosis Date Noted   Near syncope 06/20/2021   Fall    Hypotension    Hematoma    AF (atrial fibrillation) ([place]) 08/20/2016   Atrial fibrillation ([place]) 05/13/2011   Constrictive pericarditis 05/13/2011   Supraventricular tachycardia ([place]) 05/13/2011   Hypertension 05/13/2011   Gout 05/13/2011     Past Surgical History:  Procedure Laterality Date   ABDOMINAL HYSTERECTOMY     1987   CARDIOVERSION  11/21/2012   Procedure: CARDIOVERSION;  Surgeon: Darlin Coco, MD;  Location: [place];  Service: Cardiovascular;;   CARDIOVERSION N/A 06/10/2014   Procedure: CARDIOVERSION;  Surgeon: Thayer Headings, MD;  Location: [place];  Service: Cardiovascular;  Laterality: N/A;   CARDIOVERSION N/A 10/14/2015   Procedure: CARDIOVERSION;  Surgeon: Josue Hector, MD;  Location: [facility] ENDOSCOPY;  Service: Cardiovascular;  Laterality: N/A;   CARDIOVERSION N/A 05/12/2016   Procedure: CARDIOVERSION;  Surgeon: Skeet Latch, MD;  Location: [place];  Service: Cardiovascular;  Laterality: N/A;   [place] N/A 08/20/2016   Procedure: Atrial Fibrillation Ablation;  Surgeon: [name], MD;  Location: [place] CV LAB;  Service: Cardiovascular;  Laterality: N/A;   EYE SURGERY     cataracts   PERICARDIECTOMY     TEE WITHOUT CARDIOVERSION N/A 06/10/2014   Procedure: TRANSESOPHAGEAL ECHOCARDIOGRAM (TEE);  Surgeon: Thayer Headings, MD;  Location: [facility] ENDOSCOPY;  Service: Cardiovascular;  Laterality: N/A;   TONSILLECTOMY       OB History   No obstetric history on file.     Family History  Problem Relation Age of Onset   Heart disease Mother    Emphysema Father    Breast cancer Sister  Social History   Tobacco Use   Smoking status: Never   Smokeless tobacco: Never  Vaping Use   Vaping Use: Never used  Substance Use Topics   Alcohol use: Yes    Comment: occasionally   Drug use: No    Home Medications Prior to Admission medications   Medication Sig Start Date End Date Taking? Authorizing Provider  acetaminophen (TYLENOL) 500 MG tablet Take 1,000 mg by mouth every 6 (six) hours as needed for mild pain.   Yes [provider]  Ascorbic Acid (VITAMIN C) 500 MG CAPS Take 1 tablet by mouth daily.   Yes [provider]  Cholecalciferol (VITAMIN D) 125 MCG (5000 UT) CAPS Take 1 tablet by mouth daily.   Yes [provider]  denosumab (PROLIA) 60 MG/ML SOSY injection Inject 60 mg into the skin every 6 (six) months. Last dose End of may early april   Yes [provider]  DENTA 5000 PLUS 1.1 % CREA dental cream Place 1 application onto teeth See admin instructions. 05/31/21  Yes [provider]  glucosamine-chondroitin 500-400 MG tablet Take 1 tablet by mouth daily.   Yes [provider]  indomethacin (INDOCIN) 25 MG capsule Take 25 mg by mouth as needed (gout). For gout   Yes [provider]  metoprolol succinate (TOPROL-XL) 25 MG 24 hr tablet TAKE 1 TABLET BY MOUTH TWICE A DAY 06/01/21  Yes Nahser, Wonda Cheng, MD  potassium chloride (KLOR-CON M10) 10 MEQ tablet Take 1 tablet (10 mEq total) by mouth daily. 12/15/20  Yes Nahser, Wonda Cheng, MD  spironolactone (ALDACTONE) 50 MG tablet Take 1 tablet (50 mg total) by mouth daily. 12/15/20  Yes Nahser, Wonda Cheng, MD  torsemide (DEMADEX) 20 MG tablet Take 1 tablet (20 mg total) by mouth daily. 12/15/20  Yes Nahser, Wonda Cheng, MD  XARELTO 20 MG TABS tablet TAKE 1 TABLET (20 MG TOTAL) BY MOUTH DAILY WITH SUPPER. 01/28/21  Yes Nahser, Wonda Cheng, MD  Ca Phosphate-Cholecalciferol (ALIVE CALCIUM PLUS VITAMIN D3) 260-25 MG-MCG CHEW Chew 1 tablet by mouth daily.    [provider]    Allergies    Allopurinol, Amiodarone, Corticosteroids, Other, and Erythromycin  Review of Systems   Review of Systems  Constitutional:  Negative for chills and fever.  HENT:  Negative for ear pain and sore throat.   Eyes:  Negative for pain and visual disturbance.  Respiratory:  Negative for cough and shortness of breath.   Cardiovascular:  Negative for chest pain and palpitations.  Gastrointestinal:  Negative for abdominal pain and vomiting.  Genitourinary:  Negative for dysuria and hematuria.  Musculoskeletal:  Positive for arthralgias.  Negative for back pain.  Skin:  Positive for color change. Negative for rash.  Neurological:  Positive for syncope. Negative for seizures and headaches.  All other systems reviewed and are negative.  Physical Exam Updated Vital Signs BP 106/65   Pulse 90   Temp (!) 97.5 F (36.4 C) (Oral)   Resp 20   Ht 5' 2.25" (1.581 m)   Wt 65 kg   SpO2 100%   BMI 26.00 kg/m   Physical Exam Vitals and nursing note reviewed.  Constitutional:      General: She is not in acute distress.    Appearance: She is well-developed.     Comments: GCS 15, ABC intact  HENT:     Head: Normocephalic and atraumatic.  Eyes:     Extraocular Movements: Extraocular movements intact.  Conjunctiva/sclera: Conjunctivae normal.     Pupils: Pupils are equal, round, and reactive to light.  Neck:     Comments: No midline tenderness to palpation of the cervical spine.  Range of motion intact Cardiovascular:     Rate and Rhythm: Normal rate and regular rhythm.     Heart sounds: No murmur heard. Pulmonary:     Effort: Pulmonary effort is normal. No respiratory distress.     Breath sounds: Normal breath sounds.  Chest:     Comments: Clavicles stable nontender to AP compression.  Chest wall stable and nontender to AP and lateral compression. Abdominal:     Palpations: Abdomen is soft.     Tenderness: There is no abdominal tenderness.  Musculoskeletal:     Cervical back: Neck supple.     Comments: No midline tenderness to palpation of the thoracic or lumbar spine.  Extremities atraumatic with intact range of motion.  Hematoma about the left hip with some tenderness to palpation.  No obvious hip deformity.  Left lower extremity neurovascularly intact  Skin:    General: Skin is warm and dry.  Neurological:     Mental Status: She is alert.     Comments: Cranial nerves II through XII grossly intact.  Moving all 4 extremities spontaneously.  Sensation grossly intact all 4 extremities    ED Results / Procedures /  Treatments   Labs (all labs ordered are listed, but only abnormal results are displayed) Labs Reviewed  COMPREHENSIVE METABOLIC PANEL - Abnormal; Notable for the following components:      Result Value   CO2 21 (*)    Glucose, Bld 194 (*)    Creatinine, Ser 1.14 (*)    Total Protein 6.3 (*)    GFR, Estimated 49 (*)    All other components within normal limits  CBC - Abnormal; Notable for the following components:   Hemoglobin 11.7 (*)    HCT 35.8 (*)    All other components within normal limits  PROTIME-INR - Abnormal; Notable for the following components:   Prothrombin Time 27.6 (*)    INR 2.6 (*)    All other components within normal limits  BRAIN NATRIURETIC PEPTIDE - Abnormal; Notable for the following components:   B Natriuretic Peptide 151.3 (*)    All other components within normal limits  I-STAT CHEM 8, ED - Abnormal; Notable for the following components:   Creatinine, Ser 1.10 (*)    Glucose, Bld 201 (*)    Calcium, Ion 1.08 (*)    Hemoglobin 11.6 (*)    HCT 34.0 (*)    All other components within normal limits  CBG MONITORING, ED - Abnormal; Notable for the following components:   Glucose-Capillary 124 (*)    All other components within normal limits  RESP PANEL BY RT-PCR (FLU A&B, COVID) ARPGX2  ETHANOL  URINALYSIS, ROUTINE W REFLEX MICROSCOPIC  HEMOGLOBIN [id number]  BASIC METABOLIC PANEL  SAMPLE TO BLOOD BANK  TROPONIN I (HIGH SENSITIVITY)    EKG None  Radiology CT HEAD WO CONTRAST  Result Date: 06/20/2021 CLINICAL DATA:  [place] this morning.  On anticoagulants. EXAM: CT HEAD WITHOUT CONTRAST CT CERVICAL SPINE WITHOUT CONTRAST TECHNIQUE: Multidetector CT imaging of the head and cervical spine was performed following the standard protocol without intravenous contrast. Multiplanar CT image reconstructions of the cervical spine were also generated. COMPARISON:  None. FINDINGS: CT HEAD FINDINGS Brain: Mildly enlarged ventricles and cortical sulci. No intracranial  hemorrhage, mass lesion or CT evidence of acute  infarction. Vascular: No hyperdense vessel or unexpected calcification. Skull: Normal. Negative for fracture or focal lesion. Sinuses/Orbits: Status post bilateral cataract extraction. Unremarkable bones and included paranasal sinuses. Other: None. CT CERVICAL SPINE FINDINGS Alignment: Mild reversal of the normal cervical lordosis. Mild anterolisthesis at the C3-4 and C6-7 levels. Skull base and vertebrae: No acute fracture. No primary bone lesion or focal pathologic process. Soft tissues and spinal canal: No prevertebral fluid or swelling. No visible canal hematoma. Disc levels:  Multilevel degenerative changes. Upper chest: Clear lung apices. Other: None. IMPRESSION: 1. No skull fracture or intracranial hemorrhage. 2. No cervical spine fracture or traumatic subluxation. 3. Mild diffuse cerebral and cerebellar atrophy. 4. Multilevel cervical and upper thoracic spine degenerative changes. Electronically Signed   By: Claudie Revering M.D.   On: 06/20/2021 11:36   CT CERVICAL SPINE WO CONTRAST  Result Date: 06/20/2021 CLINICAL DATA:  [place] this morning.  On anticoagulants. EXAM: CT HEAD WITHOUT CONTRAST CT CERVICAL SPINE WITHOUT CONTRAST TECHNIQUE: Multidetector CT imaging of the head and cervical spine was performed following the standard protocol without intravenous contrast. Multiplanar CT image reconstructions of the cervical spine were also generated. COMPARISON:  None. FINDINGS: CT HEAD FINDINGS Brain: Mildly enlarged ventricles and cortical sulci. No intracranial hemorrhage, mass lesion or CT evidence of acute infarction. Vascular: No hyperdense vessel or unexpected calcification. Skull: Normal. Negative for fracture or focal lesion. Sinuses/Orbits: Status post bilateral cataract extraction. Unremarkable bones and included paranasal sinuses. Other: None. CT CERVICAL SPINE FINDINGS Alignment: Mild reversal of the normal cervical lordosis. Mild anterolisthesis at the  C3-4 and C6-7 levels. Skull base and vertebrae: No acute fracture. No primary bone lesion or focal pathologic process. Soft tissues and spinal canal: No prevertebral fluid or swelling. No visible canal hematoma. Disc levels:  Multilevel degenerative changes. Upper chest: Clear lung apices. Other: None. IMPRESSION: 1. No skull fracture or intracranial hemorrhage. 2. No cervical spine fracture or traumatic subluxation. 3. Mild diffuse cerebral and cerebellar atrophy. 4. Multilevel cervical and upper thoracic spine degenerative changes. Electronically Signed   By: Claudie Revering M.D.   On: 06/20/2021 11:36   CT ABDOMEN PELVIS W CONTRAST  Result Date: 06/20/2021 CLINICAL DATA:  [place] this morning.  On anticoagulants. EXAM: CT ABDOMEN AND PELVIS WITH CONTRAST TECHNIQUE: Multidetector CT imaging of the abdomen and pelvis was performed using the standard protocol following bolus administration of intravenous contrast. CONTRAST:  4m OMNIPAQUE IOHEXOL 300 MG/ML  SOLN COMPARISON:  12/25/2004. FINDINGS: Lower chest: Interval 7 mm noncalcified nodule in the medial left lower lobe on image number 29/5. This measured 8 mm in maximum diameter previously in 2006. No new nodules. Atheromatous calcifications, including the coronary arteries and aorta. Hepatobiliary: Diffuse low density of the liver relative to the spleen. Cholecystectomy clips. Pancreas: Stable mildly dilated pancreatic duct with a maximum diameter of 3 mm. No pancreatic mass visualized. Spleen: Normal in size without focal abnormality. Adrenals/Urinary Tract: Normal appearing adrenal glands. Stable small left renal cysts, including an exophytic hemorrhagic or proteinaceous cyst. Small right kidney. Unremarkable ureters and urinary bladder. Stomach/Bowel: Large number of sigmoid and descending colon diverticula without evidence of diverticulitis. Normal appearing appendix. Unremarkable stomach and small bowel. Vascular/Lymphatic: Atheromatous arterial  calcifications without aneurysm. No enlarged lymph nodes. Reproductive: Status post hysterectomy. No adnexal masses. Other: Large hematoma extensive edema in the subcutaneous fat of the left buttocks. The hematoma measures 14.2 x 6.1 cm on image number 72/3. Musculoskeletal: No acute fractures, dislocations or subluxations seen. There is an approximately 80% compression  deformity of the L5 vertebra with 6 mm of retropulsion combining with bilateral facet and ligamentum flavum hypertrophy to produce marked canal stenosis at the L4-5 level with mild bilateral foraminal stenosis. Lumbar and lower thoracic spine degenerative changes. IMPRESSION: 1. Large hematoma and associated subcutaneous edema in the subcutaneous fat of the left buttocks. 2. No other acute abnormalities. 3. Diffuse hepatic steatosis. 4. No significant change in a 7 mm noncalcified nodule in the left lower lobe. The long-term stability is compatible with a benign process and this does not need follow-up. 5. Marked sigmoid and descending colon diverticulosis. 6. Old L5 vertebral compression fracture with retropulsion and marked canal stenosis at the L4-5 level. Electronically Signed   By: Claudie Revering M.D.   On: 06/20/2021 11:48   DG Pelvis Portable  Result Date: 06/20/2021 CLINICAL DATA:  Status post fall, the patient is on blood thinners. EXAM: PORTABLE PELVIS 1-2 VIEWS COMPARISON:  None. FINDINGS: There is no evidence of pelvic fracture or dislocation. Degenerative joint changes of the lower lumbar spine noted. IMPRESSION: No acute fracture or dislocation. Electronically Signed   By: Abelardo Diesel M.D.   On: 06/20/2021 10:57   DG Chest Port 1 View  Result Date: 06/20/2021 CLINICAL DATA:  Status post fall.  Patient is on blood thinners. EXAM: PORTABLE CHEST 1 VIEW COMPARISON:  August 09 2005 FINDINGS: The heart size and mediastinal contours are within normal limits. There is no focal infiltrate, pulmonary edema, or pleural effusion. Minus  scar/identified in both bases. The visualized skeletal structures are stable. IMPRESSION: No active disease. Electronically Signed   By: Abelardo Diesel M.D.   On: 06/20/2021 10:55    Procedures Procedures   Medications Ordered in ED Medications  acetaminophen (TYLENOL) tablet 1,000 mg (has no administration in time range)  spironolactone (ALDACTONE) tablet 25 mg (has no administration in time range)  torsemide (DEMADEX) tablet 10 mg (has no administration in time range)  metoprolol succinate (TOPROL-XL) 24 hr tablet 25 mg (has no administration in time range)  potassium chloride (KLOR-CON) CR tablet 10 mEq (has no administration in time range)  sodium chloride flush (NS) 0.9 % injection 3 mL (has no administration in time range)  acetaminophen (TYLENOL) tablet 650 mg (has no administration in time range)    Or  acetaminophen (TYLENOL) suppository 650 mg (has no administration in time range)  ondansetron (ZOFRAN) tablet 4 mg (has no administration in time range)    Or  ondansetron (ZOFRAN) injection 4 mg (has no administration in time range)  insulin aspart (novoLOG) injection 0-9 Units (has no administration in time range)  cholecalciferol (VITAMIN D3) tablet 1,000 Units (has no administration in time range)  potassium chloride SA (KLOR-CON) CR tablet 40 mEq (has no administration in time range)  acetaminophen (TYLENOL) tablet 1,000 mg (1,000 mg Oral Given 06/20/21 1141)  iohexol (OMNIPAQUE) 300 MG/ML solution 75 mL (75 mLs Intravenous Contrast Given 06/20/21 1119)  ondansetron (ZOFRAN) injection 4 mg (4 mg Intravenous Given 06/20/21 1252)  lactated ringers bolus 1,000 mL (1,000 mLs Intravenous New Bag/Given 06/20/21 1344)    ED Course  I have reviewed the triage vital signs and the nursing notes.  Pertinent labs & imaging results that were available during my care of the patient were reviewed by me and considered in my medical decision making (see chart for details).    MDM  Rules/Calculators/A&P  80 year old female with a history of atrial fibrillation on Xarelto, constrictive pericarditis, hypertension, SVT presenting to the emergency department after 2 falls and 1 syncopal episode at home.  The patient presented as a level 2 trauma due to fall while on Xarelto.  History is provided by the patient and EMS.  The patient states that she was ambulating to the restroom when she felt lightheaded, subsequently lost her balance.  She did strike her head but did not lose consciousness.  She landed on her left hip and had some pain and swelling with subsequent development of a hematoma.  She was ambulatory after that initial fall and ambulated back to her bed where she sat down, felt lightheaded and subsequently syncopized.  She woke on the floor and then called EMS.  She denies a headache or vision changes at this time.  She was transported to [facility] health and arrived GCS 15, ABC intact, complaining of pain in her left hip.  Trauma imaging work-up included chest x-ray and pelvis x-ray with no evidence of acute cardiac or pulmonary abnormality with no evidence of rib fracture, no evidence of pelvic fracture or hip dislocation or fracture.  CT of the head was without intracranial abnormality, no evidence of skull fracture.  CT of the cervical spine was without evidence of acute fracture or malalignment of the cervical spine.  CT of the abdomen pelvis was performed which revealed no evidence of acute abnormality within the abdomen or pelvis, evidence of  very large hematoma along the patient's right lateral thigh.  And associated subcutaneous edema in the subcutaneous fat of the left buttocks, old L5 vertebral compression fracture with retropulsion and marked canal stenosis at the L4-L5 level, no acute fracture.  The hematoma measured 14.2 x 6.1 cm.  We attempted to ambulate the patient in the emergency department, however she became significantly lightheaded on  attempts at ambulation.  She became subsequently hypotensive and was resuscitated with a 1 L IV fluid bolus.  Blood product resuscitation was not provided because there was no evidence of active extravasation on the patient's CT.  The patient CBC resulted significant for mild anemia with a hemoglobin of 11.7 down from 14.5 three years ago.  Given the patient's lightheadedness, hypotension, difficulty with ambulation in the setting of a fall on Eliquis with large left thigh/buttocks hematoma, hospitalist medicine was consulted for admission for observation.  Unable to fully conduct orthostatics given the patient's lightheadedness.  Concern for possible orthostatic hypotension resulting in the patient's near syncopal episodes and syncopal episodes.  Following administration of IV fluids, the patient reported improvement in her lightheadedness.  The patient was subsequently admitted for observation in stable condition.  Final Clinical Impression(s) / ED Diagnoses Final diagnoses:  Fall  Fall, initial encounter  Hypotension, unspecified hypotension type  Lightheadedness  Hematoma    Rx / DC Orders ED Discharge Orders     None        Regan Lemming, MD 06/20/21 [phone]

## 2021-06-20 NOTE — Progress Notes (Signed)
   06/20/21 1000  Clinical Encounter Type  Visited With Patient  Visit Type Initial  Referral From Nurse  Consult/Referral To Chaplain  Spiritual Encounters  Spiritual Needs Prayer;Emotional  Stress Factors  Patient Stress Factors Lack of caregivers;Loss of control  Family Stress Factors Loss of control;Lack of caregivers;Health changes  Chaplain visited with patient in ED & provided spiritual care & comfort. Provided counsel for Yolanda Bell as she expressed her fear and concern for herself. Prayed with her and she asked for me to look for her son in the Waiting Area.  Ended visit with a departing blessing. Went to ED Waiting Area and no relative or son present at this time.  Will remain available throughout day.    Rev. Mammie Lorenzo

## 2021-06-20 NOTE — ED Notes (Signed)
Patient transported to CT with Trauma RN

## 2021-06-20 NOTE — ED Notes (Signed)
Called service response for house tray.

## 2021-06-20 NOTE — ED Notes (Signed)
Trauma Response Nurse Note-  Reason for Call / Reason for Trauma activation:   - L2 fall on thinners  Initial Focused Assessment (If applicable, or please see trauma documentation):  - Pt A/Ox4  - Notable large hematoma and bruising to L hip - + DP pulses and + sensation and movement to L hip - VSS - 20G L AC  Interventions:  - Trauma labs - CXR and pelvis XRAY - CT pan scan w/ L hip  Plan of Care as of this note:  - Awaiting results  Event Summary:   - Pt fell in house and hit L head, L hip and R forearm.  Pt takes xarelto for a-fib.  Pt states she has had approximately 7 cardioversions for a-fib.  Pt currently in NSR. Pt's son sent to bedside while TRN took pt to CT.  [place], [place]

## 2021-06-20 NOTE — H&P (Signed)
History and Physical    Yolanda Bell [id number] DOB: 04-15-1941 DOA: 06/20/2021  PCP: Hulan Fess, MD (Confirm with patient/family/NH records and if not entered, this has to be entered at [facility] point of entry) Patient coming from: Home  I have personally briefly reviewed patient's old medical records in [place]  Chief Complaint: Feeling better  HPI: Yolanda Bell is a 80 y.o. female with medical history significant of PAF on Xarelto, paroxysmal SVT, liver scarring (cirrhosis?), came with syncope x2 this morning.  Patient reported occasional near syncopal episodes, all happens in the mornings while she was rising up from bed or standing up, but before today, she never lost her consciousness or fell. This morning, she was standing up in the bathroom then fell lightheaded and fell down on her left hip, she remembered probably lost her conscious for few seconds but no head injury, she tried to get up, but fell again. No weakness or numbness on any of her limbs.  She went to have a COVID booster on Wednesday, severe headache on Thursday, she took some Tylenol and headache resolved.  She denies any weakness after the posterior injection.  Yesterday she felt fine.  She has been monitoring her blood pressure frequently at home, at baseline her SBP in 120s, she was concerned about episodes of hypotension in the mornings, but not sure how low BP reading was.  She insisted that she should be on diuresis of torsemide and Aldactone for her liver problems, which she described scarring from prolonged steroid use in her childhood.  EMS arrived found patient BP in 80s, FS 264.  ED Course: BP borderline low, responded to IV bolus.  CT head negative for acute findings.  CT abdomen pelvis found large hematoma to left gluteal area.  Hb=13.8.  Patient reports feeling better after IV fluids.  Review of Systems: As per HPI otherwise 14 point review of systems negative.    Past Medical History:   Diagnosis Date   Atrial fibrillation ([place])    Status post TEE cardioversion   Constrictive pericarditis    2 status post pericardial stripping at the [facility]   Gout    Hypertension    Spastic dysphonia    Supraventricular tachycardia ([place])    Status post RF ablation by Dr. Peterson [place]    Past Surgical History:  Procedure Laterality Date   [place]  11/21/2012   Procedure: CARDIOVERSION;  Surgeon: Darlin Coco, MD;  Location: [place];  Service: Cardiovascular;;   CARDIOVERSION N/A 06/10/2014   Procedure: CARDIOVERSION;  Surgeon: Thayer Headings, MD;  Location: [place];  Service: Cardiovascular;  Laterality: N/A;   CARDIOVERSION N/A 10/14/2015   Procedure: CARDIOVERSION;  Surgeon: Josue Hector, MD;  Location: [facility] ENDOSCOPY;  Service: Cardiovascular;  Laterality: N/A;   CARDIOVERSION N/A 05/12/2016   Procedure: CARDIOVERSION;  Surgeon: Skeet Latch, MD;  Location: [facility] ENDOSCOPY;  Service: Cardiovascular;  Laterality: N/A;   [place] N/A 08/20/2016   Procedure: Atrial Fibrillation Ablation;  Surgeon: [name], MD;  Location: [place] CV LAB;  Service: Cardiovascular;  Laterality: N/A;   EYE SURGERY     cataracts   PERICARDIECTOMY     TEE WITHOUT CARDIOVERSION N/A 06/10/2014   Procedure: TRANSESOPHAGEAL ECHOCARDIOGRAM (TEE);  Surgeon: Thayer Headings, MD;  Location: [place];  Service: Cardiovascular;  Laterality: N/A;   TONSILLECTOMY  reports that she has never smoked. She has never used smokeless tobacco. She reports current alcohol use. She reports that she does not use drugs.  Allergies  Allergen Reactions   Allopurinol     More severe gout    Amiodarone Other (See Comments)    Dizziness, halucinations   Corticosteroids Other (See Comments)    Liver scarring as a child   Other Other (See Comments)    Cannot take STEROIDS-unknown reaction-caused by  internal scarring on liver.    Anesthesia precaution due to excessive liver scarring    Erythromycin Nausea And Vomiting and Rash    Family History  Problem Relation Age of Onset   Heart disease Mother    Emphysema Father    Breast cancer Sister      Prior to Admission medications   Medication Sig Start Date End Date Taking? Authorizing Provider  acetaminophen (TYLENOL) 500 MG tablet Take 1,000 mg by mouth every 6 (six) hours as needed for mild pain.   Yes [provider]  Ascorbic Acid (VITAMIN C) 500 MG CAPS Take 1 tablet by mouth daily.   Yes [provider]  Cholecalciferol (VITAMIN D) 125 MCG (5000 UT) CAPS Take 1 tablet by mouth daily.   Yes [provider]  denosumab (PROLIA) 60 MG/ML SOSY injection Inject 60 mg into the skin every 6 (six) months. Last dose End of may early april   Yes [provider]  DENTA 5000 PLUS 1.1 % CREA dental cream Place 1 application onto teeth See admin instructions. 05/31/21  Yes [provider]  glucosamine-chondroitin 500-400 MG tablet Take 1 tablet by mouth daily.   Yes [provider]  indomethacin (INDOCIN) 25 MG capsule Take 25 mg by mouth as needed (gout). For gout   Yes [provider]  metoprolol succinate (TOPROL-XL) 25 MG 24 hr tablet TAKE 1 TABLET BY MOUTH TWICE A DAY 06/01/21  Yes Nahser, Wonda Cheng, MD  potassium chloride (KLOR-CON M10) 10 MEQ tablet Take 1 tablet (10 mEq total) by mouth daily. 12/15/20  Yes Nahser, Wonda Cheng, MD  spironolactone (ALDACTONE) 50 MG tablet Take 1 tablet (50 mg total) by mouth daily. 12/15/20  Yes Nahser, Wonda Cheng, MD  torsemide (DEMADEX) 20 MG tablet Take 1 tablet (20 mg total) by mouth daily. 12/15/20  Yes Nahser, Wonda Cheng, MD  XARELTO 20 MG TABS tablet TAKE 1 TABLET (20 MG TOTAL) BY MOUTH DAILY WITH SUPPER. 01/28/21  Yes Nahser, Wonda Cheng, MD  Ca Phosphate-Cholecalciferol (ALIVE CALCIUM PLUS VITAMIN D3) 260-25 MG-MCG CHEW Chew 1 tablet by mouth daily.     [provider]    Physical Exam: Vitals:   06/20/21 1430 06/20/21 1445 06/20/21 1500 06/20/21 1515  BP: 113/67 105/70 (!) 104/59 106/65  Pulse: 90 (!) 103 87 90  Resp: '18 15 10 20  '$ Temp:      TempSrc:      SpO2: 99% 100% 100% 100%  Weight:      Height:        Constitutional: NAD, calm, comfortable Vitals:   06/20/21 1430 06/20/21 1445 06/20/21 1500 06/20/21 1515  BP: 113/67 105/70 (!) 104/59 106/65  Pulse: 90 (!) 103 87 90  Resp: '18 15 10 20  '$ Temp:      TempSrc:      SpO2: 99% 100% 100% 100%  Weight:      Height:       Eyes: PERRL, lids and conjunctivae normal ENMT: Mucous membranes are moist. Posterior pharynx clear  of any exudate or lesions.Normal dentition.  Neck: normal, supple, no masses, no thyromegaly Respiratory: clear to auscultation bilaterally, no wheezing, no crackles. Normal respiratory effort. No accessory muscle use.  Cardiovascular: Regular rate and rhythm, no murmurs / rubs / gallops. No extremity edema. 2+ pedal pulses. No carotid bruits.  Abdomen: no tenderness, no masses palpated. No hepatosplenomegaly. Bowel sounds positive.  Musculoskeletal: no clubbing / cyanosis. No joint deformity upper and lower extremities. Good ROM, no contractures. Normal muscle tone.  Skin: no rashes, lesions, ulcers. No induration Neurologic: CN 2-12 grossly intact. Sensation intact, DTR normal. Strength 5/5 in all 4.  Psychiatric: Normal judgment and insight. Alert and oriented x 3. Normal mood.     Labs on Admission: I have personally reviewed following labs and imaging studies  CBC: Recent Labs  Lab 06/20/21 1033 06/20/21 1040  WBC 9.6  --   HGB 11.7* 11.6*  HCT 35.8* 34.0*  MCV 90.2  --   PLT 230  --    Basic Metabolic Panel: Recent Labs  Lab 06/20/21 1033 06/20/21 1040  NA 138 137  K 3.5 3.5  CL 102 101  CO2 21*  --   GLUCOSE 194* 201*  BUN 16 14  CREATININE 1.14* 1.10*  CALCIUM 8.9  --    GFR: Estimated Creatinine Clearance: 37.5  mL/min (A) (by C-G formula based on SCr of 1.1 mg/dL (H)). Liver Function Tests: Recent Labs  Lab 06/20/21 1033  AST 23  ALT 15  ALKPHOS 47  BILITOT 1.1  PROT 6.3*  ALBUMIN 3.8   No results for input(s): LIPASE, AMYLASE in the last 168 hours. No results for input(s): AMMONIA in the last 168 hours. Coagulation Profile: Recent Labs  Lab 06/20/21 1033  INR 2.6*   Cardiac Enzymes: No results for input(s): CKTOTAL, CKMB, CKMBINDEX, TROPONINI in the last 168 hours. BNP (last 3 results) No results for input(s): PROBNP in the last 8760 hours. HbA1C: No results for input(s): HGBA1C in the last 72 hours. CBG: No results for input(s): GLUCAP in the last 168 hours. Lipid Profile: No results for input(s): CHOL, HDL, LDLCALC, TRIG, CHOLHDL, LDLDIRECT in the last 72 hours. Thyroid Function Tests: No results for input(s): TSH, T4TOTAL, FREET4, T3FREE, THYROIDAB in the last 72 hours. Anemia Panel: No results for input(s): VITAMINB12, FOLATE, FERRITIN, TIBC, IRON, RETICCTPCT in the last 72 hours. Urine analysis: No results found for: COLORURINE, APPEARANCEUR, LABSPEC, [place], GLUCOSEU, HGBUR, BILIRUBINUR, KETONESUR, PROTEINUR, UROBILINOGEN, NITRITE, LEUKOCYTESUR  Radiological Exams on Admission: CT HEAD WO CONTRAST  Result Date: 06/20/2021 CLINICAL DATA:  [place] this morning.  On anticoagulants. EXAM: CT HEAD WITHOUT CONTRAST CT CERVICAL SPINE WITHOUT CONTRAST TECHNIQUE: Multidetector CT imaging of the head and cervical spine was performed following the standard protocol without intravenous contrast. Multiplanar CT image reconstructions of the cervical spine were also generated. COMPARISON:  None. FINDINGS: CT HEAD FINDINGS Brain: Mildly enlarged ventricles and cortical sulci. No intracranial hemorrhage, mass lesion or CT evidence of acute infarction. Vascular: No hyperdense vessel or unexpected calcification. Skull: Normal. Negative for fracture or focal lesion. Sinuses/Orbits: Status post  bilateral cataract extraction. Unremarkable bones and included paranasal sinuses. Other: None. CT CERVICAL SPINE FINDINGS Alignment: Mild reversal of the normal cervical lordosis. Mild anterolisthesis at the C3-4 and C6-7 levels. Skull base and vertebrae: No acute fracture. No primary bone lesion or focal pathologic process. Soft tissues and spinal canal: No prevertebral fluid or swelling. No visible canal hematoma. Disc levels:  Multilevel degenerative changes. Upper chest: Clear lung apices. Other:  None. IMPRESSION: 1. No skull fracture or intracranial hemorrhage. 2. No cervical spine fracture or traumatic subluxation. 3. Mild diffuse cerebral and cerebellar atrophy. 4. Multilevel cervical and upper thoracic spine degenerative changes. Electronically Signed   By: Claudie Revering M.D.   On: 06/20/2021 11:36   CT CERVICAL SPINE WO CONTRAST  Result Date: 06/20/2021 CLINICAL DATA:  [place] this morning.  On anticoagulants. EXAM: CT HEAD WITHOUT CONTRAST CT CERVICAL SPINE WITHOUT CONTRAST TECHNIQUE: Multidetector CT imaging of the head and cervical spine was performed following the standard protocol without intravenous contrast. Multiplanar CT image reconstructions of the cervical spine were also generated. COMPARISON:  None. FINDINGS: CT HEAD FINDINGS Brain: Mildly enlarged ventricles and cortical sulci. No intracranial hemorrhage, mass lesion or CT evidence of acute infarction. Vascular: No hyperdense vessel or unexpected calcification. Skull: Normal. Negative for fracture or focal lesion. Sinuses/Orbits: Status post bilateral cataract extraction. Unremarkable bones and included paranasal sinuses. Other: None. CT CERVICAL SPINE FINDINGS Alignment: Mild reversal of the normal cervical lordosis. Mild anterolisthesis at the C3-4 and C6-7 levels. Skull base and vertebrae: No acute fracture. No primary bone lesion or focal pathologic process. Soft tissues and spinal canal: No prevertebral fluid or swelling. No visible canal  hematoma. Disc levels:  Multilevel degenerative changes. Upper chest: Clear lung apices. Other: None. IMPRESSION: 1. No skull fracture or intracranial hemorrhage. 2. No cervical spine fracture or traumatic subluxation. 3. Mild diffuse cerebral and cerebellar atrophy. 4. Multilevel cervical and upper thoracic spine degenerative changes. Electronically Signed   By: Claudie Revering M.D.   On: 06/20/2021 11:36   CT ABDOMEN PELVIS W CONTRAST  Result Date: 06/20/2021 CLINICAL DATA:  [place] this morning.  On anticoagulants. EXAM: CT ABDOMEN AND PELVIS WITH CONTRAST TECHNIQUE: Multidetector CT imaging of the abdomen and pelvis was performed using the standard protocol following bolus administration of intravenous contrast. CONTRAST:  80m OMNIPAQUE IOHEXOL 300 MG/ML  SOLN COMPARISON:  12/25/2004. FINDINGS: Lower chest: Interval 7 mm noncalcified nodule in the medial left lower lobe on image number 29/5. This measured 8 mm in maximum diameter previously in 2006. No new nodules. Atheromatous calcifications, including the coronary arteries and aorta. Hepatobiliary: Diffuse low density of the liver relative to the spleen. Cholecystectomy clips. Pancreas: Stable mildly dilated pancreatic duct with a maximum diameter of 3 mm. No pancreatic mass visualized. Spleen: Normal in size without focal abnormality. Adrenals/Urinary Tract: Normal appearing adrenal glands. Stable small left renal cysts, including an exophytic hemorrhagic or proteinaceous cyst. Small right kidney. Unremarkable ureters and urinary bladder. Stomach/Bowel: Large number of sigmoid and descending colon diverticula without evidence of diverticulitis. Normal appearing appendix. Unremarkable stomach and small bowel. Vascular/Lymphatic: Atheromatous arterial calcifications without aneurysm. No enlarged lymph nodes. Reproductive: Status post hysterectomy. No adnexal masses. Other: Large hematoma extensive edema in the subcutaneous fat of the left buttocks. The hematoma  measures 14.2 x 6.1 cm on image number 72/3. Musculoskeletal: No acute fractures, dislocations or subluxations seen. There is an approximately 80% compression deformity of the L5 vertebra with 6 mm of retropulsion combining with bilateral facet and ligamentum flavum hypertrophy to produce marked canal stenosis at the L4-5 level with mild bilateral foraminal stenosis. Lumbar and lower thoracic spine degenerative changes. IMPRESSION: 1. Large hematoma and associated subcutaneous edema in the subcutaneous fat of the left buttocks. 2. No other acute abnormalities. 3. Diffuse hepatic steatosis. 4. No significant change in a 7 mm noncalcified nodule in the left lower lobe. The long-term stability is compatible with a benign process and this does  not need follow-up. 5. Marked sigmoid and descending colon diverticulosis. 6. Old L5 vertebral compression fracture with retropulsion and marked canal stenosis at the L4-5 level. Electronically Signed   By: Claudie Revering M.D.   On: 06/20/2021 11:48   DG Pelvis Portable  Result Date: 06/20/2021 CLINICAL DATA:  Status post fall, the patient is on blood thinners. EXAM: PORTABLE PELVIS 1-2 VIEWS COMPARISON:  None. FINDINGS: There is no evidence of pelvic fracture or dislocation. Degenerative joint changes of the lower lumbar spine noted. IMPRESSION: No acute fracture or dislocation. Electronically Signed   By: Abelardo Diesel M.D.   On: 06/20/2021 10:57   DG Chest Port 1 View  Result Date: 06/20/2021 CLINICAL DATA:  Status post fall.  Patient is on blood thinners. EXAM: PORTABLE CHEST 1 VIEW COMPARISON:  August 09 2005 FINDINGS: The heart size and mediastinal contours are within normal limits. There is no focal infiltrate, pulmonary edema, or pleural effusion. Minus scar/identified in both bases. The visualized skeletal structures are stable. IMPRESSION: No active disease. Electronically Signed   By: Abelardo Diesel M.D.   On: 06/20/2021 10:55    EKG: Independently reviewed.  Sinus, no PR or QTC issue.  Assessment/Plan Active Problems:   Near syncope  (please populate well all problems here in Problem List. (For example, if patient is on BP meds at home and you resume or decide to hold them, it is a problem that needs to be her. Same for CAD, COPD, HLD and so on)  Syncope -Secondary to hypotension, likely iatrogenic from BP/diuresis. -This is by some earlier near syncope episodes, I propose to hold her BP meds today. Patient however, insisted that her diuresis should be continued, "I do not want my liver problem get worse", I plan to cut down her Torsemide and Aldactone. -Orthostatic BP BID -Outpatient Echo.  Large gluteal hematoma -Hold anticoagulation for today -Repeat H&H tomorrow, if stable, will restart Xarelto.  Patient agreed with the plan  Elevated glucose -No previous diagnosis of diabetes, check A1c tomorrow, may need  go home with diabetes medications.  Call  Chronic dysphonia -At her baseline.  PAF -Continue metoprolol, hold Xarelto for today   DVT prophylaxis: SCD Code Status: Full Code Family Communication: None at bedside Disposition Plan: Expect less than 2 midnight hospital stay Consults called: None Admission status: Telemetry observation   Lequita Halt MD [facility] Pager [phone]  06/20/2021, 3:24 PM

## 2021-06-20 NOTE — ED Notes (Signed)
Attempted to call report. RN states they need to look at patient chart. Will call back in 62mnutes.

## 2021-06-20 NOTE — ED Notes (Signed)
Attempted to call report. Phone rang for 2 minutes with no answer.

## 2021-06-20 NOTE — Progress Notes (Signed)
Orthopedic Tech Progress Note Patient Details:  Yolanda Bell October 23, 1940 [id number] Level 2 Trauma  Patient ID: Yolanda Bell, female   DOB: [birth year], 80 y.o.   MRN: [id number]  Jearld Lesch 06/20/2021, 10:22 AM

## 2021-06-21 DIAGNOSIS — R490 Dysphonia: Secondary | ICD-10-CM | POA: Diagnosis present

## 2021-06-21 DIAGNOSIS — I2510 Atherosclerotic heart disease of native coronary artery without angina pectoris: Secondary | ICD-10-CM | POA: Diagnosis present

## 2021-06-21 DIAGNOSIS — I10 Essential (primary) hypertension: Secondary | ICD-10-CM | POA: Diagnosis present

## 2021-06-21 DIAGNOSIS — E785 Hyperlipidemia, unspecified: Secondary | ICD-10-CM | POA: Diagnosis present

## 2021-06-21 DIAGNOSIS — D6832 Hemorrhagic disorder due to extrinsic circulating anticoagulants: Secondary | ICD-10-CM | POA: Diagnosis present

## 2021-06-21 DIAGNOSIS — Z20822 Contact with and (suspected) exposure to covid-19: Secondary | ICD-10-CM | POA: Diagnosis present

## 2021-06-21 DIAGNOSIS — D62 Acute posthemorrhagic anemia: Secondary | ICD-10-CM

## 2021-06-21 DIAGNOSIS — Z8249 Family history of ischemic heart disease and other diseases of the circulatory system: Secondary | ICD-10-CM | POA: Diagnosis not present

## 2021-06-21 DIAGNOSIS — W19XXXA Unspecified fall, initial encounter: Secondary | ICD-10-CM | POA: Diagnosis present

## 2021-06-21 DIAGNOSIS — M109 Gout, unspecified: Secondary | ICD-10-CM | POA: Diagnosis present

## 2021-06-21 DIAGNOSIS — I311 Chronic constrictive pericarditis: Secondary | ICD-10-CM | POA: Diagnosis present

## 2021-06-21 DIAGNOSIS — E876 Hypokalemia: Secondary | ICD-10-CM | POA: Diagnosis present

## 2021-06-21 DIAGNOSIS — I951 Orthostatic hypotension: Secondary | ICD-10-CM | POA: Diagnosis present

## 2021-06-21 DIAGNOSIS — I480 Paroxysmal atrial fibrillation: Secondary | ICD-10-CM | POA: Diagnosis present

## 2021-06-21 DIAGNOSIS — I959 Hypotension, unspecified: Secondary | ICD-10-CM | POA: Diagnosis not present

## 2021-06-21 DIAGNOSIS — R55 Syncope and collapse: Secondary | ICD-10-CM | POA: Diagnosis present

## 2021-06-21 DIAGNOSIS — K7460 Unspecified cirrhosis of liver: Secondary | ICD-10-CM | POA: Diagnosis present

## 2021-06-21 DIAGNOSIS — S300XXA Contusion of lower back and pelvis, initial encounter: Secondary | ICD-10-CM | POA: Diagnosis present

## 2021-06-21 DIAGNOSIS — Z7901 Long term (current) use of anticoagulants: Secondary | ICD-10-CM | POA: Diagnosis not present

## 2021-06-21 DIAGNOSIS — I471 Supraventricular tachycardia: Secondary | ICD-10-CM | POA: Diagnosis present

## 2021-06-21 DIAGNOSIS — Z79899 Other long term (current) drug therapy: Secondary | ICD-10-CM | POA: Diagnosis not present

## 2021-06-21 DIAGNOSIS — J449 Chronic obstructive pulmonary disease, unspecified: Secondary | ICD-10-CM | POA: Diagnosis present

## 2021-06-21 DIAGNOSIS — S7002XA Contusion of left hip, initial encounter: Secondary | ICD-10-CM | POA: Diagnosis present

## 2021-06-21 DIAGNOSIS — E1165 Type 2 diabetes mellitus with hyperglycemia: Secondary | ICD-10-CM | POA: Diagnosis present

## 2021-06-21 DIAGNOSIS — I4892 Unspecified atrial flutter: Secondary | ICD-10-CM | POA: Diagnosis present

## 2021-06-21 DIAGNOSIS — Z803 Family history of malignant neoplasm of breast: Secondary | ICD-10-CM | POA: Diagnosis not present

## 2021-06-21 DIAGNOSIS — Z825 Family history of asthma and other chronic lower respiratory diseases: Secondary | ICD-10-CM | POA: Diagnosis not present

## 2021-06-21 LAB — BASIC METABOLIC PANEL
Anion gap: 7 (ref 5–15)
Anion gap: 8 (ref 5–15)
BUN: 15 mg/dL (ref 8–23)
BUN: 14 mg/dL (ref 8–23)
CO2: 26 mmol/L (ref 22–32)
CO2: 25 mmol/L (ref 22–32)
Calcium: 8.6 mg/dL — ABNORMAL LOW (ref 8.9–10.3)
Calcium: 8.6 mg/dL — ABNORMAL LOW (ref 8.9–10.3)
Chloride: 101 mmol/L (ref 98–111)
Chloride: 100 mmol/L (ref 98–111)
Creatinine, Ser: 1.09 mg/dL — ABNORMAL HIGH (ref 0.44–1.00)
Creatinine, Ser: 1 mg/dL (ref 0.44–1.00)
GFR, Estimated: 52 mL/min — ABNORMAL LOW (ref >60)
GFR, Estimated: 58 mL/min — ABNORMAL LOW (ref >60)
Glucose, Bld: 122 mg/dL — ABNORMAL HIGH (ref 70–99)
Glucose, Bld: 143 mg/dL — ABNORMAL HIGH (ref 70–99)
Potassium: 5.2 mmol/L — ABNORMAL HIGH (ref 3.5–5.1)
Potassium: 4.2 mmol/L (ref 3.5–5.1)
Sodium: 134 mmol/L — ABNORMAL LOW (ref 135–145)
Sodium: 133 mmol/L — ABNORMAL LOW (ref 135–145)

## 2021-06-21 LAB — GLUCOSE, CAPILLARY
Glucose-Capillary: 123 mg/dL — ABNORMAL HIGH (ref 70–99)
Glucose-Capillary: 121 mg/dL — ABNORMAL HIGH (ref 70–99)
Glucose-Capillary: 115 mg/dL — ABNORMAL HIGH (ref 70–99)

## 2021-06-21 LAB — CBC
HCT: 22.6 % — ABNORMAL LOW (ref 36.0–46.0)
HCT: 21.3 % — ABNORMAL LOW (ref 36.0–46.0)
Hemoglobin: 7.7 g/dL — ABNORMAL LOW (ref 12.0–15.0)
Hemoglobin: 7.4 g/dL — ABNORMAL LOW (ref 12.0–15.0)
MCH: 29.7 pg (ref 26.0–34.0)
MCH: 30 pg (ref 26.0–34.0)
MCHC: 34.1 g/dL (ref 30.0–36.0)
MCHC: 34.7 g/dL (ref 30.0–36.0)
MCV: 87.3 fL (ref 80.0–100.0)
MCV: 86.2 fL (ref 80.0–100.0)
Platelets: 217 10*3/uL (ref 150–400)
Platelets: 212 10*3/uL (ref 150–400)
RBC: 2.59 MIL/uL — ABNORMAL LOW (ref 3.87–5.11)
RBC: 2.47 MIL/uL — ABNORMAL LOW (ref 3.87–5.11)
RDW: 13.8 % (ref 11.5–15.5)
RDW: 13.8 % (ref 11.5–15.5)
WBC: 10.8 10*3/uL — ABNORMAL HIGH (ref 4.0–10.5)
WBC: 9.2 10*3/uL (ref 4.0–10.5)
nRBC: 0 % (ref 0.0–0.2)
nRBC: 0 % (ref 0.0–0.2)

## 2021-06-21 LAB — HEMOGLOBIN A1C
Hgb A1c MFr Bld: 4.6 % — ABNORMAL LOW (ref 4.8–5.6)
Mean Plasma Glucose: 85.32 mg/dL

## 2021-06-21 LAB — PREPARE RBC (CROSSMATCH)

## 2021-06-21 LAB — ABO/RH: ABO/RH(D): A POS

## 2021-06-21 MED ORDER — SODIUM CHLORIDE 0.9 % IV SOLN: INTRAVENOUS | Status: DC

## 2021-06-21 MED ORDER — SODIUM CHLORIDE 0.9% IV SOLUTION: Freq: Once | INTRAVENOUS | Status: DC

## 2021-06-21 MED ORDER — DIPHENHYDRAMINE HCL 25 MG PO CAPS
50.0000 mg | ORAL_CAPSULE | Freq: Every evening | ORAL | Status: DC | PRN
  Filled 2021-06-21: qty 2

## 2021-06-21 MED ORDER — MELATONIN 3 MG PO TABS
3.0000 mg | ORAL_TABLET | Freq: Every day | ORAL | Status: DC
  Administered 2021-06-21 – 2021-06-22 (×2): 3 mg via ORAL
  Filled 2021-06-21 (×2): qty 1

## 2021-06-21 MED ORDER — POTASSIUM CHLORIDE CRYS ER 10 MEQ PO TBCR
10.0000 meq | EXTENDED_RELEASE_TABLET | Freq: Every day | ORAL | Status: DC
  Administered 2021-06-21: 10 meq via ORAL
  Filled 2021-06-21: qty 1

## 2021-06-21 MED ORDER — SPIRONOLACTONE 25 MG PO TABS
25.0000 mg | ORAL_TABLET | Freq: Every day | ORAL | Status: DC
  Administered 2021-06-21: 25 mg via ORAL
  Filled 2021-06-21: qty 1

## 2021-06-21 NOTE — Consult Note (Signed)
Cardiology Consultation:   Patient ID: Yolanda Bell MRN: [id number]; DOB: [birth year]  Admit date: 06/20/2021 Date of Consult: 06/21/2021  PCP:  Hulan Fess, MD   [facility] Providers Cardiologist:  Mertie Moores, MD        Patient Profile:   Yolanda Bell is a 80 y.o. female with a hx of constrictive pericarditis, atrial fibrillation/flutter status post ablation, SVT, and hypertension who is being seen 06/21/2021 for the evaluation of syncope at the request of Dr. Tyrell Antonio.  History of Present Illness:   [name] had her COVID-19 booster on Wednesday.  Prior to that she was in her USOH.  The following two days she had arm soreness and head congestion.  She was otherwise well.  On Friday night/Saturday AM she got up from bed at 1am and was fine.  She got up again at 4am and had a syncopal episode.  There was no preceding chest pain, shortness of breath or palpitations.  She laid on the floor for 4 hours until her family member could come and open the storm door so the EMS could arrive.  She reported to other syncopal episodes before her family arrived.  Every time she tried to get up from the floor she vomited and passed out.  She noted that she has been eating and drinking normally.  She has had right-sided musculoskeletal pain since the fall but has not generally had any chest pain.  She is not had any lower extremity edema, orthopnea, or PND.  On arrival she was noted to have a very large left hip hematoma.  Hemoglobin was initially 11 but dropped to 7 this morning.  She was given IV fluids and her home diuretics have been held but she continues to have orthostasis so cardiology was consulted.  [name] has a history of constrictive pericarditis.  She underwent pericardial stripping at the [facility] many years ago.  She has had 2 ablations for atrial fibrillation/flutter.  The most recent was in 2017.  She last saw Dr. Acie Fredrickson 12/2020 and was doing well.    Past Medical History:   Diagnosis Date   Atrial fibrillation ([place])    Status post TEE cardioversion   Constrictive pericarditis    2 status post pericardial stripping at the [facility]   Gout    Hypertension    Spastic dysphonia    Supraventricular tachycardia ([place])    Status post RF ablation by Dr. Peterson Lombard    Past Surgical History:  Procedure Laterality Date   [place]  11/21/2012   Procedure: CARDIOVERSION;  Surgeon: Darlin Coco, MD;  Location: [place];  Service: Cardiovascular;;   CARDIOVERSION N/A 06/10/2014   Procedure: CARDIOVERSION;  Surgeon: Thayer Headings, MD;  Location: [place];  Service: Cardiovascular;  Laterality: N/A;   CARDIOVERSION N/A 10/14/2015   Procedure: CARDIOVERSION;  Surgeon: Josue Hector, MD;  Location: [facility] ENDOSCOPY;  Service: Cardiovascular;  Laterality: N/A;   CARDIOVERSION N/A 05/12/2016   Procedure: CARDIOVERSION;  Surgeon: Skeet Latch, MD;  Location: [facility] ENDOSCOPY;  Service: Cardiovascular;  Laterality: N/A;   [place] N/A 08/20/2016   Procedure: Atrial Fibrillation Ablation;  Surgeon: [name], MD;  Location: [place] CV LAB;  Service: Cardiovascular;  Laterality: N/A;   EYE SURGERY     cataracts   PERICARDIECTOMY     TEE WITHOUT CARDIOVERSION N/A 06/10/2014   Procedure: TRANSESOPHAGEAL ECHOCARDIOGRAM (  TEE);  Surgeon: Thayer Headings, MD;  Location: [place];  Service: Cardiovascular;  Laterality: N/A;   TONSILLECTOMY       Home Medications:  Prior to Admission medications   Medication Sig Start Date End Date Taking? Authorizing Provider  acetaminophen (TYLENOL) 500 MG tablet Take 1,000 mg by mouth every 6 (six) hours as needed for mild pain.   Yes [provider]  Ascorbic Acid (VITAMIN C) 500 MG CAPS Take 1 tablet by mouth daily.   Yes [provider]  Cholecalciferol (VITAMIN D) 125 MCG (5000 UT) CAPS Take 1 tablet by mouth daily.    Yes [provider]  denosumab (PROLIA) 60 MG/ML SOSY injection Inject 60 mg into the skin every 6 (six) months. Last dose End of may early april   Yes [provider]  DENTA 5000 PLUS 1.1 % CREA dental cream Place 1 application onto teeth See admin instructions. 05/31/21  Yes [provider]  glucosamine-chondroitin 500-400 MG tablet Take 1 tablet by mouth daily.   Yes [provider]  indomethacin (INDOCIN) 25 MG capsule Take 25 mg by mouth as needed (gout). For gout   Yes [provider]  metoprolol succinate (TOPROL-XL) 25 MG 24 hr tablet TAKE 1 TABLET BY MOUTH TWICE A DAY 06/01/21  Yes Nahser, Wonda Cheng, MD  potassium chloride (KLOR-CON M10) 10 MEQ tablet Take 1 tablet (10 mEq total) by mouth daily. 12/15/20  Yes Nahser, Wonda Cheng, MD  spironolactone (ALDACTONE) 50 MG tablet Take 1 tablet (50 mg total) by mouth daily. 12/15/20  Yes Nahser, Wonda Cheng, MD  torsemide (DEMADEX) 20 MG tablet Take 1 tablet (20 mg total) by mouth daily. 12/15/20  Yes Nahser, Wonda Cheng, MD  XARELTO 20 MG TABS tablet TAKE 1 TABLET (20 MG TOTAL) BY MOUTH DAILY WITH SUPPER. 01/28/21  Yes Nahser, Wonda Cheng, MD  Ca Phosphate-Cholecalciferol (ALIVE CALCIUM PLUS VITAMIN D3) 260-25 MG-MCG CHEW Chew 1 tablet by mouth daily.    [provider]    Inpatient Medications: Scheduled Meds:  sodium chloride   Intravenous Once   sodium chloride   Intravenous Once   cholecalciferol  1,000 Units Oral Daily   insulin aspart  0-9 Units Subcutaneous TID WC   metoprolol succinate  25 mg Oral Daily   potassium chloride  10 mEq Oral Daily   sodium chloride flush  3 mL Intravenous Q12H   spironolactone  25 mg Oral Daily   Continuous Infusions:  sodium chloride 75 mL/hr at 06/21/21 1313   PRN Meds: acetaminophen **OR** acetaminophen, acetaminophen, ondansetron **OR** ondansetron (ZOFRAN) IV  Allergies:    Allergies  Allergen Reactions   Allopurinol     More severe gout    Amiodarone  Other (See Comments)    Dizziness, halucinations   Corticosteroids Other (See Comments)    Liver scarring as a child   Other Other (See Comments)    Cannot take STEROIDS-unknown reaction-caused by internal scarring on liver.    Anesthesia precaution due to excessive liver scarring    Erythromycin Nausea And Vomiting and Rash    Social History:   Social History   Socioeconomic History   Marital status: Divorced    Spouse name: Not on file   Number of children: Not on file   Years of education: Not on file   Highest education level: Not on file  Occupational History   Not on file  Tobacco Use   Smoking status: Never   Smokeless tobacco: Never  Vaping  Use   Vaping Use: Never used  Substance and Sexual Activity   Alcohol use: Yes    Comment: occasionally   Drug use: No   Sexual activity: Not on file  Other Topics Concern   Not on file  Social History Narrative   Not on file   Social Determinants of Health   Financial Resource Strain: Not on file  Food Insecurity: Not on file  Transportation Needs: Not on file  Physical Activity: Not on file  Stress: Not on file  Social Connections: Not on file  Intimate Partner Violence: Not on file    Family History:   7 Family History  Problem Relation Age of Onset   Heart disease Mother    Emphysema Father    Breast cancer Sister      ROS:  Please see the history of present illness.  All other ROS reviewed and negative.     Physical Exam/Data:   Vitals:   06/21/21 0754 06/21/21 0813 06/21/21 0900 06/21/21 1229  BP: (!) 88/47 120/63  (P) 118/73  Pulse: (!) 118 (!) 102  (!) (P) 111  Resp: (!) 25 12  (P) 20  Temp:   98.4 F (36.9 C) (P) 99.4 F (37.4 C)  TempSrc:   Oral (P) Oral  SpO2: 100% 99%    Weight:      Height:        Intake/Output Summary (Last 24 hours) at 06/21/2021 1438 Last data filed at 06/21/2021 0900 Gross per 24 hour  Intake 1480 ml  Output --  Net 1480 ml   Last 3 Weights 06/21/2021  06/20/2021 12/15/2020  Weight (lbs) 151 lb 0.2 oz 143 lb 4.8 oz 150 lb 12.8 oz  Weight (kg) 68.5 kg 65 kg 68.402 kg     VS:  BP (P) 118/73 (BP Location: Right Arm)   Pulse (!) (P) 111   Temp (P) 99.4 F (37.4 C) (Oral)   Resp (P) 20   Ht 5' 2.25" (1.581 m)   Wt 68.5 kg   SpO2 99%   BMI 27.40 kg/m  , BMI Body mass index is 27.4 kg/m. GENERAL:  Well appearing HEENT: Pupils equal round and reactive, fundi not visualized, oral mucosa unremarkable NECK:  No jugular venous distention, waveform within normal limits, carotid upstroke brisk and symmetric, no bruits LUNGS:  Clear to auscultation bilaterally HEART:  Tachycardic.  Regular rhythm.  PMI not displaced or sustained,S1 and S2 within normal limits, no S3, no S4, no clicks, no rubs, no murmurs ABD:  Flat, positive bowel sounds normal in frequency in pitch, no bruits, no rebound, no guarding, no midline pulsatile mass, no hepatomegaly, no splenomegaly EXT:  2 plus pulses throughout, no edema, no cyanosis no clubbing SKIN:  No rashes no nodules NEURO:  Cranial nerves II through XII grossly intact, motor grossly intact throughout PSYCH:  Cognitively intact, oriented to person place and time  EKG:  The EKG was personally reviewed and demonstrates:  Sinus rhythm.  Rate 87 bpm.  Telemetry:  Telemetry was personally reviewed and demonstrates:  Sinus tachycardia  Relevant CV Studies:  Echo 05/2016:Study Conclusions   - Left ventricle: The cavity size was normal. Wall thickness was    normal. Systolic function was normal. The estimated ejection    fraction was in the range of 50% to 55%. Wall motion was normal;    there were no regional wall motion abnormalities. Doppler    parameters are consistent with abnormal left ventricular    relaxation (  grade 1 diastolic dysfunction).  - Mitral valve: Calcified annulus. There was mild regurgitation.  - Left atrium: The atrium was mildly dilated.   Impressions:   - Low normal LV systolic function;  grade 1 diastolic dysfunction;    mild MR; mild LAE.   Laboratory Data:  High Sensitivity Troponin:   Recent Labs  Lab 06/20/21 1033  TROPONINIHS 4     Chemistry Recent Labs  Lab 06/20/21 1033 06/20/21 1040 06/21/21 0234 06/21/21 0941  NA 138 137 134* 133*  K 3.5 3.5 5.2* 4.2  CL 102 101 101 100  CO2 21*  --  26 25  GLUCOSE 194* 201* 122* 143*  BUN '16 14 15 14  '$ CREATININE 1.14* 1.10* 1.09* 1.00  CALCIUM 8.9  --  8.6* 8.6*  GFRNONAA 49*  --  52* 58*  ANIONGAP 15  --  7 8    Recent Labs  Lab 06/20/21 1033  PROT 6.3*  ALBUMIN 3.8  AST 23  ALT 15  ALKPHOS 47  BILITOT 1.1   Hematology Recent Labs  Lab 06/20/21 1033 06/20/21 1040 06/21/21 0941 06/21/21 1217  WBC 9.6  --  10.8* 9.2  RBC 3.97  --  2.59* 2.47*  HGB 11.7* 11.6* 7.7* 7.4*  HCT 35.8* 34.0* 22.6* 21.3*  MCV 90.2  --  87.3 86.2  MCH 29.5  --  29.7 30.0  MCHC 32.7  --  34.1 34.7  RDW 13.7  --  13.8 13.8  PLT 230  --  217 212   BNP Recent Labs  Lab 06/20/21 1034  BNP 151.3*    DDimer No results for input(s): DDIMER in the last 168 hours.   Radiology/Studies:  CT HEAD WO CONTRAST  Result Date: 06/20/2021 CLINICAL DATA:  [place] this morning.  On anticoagulants. EXAM: CT HEAD WITHOUT CONTRAST CT CERVICAL SPINE WITHOUT CONTRAST TECHNIQUE: Multidetector CT imaging of the head and cervical spine was performed following the standard protocol without intravenous contrast. Multiplanar CT image reconstructions of the cervical spine were also generated. COMPARISON:  None. FINDINGS: CT HEAD FINDINGS Brain: Mildly enlarged ventricles and cortical sulci. No intracranial hemorrhage, mass lesion or CT evidence of acute infarction. Vascular: No hyperdense vessel or unexpected calcification. Skull: Normal. Negative for fracture or focal lesion. Sinuses/Orbits: Status post bilateral cataract extraction. Unremarkable bones and included paranasal sinuses. Other: None. CT CERVICAL SPINE FINDINGS Alignment: Mild reversal  of the normal cervical lordosis. Mild anterolisthesis at the C3-4 and C6-7 levels. Skull base and vertebrae: No acute fracture. No primary bone lesion or focal pathologic process. Soft tissues and spinal canal: No prevertebral fluid or swelling. No visible canal hematoma. Disc levels:  Multilevel degenerative changes. Upper chest: Clear lung apices. Other: None. IMPRESSION: 1. No skull fracture or intracranial hemorrhage. 2. No cervical spine fracture or traumatic subluxation. 3. Mild diffuse cerebral and cerebellar atrophy. 4. Multilevel cervical and upper thoracic spine degenerative changes. Electronically Signed   By: Claudie Revering M.D.   On: 06/20/2021 11:36   CT CERVICAL SPINE WO CONTRAST  Result Date: 06/20/2021 CLINICAL DATA:  [place] this morning.  On anticoagulants. EXAM: CT HEAD WITHOUT CONTRAST CT CERVICAL SPINE WITHOUT CONTRAST TECHNIQUE: Multidetector CT imaging of the head and cervical spine was performed following the standard protocol without intravenous contrast. Multiplanar CT image reconstructions of the cervical spine were also generated. COMPARISON:  None. FINDINGS: CT HEAD FINDINGS Brain: Mildly enlarged ventricles and cortical sulci. No intracranial hemorrhage, mass lesion or CT evidence of acute infarction. Vascular: No hyperdense vessel or  unexpected calcification. Skull: Normal. Negative for fracture or focal lesion. Sinuses/Orbits: Status post bilateral cataract extraction. Unremarkable bones and included paranasal sinuses. Other: None. CT CERVICAL SPINE FINDINGS Alignment: Mild reversal of the normal cervical lordosis. Mild anterolisthesis at the C3-4 and C6-7 levels. Skull base and vertebrae: No acute fracture. No primary bone lesion or focal pathologic process. Soft tissues and spinal canal: No prevertebral fluid or swelling. No visible canal hematoma. Disc levels:  Multilevel degenerative changes. Upper chest: Clear lung apices. Other: None. IMPRESSION: 1. No skull fracture or  intracranial hemorrhage. 2. No cervical spine fracture or traumatic subluxation. 3. Mild diffuse cerebral and cerebellar atrophy. 4. Multilevel cervical and upper thoracic spine degenerative changes. Electronically Signed   By: Claudie Revering M.D.   On: 06/20/2021 11:36   CT ABDOMEN PELVIS W CONTRAST  Result Date: 06/20/2021 CLINICAL DATA:  [place] this morning.  On anticoagulants. EXAM: CT ABDOMEN AND PELVIS WITH CONTRAST TECHNIQUE: Multidetector CT imaging of the abdomen and pelvis was performed using the standard protocol following bolus administration of intravenous contrast. CONTRAST:  26m OMNIPAQUE IOHEXOL 300 MG/ML  SOLN COMPARISON:  12/25/2004. FINDINGS: Lower chest: Interval 7 mm noncalcified nodule in the medial left lower lobe on image number 29/5. This measured 8 mm in maximum diameter previously in 2006. No new nodules. Atheromatous calcifications, including the coronary arteries and aorta. Hepatobiliary: Diffuse low density of the liver relative to the spleen. Cholecystectomy clips. Pancreas: Stable mildly dilated pancreatic duct with a maximum diameter of 3 mm. No pancreatic mass visualized. Spleen: Normal in size without focal abnormality. Adrenals/Urinary Tract: Normal appearing adrenal glands. Stable small left renal cysts, including an exophytic hemorrhagic or proteinaceous cyst. Small right kidney. Unremarkable ureters and urinary bladder. Stomach/Bowel: Large number of sigmoid and descending colon diverticula without evidence of diverticulitis. Normal appearing appendix. Unremarkable stomach and small bowel. Vascular/Lymphatic: Atheromatous arterial calcifications without aneurysm. No enlarged lymph nodes. Reproductive: Status post hysterectomy. No adnexal masses. Other: Large hematoma extensive edema in the subcutaneous fat of the left buttocks. The hematoma measures 14.2 x 6.1 cm on image number 72/3. Musculoskeletal: No acute fractures, dislocations or subluxations seen. There is an  approximately 80% compression deformity of the L5 vertebra with 6 mm of retropulsion combining with bilateral facet and ligamentum flavum hypertrophy to produce marked canal stenosis at the L4-5 level with mild bilateral foraminal stenosis. Lumbar and lower thoracic spine degenerative changes. IMPRESSION: 1. Large hematoma and associated subcutaneous edema in the subcutaneous fat of the left buttocks. 2. No other acute abnormalities. 3. Diffuse hepatic steatosis. 4. No significant change in a 7 mm noncalcified nodule in the left lower lobe. The long-term stability is compatible with a benign process and this does not need follow-up. 5. Marked sigmoid and descending colon diverticulosis. 6. Old L5 vertebral compression fracture with retropulsion and marked canal stenosis at the L4-5 level. Electronically Signed   By: SClaudie ReveringM.D.   On: 06/20/2021 11:48   DG Pelvis Portable  Result Date: 06/20/2021 CLINICAL DATA:  Status post fall, the patient is on blood thinners. EXAM: PORTABLE PELVIS 1-2 VIEWS COMPARISON:  None. FINDINGS: There is no evidence of pelvic fracture or dislocation. Degenerative joint changes of the lower lumbar spine noted. IMPRESSION: No acute fracture or dislocation. Electronically Signed   By: WAbelardo DieselM.D.   On: 06/20/2021 10:57   DG Chest Port 1 View  Result Date: 06/20/2021 CLINICAL DATA:  Status post fall.  Patient is on blood thinners. EXAM: PORTABLE CHEST 1 VIEW COMPARISON:  August 09 2005 FINDINGS: The heart size and mediastinal contours are within normal limits. There is no focal infiltrate, pulmonary edema, or pleural effusion. Minus scar/identified in both bases. The visualized skeletal structures are stable. IMPRESSION: No active disease. Electronically Signed   By: Abelardo Diesel M.D.   On: 06/20/2021 10:55     Assessment and Plan:   # Syncope:  Ms. Ansell is convinced that her COVID-19 booster caused her syncope.  Prior to that she had no bleeding seeding symptoms.   She has been eating and drinking fairly well.  Her orthostasis now is confounded by her large hematoma and drop in hemoglobin.  She has been appropriately holding her diuretics and receiving IV fluids.  Packed red blood cells are also ordered.  We will continue with current management and to continue to monitor.  Given her history of constrictive pericarditis we will get an echo, though she does not have any evidence of heart failure on exam or by history.  Risk Assessment/Risk Scores:          CHA2DS2-VASc Score = 4   This indicates a 4.8% annual risk of stroke. The patient's score is based upon: CHF History: 0 HTN History: 1 Diabetes History: 0 Stroke History: 0 Vascular Disease History: 0 Age Score: 2 Gender Score: 1        For questions or updates, please contact [place] Please consult [email or web address] for contact info under    Signed, Skeet Latch, MD  06/21/2021 2:38 PM

## 2021-06-21 NOTE — Evaluation (Signed)
Physical Therapy Evaluation Patient Details Name: Yolanda Bell MRN: [id number] DOB: 1940/10/12 Today's Date: 06/21/2021   History of Present Illness  Pt is a 80 y.o. female admitted 9/10 following 2 syncopal episodes at home. She did hit her head and also sustained L hip hematoma. PMH significant of PAF on Xarelto, paroxysmal SVT, and liver scarring (cirrhosis?).   Clinical Impression  Pt admitted with above diagnosis. PTA pt lived alone, active and independent. On eval, pt required supervision bed mobility, and min guard assist sit to stand. Mobility limited by +orthostatics. Orthostatic BP taken by RN just prior to PT eval and pt declining re-attempt. Pt currently with functional limitations due to the deficits listed below. Pt will benefit from skilled PT to increase their independence and safety with mobility to allow discharge home. PT to follow acutely. Suspect she will progress quickly with mobility as orthostatic BP stabilizes and will not require follow up services.                            BP Supine           124/65 Sitting            103/59 Standing         88/47      Follow Up Recommendations No PT follow up    Equipment Recommendations  None recommended by PT    Recommendations for Other Services       Precautions / Restrictions Precautions Precautions: Fall;Other (comment) Precaution Comments: +orthostatics Restrictions Weight Bearing Restrictions: No      Mobility  Bed Mobility Overal bed mobility: Needs Assistance Bed Mobility: Supine to Sit;Sit to Supine     Supine to sit: Supervision;HOB elevated Sit to supine: Supervision;HOB elevated   General bed mobility comments: supervision for safety    Transfers Overall transfer level: Needs assistance Equipment used: 1 person hand held assist Transfers: Sit to/from Stand Sit to Stand: Min guard         General transfer comment: assist for safety. Unable to tolerate extended standing due to  +orthostatics  Ambulation/Gait             General Gait Details: unable due to +orthostatics (symptomatic: dizzy, nauseous)  Stairs            Wheelchair Mobility    Modified Rankin (Stroke Patients Only)       Balance                                             Pertinent Vitals/Pain Pain Assessment: No/denies pain    Home Living Family/patient expects to be discharged to:: Private residence Living Arrangements: Alone Available Help at Discharge: Family;Neighbor;Available PRN/intermittently Type of Home: House Home Access: Stairs to enter     Home Layout: One level Home Equipment: Cane - single point;Walker - 4 wheels      Prior Function Level of Independence: Independent         Comments: Drives. Gardens     Hand Dominance        Extremity/Trunk Assessment   Upper Extremity Assessment Upper Extremity Assessment: Overall WFL for tasks assessed    Lower Extremity Assessment Lower Extremity Assessment: Overall WFL for tasks assessed       Communication   Communication: No difficulties  Cognition Arousal/Alertness: Awake/alert Behavior During Therapy:  WFL for tasks assessed/performed Overall Cognitive Status: Within Functional Limits for tasks assessed                                 General Comments: Pt very tearful over current situation as she is typically very active and independent.      General Comments General comments (skin integrity, edema, etc.): Orthostatic BP: supine 124/65, sitting 103/59, standing 88/47. Resting HR 115    Exercises     Assessment/Plan    PT Assessment Patient needs continued PT services  PT Problem List Decreased mobility;Decreased activity tolerance;Cardiopulmonary status limiting activity       PT Treatment Interventions Therapeutic activities;Gait training;Therapeutic exercise;Patient/family education;Balance training;Stair training;Functional mobility training     PT Goals (Current goals can be found in the Care Plan section)  Acute Rehab PT Goals Patient Stated Goal: get my blood pressure fixed and go home PT Goal Formulation: With patient Time For Goal Achievement: 07/05/21 Potential to Achieve Goals: Good    Frequency Min 3X/week   Barriers to discharge        Co-evaluation               AM-PAC PT "6 Clicks" Mobility  Outcome Measure Help needed turning from your back to your side while in a flat bed without using bedrails?: None Help needed moving from lying on your back to sitting on the side of a flat bed without using bedrails?: None Help needed moving to and from a bed to a chair (including a wheelchair)?: A Little Help needed standing up from a chair using your arms (e.g., wheelchair or bedside chair)?: A Little Help needed to walk in hospital room?: A Little Help needed climbing 3-5 steps with a railing? : A Little 6 Click Score: 20    End of Session   Activity Tolerance: Treatment limited secondary to medical complications (Comment) (orthostatic hypotension) Patient left: in bed;with call bell/phone within reach Nurse Communication: Mobility status PT Visit Diagnosis: Difficulty in walking, not elsewhere classified (R26.2)    Time: [id number] PT Time Calculation (min) (ACUTE ONLY): 25 min   Charges:   PT Evaluation $PT Eval Moderate Complexity: 1 Mod          Lorrin Goodell, PT  Office # [phone] Pager [phone]   Lorriane Shire 06/21/2021, 11:59 AM

## 2021-06-21 NOTE — Progress Notes (Signed)
   06/21/21 0754  Vitals  BP (!) 88/47  MAP (mmHg) 73  Pulse Rate (!) 118  ECG Heart Rate (!) 128  Resp (!) 25  Oxygen Therapy  SpO2 100 %  O2 Device Room Air   Orthostatic vitals completed. Blood pressure dropped 88/47 one minute standing. Unable to stand for 3 minutes due to dizziness and nausea.

## 2021-06-21 NOTE — Progress Notes (Addendum)
PROGRESS NOTE    SHILEE SCHLEIGER  [id number] DOB: 1940/12/11 DOA: 06/20/2021 PCP: Hulan Fess, MD   Brief Narrative: 80 year old with past medical history significant for PAF on Xarelto, paroxysmal SVT, constrictive pericarditis,  liver disease, questionable cirrhosis, who presented after 2 syncope episode.  She had her COVID posterior on Wednesday.  Since then she had severe headache.  On EMS arrival to her house her blood pressure was in the 80s.  Valuation in the ED: CT head negative for acute finding.  CT abdomen and pelvis showed large hematoma to left gluteal area.   Assessment & Plan:   Active Problems:   Gout   PAF (paroxysmal atrial fibrillation) (HCC)   Syncope and collapse   Near syncope   Acute blood loss anemia  1-Syncope: -Possible related to orthostatic hypotension, cannot rule out arrhythmia with her history of A. fib and SVT.  I have consulted cardiology.  She might need Holter monitor. -Plan to start IV fluids. -Worsening orthostatic today due to acute blood loss anemia secondary to gluteal hematoma.  We will proceed with blood transfusion. -Plan to hold diuretics. (torsemide and spironolactone)  2-Acute blood loss anemia, large left gluteal hematoma; Plan to check CBC every 6 hours. Further transfusion as needed.  Has decreased to 7.4 from 11 yesterday. Plan to proceed with 1 unit of packed red blood cell. Hold torsemide and spironolactone. Plan to continue to  hold Xarelto.   3-Orthostatic Hypotension; worsen today by diuretics and blood loss anemia.  IV fluids.  Plan to check orthostatic tomorrow after hydration and blood transfusion. No need to repeat orthostatic tonight.   4-A fib;  Continue with metoprolol ( but once a day)  holder parameter for hypotension.  Holding xarelto due to gluteal hematoma acute blood loss anemia.  Cardiology consulted.   Hypokalemia; replaced.  Hyperkalemia; lab error, repeated 4.2.  Hyperglycemia; transient suspect  related to stress.  A1c 4.6.   Chronic dysphonia.  H/O of constrictive pericarditis S/P pericardial stripping at [facility] clinic   Estimated body mass index is 27.4 kg/m as calculated from the following:   Height as of this encounter: 5' 2.25" (1.581 m).   Weight as of this encounter: 68.5 kg.   DVT prophylaxis: SCD Code Status: Full Code Family Communication: son who was at bedside Disposition Plan:  Status is: Observation  The patient will require care spanning > 2 midnights and should be moved to inpatient because: IV treatments appropriate due to intensity of illness or inability to take PO  Dispo: The patient is from: Home              Anticipated d/c is to: Home              Patient currently is not medically stable to d/c.   Difficult to place patient No        Consultants:  Cardiology   Procedures:  ECHO  Antimicrobials:    Subjective: She was orthostatic this am. BP drop to 80. She is worry about her diuretics, I explain to her the importance of holding diuretics in setting of low BP and low HB. She understood, and agrees to hold meds for now. I reassure her that, we  will be monitoring her volume status.  She said she pass out probably twice at home. The other time she pass out was many years ago, more than 20 years ago after delivering her son.   Objective: Vitals:   06/21/21 1229 06/21/21 1507 06/21/21 1515 06/21/21  1524  BP: 118/73 113/75 115/60 (!) 113/57  Pulse: (!) 111 100 95 94  Resp: '20 16 16 18  '$ Temp: 99.4 F (37.4 C) 98.9 F (37.2 C) 98.7 F (37.1 C) 99 F (37.2 C)  TempSrc: Oral Oral Oral   SpO2:  100% 100%   Weight:      Height:        Intake/Output Summary (Last 24 hours) at 06/21/2021 1758 Last data filed at 06/21/2021 1500 Gross per 24 hour  Intake 1613.07 ml  Output 900 ml  Net 713.07 ml   Filed Weights   06/20/21 1045 06/21/21 0500  Weight: 65 kg 68.5 kg    Examination:  General exam: Appears calm and comfortable   Respiratory system: Clear to auscultation. Respiratory effort normal. Cardiovascular system: S1 & S2 heard, RRR. Gastrointestinal system: Abdomen is nondistended, soft and nontender. No organomegaly or masses felt. Normal bowel sounds heard. Central nervous system: Alert and oriented. No focal neurological deficits. Extremities: no edema   Data Reviewed: I have personally reviewed following labs and imaging studies  CBC: Recent Labs  Lab 06/20/21 1033 06/20/21 1040 06/21/21 0941 06/21/21 1217  WBC 9.6  --  10.8* 9.2  HGB 11.7* 11.6* 7.7* 7.4*  HCT 35.8* 34.0* 22.6* 21.3*  MCV 90.2  --  87.3 86.2  PLT 230  --  217 [id number]   Basic Metabolic Panel: Recent Labs  Lab 06/20/21 1033 06/20/21 1040 06/21/21 0234 06/21/21 0941  NA 138 137 134* 133*  K 3.5 3.5 5.2* 4.2  CL 102 101 101 100  CO2 21*  --  26 25  GLUCOSE 194* 201* 122* 143*  BUN '16 14 15 14  '$ CREATININE 1.14* 1.10* 1.09* 1.00  CALCIUM 8.9  --  8.6* 8.6*   GFR: Estimated Creatinine Clearance: 42.3 mL/min (by C-G formula based on SCr of 1 mg/dL). Liver Function Tests: Recent Labs  Lab 06/20/21 1033  AST 23  ALT 15  ALKPHOS 47  BILITOT 1.1  PROT 6.3*  ALBUMIN 3.8   No results for input(s): LIPASE, AMYLASE in the last 168 hours. No results for input(s): AMMONIA in the last 168 hours. Coagulation Profile: Recent Labs  Lab 06/20/21 1033  INR 2.6*   Cardiac Enzymes: No results for input(s): CKTOTAL, CKMB, CKMBINDEX, TROPONINI in the last 168 hours. BNP (last 3 results) No results for input(s): PROBNP in the last 8760 hours. HbA1C: Recent Labs    06/21/21 0234  HGBA1C 4.6*   CBG: Recent Labs  Lab 06/20/21 1749 06/20/21 2313 06/21/21 0652 06/21/21 1228 06/21/21 1616  GLUCAP 124* 143* 123* 121* 115*   Lipid Profile: No results for input(s): CHOL, HDL, LDLCALC, TRIG, CHOLHDL, LDLDIRECT in the last 72 hours. Thyroid Function Tests: No results for input(s): TSH, T4TOTAL, FREET4, T3FREE, THYROIDAB in  the last 72 hours. Anemia Panel: No results for input(s): VITAMINB12, FOLATE, FERRITIN, TIBC, IRON, RETICCTPCT in the last 72 hours. Sepsis Labs: No results for input(s): PROCALCITON, LATICACIDVEN in the last 168 hours.  Recent Results (from the past 240 hour(s))  Resp Panel by RT-PCR (Flu A&B, Covid) Nasopharyngeal Swab     Status: None   Collection Time: 06/20/21 10:33 AM   Specimen: Nasopharyngeal Swab; Nasopharyngeal(NP) swabs in vial transport medium  Result Value Ref Range Status   SARS Coronavirus 2 by RT PCR NEGATIVE NEGATIVE Final    Comment: (NOTE) SARS-CoV-2 target nucleic acids are NOT DETECTED.  The SARS-CoV-2 RNA is generally detectable in upper respiratory specimens during the acute phase of  infection. The lowest concentration of SARS-CoV-2 viral copies this assay can detect is 138 copies/mL. A negative result does not preclude SARS-Cov-2 infection and should not be used as the sole basis for treatment or other patient management decisions. A negative result may occur with  improper specimen collection/handling, submission of specimen other than nasopharyngeal swab, presence of viral mutation(s) within the areas targeted by this assay, and inadequate number of viral copies(<138 copies/mL). A negative result must be combined with clinical observations, patient history, and epidemiological information. The expected result is Negative.  Fact Sheet for Patients:  EntrepreneurPulse.com.au  Fact Sheet for Healthcare Providers:  IncredibleEmployment.be  This test is no t yet approved or cleared by the [place] FDA and  has been authorized for detection and/or diagnosis of SARS-CoV-2 by FDA under an Emergency Use Authorization (EUA). This EUA will remain  in effect (meaning this test can be used) for the duration of the COVID-19 declaration under Section 564(b)(1) of the Act, 21 U.S.C.section 360bbb-3(b)(1), unless the  authorization is terminated  or revoked sooner.       Influenza A by PCR NEGATIVE NEGATIVE Final   Influenza B by PCR NEGATIVE NEGATIVE Final    Comment: (NOTE) The Xpert Xpress SARS-CoV-2/FLU/RSV plus assay is intended as an aid in the diagnosis of influenza from Nasopharyngeal swab specimens and should not be used as a sole basis for treatment. Nasal washings and aspirates are unacceptable for Xpert Xpress SARS-CoV-2/FLU/RSV testing.  Fact Sheet for Patients: EntrepreneurPulse.com.au  Fact Sheet for Healthcare Providers: IncredibleEmployment.be  This test is not yet approved or cleared by the [place] FDA and has been authorized for detection and/or diagnosis of SARS-CoV-2 by FDA under an Emergency Use Authorization (EUA). This EUA will remain in effect (meaning this test can be used) for the duration of the COVID-19 declaration under Section 564(b)(1) of the Act, 21 U.S.C. section 360bbb-3(b)(1), unless the authorization is terminated or revoked.  Performed at [facility], [place] [address], [place], [place] [address]          Radiology Studies: CT HEAD WO CONTRAST  Result Date: 06/20/2021 CLINICAL DATA:  [place] this morning.  On anticoagulants. EXAM: CT HEAD WITHOUT CONTRAST CT CERVICAL SPINE WITHOUT CONTRAST TECHNIQUE: Multidetector CT imaging of the head and cervical spine was performed following the standard protocol without intravenous contrast. Multiplanar CT image reconstructions of the cervical spine were also generated. COMPARISON:  None. FINDINGS: CT HEAD FINDINGS Brain: Mildly enlarged ventricles and cortical sulci. No intracranial hemorrhage, mass lesion or CT evidence of acute infarction. Vascular: No hyperdense vessel or unexpected calcification. Skull: Normal. Negative for fracture or focal lesion. Sinuses/Orbits: Status post bilateral cataract extraction. Unremarkable bones and included paranasal sinuses. Other:  None. CT CERVICAL SPINE FINDINGS Alignment: Mild reversal of the normal cervical lordosis. Mild anterolisthesis at the C3-4 and C6-7 levels. Skull base and vertebrae: No acute fracture. No primary bone lesion or focal pathologic process. Soft tissues and spinal canal: No prevertebral fluid or swelling. No visible canal hematoma. Disc levels:  Multilevel degenerative changes. Upper chest: Clear lung apices. Other: None. IMPRESSION: 1. No skull fracture or intracranial hemorrhage. 2. No cervical spine fracture or traumatic subluxation. 3. Mild diffuse cerebral and cerebellar atrophy. 4. Multilevel cervical and upper thoracic spine degenerative changes. Electronically Signed   By: Claudie Revering M.D.   On: 06/20/2021 11:36   CT CERVICAL SPINE WO CONTRAST  Result Date: 06/20/2021 CLINICAL DATA:  [place] this morning.  On anticoagulants. EXAM: CT HEAD WITHOUT  CONTRAST CT CERVICAL SPINE WITHOUT CONTRAST TECHNIQUE: Multidetector CT imaging of the head and cervical spine was performed following the standard protocol without intravenous contrast. Multiplanar CT image reconstructions of the cervical spine were also generated. COMPARISON:  None. FINDINGS: CT HEAD FINDINGS Brain: Mildly enlarged ventricles and cortical sulci. No intracranial hemorrhage, mass lesion or CT evidence of acute infarction. Vascular: No hyperdense vessel or unexpected calcification. Skull: Normal. Negative for fracture or focal lesion. Sinuses/Orbits: Status post bilateral cataract extraction. Unremarkable bones and included paranasal sinuses. Other: None. CT CERVICAL SPINE FINDINGS Alignment: Mild reversal of the normal cervical lordosis. Mild anterolisthesis at the C3-4 and C6-7 levels. Skull base and vertebrae: No acute fracture. No primary bone lesion or focal pathologic process. Soft tissues and spinal canal: No prevertebral fluid or swelling. No visible canal hematoma. Disc levels:  Multilevel degenerative changes. Upper chest: Clear lung apices.  Other: None. IMPRESSION: 1. No skull fracture or intracranial hemorrhage. 2. No cervical spine fracture or traumatic subluxation. 3. Mild diffuse cerebral and cerebellar atrophy. 4. Multilevel cervical and upper thoracic spine degenerative changes. Electronically Signed   By: Claudie Revering M.D.   On: 06/20/2021 11:36   CT ABDOMEN PELVIS W CONTRAST  Result Date: 06/20/2021 CLINICAL DATA:  [place] this morning.  On anticoagulants. EXAM: CT ABDOMEN AND PELVIS WITH CONTRAST TECHNIQUE: Multidetector CT imaging of the abdomen and pelvis was performed using the standard protocol following bolus administration of intravenous contrast. CONTRAST:  64m OMNIPAQUE IOHEXOL 300 MG/ML  SOLN COMPARISON:  12/25/2004. FINDINGS: Lower chest: Interval 7 mm noncalcified nodule in the medial left lower lobe on image number 29/5. This measured 8 mm in maximum diameter previously in 2006. No new nodules. Atheromatous calcifications, including the coronary arteries and aorta. Hepatobiliary: Diffuse low density of the liver relative to the spleen. Cholecystectomy clips. Pancreas: Stable mildly dilated pancreatic duct with a maximum diameter of 3 mm. No pancreatic mass visualized. Spleen: Normal in size without focal abnormality. Adrenals/Urinary Tract: Normal appearing adrenal glands. Stable small left renal cysts, including an exophytic hemorrhagic or proteinaceous cyst. Small right kidney. Unremarkable ureters and urinary bladder. Stomach/Bowel: Large number of sigmoid and descending colon diverticula without evidence of diverticulitis. Normal appearing appendix. Unremarkable stomach and small bowel. Vascular/Lymphatic: Atheromatous arterial calcifications without aneurysm. No enlarged lymph nodes. Reproductive: Status post hysterectomy. No adnexal masses. Other: Large hematoma extensive edema in the subcutaneous fat of the left buttocks. The hematoma measures 14.2 x 6.1 cm on image number 72/3. Musculoskeletal: No acute fractures,  dislocations or subluxations seen. There is an approximately 80% compression deformity of the L5 vertebra with 6 mm of retropulsion combining with bilateral facet and ligamentum flavum hypertrophy to produce marked canal stenosis at the L4-5 level with mild bilateral foraminal stenosis. Lumbar and lower thoracic spine degenerative changes. IMPRESSION: 1. Large hematoma and associated subcutaneous edema in the subcutaneous fat of the left buttocks. 2. No other acute abnormalities. 3. Diffuse hepatic steatosis. 4. No significant change in a 7 mm noncalcified nodule in the left lower lobe. The long-term stability is compatible with a benign process and this does not need follow-up. 5. Marked sigmoid and descending colon diverticulosis. 6. Old L5 vertebral compression fracture with retropulsion and marked canal stenosis at the L4-5 level. Electronically Signed   By: SClaudie ReveringM.D.   On: 06/20/2021 11:48   DG Pelvis Portable  Result Date: 06/20/2021 CLINICAL DATA:  Status post fall, the patient is on blood thinners. EXAM: PORTABLE PELVIS 1-2 VIEWS COMPARISON:  None. FINDINGS: There  is no evidence of pelvic fracture or dislocation. Degenerative joint changes of the lower lumbar spine noted. IMPRESSION: No acute fracture or dislocation. Electronically Signed   By: Abelardo Diesel M.D.   On: 06/20/2021 10:57   DG Chest Port 1 View  Result Date: 06/20/2021 CLINICAL DATA:  Status post fall.  Patient is on blood thinners. EXAM: PORTABLE CHEST 1 VIEW COMPARISON:  August 09 2005 FINDINGS: The heart size and mediastinal contours are within normal limits. There is no focal infiltrate, pulmonary edema, or pleural effusion. Minus scar/identified in both bases. The visualized skeletal structures are stable. IMPRESSION: No active disease. Electronically Signed   By: Abelardo Diesel M.D.   On: 06/20/2021 10:55        Scheduled Meds:  sodium chloride   Intravenous Once   sodium chloride   Intravenous Once    cholecalciferol  1,000 Units Oral Daily   insulin aspart  0-9 Units Subcutaneous TID WC   metoprolol succinate  25 mg Oral Daily   potassium chloride  10 mEq Oral Daily   sodium chloride flush  3 mL Intravenous Q12H   spironolactone  25 mg Oral Daily   Continuous Infusions:  sodium chloride 75 mL/hr at 06/21/21 1313     LOS: 0 days    Time spent: 35 minutes.     Elmarie Shiley, MD [facility]   If 7PM-7AM, please contact night-coverage [email or web address]  06/21/2021, 5:58 PM

## 2021-06-22 LAB — BASIC METABOLIC PANEL
Anion gap: 5 (ref 5–15)
BUN: 11 mg/dL (ref 8–23)
CO2: 26 mmol/L (ref 22–32)
Calcium: 8.5 mg/dL — ABNORMAL LOW (ref 8.9–10.3)
Chloride: 104 mmol/L (ref 98–111)
Creatinine, Ser: 0.73 mg/dL (ref 0.44–1.00)
GFR, Estimated: >60 mL/min (ref >60)
Glucose, Bld: 98 mg/dL (ref 70–99)
Potassium: 4.3 mmol/L (ref 3.5–5.1)
Sodium: 135 mmol/L (ref 135–145)

## 2021-06-22 LAB — CBC
HCT: 21.3 % — ABNORMAL LOW (ref 36.0–46.0)
HCT: 27.2 % — ABNORMAL LOW (ref 36.0–46.0)
HCT: 24.6 % — ABNORMAL LOW (ref 36.0–46.0)
Hemoglobin: 7.4 g/dL — ABNORMAL LOW (ref 12.0–15.0)
Hemoglobin: 9.2 g/dL — ABNORMAL LOW (ref 12.0–15.0)
Hemoglobin: 8.3 g/dL — ABNORMAL LOW (ref 12.0–15.0)
MCH: 29.8 pg (ref 26.0–34.0)
MCH: 30.4 pg (ref 26.0–34.0)
MCH: 29.6 pg (ref 26.0–34.0)
MCHC: 34.7 g/dL (ref 30.0–36.0)
MCHC: 33.8 g/dL (ref 30.0–36.0)
MCHC: 33.7 g/dL (ref 30.0–36.0)
MCV: 85.9 fL (ref 80.0–100.0)
MCV: 89.8 fL (ref 80.0–100.0)
MCV: 87.9 fL (ref 80.0–100.0)
Platelets: 147 10*3/uL — ABNORMAL LOW (ref 150–400)
Platelets: 163 10*3/uL (ref 150–400)
Platelets: 141 10*3/uL — ABNORMAL LOW (ref 150–400)
RBC: 2.48 MIL/uL — ABNORMAL LOW (ref 3.87–5.11)
RBC: 3.03 MIL/uL — ABNORMAL LOW (ref 3.87–5.11)
RBC: 2.8 MIL/uL — ABNORMAL LOW (ref 3.87–5.11)
RDW: 14.3 % (ref 11.5–15.5)
RDW: 14.6 % (ref 11.5–15.5)
RDW: 14.4 % (ref 11.5–15.5)
WBC: 5.7 10*3/uL (ref 4.0–10.5)
WBC: 6 10*3/uL (ref 4.0–10.5)
WBC: 5.6 10*3/uL (ref 4.0–10.5)
nRBC: 0 % (ref 0.0–0.2)
nRBC: 0 % (ref 0.0–0.2)
nRBC: 0 % (ref 0.0–0.2)

## 2021-06-22 LAB — GLUCOSE, CAPILLARY
Glucose-Capillary: 93 mg/dL (ref 70–99)
Glucose-Capillary: 91 mg/dL (ref 70–99)
Glucose-Capillary: 104 mg/dL — ABNORMAL HIGH (ref 70–99)
Glucose-Capillary: 84 mg/dL (ref 70–99)

## 2021-06-22 LAB — PROTIME-INR
INR: 1.1 (ref 0.8–1.2)
Prothrombin Time: 13.8 seconds (ref 11.4–15.2)

## 2021-06-22 LAB — PREPARE RBC (CROSSMATCH)

## 2021-06-22 MED ORDER — SODIUM CHLORIDE 0.9% IV SOLUTION: Freq: Once | INTRAVENOUS | Status: AC

## 2021-06-22 NOTE — Progress Notes (Signed)
Physical Therapy Treatment Patient Details Name: Yolanda Bell MRN: [id number] DOB: July 08, 1941 Today's Date: 06/22/2021   History of Present Illness Pt is a 80 y.o. female admitted 9/10 following 2 syncopal episodes at home. She did hit her head and also sustained L hip hematoma. Received 2 units of blood on 9/11. PMH significant of PAF on Xarelto, paroxysmal SVT, and liver scarring (cirrhosis?).    PT Comments    Pt is progressing toward her goals. Orthostatic measurements taken; results negative and pt asymptomatic (see table below). Pt supervision to min guard for all mobility tasks today, including ambulation in the hallway with RW. Vitals and symptoms monitored throughout; all WFL. Educated pt on using RW during recovery for increased safety and stability as well as walking program with supervision. Current discharge plan remains appropriate. We will continue to follow her acutely to promote independence with functional mobility.     06/22/21 1340 06/22/21 1343  Orthostatic Lying   BP- Lying 119/55  --   Pulse- Lying 69  --   Orthostatic Sitting  BP- Sitting 110/85  --   Pulse- Sitting 77  --   Orthostatic Standing at 0 minutes  BP- Standing at 0 minutes 115/69  --   Pulse- Standing at 0 minutes 81  --   Orthostatic Standing at 3 minutes  BP- Standing at 3 minutes 108/73 133/64  Pulse- Standing at 3 minutes 106 100 (during mobility)     Recommendations for follow up therapy are one component of a multi-disciplinary discharge planning process, led by the attending physician.  Recommendations may be updated based on patient status, additional functional criteria and insurance authorization.  Follow Up Recommendations  No PT follow up     Equipment Recommendations  None recommended by PT    Recommendations for Other Services       Precautions / Restrictions Precautions Precautions: Fall;Other (comment) Precaution Comments: Previous +orthostatics, watch  BP Restrictions Weight Bearing Restrictions: No     Mobility  Bed Mobility Overal bed mobility: Needs Assistance Bed Mobility: Supine to Sit     Supine to sit: Supervision;HOB elevated     General bed mobility comments: Pt supervision for safety and line management only. Orthostatics negative (see flowsheet), pt asymptomatic.    Transfers Overall transfer level: Needs assistance Equipment used: Rolling walker (2 wheeled) Transfers: Sit to/from Stand Sit to Stand: Min guard         General transfer comment: Pt min guard for safety and line management only. Orthostatics negative (see flowsheet), pt asymptomatic.  Ambulation/Gait Ambulation/Gait assistance: Supervision;Min guard;+2 safety/equipment Gait Distance (Feet): 155 Feet Assistive device: Rolling walker (2 wheeled) Gait Pattern/deviations: Step-through pattern;Narrow base of support;Decreased stride length Gait velocity: Decreased   General Gait Details: Pt supervision to min guard for safety, +2 for line management only. Pt ambulated with RW from EOB to restroom, stood while washing hands, and ambulated in the hallway. Demonstrated narrow base of support with decreased stride length and DF bilaterally. No overt LOB noted, pt asymptomatic. Educated pt on using RW during recovery to increase safety and stability during ambulation; pt verbalized agreement.   Stairs             Wheelchair Mobility    Modified Rankin (Stroke Patients Only)       Balance Overall balance assessment: Needs assistance Sitting-balance support: Feet supported;No upper extremity supported Sitting balance-Leahy Scale: Fair     Standing balance support: Bilateral upper extremity supported;No upper extremity supported;During functional activity Standing balance-Leahy Scale:  Fair Standing balance comment: Pt able to maintain static stance during BP measurements without UE support. Pt reliant on BUE on RW during functional mobility  tasks.                            Cognition Arousal/Alertness: Awake/alert Behavior During Therapy: WFL for tasks assessed/performed Overall Cognitive Status: Within Functional Limits for tasks assessed                                 General Comments: Pt somewhat anxious about mobility at entry but by end of session very excited about the progress she made today.      Exercises      General Comments General comments (skin integrity, edema, etc.): Pt negative for orthostatics (see flowsheet). IV was pulled out during transition to recliner; RN notified.      Pertinent Vitals/Pain Pain Assessment: 0-10 Pain Score: 4  Pain Location: right ribs (pec tendons) Pain Descriptors / Indicators: Discomfort;Guarding;Spasm Pain Intervention(s): Monitored during session    Home Living                      Prior Function            PT Goals (current goals can now be found in the care plan section) Acute Rehab PT Goals Patient Stated Goal: To get moving again. PT Goal Formulation: With patient Time For Goal Achievement: 07/05/21 Potential to Achieve Goals: Good Progress towards PT goals: Progressing toward goals    Frequency    Min 3X/week      PT Plan Current plan remains appropriate    Co-evaluation              AM-PAC PT "6 Clicks" Mobility   Outcome Measure  Help needed turning from your back to your side while in a flat bed without using bedrails?: None Help needed moving from lying on your back to sitting on the side of a flat bed without using bedrails?: None Help needed moving to and from a bed to a chair (including a wheelchair)?: A Little Help needed standing up from a chair using your arms (e.g., wheelchair or bedside chair)?: A Little Help needed to walk in hospital room?: A Little Help needed climbing 3-5 steps with a railing? : A Little 6 Click Score: 20    End of Session Equipment Utilized During Treatment: Gait  belt Activity Tolerance: Patient tolerated treatment well Patient left: in chair;with call bell/phone within reach Nurse Communication: Mobility status;Other (comment) (IV status) PT Visit Diagnosis: Pain;Difficulty in walking, not elsewhere classified (R26.2);Muscle weakness (generalized) (M62.81);History of falling (Z91.81) Pain - Right/Left: Right Pain - part of body: Shoulder     Time: 1337-1410 PT Time Calculation (min) (ACUTE ONLY): 33 min  Charges:  $Gait Training: 8-22 mins $Therapeutic Activity: 8-22 mins                     Dawayne Cirri, SPT Dawayne Cirri 06/22/2021, 2:44 PM

## 2021-06-22 NOTE — Progress Notes (Signed)
Cardiology Progress Note  Patient ID: Yolanda Bell MRN: [id number] DOB: 1941-08-29 Date of Encounter: 06/22/2021  Primary Cardiologist: Mertie Moores, MD  Subjective   Chief Complaint: R gluteal pain  HPI: Still orthostatic. With acute blood loss anemia 2/2 hip hematoma. Tele normal.   ROS:  All other ROS reviewed and negative. Pertinent positives noted in the HPI.     Inpatient Medications  Scheduled Meds:  sodium chloride   Intravenous Once   sodium chloride   Intravenous Once   sodium chloride   Intravenous Once   cholecalciferol  1,000 Units Oral Daily   insulin aspart  0-9 Units Subcutaneous TID WC   melatonin  3 mg Oral QHS   metoprolol succinate  25 mg Oral Daily   sodium chloride flush  3 mL Intravenous Q12H   Continuous Infusions:  sodium chloride 75 mL/hr at 06/21/21 1918   PRN Meds: acetaminophen **OR** acetaminophen, acetaminophen, ondansetron **OR** ondansetron (ZOFRAN) IV   Vital Signs   Vitals:   06/21/21 2331 06/22/21 0330 06/22/21 0731 06/22/21 0822  BP: (!) 111/49 (!) 96/52 (!) 107/43 (!) 111/52  Pulse: 74 84 75 79  Resp: '15 15 18 20  '$ Temp:  98 F (36.7 C)  97.8 F (36.6 C)  TempSrc:    Oral  SpO2: 100% 100% 97% 98%  Weight:      Height:        Intake/Output Summary (Last 24 hours) at 06/22/2021 0958 Last data filed at 06/22/2021 0500 Gross per 24 hour  Intake 1449.01 ml  Output 2600 ml  Net -1150.99 ml   Last 3 Weights 06/21/2021 06/20/2021 12/15/2020  Weight (lbs) 151 lb 0.2 oz 143 lb 4.8 oz 150 lb 12.8 oz  Weight (kg) 68.5 kg 65 kg 68.402 kg      Telemetry  Overnight telemetry shows SR 70s with PACs, which I personally reviewed.   ECG  The most recent ECG shows SR 87 without acute ST/T changes, which I personally reviewed.   Physical Exam   Vitals:   06/21/21 2331 06/22/21 0330 06/22/21 0731 06/22/21 0822  BP: (!) 111/49 (!) 96/52 (!) 107/43 (!) 111/52  Pulse: 74 84 75 79  Resp: '15 15 18 20  '$ Temp:  98 F (36.7 C)  97.8 F  (36.6 C)  TempSrc:    Oral  SpO2: 100% 100% 97% 98%  Weight:      Height:        Intake/Output Summary (Last 24 hours) at 06/22/2021 0958 Last data filed at 06/22/2021 0500 Gross per 24 hour  Intake 1449.01 ml  Output 2600 ml  Net -1150.99 ml    Last 3 Weights 06/21/2021 06/20/2021 12/15/2020  Weight (lbs) 151 lb 0.2 oz 143 lb 4.8 oz 150 lb 12.8 oz  Weight (kg) 68.5 kg 65 kg 68.402 kg    Body mass index is 27.4 kg/m.   General: Well nourished, well developed, in no acute distress Head: Atraumatic, normal size  Eyes: PEERLA, EOMI  Neck: Supple, no JVD Endocrine: No thryomegaly Cardiac: Normal S1, S2; RRR; no murmurs, rubs, or gallops Lungs: Clear to auscultation bilaterally, no wheezing, rhonchi or rales  Abd: Soft, nontender, no hepatomegaly  Ext: No edema, pulses 2+, L hip hematoma  Musculoskeletal: No deformities, BUE and BLE strength normal and equal Skin: Warm and dry, no rashes   Neuro: Alert and oriented to person, place, time, and situation, CNII-XII grossly intact, no focal deficits  Psych: Normal mood and affect   Labs  High  Sensitivity Troponin:   Recent Labs  Lab 06/20/21 1033  TROPONINIHS 4     Cardiac EnzymesNo results for input(s): TROPONINI in the last 168 hours. No results for input(s): TROPIPOC in the last 168 hours.  Chemistry Recent Labs  Lab 06/20/21 1033 06/20/21 1040 06/21/21 0234 06/21/21 0941 06/22/21 0332  NA 138   < > 134* 133* 135  K 3.5   < > 5.2* 4.2 4.3  CL 102   < > 101 100 104  CO2 21*  --  '26 25 26  '$ GLUCOSE 194*   < > 122* 143* 98  BUN 16   < > '15 14 11  '$ CREATININE 1.14*   < > 1.09* 1.00 0.73  CALCIUM 8.9  --  8.6* 8.6* 8.5*  PROT 6.3*  --   --   --   --   ALBUMIN 3.8  --   --   --   --   AST 23  --   --   --   --   ALT 15  --   --   --   --   ALKPHOS 47  --   --   --   --   BILITOT 1.1  --   --   --   --   GFRNONAA 49*  --  52* 58* >60  ANIONGAP 15  --  '7 8 5   '$ < > = values in this interval not displayed.     Hematology Recent Labs  Lab 06/21/21 0941 06/21/21 1217 06/22/21 0332  WBC 10.8* 9.2 5.7  RBC 2.59* 2.47* 2.48*  HGB 7.7* 7.4* 7.4*  HCT 22.6* 21.3* 21.3*  MCV 87.3 86.2 85.9  MCH 29.7 30.0 29.8  MCHC 34.1 34.7 34.7  RDW 13.8 13.8 14.3  PLT 217 212 147*   BNP Recent Labs  Lab 06/20/21 1034  BNP 151.3*    DDimer No results for input(s): DDIMER in the last 168 hours.   Radiology  CT HEAD WO CONTRAST  Result Date: 06/20/2021 CLINICAL DATA:  [place] this morning.  On anticoagulants. EXAM: CT HEAD WITHOUT CONTRAST CT CERVICAL SPINE WITHOUT CONTRAST TECHNIQUE: Multidetector CT imaging of the head and cervical spine was performed following the standard protocol without intravenous contrast. Multiplanar CT image reconstructions of the cervical spine were also generated. COMPARISON:  None. FINDINGS: CT HEAD FINDINGS Brain: Mildly enlarged ventricles and cortical sulci. No intracranial hemorrhage, mass lesion or CT evidence of acute infarction. Vascular: No hyperdense vessel or unexpected calcification. Skull: Normal. Negative for fracture or focal lesion. Sinuses/Orbits: Status post bilateral cataract extraction. Unremarkable bones and included paranasal sinuses. Other: None. CT CERVICAL SPINE FINDINGS Alignment: Mild reversal of the normal cervical lordosis. Mild anterolisthesis at the C3-4 and C6-7 levels. Skull base and vertebrae: No acute fracture. No primary bone lesion or focal pathologic process. Soft tissues and spinal canal: No prevertebral fluid or swelling. No visible canal hematoma. Disc levels:  Multilevel degenerative changes. Upper chest: Clear lung apices. Other: None. IMPRESSION: 1. No skull fracture or intracranial hemorrhage. 2. No cervical spine fracture or traumatic subluxation. 3. Mild diffuse cerebral and cerebellar atrophy. 4. Multilevel cervical and upper thoracic spine degenerative changes. Electronically Signed   By: Claudie Revering M.D.   On: 06/20/2021 11:36   CT CERVICAL  SPINE WO CONTRAST  Result Date: 06/20/2021 CLINICAL DATA:  [place] this morning.  On anticoagulants. EXAM: CT HEAD WITHOUT CONTRAST CT CERVICAL SPINE WITHOUT CONTRAST TECHNIQUE: Multidetector CT imaging of the head and cervical  spine was performed following the standard protocol without intravenous contrast. Multiplanar CT image reconstructions of the cervical spine were also generated. COMPARISON:  None. FINDINGS: CT HEAD FINDINGS Brain: Mildly enlarged ventricles and cortical sulci. No intracranial hemorrhage, mass lesion or CT evidence of acute infarction. Vascular: No hyperdense vessel or unexpected calcification. Skull: Normal. Negative for fracture or focal lesion. Sinuses/Orbits: Status post bilateral cataract extraction. Unremarkable bones and included paranasal sinuses. Other: None. CT CERVICAL SPINE FINDINGS Alignment: Mild reversal of the normal cervical lordosis. Mild anterolisthesis at the C3-4 and C6-7 levels. Skull base and vertebrae: No acute fracture. No primary bone lesion or focal pathologic process. Soft tissues and spinal canal: No prevertebral fluid or swelling. No visible canal hematoma. Disc levels:  Multilevel degenerative changes. Upper chest: Clear lung apices. Other: None. IMPRESSION: 1. No skull fracture or intracranial hemorrhage. 2. No cervical spine fracture or traumatic subluxation. 3. Mild diffuse cerebral and cerebellar atrophy. 4. Multilevel cervical and upper thoracic spine degenerative changes. Electronically Signed   By: Claudie Revering M.D.   On: 06/20/2021 11:36   CT ABDOMEN PELVIS W CONTRAST  Result Date: 06/20/2021 CLINICAL DATA:  [place] this morning.  On anticoagulants. EXAM: CT ABDOMEN AND PELVIS WITH CONTRAST TECHNIQUE: Multidetector CT imaging of the abdomen and pelvis was performed using the standard protocol following bolus administration of intravenous contrast. CONTRAST:  53m OMNIPAQUE IOHEXOL 300 MG/ML  SOLN COMPARISON:  12/25/2004. FINDINGS: Lower chest: Interval 7  mm noncalcified nodule in the medial left lower lobe on image number 29/5. This measured 8 mm in maximum diameter previously in 2006. No new nodules. Atheromatous calcifications, including the coronary arteries and aorta. Hepatobiliary: Diffuse low density of the liver relative to the spleen. Cholecystectomy clips. Pancreas: Stable mildly dilated pancreatic duct with a maximum diameter of 3 mm. No pancreatic mass visualized. Spleen: Normal in size without focal abnormality. Adrenals/Urinary Tract: Normal appearing adrenal glands. Stable small left renal cysts, including an exophytic hemorrhagic or proteinaceous cyst. Small right kidney. Unremarkable ureters and urinary bladder. Stomach/Bowel: Large number of sigmoid and descending colon diverticula without evidence of diverticulitis. Normal appearing appendix. Unremarkable stomach and small bowel. Vascular/Lymphatic: Atheromatous arterial calcifications without aneurysm. No enlarged lymph nodes. Reproductive: Status post hysterectomy. No adnexal masses. Other: Large hematoma extensive edema in the subcutaneous fat of the left buttocks. The hematoma measures 14.2 x 6.1 cm on image number 72/3. Musculoskeletal: No acute fractures, dislocations or subluxations seen. There is an approximately 80% compression deformity of the L5 vertebra with 6 mm of retropulsion combining with bilateral facet and ligamentum flavum hypertrophy to produce marked canal stenosis at the L4-5 level with mild bilateral foraminal stenosis. Lumbar and lower thoracic spine degenerative changes. IMPRESSION: 1. Large hematoma and associated subcutaneous edema in the subcutaneous fat of the left buttocks. 2. No other acute abnormalities. 3. Diffuse hepatic steatosis. 4. No significant change in a 7 mm noncalcified nodule in the left lower lobe. The long-term stability is compatible with a benign process and this does not need follow-up. 5. Marked sigmoid and descending colon diverticulosis. 6. Old  L5 vertebral compression fracture with retropulsion and marked canal stenosis at the L4-5 level. Electronically Signed   By: SClaudie ReveringM.D.   On: 06/20/2021 11:48   DG Pelvis Portable  Result Date: 06/20/2021 CLINICAL DATA:  Status post fall, the patient is on blood thinners. EXAM: PORTABLE PELVIS 1-2 VIEWS COMPARISON:  None. FINDINGS: There is no evidence of pelvic fracture or dislocation. Degenerative joint changes of the lower lumbar  spine noted. IMPRESSION: No acute fracture or dislocation. Electronically Signed   By: Abelardo Diesel M.D.   On: 06/20/2021 10:57   DG Chest Port 1 View  Result Date: 06/20/2021 CLINICAL DATA:  Status post fall.  Patient is on blood thinners. EXAM: PORTABLE CHEST 1 VIEW COMPARISON:  August 09 2005 FINDINGS: The heart size and mediastinal contours are within normal limits. There is no focal infiltrate, pulmonary edema, or pleural effusion. Minus scar/identified in both bases. The visualized skeletal structures are stable. IMPRESSION: No active disease. Electronically Signed   By: Abelardo Diesel M.D.   On: 06/20/2021 10:55    Cardiac Studies  TTE pending   Patient Profile  CRISSIE LEOS is a 80 y.o. female with constrictive pericarditis, atrial fibrillation/flutter s/p ablation, SVT, HTN who was admitted 06/20/2021 for syncope.   Assessment & Plan   #Syncope, vasovagal vs orthostatic  -admitted with syncopal event early in the morning while going to the bathroom. Described dizziness, blurry vision, and nausea prior to the event. Suspect vasovagal event.  This all occurred within several days of receiving her booster vaccine.  She reports not feeling well for several days after the booster.  Suspect this could have contributed.  May be more her symptoms and poor p.o. intake versus the actual booster itself but clear to delineate the 2. -could be from too much HTN medication -Regardless appears to be benign.  No red flag symptoms such as chest pain or shortness of  breath before the episode.  No seizure-like activity reported.  She came back very quickly. -We will check an echocardiogram given her history of constrictive pericarditis status post pericardial stripping but I do not suspect this will have any thing to do with her symptoms.  #Left buttock hematoma -She is on Xarelto for history of atrial fibrillation and flutter.  There is significant hematoma with drop in hemoglobin.  Hold Xarelto for now. -We will treat this supportively. -Likely will be okay to go back on Xarelto but bleeding must stop and she must be stable.  Right now I am not sure that she has.  #Atrial fibrillation/flutter status post ablation -No recurrence.  Holding Xarelto as above.    For questions or updates, please contact [place] Please consult [email or web address] for contact info under   Time Spent with Patient: I have spent a total of 35 minutes with patient reviewing hospital notes, telemetry, EKGs, labs and examining the patient as well as establishing an assessment and plan that was discussed with the patient.  > 50% of time was spent in direct patient care.    Signed, Addison Naegeli. Audie Box, MD, [place]  06/22/2021 9:58 AM

## 2021-06-22 NOTE — Progress Notes (Signed)
Physical Therapy Progress Note   06/22/21 1340 06/22/21 1343  Orthostatic Lying   BP- Lying 119/55  --   Pulse- Lying 69  --   Orthostatic Sitting  BP- Sitting 110/85  --   Pulse- Sitting 77  --   Orthostatic Standing at 0 minutes  BP- Standing at 0 minutes 115/69  --   Pulse- Standing at 0 minutes 81  --   Orthostatic Standing at 3 minutes  BP- Standing at 3 minutes 108/73 133/64  Pulse- Standing at 3 minutes 106 100 (Taken midsession during mobility)   Dawayne Cirri, SPT

## 2021-06-22 NOTE — Consult Note (Signed)
Reason for Consult:Left gluteal hematoma Referring Physician: Niel Hummer Time called: [id number] Time at bedside: [place] is an 80 y.o. female.  HPI: Yolanda Bell got up in the very early morning a couple of nights ago and suffered a syncopal event and fell. She developed significant left hip pain and eventually came to the ED for evaluation. CT showed a large left gluteal hematoma and orthopedic surgery was consulted a couple of days later. She lives at home alone.  Past Medical History:  Diagnosis Date   Atrial fibrillation ([place])    Status post TEE cardioversion   Constrictive pericarditis    2 status post pericardial stripping at the [facility]   Gout    Hypertension    Spastic dysphonia    Supraventricular tachycardia ([place])    Status post RF ablation by Dr. Peterson Lombard    Past Surgical History:  Procedure Laterality Date   [place]  11/21/2012   Procedure: CARDIOVERSION;  Surgeon: Darlin Coco, MD;  Location: [place];  Service: Cardiovascular;;   CARDIOVERSION N/A 06/10/2014   Procedure: CARDIOVERSION;  Surgeon: Thayer Headings, MD;  Location: [place];  Service: Cardiovascular;  Laterality: N/A;   CARDIOVERSION N/A 10/14/2015   Procedure: CARDIOVERSION;  Surgeon: Josue Hector, MD;  Location: [facility] ENDOSCOPY;  Service: Cardiovascular;  Laterality: N/A;   CARDIOVERSION N/A 05/12/2016   Procedure: CARDIOVERSION;  Surgeon: Skeet Latch, MD;  Location: [facility] ENDOSCOPY;  Service: Cardiovascular;  Laterality: N/A;   [place] N/A 08/20/2016   Procedure: Atrial Fibrillation Ablation;  Surgeon: [name], MD;  Location: [facility] CV LAB;  Service: Cardiovascular;  Laterality: N/A;   EYE SURGERY     cataracts   PERICARDIECTOMY     TEE WITHOUT CARDIOVERSION N/A 06/10/2014   Procedure: TRANSESOPHAGEAL ECHOCARDIOGRAM (TEE);  Surgeon: Thayer Headings, MD;  Location: [facility] ENDOSCOPY;   Service: Cardiovascular;  Laterality: N/A;   TONSILLECTOMY      Family History  Problem Relation Age of Onset   Heart disease Mother    Emphysema Father    Breast cancer Sister     Social History:  reports that she has never smoked. She has never used smokeless tobacco. She reports current alcohol use. She reports that she does not use drugs.  Allergies:  Allergies  Allergen Reactions   Allopurinol     More severe gout    Amiodarone Other (See Comments)    Dizziness, halucinations   Corticosteroids Other (See Comments)    Liver scarring as a child   Other Other (See Comments)    Cannot take STEROIDS-unknown reaction-caused by internal scarring on liver.    Anesthesia precaution due to excessive liver scarring    Erythromycin Nausea And Vomiting and Rash    Medications: I have reviewed the patient's current medications.  Results for orders placed or performed during the hospital encounter of 06/20/21 (from the past 48 hour(s))  CBG monitoring, ED     Status: Abnormal   Collection Time: 06/20/21  5:49 PM  Result Value Ref Range   Glucose-Capillary 124 (H) 70 - 99 mg/dL    Comment: Glucose reference range applies only to samples taken after fasting for at least 8 hours.  Glucose, capillary     Status: Abnormal   Collection Time: 06/20/21 11:13 PM  Result Value Ref Range   Glucose-Capillary 143 (H) 70 - 99 mg/dL  Comment: Glucose reference range applies only to samples taken after fasting for at least 8 hours.  Hemoglobin A1c     Status: Abnormal   Collection Time: 06/21/21  2:34 AM  Result Value Ref Range   Hgb A1c MFr Bld 4.6 (L) 4.8 - 5.6 %    Comment: (NOTE) Pre diabetes:          5.7%-6.4%  Diabetes:              >6.4%  Glycemic control for   <7.0% adults with diabetes    Mean Plasma Glucose 85.32 mg/dL    Comment: Performed at [facility] [address], [place], [place] [id number]  Basic metabolic panel     Status: Abnormal   Collection Time:  06/21/21  2:34 AM  Result Value Ref Range   Sodium 134 (L) 135 - 145 mmol/L   Potassium 5.2 (H) 3.5 - 5.1 mmol/L   Chloride 101 98 - 111 mmol/L   CO2 26 22 - 32 mmol/L   Glucose, Bld 122 (H) 70 - 99 mg/dL    Comment: Glucose reference range applies only to samples taken after fasting for at least 8 hours.   BUN 15 8 - 23 mg/dL   Creatinine, Ser 1.09 (H) 0.44 - 1.00 mg/dL   Calcium 8.6 (L) 8.9 - 10.3 mg/dL   GFR, Estimated 52 (L) >60 mL/min    Comment: (NOTE) Calculated using the CKD-EPI Creatinine Equation (2021)    Anion gap 7 5 - 15    Comment: Performed at [facility] [address] [place], [place] [address]  Glucose, capillary     Status: Abnormal   Collection Time: 06/21/21  6:52 AM  Result Value Ref Range   Glucose-Capillary 123 (H) 70 - 99 mg/dL    Comment: Glucose reference range applies only to samples taken after fasting for at least 8 hours.  CBC     Status: Abnormal   Collection Time: 06/21/21  9:41 AM  Result Value Ref Range   WBC 10.8 (H) 4.0 - 10.5 K/uL   RBC 2.59 (L) 3.87 - 5.11 MIL/uL   Hemoglobin 7.7 (L) 12.0 - 15.0 g/dL    Comment: REPEATED TO VERIFY   HCT 22.6 (L) 36.0 - 46.0 %   MCV 87.3 80.0 - 100.0 fL   MCH 29.7 26.0 - 34.0 pg   MCHC 34.1 30.0 - 36.0 g/dL   RDW 13.8 11.5 - 15.5 %   Platelets 217 150 - 400 K/uL   nRBC 0.0 0.0 - 0.2 %    Comment: Performed at [facility], [place] [address] [place], [place] [id number]  Basic metabolic panel     Status: Abnormal   Collection Time: 06/21/21  9:41 AM  Result Value Ref Range   Sodium 133 (L) 135 - 145 mmol/L   Potassium 4.2 3.5 - 5.1 mmol/L   Chloride 100 98 - 111 mmol/L   CO2 25 22 - 32 mmol/L   Glucose, Bld 143 (H) 70 - 99 mg/dL    Comment: Glucose reference range applies only to samples taken after fasting for at least 8 hours.   BUN 14 8 - 23 mg/dL   Creatinine, Ser 1.00 0.44 - 1.00 mg/dL   Calcium 8.6 (L) 8.9 - 10.3 mg/dL   GFR, Estimated 58 (L) >60 mL/min    Comment:  (NOTE) Calculated using the CKD-EPI Creatinine Equation (2021)    Anion gap 8 5 - 15  Comment: Performed at [facility], [place] [address] [place], [place] [address]  ABO/Rh     Status: None   Collection Time: 06/21/21  9:41 AM  Result Value Ref Range   ABO/RH(D)      A POS Performed at [facility] [address] [place], [place] [address]   Type and screen [place]     Status: None (Preliminary result)   Collection Time: 06/21/21 12:15 PM  Result Value Ref Range   ABO/RH(D) A POS    Antibody Screen NEG    Sample Expiration 06/24/2021,2359    Unit Number [id number]    Blood Component Type RED CELLS,LR    Unit division 00    Status of Unit ISSUED,FINAL    Transfusion Status OK TO TRANSFUSE    Crossmatch Result Compatible    Unit Number [id number]    Blood Component Type RED CELLS,LR    Unit division 00    Status of Unit ISSUED    Transfusion Status OK TO TRANSFUSE    Crossmatch Result      Compatible Performed at [facility], [place] [address] [place], [place] [address]   CBC     Status: Abnormal   Collection Time: 06/21/21 12:17 PM  Result Value Ref Range   WBC 9.2 4.0 - 10.5 K/uL   RBC 2.47 (L) 3.87 - 5.11 MIL/uL   Hemoglobin 7.4 (L) 12.0 - 15.0 g/dL   HCT 21.3 (L) 36.0 - 46.0 %   MCV 86.2 80.0 - 100.0 fL   MCH 30.0 26.0 - 34.0 pg   MCHC 34.7 30.0 - 36.0 g/dL   RDW 13.8 11.5 - 15.5 %   Platelets 212 150 - 400 K/uL   nRBC 0.0 0.0 - 0.2 %    Comment: Performed at [facility], [place] [address], [place], [place] [address]  Prepare RBC (crossmatch)     Status: None   Collection Time: 06/21/21 12:17 PM  Result Value Ref Range   Order Confirmation      ORDERS RECEIVED TO CROSSMATCH Performed at [facility] [address], [place], [place] [address]   Glucose, capillary     Status: Abnormal   Collection Time: 06/21/21 12:28 PM  Result Value Ref Range   Glucose-Capillary 121 (H) 70 - 99 mg/dL    Comment:  Glucose reference range applies only to samples taken after fasting for at least 8 hours.  Glucose, capillary     Status: Abnormal   Collection Time: 06/21/21  4:16 PM  Result Value Ref Range   Glucose-Capillary 115 (H) 70 - 99 mg/dL    Comment: Glucose reference range applies only to samples taken after fasting for at least 8 hours.  Basic metabolic panel     Status: Abnormal   Collection Time: 06/22/21  3:32 AM  Result Value Ref Range   Sodium 135 135 - 145 mmol/L   Potassium 4.3 3.5 - 5.1 mmol/L   Chloride 104 98 - 111 mmol/L   CO2 26 22 - 32 mmol/L   Glucose, Bld 98 70 - 99 mg/dL    Comment: Glucose reference range applies only to samples taken after fasting for at least 8 hours.   BUN 11 8 - 23 mg/dL   Creatinine, Ser 0.73 0.44 - 1.00 mg/dL   Calcium 8.5 (L) 8.9 - 10.3 mg/dL   GFR, Estimated >60 >60 mL/min    Comment: (NOTE) Calculated using the CKD-EPI Creatinine  Equation (2021)    Anion gap 5 5 - 15    Comment: Performed at [facility], [place] [address] [place], [place] [address]  CBC     Status: Abnormal   Collection Time: 06/22/21  3:32 AM  Result Value Ref Range   WBC 5.7 4.0 - 10.5 K/uL   RBC 2.48 (L) 3.87 - 5.11 MIL/uL   Hemoglobin 7.4 (L) 12.0 - 15.0 g/dL   HCT 21.3 (L) 36.0 - 46.0 %   MCV 85.9 80.0 - 100.0 fL   MCH 29.8 26.0 - 34.0 pg   MCHC 34.7 30.0 - 36.0 g/dL   RDW 14.3 11.5 - 15.5 %   Platelets 147 (L) 150 - 400 K/uL   nRBC 0.0 0.0 - 0.2 %    Comment: Performed at [facility], [place] [address], [place], [place] [address]  Glucose, capillary     Status: None   Collection Time: 06/22/21  6:40 AM  Result Value Ref Range   Glucose-Capillary 93 70 - 99 mg/dL    Comment: Glucose reference range applies only to samples taken after fasting for at least 8 hours.  Prepare RBC (crossmatch)     Status: None   Collection Time: 06/22/21  7:35 AM  Result Value Ref Range   Order Confirmation      ORDER PROCESSED BY BLOOD BANK Performed at [facility], [place] [address] [place], [place] [address]     No results found.  Review of Systems  HENT:  Negative for ear discharge, ear pain, hearing loss and tinnitus.   Eyes:  Negative for photophobia and pain.  Respiratory:  Negative for cough and shortness of breath.   Cardiovascular:  Negative for chest pain.  Gastrointestinal:  Negative for abdominal pain, nausea and vomiting.  Genitourinary:  Negative for dysuria, flank pain, frequency and urgency.  Musculoskeletal:  Positive for arthralgias (Left hip). Negative for back pain, myalgias and neck pain.  Neurological:  Positive for light-headedness. Negative for dizziness and headaches.  Hematological:  Does not bruise/bleed easily.  Psychiatric/Behavioral:  The patient is not nervous/anxious.   Blood pressure (!) 111/46, pulse 78, temperature 97.8 F (36.6 C), temperature source Oral, resp. rate 17, height 5' 2.25" (1.581 m), weight 68.5 kg, SpO2 96 %. Physical Exam Constitutional:      General: She is not in acute distress.    Appearance: She is well-developed. She is not diaphoretic.  HENT:     Head: Normocephalic and atraumatic.  Eyes:     General: No scleral icterus.       Right eye: No discharge.        Left eye: No discharge.     Conjunctiva/sclera: Conjunctivae normal.  Cardiovascular:     Rate and Rhythm: Normal rate and regular rhythm.  Pulmonary:     Effort: Pulmonary effort is normal. No respiratory distress.  Musculoskeletal:     Cervical back: Normal range of motion.     Comments: LLE No traumatic wounds or rash, large ecchymosis left posterior hip but soft and only mildly indurated  Mod TTP  No knee or ankle effusion  Knee stable to varus/ valgus and anterior/posterior stress  Sens DPN, SPN, TN intact  Motor EHL, ext, flex, evers 5/5  DP 1+, PT 0, No significant edema  Skin:    General: Skin is warm and dry.  Neurological:     Mental Status: She is alert.  Psychiatric:  Mood and Affect: Mood  normal.        Behavior: Behavior normal.    Assessment/Plan: Left gluteal hematoma -- Suggested she wear compression shorts (like bicycle shorts) but will have to have family member provide. I am encouraged by the appearance and feel of the hematoma and am somewhat optimistic she may not develop skin issues. Otherwise would not recommend any intervention. She may f/u with Dr. Percell Miller in a couple of weeks but mentioned she may want to return to [facility] as she has a history there and that is fine as well.    Lisette Abu, PA-C Orthopedic Surgery [phone] 06/22/2021, 11:39 AM

## 2021-06-22 NOTE — Progress Notes (Addendum)
PROGRESS NOTE    Yolanda Bell  [id number] DOB: [birth year] DOA: 06/20/2021 PCP: Hulan Fess, MD   Brief Narrative: 80 year old with past medical history significant for PAF on Xarelto, paroxysmal SVT, constrictive pericarditis,  liver disease, questionable cirrhosis, who presented after 2 syncope episode.  She had her COVID posterior on Wednesday.  Since then she had severe headache.  On EMS arrival to her house her blood pressure was in the 80s.  Evaluation in the ED: CT head negative for acute finding.  CT abdomen and pelvis showed large hematoma to left gluteal area.   Assessment & Plan:   Active Problems:   Gout   PAF (paroxysmal atrial fibrillation) (HCC)   Syncope and collapse   Near syncope   Acute blood loss anemia  1-Syncope: -Evaluated by cardiology, Vaso-vagal , component of poor oral intake, sickness from covid booster.  -Continue with IV fluids.  -Worsening orthostatic  due to acute blood loss anemia secondary to gluteal hematoma.  -Plan to hold diuretics. (torsemide and spironolactone).   2-Acute blood loss anemia, large left gluteal hematoma; Has decreased to 7.4 from 11. Hold torsemide and spironolactone. Plan to continue to  hold Xarelto. Received one unit PRBC 9/11. Will proceed with another unit PRBC.  Ortho consulted.  Check CBC every 8 hours.  Check INR   3-Orthostatic Hypotension; worsen today by diuretics and blood loss anemia.  Continue with IV fluids.  Blood transfusion.   4-A fib;  Continue with metoprolol ( but once a day)  holder parameter for hypotension.  Holding xarelto due to gluteal hematoma acute blood loss anemia.  Cardiology consulted.   Hypokalemia; replaced.  Hyperkalemia; lab error, repeated 4.2.  Hyperglycemia; transient suspect related to stress.  A1c 4.6.   Chronic dysphonia.  H/O of constrictive pericarditis S/P pericardial stripping at [facility] . ECHO ordered.   Estimated body mass index is 27.4 kg/m as  calculated from the following:   Height as of this encounter: 5' 2.25" (1.581 m).   Weight as of this encounter: 68.5 kg.   DVT prophylaxis: SCD Code Status: Full Code Family Communication: son who was at bedside 9/11. Disposition Plan:  Status is: Observation  The patient will require care spanning > 2 midnights and should be moved to inpatient because: IV treatments appropriate due to intensity of illness or inability to take PO  Dispo: The patient is from: Home              Anticipated d/c is to: Home              Patient currently is not medically stable to d/c.   Difficult to place patient No        Consultants:  Cardiology   Procedures:  ECHO  Antimicrobials:    Subjective: She is alert, she doesn't know if she is dizzy,  has not  been out of bed.    Objective: Vitals:   06/22/21 1020 06/22/21 1041 06/22/21 1223 06/22/21 1242  BP: (!) 107/52 (!) 111/46 (!) 109/54 97/85  Pulse: 87 78 (!) 55 76  Resp: '13 17 17 19  '$ Temp: 98 F (36.7 C) 97.8 F (36.6 C) 97.7 F (36.5 C) 97.8 F (36.6 C)  TempSrc: Oral Oral Oral Oral  SpO2: 94% 96% 97% 100%  Weight:      Height:        Intake/Output Summary (Last 24 hours) at 06/22/2021 1512 Last data filed at 06/22/2021 1230 Gross per 24 hour  Intake 1620.44 ml  Output 2350 ml  Net -729.56 ml    Filed Weights   06/20/21 1045 06/21/21 0500  Weight: 65 kg 68.5 kg    Examination:  General exam: NAD Respiratory system: CTA Cardiovascular system: S 1, S 2 RRR Gastrointestinal system: BS present, soft, nt, ND Central nervous system: Alert, oriented.  Extremities: large area of ecchymosis left gluteus.    Data Reviewed: I have personally reviewed following labs and imaging studies  CBC: Recent Labs  Lab 06/20/21 1033 06/20/21 1040 06/21/21 0941 06/21/21 1217 06/22/21 0332  WBC 9.6  --  10.8* 9.2 5.7  HGB 11.7* 11.6* 7.7* 7.4* 7.4*  HCT 35.8* 34.0* 22.6* 21.3* 21.3*  MCV 90.2  --  87.3 86.2 85.9  PLT  230  --  217 212 147*    Basic Metabolic Panel: Recent Labs  Lab 06/20/21 1033 06/20/21 1040 06/21/21 0234 06/21/21 0941 06/22/21 0332  NA 138 137 134* 133* 135  K 3.5 3.5 5.2* 4.2 4.3  CL 102 101 101 100 104  CO2 21*  --  '26 25 26  '$ GLUCOSE 194* 201* 122* 143* 98  BUN '16 14 15 14 11  '$ CREATININE 1.14* 1.10* 1.09* 1.00 0.73  CALCIUM 8.9  --  8.6* 8.6* 8.5*    GFR: Estimated Creatinine Clearance: 52.9 mL/min (by C-G formula based on SCr of 0.73 mg/dL). Liver Function Tests: Recent Labs  Lab 06/20/21 1033  AST 23  ALT 15  ALKPHOS 47  BILITOT 1.1  PROT 6.3*  ALBUMIN 3.8    No results for input(s): LIPASE, AMYLASE in the last 168 hours. No results for input(s): AMMONIA in the last 168 hours. Coagulation Profile: Recent Labs  Lab 06/20/21 1033  INR 2.6*    Cardiac Enzymes: No results for input(s): CKTOTAL, CKMB, CKMBINDEX, TROPONINI in the last 168 hours. BNP (last 3 results) No results for input(s): PROBNP in the last 8760 hours. HbA1C: Recent Labs    06/21/21 0234  HGBA1C 4.6*    CBG: Recent Labs  Lab 06/21/21 0652 06/21/21 1228 06/21/21 1616 06/22/21 0640 06/22/21 1221  GLUCAP 123* 121* 115* 93 91    Lipid Profile: No results for input(s): CHOL, HDL, LDLCALC, TRIG, CHOLHDL, LDLDIRECT in the last 72 hours. Thyroid Function Tests: No results for input(s): TSH, T4TOTAL, FREET4, T3FREE, THYROIDAB in the last 72 hours. Anemia Panel: No results for input(s): VITAMINB12, FOLATE, FERRITIN, TIBC, IRON, RETICCTPCT in the last 72 hours. Sepsis Labs: No results for input(s): PROCALCITON, LATICACIDVEN in the last 168 hours.  Recent Results (from the past 240 hour(s))  Resp Panel by RT-PCR (Flu A&B, Covid) Nasopharyngeal Swab     Status: None   Collection Time: 06/20/21 10:33 AM   Specimen: Nasopharyngeal Swab; Nasopharyngeal(NP) swabs in vial transport medium  Result Value Ref Range Status   SARS Coronavirus 2 by RT PCR NEGATIVE NEGATIVE Final     Comment: (NOTE) SARS-CoV-2 target nucleic acids are NOT DETECTED.  The SARS-CoV-2 RNA is generally detectable in upper respiratory specimens during the acute phase of infection. The lowest concentration of SARS-CoV-2 viral copies this assay can detect is 138 copies/mL. A negative result does not preclude SARS-Cov-2 infection and should not be used as the sole basis for treatment or other patient management decisions. A negative result may occur with  improper specimen collection/handling, submission of specimen other than nasopharyngeal swab, presence of viral mutation(s) within the areas targeted by this assay, and inadequate number of viral copies(<138 copies/mL). A negative result must be combined with clinical observations,  patient history, and epidemiological information. The expected result is Negative.  Fact Sheet for Patients:  EntrepreneurPulse.com.au  Fact Sheet for Healthcare Providers:  IncredibleEmployment.be  This test is no t yet approved or cleared by the [place] FDA and  has been authorized for detection and/or diagnosis of SARS-CoV-2 by FDA under an Emergency Use Authorization (EUA). This EUA will remain  in effect (meaning this test can be used) for the duration of the COVID-19 declaration under Section 564(b)(1) of the Act, 21 U.S.C.section 360bbb-3(b)(1), unless the authorization is terminated  or revoked sooner.       Influenza A by PCR NEGATIVE NEGATIVE Final   Influenza B by PCR NEGATIVE NEGATIVE Final    Comment: (NOTE) The Xpert Xpress SARS-CoV-2/FLU/RSV plus assay is intended as an aid in the diagnosis of influenza from Nasopharyngeal swab specimens and should not be used as a sole basis for treatment. Nasal washings and aspirates are unacceptable for Xpert Xpress SARS-CoV-2/FLU/RSV testing.  Fact Sheet for Patients: EntrepreneurPulse.com.au  Fact Sheet for Healthcare  Providers: IncredibleEmployment.be  This test is not yet approved or cleared by the [place] FDA and has been authorized for detection and/or diagnosis of SARS-CoV-2 by FDA under an Emergency Use Authorization (EUA). This EUA will remain in effect (meaning this test can be used) for the duration of the COVID-19 declaration under Section 564(b)(1) of the Act, 21 U.S.C. section 360bbb-3(b)(1), unless the authorization is terminated or revoked.  Performed at [facility], [place] [address], [place], [place] [address]           Radiology Studies: No results found.      Scheduled Meds:  sodium chloride   Intravenous Once   sodium chloride   Intravenous Once   cholecalciferol  1,000 Units Oral Daily   insulin aspart  0-9 Units Subcutaneous TID WC   melatonin  3 mg Oral QHS   metoprolol succinate  25 mg Oral Daily   sodium chloride flush  3 mL Intravenous Q12H   Continuous Infusions:  sodium chloride 75 mL/hr at 06/21/21 1918     LOS: 1 day    Time spent: 35 minutes.     Elmarie Shiley, MD [facility]   If 7PM-7AM, please contact night-coverage [email or web address]  06/22/2021, 3:12 PM

## 2021-06-23 ENCOUNTER — Inpatient Hospital Stay (HOSPITAL_COMMUNITY): Payer: Medicare PPO

## 2021-06-23 DIAGNOSIS — I480 Paroxysmal atrial fibrillation: Secondary | ICD-10-CM

## 2021-06-23 DIAGNOSIS — R55 Syncope and collapse: Secondary | ICD-10-CM

## 2021-06-23 LAB — CBC
HCT: 24.3 % — ABNORMAL LOW (ref 36.0–46.0)
HCT: 26.8 % — ABNORMAL LOW (ref 36.0–46.0)
Hemoglobin: 8.4 g/dL — ABNORMAL LOW (ref 12.0–15.0)
Hemoglobin: 9.2 g/dL — ABNORMAL LOW (ref 12.0–15.0)
MCH: 30.3 pg (ref 26.0–34.0)
MCH: 30.4 pg (ref 26.0–34.0)
MCHC: 34.6 g/dL (ref 30.0–36.0)
MCHC: 34.3 g/dL (ref 30.0–36.0)
MCV: 87.7 fL (ref 80.0–100.0)
MCV: 88.4 fL (ref 80.0–100.0)
Platelets: 142 10*3/uL — ABNORMAL LOW (ref 150–400)
Platelets: 168 10*3/uL (ref 150–400)
RBC: 2.77 MIL/uL — ABNORMAL LOW (ref 3.87–5.11)
RBC: 3.03 MIL/uL — ABNORMAL LOW (ref 3.87–5.11)
RDW: 14.5 % (ref 11.5–15.5)
RDW: 14.6 % (ref 11.5–15.5)
WBC: 5.9 10*3/uL (ref 4.0–10.5)
WBC: 6 10*3/uL (ref 4.0–10.5)
nRBC: 0 % (ref 0.0–0.2)
nRBC: 0 % (ref 0.0–0.2)

## 2021-06-23 LAB — ECHOCARDIOGRAM COMPLETE
AR max vel: 1.6 cm2
AV Area VTI: 1.77 cm2
AV Area mean vel: 1.56 cm2
AV Mean grad: 5 mmHg
AV Peak grad: 9 mmHg
Ao pk vel: 1.5 m/s
Area-P 1/2: 2.16 cm2
Height: 62.25 in
S' Lateral: 3.3 cm
Weight: 2416.24 oz

## 2021-06-23 LAB — GLUCOSE, CAPILLARY
Glucose-Capillary: 100 mg/dL — ABNORMAL HIGH (ref 70–99)
Glucose-Capillary: 92 mg/dL (ref 70–99)
Glucose-Capillary: 119 mg/dL — ABNORMAL HIGH (ref 70–99)

## 2021-06-23 LAB — TYPE AND SCREEN
ABO/RH(D): A POS
Antibody Screen: NEGATIVE
Unit division: 0
Unit division: 0

## 2021-06-23 LAB — BPAM RBC
Blood Product Expiration Date: 202210052359
Blood Product Expiration Date: 202210082359
ISSUE DATE / TIME: 202209111458
ISSUE DATE / TIME: 202209121013
Unit Type and Rh: 6200
Unit Type and Rh: 6200

## 2021-06-23 MED ORDER — METOPROLOL SUCCINATE ER 25 MG PO TB24: 25.0000 mg | ORAL_TABLET | Freq: Every day | ORAL | 0 refills | Status: DC

## 2021-06-23 MED ORDER — TORSEMIDE 20 MG PO TABS: 20.0000 mg | ORAL_TABLET | Freq: Every day | ORAL | 3 refills | Status: DC | PRN

## 2021-06-23 MED ORDER — POTASSIUM CHLORIDE CRYS ER 10 MEQ PO TBCR: 10.0000 meq | EXTENDED_RELEASE_TABLET | Freq: Every day | ORAL | 3 refills | Status: DC | PRN

## 2021-06-23 NOTE — Progress Notes (Signed)
Cardiology Progress Note  Patient ID: Yolanda Bell MRN: [id number] DOB: 1941-08-12 Date of Encounter: 06/23/2021  Primary Cardiologist: Mertie Moores, MD  Subjective   Chief Complaint: Palpitations  HPI: Reports heart racing overnight.  She reports this was due to poor sleep due to the patient in the next room beside her who was screaming all night.  Hemoglobin appears to be stable.  ROS:  All other ROS reviewed and negative. Pertinent positives noted in the HPI.     Inpatient Medications  Scheduled Meds:  sodium chloride   Intravenous Once   sodium chloride   Intravenous Once   cholecalciferol  1,000 Units Oral Daily   insulin aspart  0-9 Units Subcutaneous TID WC   melatonin  3 mg Oral QHS   metoprolol succinate  25 mg Oral Daily   sodium chloride flush  3 mL Intravenous Q12H   Continuous Infusions:  sodium chloride 75 mL/hr at 06/23/21 0632   PRN Meds: acetaminophen **OR** acetaminophen, acetaminophen, ondansetron **OR** ondansetron (ZOFRAN) IV   Vital Signs   Vitals:   06/22/21 2022 06/22/21 2335 06/23/21 0431 06/23/21 0700  BP: (!) 115/43 (!) 109/52 126/79 (!) 109/48  Pulse: 87 64 80 66  Resp: 18 20 (!) 21 18  Temp: 97.8 F (36.6 C) 99.2 F (37.3 C) 98.4 F (36.9 C) 98.1 F (36.7 C)  TempSrc: Oral Oral Oral Oral  SpO2: 99% 96% 100% 96%  Weight:      Height:        Intake/Output Summary (Last 24 hours) at 06/23/2021 1046 Last data filed at 06/23/2021 0913 Gross per 24 hour  Intake 780.5 ml  Output 800 ml  Net -19.5 ml   Last 3 Weights 06/21/2021 06/20/2021 12/15/2020  Weight (lbs) 151 lb 0.2 oz 143 lb 4.8 oz 150 lb 12.8 oz  Weight (kg) 68.5 kg 65 kg 68.402 kg      Telemetry  Overnight telemetry shows sinus rhythm in the 70s, brief ectopic atrial tachycardia noted.this does not appear to be A. fib.  Which I personally reviewed.    Physical Exam   Vitals:   06/22/21 2022 06/22/21 2335 06/23/21 0431 06/23/21 0700  BP: (!) 115/43 (!) 109/52 126/79 (!)  109/48  Pulse: 87 64 80 66  Resp: 18 20 (!) 21 18  Temp: 97.8 F (36.6 C) 99.2 F (37.3 C) 98.4 F (36.9 C) 98.1 F (36.7 C)  TempSrc: Oral Oral Oral Oral  SpO2: 99% 96% 100% 96%  Weight:      Height:        Intake/Output Summary (Last 24 hours) at 06/23/2021 1046 Last data filed at 06/23/2021 0913 Gross per 24 hour  Intake 780.5 ml  Output 800 ml  Net -19.5 ml    Last 3 Weights 06/21/2021 06/20/2021 12/15/2020  Weight (lbs) 151 lb 0.2 oz 143 lb 4.8 oz 150 lb 12.8 oz  Weight (kg) 68.5 kg 65 kg 68.402 kg    Body mass index is 27.4 kg/m.  General: Well nourished, well developed, in no acute distress Head: Atraumatic, normal size  Eyes: PEERLA, EOMI  Neck: Supple, no JVD Endocrine: No thryomegaly Cardiac: Normal S1, S2; RRR; no murmurs, rubs, or gallops Lungs: Clear to auscultation bilaterally, no wheezing, rhonchi or rales  Abd: Soft, nontender, no hepatomegaly  Ext: No edema, pulses 2+, extensive left thigh and gluteal hematoma Musculoskeletal: No deformities, BUE and BLE strength normal and equal Skin: Warm and dry, no rashes   Neuro: Alert and oriented to person, place,  time, and situation, CNII-XII grossly intact, no focal deficits  Psych: Normal mood and affect   Labs  High Sensitivity Troponin:   Recent Labs  Lab 06/20/21 1033  TROPONINIHS 4     Cardiac EnzymesNo results for input(s): TROPONINI in the last 168 hours. No results for input(s): TROPIPOC in the last 168 hours.  Chemistry Recent Labs  Lab 06/20/21 1033 06/20/21 1040 06/21/21 0234 06/21/21 0941 06/22/21 0332  NA 138   < > 134* 133* 135  K 3.5   < > 5.2* 4.2 4.3  CL 102   < > 101 100 104  CO2 21*  --  '26 25 26  '$ GLUCOSE 194*   < > 122* 143* 98  BUN 16   < > '15 14 11  '$ CREATININE 1.14*   < > 1.09* 1.00 0.73  CALCIUM 8.9  --  8.6* 8.6* 8.5*  PROT 6.3*  --   --   --   --   ALBUMIN 3.8  --   --   --   --   AST 23  --   --   --   --   ALT 15  --   --   --   --   ALKPHOS 47  --   --   --   --    BILITOT 1.1  --   --   --   --   GFRNONAA 49*  --  52* 58* >60  ANIONGAP 15  --  '7 8 5   '$ < > = values in this interval not displayed.    Hematology Recent Labs  Lab 06/22/21 1700 06/22/21 1931 06/23/21 0412  WBC 6.0 5.6 5.9  RBC 3.03* 2.80* 2.77*  HGB 9.2* 8.3* 8.4*  HCT 27.2* 24.6* 24.3*  MCV 89.8 87.9 87.7  MCH 30.4 29.6 30.3  MCHC 33.8 33.7 34.6  RDW 14.6 14.4 14.5  PLT 163 141* 142*   BNP Recent Labs  Lab 06/20/21 1034  BNP 151.3*    DDimer No results for input(s): DDIMER in the last 168 hours.   Radiology  No results found.   Patient Profile  Yolanda Bell is a 80 y.o. female with constrictive pericarditis, atrial fibrillation/flutter s/p ablation, SVT, HTN who was admitted 06/20/2021 for syncope.  Course complicated by left gluteal hematoma.  Assessment & Plan   #Syncope, vasovagal versus orthostatic -History consistent with orthostasis versus vasovagal event. -Suspect she is on too much diuretic as well. -Would recommend to continue her Aldactone at discharge.  She can take her diuretic as needed.  She reports a history of cirrhosis and is worried about this. -No murmurs.  No red flag symptoms.  Echocardiogram will be performed today but I suspect this will be normal.  No further work-up indicated.  #Atrial fibrillation/flutter status post ablation -Monitor shows likely brief atrial tachycardia episodes.  Maintaining sinus rhythm. -Continue metoprolol succinate 25 mg daily. -Regarding her Xarelto given her fall and significant drop in hemoglobin would recommend to hold Xarelto until she discusses this with her outpatient cardiologist.  I think she likely would be a good candidate for the watchman procedure.  She can discuss this further in the outpatient setting.  #Left buttock hematoma -Holding Xarelto until outpatient discussion.  [facility] will sign off.   Medication Recommendations: Medications as above Other recommendations (labs, testing,  etc): None. Follow up as an outpatient: We will arrange hospital follow-up with Dr. Acie Fredrickson in 4 to 6 weeks.  For questions or  updates, please contact [place] Please consult [email or web address] for contact info under   Time Spent with Patient: I have spent a total of 25 minutes with patient reviewing hospital notes, telemetry, EKGs, labs and examining the patient as well as establishing an assessment and plan that was discussed with the patient.  > 50% of time was spent in direct patient care.    Signed, Addison Naegeli. Audie Box, MD, [place]  06/23/2021 10:46 AM

## 2021-06-23 NOTE — Discharge Summary (Signed)
Physician Discharge Summary  Yolanda Bell [id number] DOB: September 02, 1941 DOA: 06/20/2021  PCP: Hulan Fess, MD  Admit date: 06/20/2021 Discharge date: 06/23/2021  Admitted From: Home  Disposition:  Home   Recommendations for Outpatient Follow-up:  Follow up with PCP in 1-2 weeks Please obtain BMP/CBC in one week Follow up with cardiology for resumption of Xarelto.  Follow up with hepatology for management of diuretics.  Follow up with ortho in 3 weeks.   Home Health: yes. She will benefit from PT at home for safety evaluation.   Discharge Condition: Stable.  CODE STATUS: Full Code Diet recommendation: Heart Healthy   Brief/Interim Summary: 80 year old with past medical history significant for PAF on Xarelto, paroxysmal SVT, constrictive pericarditis,  liver disease, questionable cirrhosis, who presented after 2 syncope episode.  She had her COVID posterior on Wednesday.  Since then she had severe headache.  On EMS arrival to her house her blood pressure was in the 80s.   Evaluation in the ED: CT head negative for acute finding.  CT abdomen and pelvis showed large hematoma to left gluteal area.    1-Syncope: -Evaluated by cardiology, Vaso-vagal , component of poor oral intake, sickness from covid booster.  -Continue with IV fluids.  -Worsening orthostatic  due to acute blood loss anemia secondary to gluteal hematoma.  - torsemide and spironolactone held during this admission. Resume spironolactone at discharge, starting tomorrow. Resume torsemide PRN for increase weight/fluids.   -Awaiting ECHO to discharge patient today/   2-Acute blood loss anemia, large left gluteal hematoma; Has decreased to 7.4 from 11. Hold torsemide and spironolactone during admission. Plan to resume spironolactone at discharge.  Plan to continue to  hold Xarelto. Received total 2 units PRBC.  Ortho consulted. Recommend support care, compression shorts, follow up with ortho in 2 weeks,.  Hb stable. At  9 INR normal.    3-Orthostatic Hypotension; worsen today by diuretics and blood loss anemia.  Continue with IV fluids.  Received 2 units PRBC.  Hb increase to 9.  Orthostatic hypotension resolved.    4-A fib;  Continue with metoprolol ( but once a day)  holder parameter for hypotension.  Holding xarelto due to gluteal hematoma acute blood loss anemia.  Cardiology consulted. Plan to hold xarelto at discharge. Needs to follow up with cardiology to discussed resumption anticoagulation and watchman procedure/    Hypokalemia; replaced.  Hyperkalemia; lab error, repeated 4.2.   Hyperglycemia; transient suspect related to stress.  A1c 4.6.    H/O cirrhosis on torsemide and spironolactone. Needs to follow up with hepatology.   Chronic dysphonia.  H/O of constrictive pericarditis S/P pericardial stripping at [facility] clinic . ECHO ordered. awaiting result to discharge patient today.       Discharge Diagnoses:  Active Problems:   Gout   PAF (paroxysmal atrial fibrillation) (HCC)   Syncope and collapse   Near syncope   Acute blood loss anemia    Discharge Instructions  Discharge Instructions     Diet - low sodium heart healthy   Complete by: As directed    Increase activity slowly   Complete by: As directed       Allergies as of 06/23/2021       Reactions   Allopurinol    More severe gout    Amiodarone Other (See Comments)   Dizziness, halucinations   Corticosteroids Other (See Comments)   Liver scarring as a child   Other Other (See Comments)   Cannot take STEROIDS-unknown reaction-caused by internal scarring  on liver.    Anesthesia precaution due to excessive liver scarring    Erythromycin Nausea And Vomiting, Rash        Medication List     STOP taking these medications    Xarelto 20 MG Tabs tablet Generic drug: rivaroxaban       TAKE these medications    acetaminophen 500 MG tablet Commonly known as: TYLENOL Take 1,000 mg by mouth every 6 (six)  hours as needed for mild pain.   Alive Calcium Plus Vitamin D3 260-25 MG-MCG Chew Generic drug: Ca Phosphate-Cholecalciferol Chew 1 tablet by mouth daily.   denosumab 60 MG/ML Sosy injection Commonly known as: PROLIA Inject 60 mg into the skin every 6 (six) months. Last dose End of may early april   Denta 5000 Plus 1.1 % Crea dental cream Generic drug: sodium fluoride Place 1 application onto teeth See admin instructions.   glucosamine-chondroitin 500-400 MG tablet Take 1 tablet by mouth daily.   indomethacin 25 MG capsule Commonly known as: INDOCIN Take 25 mg by mouth as needed (gout). For gout   metoprolol succinate 25 MG 24 hr tablet Commonly known as: TOPROL-XL Take 1 tablet (25 mg total) by mouth daily. Start taking on: June 24, 2021 What changed: when to take this   potassium chloride 10 MEQ tablet Commonly known as: Klor-Con M10 Take 1 tablet (10 mEq total) by mouth daily as needed (take with torsemide). What changed:  when to take this reasons to take this   spironolactone 50 MG tablet Commonly known as: ALDACTONE Take 1 tablet (50 mg total) by mouth daily.   torsemide 20 MG tablet Commonly known as: DEMADEX Take 1 tablet (20 mg total) by mouth daily as needed (Take if your weight increases more than 2 pounds in 24 hours.). What changed:  when to take this reasons to take this   Vitamin C 500 MG Caps Take 1 tablet by mouth daily.   Vitamin D 125 MCG (5000 UT) Caps Take 1 tablet by mouth daily.        Follow-up Information     Imogene Burn, PA-C Follow up on 07/15/2021.   Specialty: Cardiology Why: at 8:15AM for your post hospital cardiology follow up appointment Contact information: [address] [place] [address] [phone]                Allergies  Allergen Reactions   Allopurinol     More severe gout    Amiodarone Other (See Comments)    Dizziness, halucinations   Corticosteroids Other (See  Comments)    Liver scarring as a child   Other Other (See Comments)    Cannot take STEROIDS-unknown reaction-caused by internal scarring on liver.    Anesthesia precaution due to excessive liver scarring    Erythromycin Nausea And Vomiting and Rash    Consultations: Cardiology Ortho   Procedures/Studies: CT HEAD WO CONTRAST  Result Date: 06/20/2021 CLINICAL DATA:  [place] this morning.  On anticoagulants. EXAM: CT HEAD WITHOUT CONTRAST CT CERVICAL SPINE WITHOUT CONTRAST TECHNIQUE: Multidetector CT imaging of the head and cervical spine was performed following the standard protocol without intravenous contrast. Multiplanar CT image reconstructions of the cervical spine were also generated. COMPARISON:  None. FINDINGS: CT HEAD FINDINGS Brain: Mildly enlarged ventricles and cortical sulci. No intracranial hemorrhage, mass lesion or CT evidence of acute infarction. Vascular: No hyperdense vessel or unexpected calcification. Skull: Normal. Negative for fracture or focal lesion. Sinuses/Orbits: Status post bilateral cataract extraction.  Unremarkable bones and included paranasal sinuses. Other: None. CT CERVICAL SPINE FINDINGS Alignment: Mild reversal of the normal cervical lordosis. Mild anterolisthesis at the C3-4 and C6-7 levels. Skull base and vertebrae: No acute fracture. No primary bone lesion or focal pathologic process. Soft tissues and spinal canal: No prevertebral fluid or swelling. No visible canal hematoma. Disc levels:  Multilevel degenerative changes. Upper chest: Clear lung apices. Other: None. IMPRESSION: 1. No skull fracture or intracranial hemorrhage. 2. No cervical spine fracture or traumatic subluxation. 3. Mild diffuse cerebral and cerebellar atrophy. 4. Multilevel cervical and upper thoracic spine degenerative changes. Electronically Signed   By: Claudie Revering M.D.   On: 06/20/2021 11:36   CT CERVICAL SPINE WO CONTRAST  Result Date: 06/20/2021 CLINICAL DATA:  [place] this morning.  On  anticoagulants. EXAM: CT HEAD WITHOUT CONTRAST CT CERVICAL SPINE WITHOUT CONTRAST TECHNIQUE: Multidetector CT imaging of the head and cervical spine was performed following the standard protocol without intravenous contrast. Multiplanar CT image reconstructions of the cervical spine were also generated. COMPARISON:  None. FINDINGS: CT HEAD FINDINGS Brain: Mildly enlarged ventricles and cortical sulci. No intracranial hemorrhage, mass lesion or CT evidence of acute infarction. Vascular: No hyperdense vessel or unexpected calcification. Skull: Normal. Negative for fracture or focal lesion. Sinuses/Orbits: Status post bilateral cataract extraction. Unremarkable bones and included paranasal sinuses. Other: None. CT CERVICAL SPINE FINDINGS Alignment: Mild reversal of the normal cervical lordosis. Mild anterolisthesis at the C3-4 and C6-7 levels. Skull base and vertebrae: No acute fracture. No primary bone lesion or focal pathologic process. Soft tissues and spinal canal: No prevertebral fluid or swelling. No visible canal hematoma. Disc levels:  Multilevel degenerative changes. Upper chest: Clear lung apices. Other: None. IMPRESSION: 1. No skull fracture or intracranial hemorrhage. 2. No cervical spine fracture or traumatic subluxation. 3. Mild diffuse cerebral and cerebellar atrophy. 4. Multilevel cervical and upper thoracic spine degenerative changes. Electronically Signed   By: Claudie Revering M.D.   On: 06/20/2021 11:36   CT ABDOMEN PELVIS W CONTRAST  Result Date: 06/20/2021 CLINICAL DATA:  [place] this morning.  On anticoagulants. EXAM: CT ABDOMEN AND PELVIS WITH CONTRAST TECHNIQUE: Multidetector CT imaging of the abdomen and pelvis was performed using the standard protocol following bolus administration of intravenous contrast. CONTRAST:  61m OMNIPAQUE IOHEXOL 300 MG/ML  SOLN COMPARISON:  12/25/2004. FINDINGS: Lower chest: Interval 7 mm noncalcified nodule in the medial left lower lobe on image number 29/5. This  measured 8 mm in maximum diameter previously in 2006. No new nodules. Atheromatous calcifications, including the coronary arteries and aorta. Hepatobiliary: Diffuse low density of the liver relative to the spleen. Cholecystectomy clips. Pancreas: Stable mildly dilated pancreatic duct with a maximum diameter of 3 mm. No pancreatic mass visualized. Spleen: Normal in size without focal abnormality. Adrenals/Urinary Tract: Normal appearing adrenal glands. Stable small left renal cysts, including an exophytic hemorrhagic or proteinaceous cyst. Small right kidney. Unremarkable ureters and urinary bladder. Stomach/Bowel: Large number of sigmoid and descending colon diverticula without evidence of diverticulitis. Normal appearing appendix. Unremarkable stomach and small bowel. Vascular/Lymphatic: Atheromatous arterial calcifications without aneurysm. No enlarged lymph nodes. Reproductive: Status post hysterectomy. No adnexal masses. Other: Large hematoma extensive edema in the subcutaneous fat of the left buttocks. The hematoma measures 14.2 x 6.1 cm on image number 72/3. Musculoskeletal: No acute fractures, dislocations or subluxations seen. There is an approximately 80% compression deformity of the L5 vertebra with 6 mm of retropulsion combining with bilateral facet and ligamentum flavum hypertrophy to produce marked canal  stenosis at the L4-5 level with mild bilateral foraminal stenosis. Lumbar and lower thoracic spine degenerative changes. IMPRESSION: 1. Large hematoma and associated subcutaneous edema in the subcutaneous fat of the left buttocks. 2. No other acute abnormalities. 3. Diffuse hepatic steatosis. 4. No significant change in a 7 mm noncalcified nodule in the left lower lobe. The long-term stability is compatible with a benign process and this does not need follow-up. 5. Marked sigmoid and descending colon diverticulosis. 6. Old L5 vertebral compression fracture with retropulsion and marked canal stenosis at  the L4-5 level. Electronically Signed   By: Claudie Revering M.D.   On: 06/20/2021 11:48   DG Pelvis Portable  Result Date: 06/20/2021 CLINICAL DATA:  Status post fall, the patient is on blood thinners. EXAM: PORTABLE PELVIS 1-2 VIEWS COMPARISON:  None. FINDINGS: There is no evidence of pelvic fracture or dislocation. Degenerative joint changes of the lower lumbar spine noted. IMPRESSION: No acute fracture or dislocation. Electronically Signed   By: Abelardo Diesel M.D.   On: 06/20/2021 10:57   DG Chest Port 1 View  Result Date: 06/20/2021 CLINICAL DATA:  Status post fall.  Patient is on blood thinners. EXAM: PORTABLE CHEST 1 VIEW COMPARISON:  August 09 2005 FINDINGS: The heart size and mediastinal contours are within normal limits. There is no focal infiltrate, pulmonary edema, or pleural effusion. Minus scar/identified in both bases. The visualized skeletal structures are stable. IMPRESSION: No active disease. Electronically Signed   By: Abelardo Diesel M.D.   On: 06/20/2021 10:55     Subjective: She had few blood spot in her underwear. No blood in the stool. Report pain at site of bruise and left side chest. Report transient dry cough.   Discharge Exam: Vitals:   06/23/21 0700 06/23/21 1222  BP: (!) 109/48 138/76  Pulse: 66 88  Resp: 18   Temp: 98.1 F (36.7 C) 97.9 F (36.6 C)  SpO2: 96%      General: Pt is alert, awake, not in acute distress Cardiovascular: RRR, S1/S2 +, no rubs, no gallops Respiratory: CTA bilaterally, no wheezing, no rhonchi Abdominal: Soft, NT, ND, bowel sounds + Extremities: no edema, no cyanosis    The results of significant diagnostics from this hospitalization (including imaging, microbiology, ancillary and laboratory) are listed below for reference.     Microbiology: Recent Results (from the past 240 hour(s))  Resp Panel by RT-PCR (Flu A&B, Covid) Nasopharyngeal Swab     Status: None   Collection Time: 06/20/21 10:33 AM   Specimen: Nasopharyngeal Swab;  Nasopharyngeal(NP) swabs in vial transport medium  Result Value Ref Range Status   SARS Coronavirus 2 by RT PCR NEGATIVE NEGATIVE Final    Comment: (NOTE) SARS-CoV-2 target nucleic acids are NOT DETECTED.  The SARS-CoV-2 RNA is generally detectable in upper respiratory specimens during the acute phase of infection. The lowest concentration of SARS-CoV-2 viral copies this assay can detect is 138 copies/mL. A negative result does not preclude SARS-Cov-2 infection and should not be used as the sole basis for treatment or other patient management decisions. A negative result may occur with  improper specimen collection/handling, submission of specimen other than nasopharyngeal swab, presence of viral mutation(s) within the areas targeted by this assay, and inadequate number of viral copies(<138 copies/mL). A negative result must be combined with clinical observations, patient history, and epidemiological information. The expected result is Negative.  Fact Sheet for Patients:  EntrepreneurPulse.com.au  Fact Sheet for Healthcare Providers:  IncredibleEmployment.be  This test is no t yet  approved or cleared by the [place] and  has been authorized for detection and/or diagnosis of SARS-CoV-2 by FDA under an Emergency Use Authorization (EUA). This EUA will remain  in effect (meaning this test can be used) for the duration of the COVID-19 declaration under Section 564(b)(1) of the Act, 21 U.S.C.section 360bbb-3(b)(1), unless the authorization is terminated  or revoked sooner.       Influenza A by PCR NEGATIVE NEGATIVE Final   Influenza B by PCR NEGATIVE NEGATIVE Final    Comment: (NOTE) The Xpert Xpress SARS-CoV-2/FLU/RSV plus assay is intended as an aid in the diagnosis of influenza from Nasopharyngeal swab specimens and should not be used as a sole basis for treatment. Nasal washings and aspirates are unacceptable for Xpert Xpress  SARS-CoV-2/FLU/RSV testing.  Fact Sheet for Patients: EntrepreneurPulse.com.au  Fact Sheet for Healthcare Providers: IncredibleEmployment.be  This test is not yet approved or cleared by the [place] FDA and has been authorized for detection and/or diagnosis of SARS-CoV-2 by FDA under an Emergency Use Authorization (EUA). This EUA will remain in effect (meaning this test can be used) for the duration of the COVID-19 declaration under Section 564(b)(1) of the Act, 21 U.S.C. section 360bbb-3(b)(1), unless the authorization is terminated or revoked.  Performed at [facility], [place] [address], [place], [place] [address]      Labs: BNP (last 3 results) Recent Labs    06/20/21 1034  BNP 123XX123*   Basic Metabolic Panel: Recent Labs  Lab 06/20/21 1033 06/20/21 1040 06/21/21 0234 06/21/21 0941 06/22/21 0332  NA 138 137 134* 133* 135  K 3.5 3.5 5.2* 4.2 4.3  CL 102 101 101 100 104  CO2 21*  --  '26 25 26  '$ GLUCOSE 194* 201* 122* 143* 98  BUN '16 14 15 14 11  '$ CREATININE 1.14* 1.10* 1.09* 1.00 0.73  CALCIUM 8.9  --  8.6* 8.6* 8.5*   Liver Function Tests: Recent Labs  Lab 06/20/21 1033  AST 23  ALT 15  ALKPHOS 47  BILITOT 1.1  PROT 6.3*  ALBUMIN 3.8   No results for input(s): LIPASE, AMYLASE in the last 168 hours. No results for input(s): AMMONIA in the last 168 hours. CBC: Recent Labs  Lab 06/22/21 0332 06/22/21 1700 06/22/21 1931 06/23/21 0412 06/23/21 1202  WBC 5.7 6.0 5.6 5.9 6.0  HGB 7.4* 9.2* 8.3* 8.4* 9.2*  HCT 21.3* 27.2* 24.6* 24.3* 26.8*  MCV 85.9 89.8 87.9 87.7 88.4  PLT 147* 163 141* 142* 168   Cardiac Enzymes: No results for input(s): CKTOTAL, CKMB, CKMBINDEX, TROPONINI in the last 168 hours. BNP: Invalid input(s): POCBNP CBG: Recent Labs  Lab 06/22/21 1221 06/22/21 1652 06/22/21 2108 06/23/21 0612 06/23/21 1233  GLUCAP 91 104* 84 100* 92   D-Dimer No results for input(s): DDIMER in the  last 72 hours. Hgb A1c Recent Labs    06/21/21 0234  HGBA1C 4.6*   Lipid Profile No results for input(s): CHOL, HDL, LDLCALC, TRIG, CHOLHDL, LDLDIRECT in the last 72 hours. Thyroid function studies No results for input(s): TSH, T4TOTAL, T3FREE, THYROIDAB in the last 72 hours.  Invalid input(s): FREET3 Anemia work up No results for input(s): VITAMINB12, FOLATE, FERRITIN, TIBC, IRON, RETICCTPCT in the last 72 hours. Urinalysis    Component Value Date/Time   COLORURINE YELLOW 06/20/2021 1033   APPEARANCEUR CLEAR 06/20/2021 1033   LABSPEC <1.005 (L) 06/20/2021 1033   PHURINE 5.5 06/20/2021 [place] 06/20/2021 1033   HGBUR NEGATIVE 06/20/2021  [place] 06/20/2021 [place] 06/20/2021 1033   [place] 06/20/2021 1033   NITRITE NEGATIVE 06/20/2021 1033   [place] 06/20/2021 1033   Sepsis Labs Invalid input(s): PROCALCITONIN,  WBC,  LACTICIDVEN Microbiology Recent Results (from the past 240 hour(s))  Resp Panel by RT-PCR (Flu A&B, Covid) Nasopharyngeal Swab     Status: None   Collection Time: 06/20/21 10:33 AM   Specimen: Nasopharyngeal Swab; Nasopharyngeal(NP) swabs in vial transport medium  Result Value Ref Range Status   SARS Coronavirus 2 by RT PCR NEGATIVE NEGATIVE Final    Comment: (NOTE) SARS-CoV-2 target nucleic acids are NOT DETECTED.  The SARS-CoV-2 RNA is generally detectable in upper respiratory specimens during the acute phase of infection. The lowest concentration of SARS-CoV-2 viral copies this assay can detect is 138 copies/mL. A negative result does not preclude SARS-Cov-2 infection and should not be used as the sole basis for treatment or other patient management decisions. A negative result may occur with  improper specimen collection/handling, submission of specimen other than nasopharyngeal swab, presence of viral mutation(s) within the areas targeted by this assay, and inadequate  number of viral copies(<138 copies/mL). A negative result must be combined with clinical observations, patient history, and epidemiological information. The expected result is Negative.  Fact Sheet for Patients:  EntrepreneurPulse.com.au  Fact Sheet for Healthcare Providers:  IncredibleEmployment.be  This test is no t yet approved or cleared by the [place] FDA and  has been authorized for detection and/or diagnosis of SARS-CoV-2 by FDA under an Emergency Use Authorization (EUA). This EUA will remain  in effect (meaning this test can be used) for the duration of the COVID-19 declaration under Section 564(b)(1) of the Act, 21 U.S.C.section 360bbb-3(b)(1), unless the authorization is terminated  or revoked sooner.       Influenza A by PCR NEGATIVE NEGATIVE Final   Influenza B by PCR NEGATIVE NEGATIVE Final    Comment: (NOTE) The Xpert Xpress SARS-CoV-2/FLU/RSV plus assay is intended as an aid in the diagnosis of influenza from Nasopharyngeal swab specimens and should not be used as a sole basis for treatment. Nasal washings and aspirates are unacceptable for Xpert Xpress SARS-CoV-2/FLU/RSV testing.  Fact Sheet for Patients: EntrepreneurPulse.com.au  Fact Sheet for Healthcare Providers: IncredibleEmployment.be  This test is not yet approved or cleared by the [place] FDA and has been authorized for detection and/or diagnosis of SARS-CoV-2 by FDA under an Emergency Use Authorization (EUA). This EUA will remain in effect (meaning this test can be used) for the duration of the COVID-19 declaration under Section 564(b)(1) of the Act, 21 U.S.C. section 360bbb-3(b)(1), unless the authorization is terminated or revoked.  Performed at [facility], [place] [address] [place], [place] [address]      Time coordinating discharge: 40 minutes  SIGNED:   Elmarie Shiley, MD  [facility]

## 2021-06-23 NOTE — Progress Notes (Signed)
Pt wheeled off unit by NT with all belongings

## 2021-06-23 NOTE — Progress Notes (Signed)
  Echocardiogram 2D Echocardiogram has been performed.  Yolanda Bell 06/23/2021, 4:02 PM

## 2021-06-23 NOTE — Plan of Care (Signed)
Follow up arranged with cardiology [address] office on 07/15/21, see AVS

## 2021-06-24 ENCOUNTER — Telehealth: Payer: Self-pay | Admitting: Cardiovascular Disease

## 2021-06-24 NOTE — Telephone Encounter (Signed)
Pt was D/C'd yesterday after a fall (Syncope from the COVID Vaccine or Orthostatic hypotension) on Saturday 06/20/21 she was taken off of her Xarelto due to a hematoma on her buttocks and low HGB and was told to follow up with cardio on when to restart..her OV is not until 07/15/21 so she is asking what to do until then.. I spoke with Dr. Acie Fredrickson and we will potentially restart it ext week after she has repeat CBC with her PCP.Marland Kitchen Loura Pardon NP on 06/29/21... she will hopefully also have an EKG... she says she feels she is still in NSR... she will rest until her Monday 06/29/21 appt, eat well and hydrate and call if she has any problems.Marland Kitchen but will have her PCP send her labs to [place] for Dr. Acie Fredrickson to review and determine when to restart the Xarelto.

## 2021-06-24 NOTE — Progress Notes (Signed)
Patient discharged late yesterday with orders for [facility] services. CM called the patient this am and provided choice under [email or web address] Pt had no preference. HH arranged through [facility]. Tommi Rumps with [facility] accepted the referral. Information sent to patient.

## 2021-06-24 NOTE — Telephone Encounter (Signed)
Pt c/o medication issue:  1. Name of Medication: XARELTO 20 MG TABS tablet   2. How are you currently taking this medication (dosage and times per day)? Currently not taking   3. Are you having a reaction (difficulty breathing--STAT)? no  4. What is your medication issue? pt blood pressure is causing her to pass out, pt was in the hospital since last night, pt was directed not to go back on Xarelto until she has a follow up with Dr.Nahser, pt doesnt want to go off of meds until appt on 07/15/21 without first consulting Dr. Acie Fredrickson, please advise.

## 2021-06-25 DIAGNOSIS — M109 Gout, unspecified: Secondary | ICD-10-CM | POA: Diagnosis not present

## 2021-06-25 DIAGNOSIS — I700 Atherosclerosis of aorta: Secondary | ICD-10-CM | POA: Diagnosis not present

## 2021-06-25 DIAGNOSIS — I480 Paroxysmal atrial fibrillation: Secondary | ICD-10-CM | POA: Diagnosis not present

## 2021-06-25 DIAGNOSIS — M170 Bilateral primary osteoarthritis of knee: Secondary | ICD-10-CM | POA: Diagnosis not present

## 2021-06-25 DIAGNOSIS — D62 Acute posthemorrhagic anemia: Secondary | ICD-10-CM | POA: Diagnosis not present

## 2021-06-25 DIAGNOSIS — S300XXD Contusion of lower back and pelvis, subsequent encounter: Secondary | ICD-10-CM | POA: Diagnosis not present

## 2021-06-25 DIAGNOSIS — I2510 Atherosclerotic heart disease of native coronary artery without angina pectoris: Secondary | ICD-10-CM | POA: Diagnosis not present

## 2021-06-25 DIAGNOSIS — I10 Essential (primary) hypertension: Secondary | ICD-10-CM | POA: Diagnosis not present

## 2021-06-25 DIAGNOSIS — D509 Iron deficiency anemia, unspecified: Secondary | ICD-10-CM | POA: Diagnosis not present

## 2021-06-29 DIAGNOSIS — M109 Gout, unspecified: Secondary | ICD-10-CM | POA: Diagnosis not present

## 2021-06-29 DIAGNOSIS — R55 Syncope and collapse: Secondary | ICD-10-CM | POA: Diagnosis not present

## 2021-06-29 DIAGNOSIS — I2510 Atherosclerotic heart disease of native coronary artery without angina pectoris: Secondary | ICD-10-CM | POA: Diagnosis not present

## 2021-06-29 DIAGNOSIS — I480 Paroxysmal atrial fibrillation: Secondary | ICD-10-CM | POA: Diagnosis not present

## 2021-06-29 DIAGNOSIS — D62 Acute posthemorrhagic anemia: Secondary | ICD-10-CM | POA: Diagnosis not present

## 2021-06-29 DIAGNOSIS — S300XXD Contusion of lower back and pelvis, subsequent encounter: Secondary | ICD-10-CM | POA: Diagnosis not present

## 2021-06-29 DIAGNOSIS — M170 Bilateral primary osteoarthritis of knee: Secondary | ICD-10-CM | POA: Diagnosis not present

## 2021-06-29 DIAGNOSIS — I700 Atherosclerosis of aorta: Secondary | ICD-10-CM | POA: Diagnosis not present

## 2021-06-29 DIAGNOSIS — D509 Iron deficiency anemia, unspecified: Secondary | ICD-10-CM | POA: Diagnosis not present

## 2021-06-29 DIAGNOSIS — I10 Essential (primary) hypertension: Secondary | ICD-10-CM | POA: Diagnosis not present

## 2021-06-29 DIAGNOSIS — T148XXA Other injury of unspecified body region, initial encounter: Secondary | ICD-10-CM | POA: Diagnosis not present

## 2021-07-01 DIAGNOSIS — S300XXD Contusion of lower back and pelvis, subsequent encounter: Secondary | ICD-10-CM | POA: Diagnosis not present

## 2021-07-01 DIAGNOSIS — I2510 Atherosclerotic heart disease of native coronary artery without angina pectoris: Secondary | ICD-10-CM | POA: Diagnosis not present

## 2021-07-01 DIAGNOSIS — M109 Gout, unspecified: Secondary | ICD-10-CM | POA: Diagnosis not present

## 2021-07-01 DIAGNOSIS — D509 Iron deficiency anemia, unspecified: Secondary | ICD-10-CM | POA: Diagnosis not present

## 2021-07-01 DIAGNOSIS — I480 Paroxysmal atrial fibrillation: Secondary | ICD-10-CM | POA: Diagnosis not present

## 2021-07-01 DIAGNOSIS — M170 Bilateral primary osteoarthritis of knee: Secondary | ICD-10-CM | POA: Diagnosis not present

## 2021-07-01 DIAGNOSIS — I700 Atherosclerosis of aorta: Secondary | ICD-10-CM | POA: Diagnosis not present

## 2021-07-01 DIAGNOSIS — D62 Acute posthemorrhagic anemia: Secondary | ICD-10-CM | POA: Diagnosis not present

## 2021-07-01 DIAGNOSIS — I10 Essential (primary) hypertension: Secondary | ICD-10-CM | POA: Diagnosis not present

## 2021-07-02 DIAGNOSIS — S300XXD Contusion of lower back and pelvis, subsequent encounter: Secondary | ICD-10-CM | POA: Diagnosis not present

## 2021-07-02 DIAGNOSIS — I10 Essential (primary) hypertension: Secondary | ICD-10-CM | POA: Diagnosis not present

## 2021-07-02 DIAGNOSIS — I2510 Atherosclerotic heart disease of native coronary artery without angina pectoris: Secondary | ICD-10-CM | POA: Diagnosis not present

## 2021-07-02 DIAGNOSIS — I700 Atherosclerosis of aorta: Secondary | ICD-10-CM | POA: Diagnosis not present

## 2021-07-02 DIAGNOSIS — D62 Acute posthemorrhagic anemia: Secondary | ICD-10-CM | POA: Diagnosis not present

## 2021-07-02 DIAGNOSIS — I480 Paroxysmal atrial fibrillation: Secondary | ICD-10-CM | POA: Diagnosis not present

## 2021-07-02 DIAGNOSIS — M170 Bilateral primary osteoarthritis of knee: Secondary | ICD-10-CM | POA: Diagnosis not present

## 2021-07-02 DIAGNOSIS — D509 Iron deficiency anemia, unspecified: Secondary | ICD-10-CM | POA: Diagnosis not present

## 2021-07-02 DIAGNOSIS — M109 Gout, unspecified: Secondary | ICD-10-CM | POA: Diagnosis not present

## 2021-07-03 DIAGNOSIS — I480 Paroxysmal atrial fibrillation: Secondary | ICD-10-CM | POA: Diagnosis not present

## 2021-07-03 DIAGNOSIS — M170 Bilateral primary osteoarthritis of knee: Secondary | ICD-10-CM | POA: Diagnosis not present

## 2021-07-03 DIAGNOSIS — I700 Atherosclerosis of aorta: Secondary | ICD-10-CM | POA: Diagnosis not present

## 2021-07-03 DIAGNOSIS — D509 Iron deficiency anemia, unspecified: Secondary | ICD-10-CM | POA: Diagnosis not present

## 2021-07-03 DIAGNOSIS — I2510 Atherosclerotic heart disease of native coronary artery without angina pectoris: Secondary | ICD-10-CM | POA: Diagnosis not present

## 2021-07-03 DIAGNOSIS — S300XXD Contusion of lower back and pelvis, subsequent encounter: Secondary | ICD-10-CM | POA: Diagnosis not present

## 2021-07-03 DIAGNOSIS — M109 Gout, unspecified: Secondary | ICD-10-CM | POA: Diagnosis not present

## 2021-07-03 DIAGNOSIS — D62 Acute posthemorrhagic anemia: Secondary | ICD-10-CM | POA: Diagnosis not present

## 2021-07-03 DIAGNOSIS — I10 Essential (primary) hypertension: Secondary | ICD-10-CM | POA: Diagnosis not present

## 2021-07-06 DIAGNOSIS — I480 Paroxysmal atrial fibrillation: Secondary | ICD-10-CM | POA: Diagnosis not present

## 2021-07-06 DIAGNOSIS — M109 Gout, unspecified: Secondary | ICD-10-CM | POA: Diagnosis not present

## 2021-07-06 DIAGNOSIS — D62 Acute posthemorrhagic anemia: Secondary | ICD-10-CM | POA: Diagnosis not present

## 2021-07-06 DIAGNOSIS — I700 Atherosclerosis of aorta: Secondary | ICD-10-CM | POA: Diagnosis not present

## 2021-07-06 DIAGNOSIS — M170 Bilateral primary osteoarthritis of knee: Secondary | ICD-10-CM | POA: Diagnosis not present

## 2021-07-06 DIAGNOSIS — I10 Essential (primary) hypertension: Secondary | ICD-10-CM | POA: Diagnosis not present

## 2021-07-06 DIAGNOSIS — S300XXD Contusion of lower back and pelvis, subsequent encounter: Secondary | ICD-10-CM | POA: Diagnosis not present

## 2021-07-06 DIAGNOSIS — D509 Iron deficiency anemia, unspecified: Secondary | ICD-10-CM | POA: Diagnosis not present

## 2021-07-06 DIAGNOSIS — I2510 Atherosclerotic heart disease of native coronary artery without angina pectoris: Secondary | ICD-10-CM | POA: Diagnosis not present

## 2021-07-07 DIAGNOSIS — I700 Atherosclerosis of aorta: Secondary | ICD-10-CM | POA: Diagnosis not present

## 2021-07-07 DIAGNOSIS — D509 Iron deficiency anemia, unspecified: Secondary | ICD-10-CM | POA: Diagnosis not present

## 2021-07-07 DIAGNOSIS — I480 Paroxysmal atrial fibrillation: Secondary | ICD-10-CM | POA: Diagnosis not present

## 2021-07-07 DIAGNOSIS — I10 Essential (primary) hypertension: Secondary | ICD-10-CM | POA: Diagnosis not present

## 2021-07-07 DIAGNOSIS — M109 Gout, unspecified: Secondary | ICD-10-CM | POA: Diagnosis not present

## 2021-07-07 DIAGNOSIS — D62 Acute posthemorrhagic anemia: Secondary | ICD-10-CM | POA: Diagnosis not present

## 2021-07-07 DIAGNOSIS — I2510 Atherosclerotic heart disease of native coronary artery without angina pectoris: Secondary | ICD-10-CM | POA: Diagnosis not present

## 2021-07-07 DIAGNOSIS — M170 Bilateral primary osteoarthritis of knee: Secondary | ICD-10-CM | POA: Diagnosis not present

## 2021-07-07 DIAGNOSIS — S300XXD Contusion of lower back and pelvis, subsequent encounter: Secondary | ICD-10-CM | POA: Diagnosis not present

## 2021-07-08 DIAGNOSIS — M170 Bilateral primary osteoarthritis of knee: Secondary | ICD-10-CM | POA: Diagnosis not present

## 2021-07-08 DIAGNOSIS — D62 Acute posthemorrhagic anemia: Secondary | ICD-10-CM | POA: Diagnosis not present

## 2021-07-08 DIAGNOSIS — I480 Paroxysmal atrial fibrillation: Secondary | ICD-10-CM | POA: Diagnosis not present

## 2021-07-08 DIAGNOSIS — I2510 Atherosclerotic heart disease of native coronary artery without angina pectoris: Secondary | ICD-10-CM | POA: Diagnosis not present

## 2021-07-08 DIAGNOSIS — I700 Atherosclerosis of aorta: Secondary | ICD-10-CM | POA: Diagnosis not present

## 2021-07-08 DIAGNOSIS — M109 Gout, unspecified: Secondary | ICD-10-CM | POA: Diagnosis not present

## 2021-07-08 DIAGNOSIS — D509 Iron deficiency anemia, unspecified: Secondary | ICD-10-CM | POA: Diagnosis not present

## 2021-07-08 DIAGNOSIS — I10 Essential (primary) hypertension: Secondary | ICD-10-CM | POA: Diagnosis not present

## 2021-07-08 DIAGNOSIS — S300XXD Contusion of lower back and pelvis, subsequent encounter: Secondary | ICD-10-CM | POA: Diagnosis not present

## 2021-07-08 NOTE — Progress Notes (Signed)
Cardiology Office Note    Date:  07/15/2021   ID:  Yolanda Bell Yolanda Bell, MRN [id number]   PCP:  Orvan July, NP   Yolanda Bell  Cardiologist:  Mertie Moores, MD   Advanced Practice Provider:  No care team member to display Electrophysiologist:  None   [id number]   Chief Complaint  Patient presents with   Hospitalization Follow-up     History of Present Illness:  Yolanda Bell is a 80 y.o. female with constrictive pericarditis, atrial fibrillation/flutter s/p ablation, SVT, HTN who was admitted 06/20/2021 for syncope.  Course complicated by left gluteal hematoma. History consistent with orthostasis versus vasovagal. Diuretic changed to prn, echo-,normal LVEF 50-55% Xarelto held because of drop in Hgb -repeat Hgb 9.2, Dr. Audie Box thought she would be a good candidate for watchman in future.   Patient had labs and Hgb 10.7 on 9//28/22 and Dr. Acie Fredrickson resumed Xarelto. Patient feels some skipping but thinks it's PVC's but walking in here she felt it was heavy and beating faster. Has been inactive. Says she hasn't had afib-says she knows when she's in it.  Hematoma is much better. [name] worried about not being on torsemide b/c she says she takes it for liver problems for years.never diagnosed with liver disease but patient says it may be from cortisone eye drops that she was on as a child. CT abd showed diffuse hepatic steatosis.      Past Medical History:  Diagnosis Date   Atrial fibrillation ([place])    Status post TEE cardioversion   Constrictive pericarditis    2 status post pericardial stripping at the [facility]   Gout    Hypertension    Spastic dysphonia    Supraventricular tachycardia ([place])    Status post RF ablation by Dr. Peterson Lombard    Past Surgical History:  Procedure Laterality Date   [place]  11/21/2012   Procedure: CARDIOVERSION;  Surgeon: Darlin Coco, MD;  Location: [place];   Service: Cardiovascular;;   CARDIOVERSION N/A 06/10/2014   Procedure: CARDIOVERSION;  Surgeon: Thayer Headings, MD;  Location: [place];  Service: Cardiovascular;  Laterality: N/A;   CARDIOVERSION N/A 10/14/2015   Procedure: CARDIOVERSION;  Surgeon: Josue Hector, MD;  Location: [facility] ENDOSCOPY;  Service: Cardiovascular;  Laterality: N/A;   CARDIOVERSION N/A 05/12/2016   Procedure: CARDIOVERSION;  Surgeon: Skeet Latch, MD;  Location: [facility] ENDOSCOPY;  Service: Cardiovascular;  Laterality: N/A;   [place] N/A 08/20/2016   Procedure: Atrial Fibrillation Ablation;  Surgeon: [name], MD;  Location: [place] CV LAB;  Service: Cardiovascular;  Laterality: N/A;   EYE SURGERY     cataracts   PERICARDIECTOMY     TEE WITHOUT CARDIOVERSION N/A 06/10/2014   Procedure: TRANSESOPHAGEAL ECHOCARDIOGRAM (TEE);  Surgeon: Thayer Headings, MD;  Location: [facility] ENDOSCOPY;  Service: Cardiovascular;  Laterality: N/A;   TONSILLECTOMY      Current Medications: Current Meds  Medication Sig   acetaminophen (TYLENOL) 500 MG tablet Take 1,000 mg by mouth every 6 (six) hours as needed for mild pain.   Ascorbic Acid (VITAMIN C) 500 MG CAPS Take 1 tablet by mouth daily.   Ca Phosphate-Cholecalciferol (ALIVE CALCIUM PLUS VITAMIN D3) 260-25 MG-MCG CHEW Chew 1 tablet by mouth daily.   Cholecalciferol (VITAMIN D) 125 MCG (5000 UT) CAPS Take 1 tablet by mouth daily.   denosumab (PROLIA) 60  MG/ML SOSY injection Inject 60 mg into the skin every 6 (six) months. Last dose End of may early Yolanda   DENTA 5000 PLUS 1.1 % CREA dental cream Place 1 application onto teeth See admin instructions.   glucosamine-chondroitin 500-400 MG tablet Take 1 tablet by mouth daily.   indomethacin (INDOCIN) 25 MG capsule Take 25 mg by mouth as needed (gout). For gout   metoprolol succinate (TOPROL-XL) 25 MG 24 hr tablet Take 1 tablet (25 mg total) by mouth daily.   potassium chloride (KLOR-CON  M10) 10 MEQ tablet Take 1 tablet (10 mEq total) by mouth daily as needed (take with torsemide).   rivaroxaban (XARELTO) 20 MG TABS tablet Take 1 tablet (20 mg total) by mouth daily with supper.   spironolactone (ALDACTONE) 50 MG tablet Take 1 tablet (50 mg total) by mouth daily.   torsemide (DEMADEX) 20 MG tablet Take 1 tablet (20 mg total) by mouth daily as needed (Take if your weight increases more than 2 pounds in 24 hours.).     Allergies:   Allopurinol, Amiodarone, Corticosteroids, Other, and Erythromycin   Social History   Socioeconomic History   Marital status: Divorced    Spouse name: Not on file   Number of children: Not on file   Years of education: Not on file   Highest education level: Not on file  Occupational History   Not on file  Tobacco Use   Smoking status: Never   Smokeless tobacco: Never  Vaping Use   Vaping Use: Never used  Substance and Sexual Activity   Alcohol use: Yes    Comment: occasionally   Drug use: No   Sexual activity: Not on file  Other Topics Concern   Not on file  Social History Narrative   Not on file   Social Determinants of Health   Financial Resource Strain: Not on file  Food Insecurity: Not on file  Transportation Needs: Not on file  Physical Activity: Not on file  Stress: Not on file  Social Connections: Not on file     Family History:  The patient's  family history includes Breast cancer in her sister; Emphysema in her father; Heart disease in her mother.   ROS:   Please see the history of present illness.    ROS All other systems reviewed and are negative.   PHYSICAL EXAM:   VS:  BP 140/70 (BP Location: Left Arm, Patient Position: Sitting, Cuff Size: Normal)   Pulse 74   Wt 146 lb (66.2 kg)   BMI 26.49 kg/m   Physical Exam  GEN: Well nourished, well developed, in no acute distress  Neck: no JVD, carotid bruits, or masses Cardiac:RRR; no murmurs, rubs, or gallops  Respiratory:  clear to auscultation bilaterally,  normal work of breathing GI: soft, nontender, nondistended, + BS Ext: without cyanosis, clubbing, or edema, Good distal pulses bilaterally Neuro:  Alert and Oriented x 3,  Psych: euthymic mood, full affect  Wt Readings from Last 3 Encounters:  07/15/21 146 lb (66.2 kg)  06/21/21 151 lb 0.2 oz (68.5 kg)  12/15/20 150 lb 12.8 oz (68.4 kg)      Studies/Labs Reviewed:   EKG:  EKG is  ordered today.  The ekg ordered today demonstrates NSR with PAC  Recent Labs: 06/20/2021: ALT 15; B Natriuretic Peptide 151.3 06/22/2021: BUN 11; Creatinine, Ser 0.73; Potassium 4.3; Sodium 135 06/23/2021: Hemoglobin 9.2; Platelets 168   Lipid Panel    Component Value Date/Time   CHOL 172 11/21/2019  1035   TRIG 96 11/21/2019 1035   HDL 59 11/21/2019 1035   CHOLHDL 2.9 11/21/2019 1035   CHOLHDL 3.4 08/18/2015 1147   VLDL 17 08/18/2015 1147   LDLCALC 96 11/21/2019 1035    Additional studies/ records that were reviewed today include:   Echo 06/2021 IMPRESSIONS     1. Left ventricular ejection fraction, by estimation, is 50 to 55%. The  left ventricle has low normal function. The left ventricle has no regional  wall motion abnormalities. Left ventricular diastolic parameters were  normal.   2. Right ventricular systolic function is normal. The right ventricular  size is normal. There is normal pulmonary artery systolic pressure.   3. Left atrial size was moderately dilated.   4. The mitral valve is normal in structure. Trivial mitral valve  regurgitation. No evidence of mitral stenosis.   5. Tricuspid valve regurgitation is moderate.   6. The aortic valve is tricuspid. Aortic valve regurgitation is not  visualized. No aortic stenosis is present.   7. The inferior vena cava is normal in size with greater than 50%  respiratory variability, suggesting right atrial pressure of 3 mmHg.    Risk Assessment/Calculations:    CHA2DS2-VASc Score = 4   This indicates a 4.8% annual risk of stroke. The  patient's score is based upon: CHF History: 0 HTN History: 1 Diabetes History: 0 Stroke History: 0 Vascular Disease History: 0 Age Score: 2 Gender Score: 1        ASSESSMENT:    1. Constrictive pericarditis   2. Paroxysmal atrial fibrillation (HCC)   3. Syncope and collapse   4. Essential hypertension      PLAN:  In order of problems listed above:  History Constrictive pericarditis-no recurrence  PAF/flutter S/P ablation on Xarelto-was held briefly in the hospital when she had syncope and hematoma. Now back on. F/u hgb 10.7 on 9/28. [name] requesting repeat labs today.   Syncope complicated by gluteal hematoma suspect vasovagal vs orthostatic hypotension-Xarelto held b/c of anemia. No further dizziness or syncope.Patient worried about being off torsemide(prn) since hospital. Has taken this in the past for some type of liver disease. Recommend she f/u with GI as CT showed hepatic steatosis.   HTN-stable  Shared Decision Making/Informed Consent        Medication Adjustments/Labs and Tests Ordered: Current medicines are reviewed at length with the patient today.  Concerns regarding medicines are outlined above.  Medication changes, Labs and Tests ordered today are listed in the Patient Instructions below. Patient Instructions  Medication Instructions:  Your physician recommends that you continue on your current medications as directed. Please refer to the Current Medication list given to you today.  *If you need a refill on your cardiac medications before your next appointment, please call your pharmacy*   Lab Work: TODAY: BMET, CBC  If you have labs (blood work) drawn today and your tests are completely normal, you will receive your results only by: [place] (if you have MyChart) OR A paper copy in the mail If you have any lab test that is abnormal or we need to change your treatment, we will call you to review the results.   Follow-Up: At [facility],  you and your health needs are our priority.  As part of our continuing mission to provide you with exceptional heart care, we have created designated Provider Care Teams.  These Care Teams include your primary Cardiologist (physician) and Advanced Practice Providers (APPs -  Physician Assistants and Nurse  Practitioners) who all work together to provide you with the care you need, when you need it.   Your next appointment:   2 month(s)  The format for your next appointment:   In Person  Provider:   You may see Mertie Moores, MD or one of the following Advanced Practice Providers on your designated Care Team:   Richardson Dopp, PA-C [place] [address] PA-C    Signed, Ermalinda Barrios, [place]  07/15/2021 8:47 AM    [facility] [address], [place], [place]  [address] Phone: [phone]; Fax: [phone]

## 2021-07-08 NOTE — Telephone Encounter (Signed)
Labs not in chart.  I called [facility] and they will send results.  EKG was not done.  Patient made aware lab results have been requested and we will call her once reviewed by Dr Acie Fredrickson

## 2021-07-08 NOTE — Telephone Encounter (Signed)
CBC and CMET done 9/19 received in office. Medical records to scan in

## 2021-07-08 NOTE — Telephone Encounter (Signed)
Patient is following up. She would like to know if her labs have been reviewed and what Dr. Acie Fredrickson recommends. Please advise.

## 2021-07-09 ENCOUNTER — Other Ambulatory Visit: Payer: Self-pay | Admitting: Nurse Practitioner

## 2021-07-09 ENCOUNTER — Telehealth: Payer: Self-pay | Admitting: Cardiovascular Disease

## 2021-07-09 DIAGNOSIS — I700 Atherosclerosis of aorta: Secondary | ICD-10-CM | POA: Diagnosis not present

## 2021-07-09 DIAGNOSIS — M170 Bilateral primary osteoarthritis of knee: Secondary | ICD-10-CM | POA: Diagnosis not present

## 2021-07-09 DIAGNOSIS — I10 Essential (primary) hypertension: Secondary | ICD-10-CM | POA: Diagnosis not present

## 2021-07-09 DIAGNOSIS — D62 Acute posthemorrhagic anemia: Secondary | ICD-10-CM | POA: Diagnosis not present

## 2021-07-09 DIAGNOSIS — S300XXD Contusion of lower back and pelvis, subsequent encounter: Secondary | ICD-10-CM | POA: Diagnosis not present

## 2021-07-09 DIAGNOSIS — I2510 Atherosclerotic heart disease of native coronary artery without angina pectoris: Secondary | ICD-10-CM | POA: Diagnosis not present

## 2021-07-09 DIAGNOSIS — I480 Paroxysmal atrial fibrillation: Secondary | ICD-10-CM | POA: Diagnosis not present

## 2021-07-09 DIAGNOSIS — M109 Gout, unspecified: Secondary | ICD-10-CM | POA: Diagnosis not present

## 2021-07-09 DIAGNOSIS — D509 Iron deficiency anemia, unspecified: Secondary | ICD-10-CM | POA: Diagnosis not present

## 2021-07-09 MED ORDER — RIVAROXABAN 20 MG PO TABS: 20.0000 mg | ORAL_TABLET | Freq: Every day | ORAL | 3 refills | Status: DC

## 2021-07-09 NOTE — Telephone Encounter (Signed)
Spoke with Timmothy Sours, OT and advised him that we are aware that the patient takes indomethacin prn for gout. She reports approximately 4 doses per year. I advised that she should continue Xarelto as previously stated. He thanked me for the call.

## 2021-07-09 NOTE — Telephone Encounter (Signed)
Left detailed message on [place] voice mail regarding the plan to resume Xarelto. Advised him to call back with questions or concerns.

## 2021-07-09 NOTE — Telephone Encounter (Signed)
Pt c/o medication issue:  1. Name of Medication: indomethacin (INDOCIN) 25 MG capsule  2. How are you currently taking this medication (dosage and times per day)? As needed for gout flare ups. Patient has not taken for about two months   3. Are you having a reaction (difficulty breathing--STAT)?   4. What is your medication issue? Home Health OT said this medication causes a level 2 interaction with rivaroxaban (XARELTO) 20 MG TABS tablet

## 2021-07-09 NOTE — Telephone Encounter (Signed)
Patient aware that lab results have been reviewed by Dr. Acie Fredrickson and that she may resume Xarelto 20 mg daily. She is aware of appointment on 10/5 with Ermalinda Barrios, PA. She thanked me for the call.

## 2021-07-09 NOTE — Telephone Encounter (Signed)
Pt c/o medication issue:  1. Name of Medication:   rivaroxaban (XARELTO) 20 MG TABS tablet    2. How are you currently taking this medication (dosage and times per day)? Patient has not taken medication since 06/19/21  3. Are you having a reaction (difficulty breathing--STAT)?   4. What is your medication issue? [place] OT wanted to confirm whether this patient should be taking this medication or not, so he can add it to his med list for the patient.  The patient got a call this morning to re-start the medication and he just wanted to confirm.   Please call Timmothy Sours at [facility]

## 2021-07-13 DIAGNOSIS — I2510 Atherosclerotic heart disease of native coronary artery without angina pectoris: Secondary | ICD-10-CM | POA: Diagnosis not present

## 2021-07-13 DIAGNOSIS — D509 Iron deficiency anemia, unspecified: Secondary | ICD-10-CM | POA: Diagnosis not present

## 2021-07-13 DIAGNOSIS — M170 Bilateral primary osteoarthritis of knee: Secondary | ICD-10-CM | POA: Diagnosis not present

## 2021-07-13 DIAGNOSIS — I10 Essential (primary) hypertension: Secondary | ICD-10-CM | POA: Diagnosis not present

## 2021-07-13 DIAGNOSIS — D62 Acute posthemorrhagic anemia: Secondary | ICD-10-CM | POA: Diagnosis not present

## 2021-07-13 DIAGNOSIS — I700 Atherosclerosis of aorta: Secondary | ICD-10-CM | POA: Diagnosis not present

## 2021-07-13 DIAGNOSIS — S300XXD Contusion of lower back and pelvis, subsequent encounter: Secondary | ICD-10-CM | POA: Diagnosis not present

## 2021-07-13 DIAGNOSIS — I480 Paroxysmal atrial fibrillation: Secondary | ICD-10-CM | POA: Diagnosis not present

## 2021-07-13 DIAGNOSIS — M109 Gout, unspecified: Secondary | ICD-10-CM | POA: Diagnosis not present

## 2021-07-15 ENCOUNTER — Encounter: Payer: Self-pay | Admitting: Physician Assistant

## 2021-07-15 ENCOUNTER — Ambulatory Visit: Payer: Medicare PPO | Admitting: Physician Assistant

## 2021-07-15 ENCOUNTER — Other Ambulatory Visit: Payer: Self-pay

## 2021-07-15 VITALS — BP 140/70 | HR 74 | Wt 146.0 lb

## 2021-07-15 DIAGNOSIS — D62 Acute posthemorrhagic anemia: Secondary | ICD-10-CM | POA: Diagnosis not present

## 2021-07-15 DIAGNOSIS — I10 Essential (primary) hypertension: Secondary | ICD-10-CM | POA: Diagnosis not present

## 2021-07-15 DIAGNOSIS — D509 Iron deficiency anemia, unspecified: Secondary | ICD-10-CM | POA: Diagnosis not present

## 2021-07-15 DIAGNOSIS — S300XXD Contusion of lower back and pelvis, subsequent encounter: Secondary | ICD-10-CM | POA: Diagnosis not present

## 2021-07-15 DIAGNOSIS — I480 Paroxysmal atrial fibrillation: Secondary | ICD-10-CM | POA: Diagnosis not present

## 2021-07-15 DIAGNOSIS — I700 Atherosclerosis of aorta: Secondary | ICD-10-CM | POA: Diagnosis not present

## 2021-07-15 DIAGNOSIS — M170 Bilateral primary osteoarthritis of knee: Secondary | ICD-10-CM | POA: Diagnosis not present

## 2021-07-15 DIAGNOSIS — I311 Chronic constrictive pericarditis: Secondary | ICD-10-CM

## 2021-07-15 DIAGNOSIS — M109 Gout, unspecified: Secondary | ICD-10-CM | POA: Diagnosis not present

## 2021-07-15 DIAGNOSIS — I2510 Atherosclerotic heart disease of native coronary artery without angina pectoris: Secondary | ICD-10-CM | POA: Diagnosis not present

## 2021-07-15 DIAGNOSIS — R55 Syncope and collapse: Secondary | ICD-10-CM | POA: Diagnosis not present

## 2021-07-15 LAB — CBC
Hematocrit: 37.6 % (ref 34.0–46.6)
Hemoglobin: 12.8 g/dL (ref 11.1–15.9)
MCH: 30.1 pg (ref 26.6–33.0)
MCHC: 34 g/dL (ref 31.5–35.7)
MCV: 89 fL (ref 79–97)
Platelets: 174 10*3/uL (ref 150–450)
RBC: 4.25 x10E6/uL (ref 3.77–5.28)
RDW: 14.2 % (ref 11.7–15.4)
WBC: 3.7 10*3/uL (ref 3.4–10.8)

## 2021-07-15 LAB — BASIC METABOLIC PANEL
BUN/Creatinine Ratio: 8 — ABNORMAL LOW (ref 12–28)
BUN: 6 mg/dL — ABNORMAL LOW (ref 8–27)
CO2: 23 mmol/L (ref 20–29)
Calcium: 9.6 mg/dL (ref 8.7–10.3)
Chloride: 102 mmol/L (ref 96–106)
Creatinine, Ser: 0.73 mg/dL (ref 0.57–1.00)
Glucose: 93 mg/dL (ref 70–99)
Potassium: 3.7 mmol/L (ref 3.5–5.2)
Sodium: 140 mmol/L (ref 134–144)
eGFR: 84 mL/min/{1.73_m2} (ref >59)

## 2021-07-15 NOTE — Patient Instructions (Signed)
Medication Instructions:  Your physician recommends that you continue on your current medications as directed. Please refer to the Current Medication list given to you today.  *If you need a refill on your cardiac medications before your next appointment, please call your pharmacy*   Lab Work: TODAY: BMET, CBC  If you have labs (blood work) drawn today and your tests are completely normal, you will receive your results only by: [place] (if you have MyChart) OR A paper copy in the mail If you have any lab test that is abnormal or we need to change your treatment, we will call you to review the results.   Follow-Up: At [facility], you and your health needs are our priority.  As part of our continuing mission to provide you with exceptional heart care, we have created designated Provider Care Teams.  These Care Teams include your primary Cardiologist (physician) and Advanced Practice Providers (APPs -  Physician Assistants and Nurse Practitioners) who all work together to provide you with the care you need, when you need it.   Your next appointment:   2 month(s)  The format for your next appointment:   In Person  Provider:   You may see Mertie Moores, MD or one of the following Advanced Practice Providers on your designated Care Team:   Richardson Dopp, PA-C [place], [place]

## 2021-07-16 DIAGNOSIS — D62 Acute posthemorrhagic anemia: Secondary | ICD-10-CM | POA: Diagnosis not present

## 2021-07-16 DIAGNOSIS — M170 Bilateral primary osteoarthritis of knee: Secondary | ICD-10-CM | POA: Diagnosis not present

## 2021-07-16 DIAGNOSIS — I2510 Atherosclerotic heart disease of native coronary artery without angina pectoris: Secondary | ICD-10-CM | POA: Diagnosis not present

## 2021-07-16 DIAGNOSIS — S300XXD Contusion of lower back and pelvis, subsequent encounter: Secondary | ICD-10-CM | POA: Diagnosis not present

## 2021-07-16 DIAGNOSIS — I480 Paroxysmal atrial fibrillation: Secondary | ICD-10-CM | POA: Diagnosis not present

## 2021-07-16 DIAGNOSIS — I700 Atherosclerosis of aorta: Secondary | ICD-10-CM | POA: Diagnosis not present

## 2021-07-16 DIAGNOSIS — I10 Essential (primary) hypertension: Secondary | ICD-10-CM | POA: Diagnosis not present

## 2021-07-16 DIAGNOSIS — D509 Iron deficiency anemia, unspecified: Secondary | ICD-10-CM | POA: Diagnosis not present

## 2021-07-16 DIAGNOSIS — M109 Gout, unspecified: Secondary | ICD-10-CM | POA: Diagnosis not present

## 2021-07-21 DIAGNOSIS — M810 Age-related osteoporosis without current pathological fracture: Secondary | ICD-10-CM | POA: Diagnosis not present

## 2021-07-22 DIAGNOSIS — I10 Essential (primary) hypertension: Secondary | ICD-10-CM | POA: Diagnosis not present

## 2021-07-22 DIAGNOSIS — S300XXD Contusion of lower back and pelvis, subsequent encounter: Secondary | ICD-10-CM | POA: Diagnosis not present

## 2021-07-22 DIAGNOSIS — I2510 Atherosclerotic heart disease of native coronary artery without angina pectoris: Secondary | ICD-10-CM | POA: Diagnosis not present

## 2021-07-22 DIAGNOSIS — I700 Atherosclerosis of aorta: Secondary | ICD-10-CM | POA: Diagnosis not present

## 2021-07-22 DIAGNOSIS — D509 Iron deficiency anemia, unspecified: Secondary | ICD-10-CM | POA: Diagnosis not present

## 2021-07-22 DIAGNOSIS — D62 Acute posthemorrhagic anemia: Secondary | ICD-10-CM | POA: Diagnosis not present

## 2021-07-22 DIAGNOSIS — I480 Paroxysmal atrial fibrillation: Secondary | ICD-10-CM | POA: Diagnosis not present

## 2021-07-22 DIAGNOSIS — M109 Gout, unspecified: Secondary | ICD-10-CM | POA: Diagnosis not present

## 2021-07-22 DIAGNOSIS — M170 Bilateral primary osteoarthritis of knee: Secondary | ICD-10-CM | POA: Diagnosis not present

## 2021-08-14 ENCOUNTER — Other Ambulatory Visit: Payer: Self-pay | Admitting: Cardiovascular Disease

## 2021-08-14 NOTE — Telephone Encounter (Signed)
Xarelto 20 mg refill request received. Pt is 80 years old, weight- 66.2 kg, Crea- 0.73 on 07/15/21, last seen by Dr. Acie Fredrickson on 07/15/21, Diagnosis-afib, CrCl- 66.38; Dose is appropriate based on dosing criteria. Will send in refill to requested pharmacy.

## 2021-09-14 ENCOUNTER — Other Ambulatory Visit: Payer: Self-pay | Admitting: Cardiovascular Disease

## 2021-10-18 ENCOUNTER — Encounter: Payer: Self-pay | Admitting: Cardiovascular Disease

## 2021-10-18 NOTE — Progress Notes (Signed)
Cardiology Office Note     ID:  Yolanda, Bell 05-Sep-1941, MRN [id number]  PCP:  Orvan July, NP  Cardiologist:   Mertie Moores, MD   Chief Complaint  Patient presents with   Atrial Fibrillation        Hypertension   1.  Constrictive Pericarditis 2.  A-Flutter / Fibrillation 3.  Gout 4.  Spastic dysphonia 5. SVT 6. HTN     She has done well from a cardiac standpoint.  She has had a flare up of gout which has affected both feet.   She has been taking indocin for the flare ups.    She had a bad accident in February. She was getting off an airplane and tripped on the off ramp in [place].  She broke both shoulders, left thumb and had soft tissue injury to her left foot.  She was treated conservatively (bilateral arm slings).   Her left thumb fracture was not found until she returned to [place].   She had some tachypalpitations for 22 hours during her recovery.  Her HR was 130-140 for 22 hours and resolved spontaneously.   I suspect she had a brief episode of atrial flutter.  She was quite emotional when she was telling me the issues of her fall. It is  apparently still is quite upsetting to her.  October 19, 2012: She presents today for further evaluation of atrial fib / flutter that was found at cardiac rehab.  She really cannot tell that her HR is fast and irregular.   She can feel her HR when she is lying down.  She is asymptomatic when she is up and busy doing chores.  She denies any chest pain or dyspnea.   Feb. 4, 2014: Yolanda Bell seems to have rapid atrial fibrillation. [name] on relatively high dose of metoprolol XL and also is on when necessary propranolol. He is tolerating the atrial ablation but certainly does not feel as well as she would like to. [name] here to schedule her cardioversion next week. She denies any angina. She does have some shortness of breath.  January 01, 2013:  She has maintained NSR since her cardioversion ( Feb. 11, 2014) .   She was quite  frustrated with her recent Out patient area / endoscopy department  cardioversion.  Lots of computer issues / scheduling issues.    She has done well from a cardiac standpoint.    Sept. 22, 2014:  She has done well.   She had some redness and tenderness just below her right eye.  She is not having any cardiac problems.     January 14, 2014:  She has done well.  She had a brief episode of tachycardia that lasted for several days.    Sounds like an episode of SVT.  She tried some propranolol and eventually resolved after about 5-6 hours.    June 07, 2014:  [name] presents with several weeks of palpitations. She came in today and was noted to be in atrial fibrillation. She started back on her xarelto 4 days ago. Her HR is well controlled  Oct. 5, 2015:  [name] had a cardioversion August 31.  She had a brief episode of irregular Hr  - she took some propraoolol.  .  She thinks that she eventually converted back to NSR.    In looking back , these were probably PVCs.   May , 10 , 2016;   Yolanda Bell is a 81 y.o. female who  presents for follow up of her atrial fib She was cardioverted last august.   She was found to have a few RBCs in a UA - no obvious blood   Nov. 7, 2016:  Doing well from a cardiac standpoint.  No CP , no dyspnea.   Feb 26, 2016: Feeling well  Needs laser surgery on her right eye.  Doing well.   Feb.  6, 2019: Doing well from a cardiac standpoint.   Has some bleeding folowing oral surgery .  She bled for 4 days.   Only issue is her spastic dysphonia   Feb. 10, 2020: On Jan. 20, 2020, she developed and irregular HR .   took Metoprolol 50 PO .   Felt better after several hours.   Has sinus brady with PACs on ECG  .  Is on Xarelto .  Several days later developed a brief pinching sensation in her chest    November 21, 2019: Yolanda Bell is seen today for follow-up visit.  She has a history of atrial fibrillation and is status post ablation.  She is on Xarelto. She has  been doing well.  She is not had any symptoms of Covid.  She received her second vaccine this past Monday. Has been on torsemide.   She did not respond well to furosemide She is going to a casino in [place] tomorrow. By plane   December 15, 2020 Yolanda Bell is doing well.   Having some knee issues.  No cardiac issues  Is in normal rhythm  Fell during a dog walking accident  And has a compression fx of a vertebra Has healed up now ,  Still having some buttock pain   Jan. 9, 2023 Yolanda Bell is seen for follow up of her atrial fib - s/p ablation. On xarelto She had a vasovagal syncope,  (fell in a chair,  broke the chair, ) following her covid booster in Sept. Fell on her buttocks,  had a large hematoma - Hb fell from 11 to 7.4 Received 2u PRBC. [name] was held  Echo shows EF of 50-55%    Past Medical History:  Diagnosis Date   Atrial fibrillation ([place])    Status post TEE cardioversion   Constrictive pericarditis    2 status post pericardial stripping at the [facility]   Gout    Hypertension    Spastic dysphonia    Supraventricular tachycardia ([place])    Status post RF ablation by Dr. Peterson Lombard    Past Surgical History:  Procedure Laterality Date   [place]  11/21/2012   Procedure: CARDIOVERSION;  Surgeon: Darlin Coco, MD;  Location: [facility];  Service: Cardiovascular;;   CARDIOVERSION N/A 06/10/2014   Procedure: CARDIOVERSION;  Surgeon: Thayer Headings, MD;  Location: [facility];  Service: Cardiovascular;  Laterality: N/A;   CARDIOVERSION N/A 10/14/2015   Procedure: CARDIOVERSION;  Surgeon: Josue Hector, MD;  Location: [facility] ENDOSCOPY;  Service: Cardiovascular;  Laterality: N/A;   CARDIOVERSION N/A 05/12/2016   Procedure: CARDIOVERSION;  Surgeon: Skeet Latch, MD;  Location: [facility] ENDOSCOPY;  Service: Cardiovascular;  Laterality: N/A;   [place] N/A 08/20/2016   Procedure: Atrial  Fibrillation Ablation;  Surgeon: [name], MD;  Location: [facility] CV LAB;  Service: Cardiovascular;  Laterality: N/A;   EYE SURGERY     cataracts   PERICARDIECTOMY     TEE WITHOUT CARDIOVERSION N/A 06/10/2014  Procedure: TRANSESOPHAGEAL ECHOCARDIOGRAM (TEE);  Surgeon: Thayer Headings, MD;  Location: [facility] ENDOSCOPY;  Service: Cardiovascular;  Laterality: N/A;   TONSILLECTOMY       Current Outpatient Medications  Medication Sig Dispense Refill   Ascorbic Acid (VITAMIN C) 500 MG CAPS Take 1 tablet by mouth daily.     Ca Phosphate-Cholecalciferol (ALIVE CALCIUM PLUS VITAMIN D3) 260-25 MG-MCG CHEW Chew 1 tablet by mouth daily.     denosumab (PROLIA) 60 MG/ML SOSY injection Inject 60 mg into the skin every 6 (six) months. Last dose End of may early april     DENTA 5000 PLUS 1.1 % CREA dental cream Place 1 application onto teeth See admin instructions.     glucosamine-chondroitin 500-400 MG tablet Take 1 tablet by mouth daily.     indomethacin (INDOCIN) 25 MG capsule Take 25 mg by mouth as needed (gout). For gout     metoprolol succinate (TOPROL-XL) 25 MG 24 hr tablet TAKE 1 TABLET (25 MG TOTAL) BY MOUTH DAILY. 90 tablet 3   potassium chloride (KLOR-CON M10) 10 MEQ tablet Take 1 tablet (10 mEq total) by mouth daily as needed (take with torsemide). 90 tablet 3   spironolactone (ALDACTONE) 50 MG tablet Take 1 tablet (50 mg total) by mouth daily. 90 tablet 3   torsemide (DEMADEX) 20 MG tablet Take 1 tablet (20 mg total) by mouth daily as needed (Take if your weight increases more than 2 pounds in 24 hours.). 90 tablet 3   XARELTO 20 MG TABS tablet TAKE 1 TABLET BY MOUTH DAILY WITH SUPPER. 90 tablet 1   acetaminophen (TYLENOL) 500 MG tablet Take 1,000 mg by mouth every 6 (six) hours as needed for mild pain.     Cholecalciferol (VITAMIN D) 125 MCG (5000 UT) CAPS Take 1 tablet by mouth daily.     No current facility-administered medications for this visit.    Allergies:   Allopurinol,  Amiodarone, Corticosteroids, Other, and Erythromycin    Social History:  The patient  reports that she has never smoked. She has never used smokeless tobacco. She reports current alcohol use. She reports that she does not use drugs.   Family History:  The patient's family history includes Breast cancer in her sister; Emphysema in her father; Heart disease in her mother.    ROS: Noted in current history.  Otherwise negative.   Physical Exam: Blood pressure 132/78, pulse 88, height 5' 2.25" (1.581 m), weight 142 lb 3.2 oz (64.5 kg), SpO2 98 %.  GEN:  Well nourished, well developed in no acute distress HEENT: Normal NECK: No JVD; No carotid bruits LYMPHATICS: No lymphadenopathy CARDIAC: RRR , no murmurs, rubs, gallops RESPIRATORY:  Clear to auscultation without rales, wheezing or rhonchi  ABDOMEN: Soft, non-tender, non-distended MUSCULOSKELETAL:  No edema; No deformity  SKIN: Warm and dry NEUROLOGIC:  Alert and oriented x 3   EKG:       Recent Labs: 06/20/2021: ALT 15; B Natriuretic Peptide 151.3 07/15/2021: BUN 6; Creatinine, Ser 0.73; Hemoglobin 12.8; Platelets 174; Potassium 3.7; Sodium 140    Lipid Panel    Component Value Date/Time   CHOL 172 11/21/2019 1035   TRIG 96 11/21/2019 1035   HDL 59 11/21/2019 1035   CHOLHDL 2.9 11/21/2019 1035   CHOLHDL 3.4 08/18/2015 1147   VLDL 17 08/18/2015 1147   LDLCALC 96 11/21/2019 1035      Wt Readings from Last 3 Encounters:  10/19/21 142 lb 3.2 oz (64.5 kg)  07/15/21 146 lb (66.2 kg)  06/21/21 151 lb 0.2 oz (68.5 kg)      Other studies Reviewed: Additional studies/ records that were reviewed today include: . Review of the above records demonstrates:    ASSESSMENT AND PLAN:  1.  Constrictive Pericarditis -status post pericardectomy.      Overall doing well.  2.  A-Flutter / Fibrillation -    no recurrent Afib  Cont xarelto low dose.  3.  Gout-      4. Spastic dysphonia -     5. SVT     6. HTN -   well  controlled Her HR is a bit fast.   Increase toprol XL to 25 mg bid   Current medicines are reviewed at length with the patient today.  The patient does not have concerns regarding medicines.  The following changes have been made:  no change  Labs/ tests ordered today include:   No orders of the defined types were placed in this encounter.    Disposition:   FU with me in 1 year  wit me or app     Mertie Moores, MD  10/19/2021 10:27 AM    [address], [place], [place]  [address] Phone: [phone]; Fax: [phone]

## 2021-10-19 ENCOUNTER — Ambulatory Visit: Payer: Medicare PPO | Admitting: Cardiovascular Disease

## 2021-10-19 ENCOUNTER — Other Ambulatory Visit: Payer: Self-pay

## 2021-10-19 ENCOUNTER — Encounter: Payer: Self-pay | Admitting: Cardiovascular Disease

## 2021-10-19 VITALS — BP 132/78 | HR 88 | Ht 62.25 in | Wt 142.2 lb

## 2021-10-19 DIAGNOSIS — I10 Essential (primary) hypertension: Secondary | ICD-10-CM

## 2021-10-19 DIAGNOSIS — I480 Paroxysmal atrial fibrillation: Secondary | ICD-10-CM

## 2021-10-19 MED ORDER — METOPROLOL SUCCINATE ER 50 MG PO TB24: 50.0000 mg | ORAL_TABLET | Freq: Every day | ORAL | 3 refills | Status: DC

## 2021-10-19 MED ORDER — METOPROLOL SUCCINATE ER 25 MG PO TB24: ORAL_TABLET | ORAL | 3 refills | Status: DC

## 2021-10-19 NOTE — Patient Instructions (Addendum)
Medication Instructions:  Increase your Toprol XL to 25 mg twice a day  *If you need a refill on your cardiac medications before your next appointment, please call your pharmacy*   Lab Work: none If you have labs (blood work) drawn today and your tests are completely normal, you will receive your results only by: [place] (if you have MyChart) OR A paper copy in the mail If you have any lab test that is abnormal or we need to change your treatment, we will call you to review the results.   Testing/Procedures: none   Follow-Up: At [facility], you and your health needs are our priority.  As part of our continuing mission to provide you with exceptional heart care, we have created designated Provider Care Teams.  These Care Teams include your primary Cardiologist (physician) and Advanced Practice Providers (APPs -  Physician Assistants and Nurse Practitioners) who all work together to provide you with the care you need, when you need it.  We recommend signing up for the patient portal called "MyChart".  Sign up information is provided on this After Visit Summary.  MyChart is used to connect with patients for Virtual Visits (Telemedicine).  Patients are able to view lab/test results, encounter notes, upcoming appointments, etc.  Non-urgent messages can be sent to your provider as well.   To learn more about what you can do with MyChart, go to [email or web address].    Your next appointment:   1 year(s)  The format for your next appointment:   In Person  Provider:   Mertie Moores, MD or an APP     Other Instructions

## 2021-11-25 DIAGNOSIS — H524 Presbyopia: Secondary | ICD-10-CM | POA: Diagnosis not present

## 2021-11-25 DIAGNOSIS — Z961 Presence of intraocular lens: Secondary | ICD-10-CM | POA: Diagnosis not present

## 2021-12-23 DIAGNOSIS — M170 Bilateral primary osteoarthritis of knee: Secondary | ICD-10-CM | POA: Diagnosis not present

## 2021-12-30 DIAGNOSIS — M1712 Unilateral primary osteoarthritis, left knee: Secondary | ICD-10-CM | POA: Diagnosis not present

## 2021-12-30 DIAGNOSIS — M13862 Other specified arthritis, left knee: Secondary | ICD-10-CM | POA: Diagnosis not present

## 2022-01-06 DIAGNOSIS — M1712 Unilateral primary osteoarthritis, left knee: Secondary | ICD-10-CM | POA: Diagnosis not present

## 2022-01-20 DIAGNOSIS — M810 Age-related osteoporosis without current pathological fracture: Secondary | ICD-10-CM | POA: Diagnosis not present

## 2022-02-01 ENCOUNTER — Telehealth: Payer: Self-pay | Admitting: *Deleted

## 2022-02-01 NOTE — Telephone Encounter (Signed)
? ?  Pre-operative Risk Assessment  ?  ?Patient Name: Yolanda Bell  ?DOB: 1941-06-27 ?MRN: [id number]  ? ? ? ?Request for Surgical Clearance   ? ?Procedure:  Dental Extraction - Amount of Teeth to be Pulled:  1 TOOTH TO BE EXTRACTED; (SURGICAL EXTRACTION) ? ?Date of Surgery:  Clearance TBD                              ?   ?Surgeon:  Heywood Footman, DMD, MD ?Surgeon's Group or Practice Name:  [facility] ?Phone number:  [phone] ?Fax number:  [phone] ?  ?Type of Clearance Requested:   ?- Medical  ?- Pharmacy:  Hold Rivaroxaban (Xarelto) NEEDS RECOMMENDATIONS TO HOW LONG TO SAFELY HOLD XARELTO PRIOR AND POST PROCEDURE ?  ?Type of Anesthesia:  Local  ?  ?Additional requests/questions:   ? ?Signed, ?Julaine Hua   ?02/01/2022, 2:14 PM  ? ?

## 2022-02-02 NOTE — Telephone Encounter (Signed)
Recommend continuing Xarelto for single dental extraction. Pt does not require SBE ppx. ?

## 2022-02-17 ENCOUNTER — Other Ambulatory Visit: Payer: Self-pay | Admitting: Cardiovascular Disease

## 2022-03-02 NOTE — Telephone Encounter (Signed)
Patient calling to check the status of the clearance for her dental extraction

## 2022-03-02 NOTE — Telephone Encounter (Signed)
   Primary Cardiologist: Mertie Moores, MD  Chart reviewed as part of pre-operative protocol coverage. Simple dental extractions are considered low risk procedures per guidelines and generally do not require any specific cardiac clearance. It is also generally accepted that for simple extractions and dental cleanings, there is no need to interrupt blood thinner therapy.   SBE prophylaxis is not required for the patient.  I will route this recommendation to the requesting party via Epic fax function and remove from pre-op pool.  Please call with questions.  Emmaline Life, NP-C    03/02/2022, 2:51 PM [place] [address] Office [phone] Fax [phone]

## 2022-03-31 DIAGNOSIS — R000 Tachycardia, unspecified: Secondary | ICD-10-CM | POA: Insufficient documentation

## 2022-04-05 ENCOUNTER — Telehealth: Payer: Self-pay | Admitting: Cardiovascular Disease

## 2022-04-05 ENCOUNTER — Other Ambulatory Visit: Payer: Self-pay | Admitting: Cardiovascular Disease

## 2022-04-05 DIAGNOSIS — I480 Paroxysmal atrial fibrillation: Secondary | ICD-10-CM

## 2022-04-05 NOTE — Telephone Encounter (Signed)
STAT if HR is under 50 or over 120 (normal HR is 60-100 beats per minute)  What is your heart rate? 90-110  Do you have a log of your heart rate readings (document readings)?  Few minutes ago- HR 95  HR 100  Do you have any other symptoms?  No   Pt states that she her heart rate has been really high since Wednesday. She states that she has no other symptoms. Pt made an appt with Dr. Elease Hashimoto 04/06/22 1:40pm.

## 2022-04-05 NOTE — Telephone Encounter (Signed)
Prescription refill request for Xarelto received.  Indication: Afib  Last office visit: 10/19/21 ([facility])  Weight: 54.5kg Age: 81 Scr: 0.73 (07/15/21)  CrCl: 53.2ml/min  Appropriate dose and refill sent to requested pharmacy.

## 2022-04-06 ENCOUNTER — Encounter: Payer: Self-pay | Admitting: Cardiovascular Disease

## 2022-04-06 ENCOUNTER — Ambulatory Visit: Payer: Medicare PPO | Admitting: Cardiovascular Disease

## 2022-04-06 VITALS — BP 138/78 | HR 133 | Ht 62.25 in | Wt 143.4 lb

## 2022-04-06 DIAGNOSIS — I4891 Unspecified atrial fibrillation: Secondary | ICD-10-CM

## 2022-04-06 MED ORDER — PROPRANOLOL HCL 10 MG PO TABS: 10.0000 mg | ORAL_TABLET | Freq: Four times a day (QID) | ORAL | 1 refills | Status: DC

## 2022-04-06 NOTE — H&P (View-Only) (Signed)
Cardiology Office Note     ID:  Yolanda Bell, Yolanda Bell [birth year], MRN [id number]  PCP:  Orvan July, NP  Cardiologist:   Mertie Moores, MD   Chief Complaint  Patient presents with   Atrial Fibrillation   Hypertension        1.  Constrictive Pericarditis 2.  A-Flutter / Fibrillation 3.  Gout 4.  Spastic dysphonia 5. SVT 6. HTN     She has done well from a cardiac standpoint.  She has had a flare up of gout which has affected both feet.   She has been taking indocin for the flare ups.    She had a bad accident in February. She was getting off an airplane and tripped on the off ramp in [place].  She broke both shoulders, left thumb and had soft tissue injury to her left foot.  She was treated conservatively (bilateral arm slings).   Her left thumb fracture was not found until she returned to [facility].   She had some tachypalpitations for 22 hours during her recovery.  Her HR was 130-140 for 22 hours and resolved spontaneously.   I suspect she had a brief episode of atrial flutter.  She was quite emotional when she was telling me the issues of her fall. It is  apparently still is quite upsetting to her.  October 19, 2012: She presents today for further evaluation of atrial fib / flutter that was found at cardiac rehab.  She really cannot tell that her HR is fast and irregular.   She can feel her HR when she is lying down.  She is asymptomatic when she is up and busy doing chores.  She denies any chest pain or dyspnea.   Feb. 4, 2014: Yolanda Bell seems to have rapid atrial fibrillation. [name] on relatively high dose of metoprolol XL and also is on when necessary propranolol. He is tolerating the atrial ablation but certainly does not feel as well as she would like to. [name] here to schedule her cardioversion next week. She denies any angina. She does have some shortness of breath.  January 01, 2013:  She has maintained NSR since her cardioversion ( Feb. 11, 2014) .   She was quite  frustrated with her recent Out patient area / endoscopy department  cardioversion.  Lots of computer issues / scheduling issues.    She has done well from a cardiac standpoint.    Sept. 22, 2014:  She has done well.   She had some redness and tenderness just below her right eye.  She is not having any cardiac problems.     January 14, 2014:  She has done well.  She had a brief episode of tachycardia that lasted for several days.    Sounds like an episode of SVT.  She tried some propranolol and eventually resolved after about 5-6 hours.    June 07, 2014:  Bryanah presents with several weeks of palpitations. She came in today and was noted to be in atrial fibrillation. She started back on her xarelto 4 days ago. Her HR is well controlled  Oct. 5, 2015:  Ashaki had a cardioversion August 31.  She had a brief episode of irregular Hr  - she took some propraoolol.  .  She thinks that she eventually converted back to NSR.    In looking back , these were probably PVCs.   May , 10 , 2016;   Yolanda Bell is a 81 y.o. female who  presents for follow up of her atrial fib She was cardioverted last august.   She was found to have a few RBCs in a UA - no obvious blood   Nov. 7, 2016:  Doing well from a cardiac standpoint.  No CP , no dyspnea.   Feb 26, 2016: Feeling well  Needs laser surgery on her right eye.  Doing well.   Feb.  6, 2019: Doing well from a cardiac standpoint.   Has some bleeding folowing oral surgery .  She bled for 4 days.   Only issue is her spastic dysphonia   Feb. 10, 2020: On Jan. 20, 2020, she developed and irregular HR .   took Metoprolol 50 PO .   Felt better after several hours.   Has sinus brady with PACs on ECG  .  Is on Xarelto .  Several days later developed a brief pinching sensation in her chest    November 21, 2019: Yolanda Bell is seen today for follow-up visit.  She has a history of atrial fibrillation and is status post ablation.  She is on Xarelto. She has  been doing well.  She is not had any symptoms of Covid.  She received her second vaccine this past Monday. Has been on torsemide.   She did not respond well to furosemide She is going to a casino in [place] tomorrow. By plane   December 15, 2020 Yolanda Bell is doing well.   Having some knee issues.  No cardiac issues  Is in normal rhythm  Fell during a dog walking accident  And has a compression fx of a vertebra Has healed up now ,  Still having some buttock pain   Jan. 9, 2023 Yolanda Bell is seen for follow up of her atrial fib - s/p ablation. On xarelto She had a vasovagal syncope,  (fell in a chair,  broke the chair, ) following her covid booster in Sept. Fell on her buttocks,  had a large hematoma - Hb fell from 11 to 7.4 Received 2u PRBC. Yolanda Bell was held  Echo shows EF of 50-55%  April 06, 2022: Yolanda Bell is seen today for a working visit.  She called the office yesterday complaining of a fast heart rate.  Heart rates been fast for the last day or so.  She has a history of atrial flutter/atrial fibrillation many years ago.  She has been on  Xarelto 20 mg a day since that time.  Has a hx of Afib ablation in 2017 or 2018 [facility])  She needs to have some oral surgery performed in September.  In the past she sees she has had similar oral surgery and had lots of bleeding complications when her Xarelto was started on the day immediately after her procedure.  I have recommended that we hold her Xarelto for another day or so to allow for the surgical site to heal up before we restart Xarelto.    Past Medical History:  Diagnosis Date   Atrial fibrillation ([place])    Status post TEE cardioversion   Constrictive pericarditis    2 status post pericardial stripping at the [facility]   Gout    Hypertension    Spastic dysphonia    Supraventricular tachycardia ([place])    Status post RF ablation by Dr. Peterson Lombard    Past Surgical History:  Procedure Laterality Date   [place]  11/21/2012   Procedure: CARDIOVERSION;  Surgeon: Darlin Coco, MD;  Location: [place];  Service: Cardiovascular;;   CARDIOVERSION N/A 06/10/2014   Procedure: CARDIOVERSION;  Surgeon: Thayer Headings, MD;  Location: [place];  Service: Cardiovascular;  Laterality: N/A;   CARDIOVERSION N/A 10/14/2015   Procedure: CARDIOVERSION;  Surgeon: Josue Hector, MD;  Location: [facility] ENDOSCOPY;  Service: Cardiovascular;  Laterality: N/A;   CARDIOVERSION N/A 05/12/2016   Procedure: CARDIOVERSION;  Surgeon: Skeet Latch, MD;  Location: [place];  Service: Cardiovascular;  Laterality: N/A;   [place] N/A 08/20/2016   Procedure: Atrial Fibrillation Ablation;  Surgeon: [name], MD;  Location: [facility];  Service: Cardiovascular;  Laterality: N/A;   EYE SURGERY     cataracts   PERICARDIECTOMY     TEE WITHOUT CARDIOVERSION N/A 06/10/2014   Procedure: TRANSESOPHAGEAL ECHOCARDIOGRAM (TEE);  Surgeon: Thayer Headings, MD;  Location: [facility] ENDOSCOPY;  Service: Cardiovascular;  Laterality: N/A;   TONSILLECTOMY       Current Outpatient Medications  Medication Sig Dispense Refill   acetaminophen (TYLENOL) 500 MG tablet Take 1,000 mg by mouth every 6 (six) hours as needed for mild pain.     Ascorbic Acid (VITAMIN C) 500 MG CAPS Take 1 tablet by mouth daily.     Ca Phosphate-Cholecalciferol (ALIVE CALCIUM PLUS VITAMIN D3) 260-25 MG-MCG CHEW Chew 1 tablet by mouth daily.     denosumab (PROLIA) 60 MG/ML SOSY injection Inject 60 mg into the skin every 6 (six) months. Last dose End of may early april     DENTA 5000 PLUS 1.1 % CREA dental cream Place 1 application onto teeth See admin instructions.     indomethacin (INDOCIN) 25 MG capsule Take 25 mg by mouth as needed (gout). For gout     metoprolol succinate (TOPROL-XL) 25 MG 24 hr tablet Take one tablet by mouth (25 mg) twice a day. 180 tablet 3   propranolol (INDERAL) 10 MG  tablet Take 1 tablet (10 mg total) by mouth 4 (four) times daily. AS NEEDED 120 tablet 1   spironolactone (ALDACTONE) 50 MG tablet TAKE 1 TABLET BY MOUTH EVERY DAY 90 tablet 2   XARELTO 20 MG TABS tablet TAKE 1 TABLET BY MOUTH DAILY WITH SUPPER 90 tablet 1   potassium chloride (KLOR-CON M10) 10 MEQ tablet Take 1 tablet (10 mEq total) by mouth daily as needed (take with torsemide). (Patient not taking: Reported on 04/06/2022) 90 tablet 3   torsemide (DEMADEX) 20 MG tablet Take 1 tablet (20 mg total) by mouth daily as needed (Take if your weight increases more than 2 pounds in 24 hours.). (Patient not taking: Reported on 04/06/2022) 90 tablet 3   No current facility-administered medications for this visit.    Allergies:   Allopurinol, Amiodarone, Corticosteroids, Other, and Erythromycin    Social History:  The patient  reports that she has never smoked. She has never used smokeless tobacco. She reports current alcohol use. She reports that she does not use drugs.   Family History:  The patient's family history includes Breast cancer in her sister; Emphysema in her father; Heart disease in her mother.    ROS: Noted in current history.  Otherwise negative.   Physical Exam: Blood pressure 138/78, pulse (!) 133, height 5' 2.25" (1.581 m), weight 143 lb 6.4 oz (65 kg), SpO2 99 %.  GEN:  Well nourished, well developed in no acute distress HEENT: Normal NECK: No JVD; No carotid bruits LYMPHATICS: No lymphadenopathy CARDIAC: irreg. Irreg,  tachycardic  RESPIRATORY:  Clear to auscultation without rales, wheezing or rhonchi  ABDOMEN: Soft, non-tender, non-distended MUSCULOSKELETAL:  No edema; No deformity  SKIN: Warm and dry NEUROLOGIC:  Alert and oriented x 3    EKG:    April 06, 2022: Atrial fibrillation with a heart rate of 133.  Nonspecific ST abnormality.   Recent Labs: 06/20/2021: ALT 15; B Natriuretic Peptide 151.3 07/15/2021: BUN 6; Creatinine, Ser 0.73; Hemoglobin 12.8; Platelets  174; Potassium 3.7; Sodium 140    Lipid Panel    Component Value Date/Time   CHOL 172 11/21/2019 1035   TRIG 96 11/21/2019 1035   HDL 59 11/21/2019 1035   CHOLHDL 2.9 11/21/2019 1035   CHOLHDL 3.4 08/18/2015 1147   VLDL 17 08/18/2015 1147   LDLCALC 96 11/21/2019 1035      Wt Readings from Last 3 Encounters:  04/06/22 143 lb 6.4 oz (65 kg)  10/19/21 142 lb 3.2 oz (64.5 kg)  07/15/21 146 lb (66.2 kg)      Other studies Reviewed: Additional studies/ records that were reviewed today include: . Review of the above records demonstrates:    ASSESSMENT AND PLAN:  1.  Constrictive Pericarditis -status post pericardectomy.      Overall doing well.  2.  A-Flutter / Fibrillation -   she presents with recurrent atrial fibrillation today.  She could tell that her heart rate is fast.  She is not especially symptomatic.  We will increase her metoprolol to 50 mg twice a day at least until she is cardioverted.  At that time we can decide based on her heart rate whether to continue metoprolol 50 mg twice daily or reduce her back down to metoprolol 25 mg a day.  I will also give her some propranolol to take on an as-needed basis if she needs rapid acting beta-blocker.  She has been intolerant to amiodarone in the past. I discussed the case with Dr. Curt Bears.  He thinks that she would be a good candidate for redo ablation.  They apparently have new equipment/new catheters and current ablations are quite successful.  3.  Gout-      4. Spastic dysphonia -   stable     6. HTN -   well controlled   Current medicines are reviewed at length with the patient today.  The patient does not have concerns regarding medicines.  The following changes have been made:  no change  Labs/ tests ordered today include:   Orders Placed This Encounter  Procedures   CBC   Basic metabolic panel   Ambulatory referral to Cardiac Electrophysiology   EKG 12-Lead     Disposition:        Mertie Moores, MD  04/06/2022 2:21 PM    [facility] [place], [place], [place]  [address] Phone: [phone]; Fax: [phone]

## 2022-04-06 NOTE — Telephone Encounter (Signed)
Dr. Elease Hashimoto reached out to preop team to let us know the following information, see below re: upcoming oral surgery.  Will route to callback team to reach out to oral surgeon's office to relay these recommendations. It looks like the initial clearance below had been handled by the preop team but Dr. Elease Hashimoto is referencing additional surgery; just need to make sure this is the dental office that is going to be performing the procedure so they are looped in. Will otherwise remove from preop APP box.

## 2022-04-06 NOTE — Progress Notes (Signed)
Cardiology Office Note     ID:  Falisha, Osment [birth year], MRN [id number]  PCP:  Orvan July, NP  Cardiologist:   Mertie Moores, MD   Chief Complaint  Patient presents with   Atrial Fibrillation   Hypertension        1.  Constrictive Pericarditis 2.  A-Flutter / Fibrillation 3.  Gout 4.  Spastic dysphonia 5. SVT 6. HTN     She has done well from a cardiac standpoint.  She has had a flare up of gout which has affected both feet.   She has been taking indocin for the flare ups.    She had a bad accident in February. She was getting off an airplane and tripped on the off ramp in [place].  She broke both shoulders, left thumb and had soft tissue injury to her left foot.  She was treated conservatively (bilateral arm slings).   Her left thumb fracture was not found until she returned to [facility].   She had some tachypalpitations for 22 hours during her recovery.  Her HR was 130-140 for 22 hours and resolved spontaneously.   I suspect she had a brief episode of atrial flutter.  She was quite emotional when she was telling me the issues of her fall. It is  apparently still is quite upsetting to her.  October 19, 2012: She presents today for further evaluation of atrial fib / flutter that was found at cardiac rehab.  She really cannot tell that her HR is fast and irregular.   She can feel her HR when she is lying down.  She is asymptomatic when she is up and busy doing chores.  She denies any chest pain or dyspnea.   Feb. 4, 2014: [name] seems to have rapid atrial fibrillation. [name] on relatively high dose of metoprolol XL and also is on when necessary propranolol. He is tolerating the atrial ablation but certainly does not feel as well as she would like to. [name] here to schedule her cardioversion next week. She denies any angina. She does have some shortness of breath.  January 01, 2013:  She has maintained NSR since her cardioversion ( Feb. 11, 2014) .   She was quite  frustrated with her recent Out patient area / endoscopy department  cardioversion.  Lots of computer issues / scheduling issues.    She has done well from a cardiac standpoint.    Sept. 22, 2014:  She has done well.   She had some redness and tenderness just below her right eye.  She is not having any cardiac problems.     January 14, 2014:  She has done well.  She had a brief episode of tachycardia that lasted for several days.    Sounds like an episode of SVT.  She tried some propranolol and eventually resolved after about 5-6 hours.    June 07, 2014:  Bryanah presents with several weeks of palpitations. She came in today and was noted to be in atrial fibrillation. She started back on her xarelto 4 days ago. Her HR is well controlled  Oct. 5, 2015:  Ashaki had a cardioversion August 31.  She had a brief episode of irregular Hr  - she took some propraoolol.  .  She thinks that she eventually converted back to NSR.    In looking back , these were probably PVCs.   May , 10 , 2016;   [name] is a 81 y.o. female who  presents for follow up of her atrial fib She was cardioverted last august.   She was found to have a few RBCs in a UA - no obvious blood   Nov. 7, 2016:  Doing well from a cardiac standpoint.  No CP , no dyspnea.   Feb 26, 2016: Feeling well  Needs laser surgery on her right eye.  Doing well.   Feb.  6, 2019: Doing well from a cardiac standpoint.   Has some bleeding folowing oral surgery .  She bled for 4 days.   Only issue is her spastic dysphonia   Feb. 10, 2020: On Jan. 20, 2020, she developed and irregular HR .   took Metoprolol 50 PO .   Felt better after several hours.   Has sinus brady with PACs on ECG  .  Is on Xarelto .  Several days later developed a brief pinching sensation in her chest    November 21, 2019: [name] is seen today for follow-up visit.  She has a history of atrial fibrillation and is status post ablation.  She is on Xarelto. She has  been doing well.  She is not had any symptoms of Covid.  She received her second vaccine this past Monday. Has been on torsemide.   She did not respond well to furosemide She is going to a casino in [place] tomorrow. By plane   December 15, 2020 [name] is doing well.   Having some knee issues.  No cardiac issues  Is in normal rhythm  Fell during a dog walking accident  And has a compression fx of a vertebra Has healed up now ,  Still having some buttock pain   Jan. 9, 2023 [name] is seen for follow up of her atrial fib - s/p ablation. On xarelto She had a vasovagal syncope,  (fell in a chair,  broke the chair, ) following her covid booster in Sept. Fell on her buttocks,  had a large hematoma - Hb fell from 11 to 7.4 Received 2u PRBC. [name] was held  Echo shows EF of 50-55%  April 06, 2022: [name] is seen today for a working visit.  She called the office yesterday complaining of a fast heart rate.  Heart rates been fast for the last day or so.  She has a history of atrial flutter/atrial fibrillation many years ago.  She has been on  Xarelto 20 mg a day since that time.  Has a hx of Afib ablation in 2017 or 2018 [facility])  She needs to have some oral surgery performed in September.  In the past she sees she has had similar oral surgery and had lots of bleeding complications when her Xarelto was started on the day immediately after her procedure.  I have recommended that we hold her Xarelto for another day or so to allow for the surgical site to heal up before we restart Xarelto.    Past Medical History:  Diagnosis Date   Atrial fibrillation ([place])    Status post TEE cardioversion   Constrictive pericarditis    2 status post pericardial stripping at the [facility]   Gout    Hypertension    Spastic dysphonia    Supraventricular tachycardia ([place])    Status post RF ablation by Dr. Peterson Lombard    Past Surgical History:  Procedure Laterality Date   [place]  11/21/2012   Procedure: CARDIOVERSION;  Surgeon: Darlin Coco, MD;  Location: [place];  Service: Cardiovascular;;   CARDIOVERSION N/A 06/10/2014   Procedure: CARDIOVERSION;  Surgeon: Thayer Headings, MD;  Location: [place];  Service: Cardiovascular;  Laterality: N/A;   CARDIOVERSION N/A 10/14/2015   Procedure: CARDIOVERSION;  Surgeon: Josue Hector, MD;  Location: [facility] ENDOSCOPY;  Service: Cardiovascular;  Laterality: N/A;   CARDIOVERSION N/A 05/12/2016   Procedure: CARDIOVERSION;  Surgeon: Skeet Latch, MD;  Location: [place];  Service: Cardiovascular;  Laterality: N/A;   [place] N/A 08/20/2016   Procedure: Atrial Fibrillation Ablation;  Surgeon: [name], MD;  Location: [facility];  Service: Cardiovascular;  Laterality: N/A;   EYE SURGERY     cataracts   PERICARDIECTOMY     TEE WITHOUT CARDIOVERSION N/A 06/10/2014   Procedure: TRANSESOPHAGEAL ECHOCARDIOGRAM (TEE);  Surgeon: Thayer Headings, MD;  Location: [facility] ENDOSCOPY;  Service: Cardiovascular;  Laterality: N/A;   TONSILLECTOMY       Current Outpatient Medications  Medication Sig Dispense Refill   acetaminophen (TYLENOL) 500 MG tablet Take 1,000 mg by mouth every 6 (six) hours as needed for mild pain.     Ascorbic Acid (VITAMIN C) 500 MG CAPS Take 1 tablet by mouth daily.     Ca Phosphate-Cholecalciferol (ALIVE CALCIUM PLUS VITAMIN D3) 260-25 MG-MCG CHEW Chew 1 tablet by mouth daily.     denosumab (PROLIA) 60 MG/ML SOSY injection Inject 60 mg into the skin every 6 (six) months. Last dose End of may early april     DENTA 5000 PLUS 1.1 % CREA dental cream Place 1 application onto teeth See admin instructions.     indomethacin (INDOCIN) 25 MG capsule Take 25 mg by mouth as needed (gout). For gout     metoprolol succinate (TOPROL-XL) 25 MG 24 hr tablet Take one tablet by mouth (25 mg) twice a day. 180 tablet 3   propranolol (INDERAL) 10 MG  tablet Take 1 tablet (10 mg total) by mouth 4 (four) times daily. AS NEEDED 120 tablet 1   spironolactone (ALDACTONE) 50 MG tablet TAKE 1 TABLET BY MOUTH EVERY DAY 90 tablet 2   XARELTO 20 MG TABS tablet TAKE 1 TABLET BY MOUTH DAILY WITH SUPPER 90 tablet 1   potassium chloride (KLOR-CON M10) 10 MEQ tablet Take 1 tablet (10 mEq total) by mouth daily as needed (take with torsemide). (Patient not taking: Reported on 04/06/2022) 90 tablet 3   torsemide (DEMADEX) 20 MG tablet Take 1 tablet (20 mg total) by mouth daily as needed (Take if your weight increases more than 2 pounds in 24 hours.). (Patient not taking: Reported on 04/06/2022) 90 tablet 3   No current facility-administered medications for this visit.    Allergies:   Allopurinol, Amiodarone, Corticosteroids, Other, and Erythromycin    Social History:  The patient  reports that she has never smoked. She has never used smokeless tobacco. She reports current alcohol use. She reports that she does not use drugs.   Family History:  The patient's family history includes Breast cancer in her sister; Emphysema in her father; Heart disease in her mother.    ROS: Noted in current history.  Otherwise negative.   Physical Exam: Blood pressure 138/78, pulse (!) 133, height 5' 2.25" (1.581 m), weight 143 lb 6.4 oz (65 kg), SpO2 99 %.  GEN:  Well nourished, well developed in no acute distress HEENT: Normal NECK: No JVD; No carotid bruits LYMPHATICS: No lymphadenopathy CARDIAC: irreg. Irreg,  tachycardic  RESPIRATORY:  Clear to auscultation without rales, wheezing or rhonchi  ABDOMEN: Soft, non-tender, non-distended MUSCULOSKELETAL:  No edema; No deformity  SKIN: Warm and dry NEUROLOGIC:  Alert and oriented x 3    EKG:    April 06, 2022: Atrial fibrillation with a heart rate of 133.  Nonspecific ST abnormality.   Recent Labs: 06/20/2021: ALT 15; B Natriuretic Peptide 151.3 07/15/2021: BUN 6; Creatinine, Ser 0.73; Hemoglobin 12.8; Platelets  174; Potassium 3.7; Sodium 140    Lipid Panel    Component Value Date/Time   CHOL 172 11/21/2019 1035   TRIG 96 11/21/2019 1035   HDL 59 11/21/2019 1035   CHOLHDL 2.9 11/21/2019 1035   CHOLHDL 3.4 08/18/2015 1147   VLDL 17 08/18/2015 1147   LDLCALC 96 11/21/2019 1035      Wt Readings from Last 3 Encounters:  04/06/22 143 lb 6.4 oz (65 kg)  10/19/21 142 lb 3.2 oz (64.5 kg)  07/15/21 146 lb (66.2 kg)      Other studies Reviewed: Additional studies/ records that were reviewed today include: . Review of the above records demonstrates:    ASSESSMENT AND PLAN:  1.  Constrictive Pericarditis -status post pericardectomy.      Overall doing well.  2.  A-Flutter / Fibrillation -   she presents with recurrent atrial fibrillation today.  She could tell that her heart rate is fast.  She is not especially symptomatic.  We will increase her metoprolol to 50 mg twice a day at least until she is cardioverted.  At that time we can decide based on her heart rate whether to continue metoprolol 50 mg twice daily or reduce her back down to metoprolol 25 mg a day.  I will also give her some propranolol to take on an as-needed basis if she needs rapid acting beta-blocker.  She has been intolerant to amiodarone in the past. I discussed the case with Dr. Curt Bears.  He thinks that she would be a good candidate for redo ablation.  They apparently have new equipment/new catheters and current ablations are quite successful.  3.  Gout-      4. Spastic dysphonia -   stable     6. HTN -   well controlled   Current medicines are reviewed at length with the patient today.  The patient does not have concerns regarding medicines.  The following changes have been made:  no change  Labs/ tests ordered today include:   Orders Placed This Encounter  Procedures   CBC   Basic metabolic panel   Ambulatory referral to Cardiac Electrophysiology   EKG 12-Lead     Disposition:        Mertie Moores, MD  04/06/2022 2:21 PM    [facility] [place], [place], [place]  [address] Phone: [phone]; Fax: [phone]

## 2022-04-06 NOTE — Telephone Encounter (Signed)
I tried to call the DDS office in reference the notes from Ronie Spies, [facility]. I will fax these notes over to the DDS office. I will try to reach them in the morning as well.

## 2022-04-07 DIAGNOSIS — M1711 Unilateral primary osteoarthritis, right knee: Secondary | ICD-10-CM | POA: Diagnosis not present

## 2022-04-07 LAB — CBC
Hematocrit: 38.1 % (ref 34.0–46.6)
Hemoglobin: 12.9 g/dL (ref 11.1–15.9)
MCH: 30.6 pg (ref 26.6–33.0)
MCHC: 33.9 g/dL (ref 31.5–35.7)
MCV: 91 fL (ref 79–97)
Platelets: 207 10*3/uL (ref 150–450)
RBC: 4.21 x10E6/uL (ref 3.77–5.28)
RDW: 13.5 % (ref 11.7–15.4)
WBC: 6.8 10*3/uL (ref 3.4–10.8)

## 2022-04-07 LAB — BASIC METABOLIC PANEL
BUN/Creatinine Ratio: 12 (ref 12–28)
BUN: 10 mg/dL (ref 8–27)
CO2: 22 mmol/L (ref 20–29)
Calcium: 9.6 mg/dL (ref 8.7–10.3)
Chloride: 98 mmol/L (ref 96–106)
Creatinine, Ser: 0.81 mg/dL (ref 0.57–1.00)
Glucose: 90 mg/dL (ref 70–99)
Potassium: 4 mmol/L (ref 3.5–5.2)
Sodium: 138 mmol/L (ref 134–144)
eGFR: 74 mL/min/{1.73_m2} (ref >59)

## 2022-04-08 ENCOUNTER — Other Ambulatory Visit (INDEPENDENT_AMBULATORY_CARE_PROVIDER_SITE_OTHER): Payer: Medicare PPO | Admitting: Cardiovascular Disease

## 2022-04-08 DIAGNOSIS — I4819 Other persistent atrial fibrillation: Secondary | ICD-10-CM

## 2022-04-08 NOTE — Progress Notes (Signed)
Orders for DC cardioversion

## 2022-04-08 NOTE — Anesthesia Preprocedure Evaluation (Addendum)
Anesthesia Evaluation  Patient identified by MRN, date of birth, ID band Patient awake    Reviewed: Allergy & Precautions, NPO status , Patient's Chart, lab work & pertinent test results  History of Anesthesia Complications Negative for: history of anesthetic complications  Airway Mallampati: II  TM Distance: >3 FB Neck ROM: Full    Dental  (+) Missing,    Pulmonary neg pulmonary ROS,    Pulmonary exam normal        Cardiovascular hypertension, + dysrhythmias Atrial Fibrillation and Supra Ventricular Tachycardia  Rhythm:Irregular Rate:Tachycardia  Hx of constrictive pericarditis  TTE 06/2021: EF 50-55%, moderate LAE, moderate TR   Neuro/Psych negative neurological ROS  negative psych ROS   GI/Hepatic negative GI ROS, Neg liver ROS,   Endo/Other  negative endocrine ROS  Renal/GU negative Renal ROS  negative genitourinary   Musculoskeletal negative musculoskeletal ROS (+)   Abdominal   Peds  Hematology negative hematology ROS (+)   Anesthesia Other Findings Day of surgery medications reviewed with patient.  Reproductive/Obstetrics negative OB ROS                            Anesthesia Physical Anesthesia Plan  ASA: 3  Anesthesia Plan: General   Post-op Pain Management: Minimal or no pain anticipated   Induction: Intravenous  PONV Risk Score and Plan: Treatment may vary due to age or medical condition and Propofol infusion  Airway Management Planned: Mask  Additional Equipment: None  Intra-op Plan:   Post-operative Plan:   Informed Consent: I have reviewed the patients History and Physical, chart, labs and discussed the procedure including the risks, benefits and alternatives for the proposed anesthesia with the patient or authorized representative who has indicated his/her understanding and acceptance.       Plan Discussed with: CRNA  Anesthesia Plan Comments:         Anesthesia Quick Evaluation

## 2022-04-09 ENCOUNTER — Encounter (HOSPITAL_COMMUNITY)
Admission: RE | Disposition: A | Discharge status: Home / Self Care | Payer: Self-pay | Source: Home / Self Care | Attending: Internal Medicine

## 2022-04-09 ENCOUNTER — Ambulatory Visit (HOSPITAL_COMMUNITY): Payer: Medicare PPO | Admitting: Anesthesiology

## 2022-04-09 ENCOUNTER — Ambulatory Visit (HOSPITAL_BASED_OUTPATIENT_CLINIC_OR_DEPARTMENT_OTHER): Payer: Medicare PPO | Admitting: Anesthesiology

## 2022-04-09 ENCOUNTER — Ambulatory Visit (HOSPITAL_COMMUNITY)
Admission: RE | Admit: 2022-04-09 | Discharge: 2022-04-09 | Disposition: A | Discharge status: Home / Self Care | Payer: Medicare PPO | Attending: Internal Medicine | Admitting: Internal Medicine

## 2022-04-09 ENCOUNTER — Other Ambulatory Visit: Payer: Self-pay

## 2022-04-09 ENCOUNTER — Encounter (HOSPITAL_COMMUNITY): Payer: Self-pay | Admitting: Internal Medicine

## 2022-04-09 DIAGNOSIS — I10 Essential (primary) hypertension: Secondary | ICD-10-CM

## 2022-04-09 DIAGNOSIS — I4891 Unspecified atrial fibrillation: Secondary | ICD-10-CM

## 2022-04-09 DIAGNOSIS — I4819 Other persistent atrial fibrillation: Secondary | ICD-10-CM | POA: Diagnosis not present

## 2022-04-09 DIAGNOSIS — Z7901 Long term (current) use of anticoagulants: Secondary | ICD-10-CM | POA: Insufficient documentation

## 2022-04-09 DIAGNOSIS — J383 Other diseases of vocal cords: Secondary | ICD-10-CM | POA: Diagnosis not present

## 2022-04-09 DIAGNOSIS — M109 Gout, unspecified: Secondary | ICD-10-CM | POA: Diagnosis not present

## 2022-04-09 HISTORY — PX: CARDIOVERSION: SHX1299

## 2022-04-09 SURGERY — CARDIOVERSION
Anesthesia: General

## 2022-04-09 MED ORDER — PROPOFOL 10 MG/ML IV BOLUS
INTRAVENOUS | Status: DC | PRN
  Administered 2022-04-09: 60 mg via INTRAVENOUS

## 2022-04-09 MED ORDER — METOPROLOL SUCCINATE ER 25 MG PO TB24: ORAL_TABLET | ORAL | 3 refills | Status: DC

## 2022-04-09 MED ORDER — POTASSIUM CHLORIDE CRYS ER 10 MEQ PO TBCR: 10.0000 meq | EXTENDED_RELEASE_TABLET | Freq: Every day | ORAL | 3 refills | Status: DC | PRN

## 2022-04-09 MED ORDER — SODIUM CHLORIDE 0.9 % IV SOLN: INTRAVENOUS | Status: DC

## 2022-04-09 MED ORDER — LIDOCAINE 2% (20 MG/ML) 5 ML SYRINGE
INTRAMUSCULAR | Status: DC | PRN
  Administered 2022-04-09: 60 mg via INTRAVENOUS

## 2022-04-09 NOTE — CV Procedure (Signed)
Procedure: Electrical Cardioversion Indications:  Atrial Fibrillation  Procedure Details:  Consent: Risks of procedure as well as the alternatives and risks of each were explained to the (patient/caregiver).  Consent for procedure obtained.  Time Out: Verified patient identification, verified procedure, site/side was marked, verified correct patient position, special equipment/implants available, medications/allergies/relevent history reviewed, required imaging and test results available. PERFORMED.  Patient placed on cardiac monitor, pulse oximetry, supplemental oxygen as necessary.  Sedation given:  propofol per anesthesia Pacer pads placed anterior and posterior chest.  Cardioverted 1 time(s).  Cardioversion with synchronized biphasic 120J shock.  Evaluation: Findings: Post procedure EKG shows:  sinus bradycardia Complications: None Patient did tolerate procedure well.  Time Spent Directly with the Patient:  30 minutes   Elouise Munroe 04/09/2022, 8:05 AM

## 2022-04-09 NOTE — Anesthesia Postprocedure Evaluation (Signed)
Anesthesia Post Note  Patient: Yolanda Bell  Procedure(s) Performed: CARDIOVERSION     Patient location during evaluation: PACU Anesthesia Type: General Level of consciousness: awake and alert Pain management: pain level controlled Vital Signs Assessment: post-procedure vital signs reviewed and stable Respiratory status: spontaneous breathing, nonlabored ventilation and respiratory function stable Cardiovascular status: blood pressure returned to baseline Postop Assessment: no apparent nausea or vomiting Anesthetic complications: no   No notable events documented.  Last Vitals:  Vitals:   04/09/22 0711  BP: 122/77  Pulse: (!) 105  Resp: 14  Temp: (!) 36.3 C  SpO2: 97%    Last Pain:  Vitals:   04/09/22 0711  PainSc: 0-No pain                 Marthenia Rolling

## 2022-04-09 NOTE — Transfer of Care (Signed)
Immediate Anesthesia Transfer of Care Note  Patient: Yolanda Bell  Procedure(s) Performed: CARDIOVERSION  Patient Location: Short Stay  Anesthesia Type:MAC  Level of Consciousness: drowsy  Airway & Oxygen Therapy: Patient Spontanous Breathing  Post-op Assessment: Report given to RN and Post -op Vital signs reviewed and stable  Post vital signs: Reviewed and stable  Last Vitals:  Vitals Value Taken Time  BP 94/54   Temp    Pulse 56   Resp 19   SpO2 96     Last Pain:  Vitals:   04/09/22 0711  PainSc: 0-No pain         Complications: No notable events documented.

## 2022-04-09 NOTE — Discharge Instructions (Signed)

## 2022-04-09 NOTE — Anesthesia Procedure Notes (Signed)
Procedure Name: General with mask airway Date/Time: 04/09/2022 7:52 AM  Performed by: Erick Colace, CRNAPre-anesthesia Checklist: Patient identified, Emergency Drugs available, Suction available and Patient being monitored Patient Re-evaluated:Patient Re-evaluated prior to induction Oxygen Delivery Method: Ambu bag Preoxygenation: Pre-oxygenation with 100% oxygen Induction Type: IV induction Dental Injury: Teeth and Oropharynx as per pre-operative assessment

## 2022-04-09 NOTE — Interval H&P Note (Signed)
History and Physical Interval Note:  04/09/2022 7:54 AM  Yolanda Bell  has presented today for surgery, with the diagnosis of afib RVR.  The various methods of treatment have been discussed with the patient and family. After consideration of risks, benefits and other options for treatment, the patient has consented to  Procedure(s): CARDIOVERSION (N/A) as a surgical intervention.  The patient's history has been reviewed, patient examined, no change in status, stable for surgery.  I have reviewed the patient's chart and labs.  Questions were answered to the patient's satisfaction.     Elouise Munroe

## 2022-04-14 DIAGNOSIS — M1711 Unilateral primary osteoarthritis, right knee: Secondary | ICD-10-CM | POA: Diagnosis not present

## 2022-04-21 DIAGNOSIS — M1711 Unilateral primary osteoarthritis, right knee: Secondary | ICD-10-CM | POA: Diagnosis not present

## 2022-04-26 DIAGNOSIS — I480 Paroxysmal atrial fibrillation: Secondary | ICD-10-CM | POA: Diagnosis not present

## 2022-04-26 DIAGNOSIS — Z8739 Personal history of other diseases of the musculoskeletal system and connective tissue: Secondary | ICD-10-CM | POA: Diagnosis not present

## 2022-04-26 DIAGNOSIS — Z7901 Long term (current) use of anticoagulants: Secondary | ICD-10-CM | POA: Diagnosis not present

## 2022-04-26 DIAGNOSIS — M810 Age-related osteoporosis without current pathological fracture: Secondary | ICD-10-CM | POA: Diagnosis not present

## 2022-04-26 DIAGNOSIS — D509 Iron deficiency anemia, unspecified: Secondary | ICD-10-CM | POA: Diagnosis not present

## 2022-04-26 DIAGNOSIS — Z0000 Encounter for general adult medical examination without abnormal findings: Secondary | ICD-10-CM | POA: Diagnosis not present

## 2022-04-26 DIAGNOSIS — I700 Atherosclerosis of aorta: Secondary | ICD-10-CM | POA: Diagnosis not present

## 2022-04-26 DIAGNOSIS — I2510 Atherosclerotic heart disease of native coronary artery without angina pectoris: Secondary | ICD-10-CM | POA: Diagnosis not present

## 2022-04-26 DIAGNOSIS — Z86010 Personal history of colonic polyps: Secondary | ICD-10-CM | POA: Diagnosis not present

## 2022-04-29 ENCOUNTER — Other Ambulatory Visit: Payer: Self-pay | Admitting: Cardiovascular Disease

## 2022-04-29 DIAGNOSIS — I4891 Unspecified atrial fibrillation: Secondary | ICD-10-CM

## 2022-05-18 DIAGNOSIS — Z1231 Encounter for screening mammogram for malignant neoplasm of breast: Secondary | ICD-10-CM | POA: Diagnosis not present

## 2022-05-20 ENCOUNTER — Telehealth: Payer: Self-pay | Admitting: Cardiovascular Disease

## 2022-05-20 DIAGNOSIS — J029 Acute pharyngitis, unspecified: Secondary | ICD-10-CM | POA: Diagnosis not present

## 2022-05-20 DIAGNOSIS — U071 COVID-19: Secondary | ICD-10-CM | POA: Diagnosis not present

## 2022-05-20 DIAGNOSIS — H66001 Acute suppurative otitis media without spontaneous rupture of ear drum, right ear: Secondary | ICD-10-CM | POA: Diagnosis not present

## 2022-05-20 DIAGNOSIS — R509 Fever, unspecified: Secondary | ICD-10-CM | POA: Diagnosis not present

## 2022-05-20 DIAGNOSIS — R6883 Chills (without fever): Secondary | ICD-10-CM | POA: Diagnosis not present

## 2022-05-20 NOTE — Telephone Encounter (Signed)
Patient c/o Palpitations:  High priority if patient c/o lightheadedness, shortness of breath, or chest pain  How long have you had palpitations/irregular HR/ Afib? Are you having the symptoms now?  Monitor reads irregular HR, patient states she feels it is irregular, but she is unsure whether she is in afib  Are you currently experiencing lightheadedness, SOB or CP?  No   Do you have a history of afib (atrial fibrillation) or irregular heart rhythm?  Yes   Have you checked your BP or HR? (document readings if available):  8/10: 122/69 85  Are you experiencing any other symptoms?  No, aside from ENT/sinus symptoms

## 2022-05-20 NOTE — Telephone Encounter (Signed)
Spoke with pt who states she developed sore throat, ear pain, fever and body aches yesterday and continues today.  Pt notes her HR does feel irregular but she is not certain if she is in Afib.  Pt states current HR is 85.  She denies current CP, SOB or dizziness.  She is taking Xarelto and Metoprolol and has Propranolol to take for HR greater than 120.  She is scheduled to see Dr Curt Bears 05/24/2022 for discussion of ablation. Pt advised to continue current medications and contact her PCP or an UC for further evaluation of current illness with fever as illness can exacerbate Afib.  She may need to cancel her appointment on 08/14 with Dr Curt Bears if symptoms continue.  Reviewed ED precautions.  Pt verbalizes understanding and agrees with current plan.

## 2022-05-22 ENCOUNTER — Encounter (HOSPITAL_BASED_OUTPATIENT_CLINIC_OR_DEPARTMENT_OTHER): Payer: Self-pay | Admitting: Emergency Medicine

## 2022-05-22 ENCOUNTER — Emergency Department (HOSPITAL_BASED_OUTPATIENT_CLINIC_OR_DEPARTMENT_OTHER): Payer: Medicare PPO

## 2022-05-22 ENCOUNTER — Emergency Department (HOSPITAL_BASED_OUTPATIENT_CLINIC_OR_DEPARTMENT_OTHER)
Admission: EM | Admit: 2022-05-22 | Discharge: 2022-05-22 | Disposition: A | Discharge status: Home / Self Care | Payer: Medicare PPO | Attending: Emergency Medicine | Admitting: Emergency Medicine

## 2022-05-22 DIAGNOSIS — M19071 Primary osteoarthritis, right ankle and foot: Secondary | ICD-10-CM | POA: Diagnosis not present

## 2022-05-22 DIAGNOSIS — S9031XA Contusion of right foot, initial encounter: Secondary | ICD-10-CM | POA: Insufficient documentation

## 2022-05-22 DIAGNOSIS — I4891 Unspecified atrial fibrillation: Secondary | ICD-10-CM | POA: Diagnosis not present

## 2022-05-22 DIAGNOSIS — X58XXXA Exposure to other specified factors, initial encounter: Secondary | ICD-10-CM | POA: Insufficient documentation

## 2022-05-22 DIAGNOSIS — M7731 Calcaneal spur, right foot: Secondary | ICD-10-CM | POA: Diagnosis not present

## 2022-05-22 DIAGNOSIS — T148XXA Other injury of unspecified body region, initial encounter: Secondary | ICD-10-CM

## 2022-05-22 DIAGNOSIS — S8011XA Contusion of right lower leg, initial encounter: Secondary | ICD-10-CM | POA: Diagnosis not present

## 2022-05-22 DIAGNOSIS — S99921A Unspecified injury of right foot, initial encounter: Secondary | ICD-10-CM | POA: Diagnosis present

## 2022-05-22 NOTE — ED Provider Notes (Addendum)
[facility] EMERGENCY DEPARTMENT Provider Note   CSN: [id number] Arrival date & time: 05/22/22  1825    History  Chief Complaint  Patient presents with   Skin Discoloration    Yolanda Bell is a 81 y.o. female history of A-fib on anticoagulation, recent COVID diagnosis here for evaluation of discoloration to dorsum of right foot which began yesterday.  No known injury or trauma.  No pain.  No numbness or weakness. Called PCP today and they stated she needed to be seen at the emergency department for ultrasound to rule out DVT.  No fever, weakness, falls, injuries, redness, warmth.  HPI     Home Medications Prior to Admission medications   Medication Sig Start Date End Date Taking? Authorizing Provider  acetaminophen (TYLENOL) 500 MG tablet Take 1,000 mg by mouth every 6 (six) hours as needed for mild pain.    [provider]  Ascorbic Acid (VITAMIN C) 500 MG CAPS Take 500 mg by mouth daily.    [provider]  Calcium Carb-Cholecalciferol (CALCIUM 600+D) 600-20 MG-MCG TABS Take 1 tablet by mouth daily.    [provider]  denosumab (PROLIA) 60 MG/ML SOSY injection Inject 60 mg into the skin every 6 (six) months. Last dose End of may early april    [provider]  DENTA 5000 PLUS 1.1 % CREA dental cream Place 1 application  onto teeth 2 (two) times daily. 05/31/21   [provider]  indomethacin (INDOCIN) 25 MG capsule Take 25 mg by mouth as needed (gout). For gout    [provider]  metoprolol succinate (TOPROL-XL) 25 MG 24 hr tablet Take one tablet by mouth (25 mg) twice a day. 04/09/22   Elouise Munroe, MD  potassium chloride (KLOR-CON M10) 10 MEQ tablet Take 1 tablet (10 mEq total) by mouth daily as needed (take with torsemide). 04/09/22   Elouise Munroe, MD  propranolol (INDERAL) 10 MG tablet TAKE 1 TABLET BY MOUTH 4 TIMES A DAY AS NEEDED 04/30/22   Nahser, Wonda Cheng, MD  spironolactone (ALDACTONE) 50 MG tablet  TAKE 1 TABLET BY MOUTH EVERY DAY 02/17/22   Nahser, Wonda Cheng, MD  torsemide (DEMADEX) 20 MG tablet Take 1 tablet (20 mg total) by mouth daily as needed (Take if your weight increases more than 2 pounds in 24 hours.). 06/23/21   Regalado, Cassie Freer, MD  XARELTO 20 MG TABS tablet TAKE 1 TABLET BY MOUTH DAILY WITH SUPPER 04/05/22   Nahser, Wonda Cheng, MD      Allergies    Allopurinol, Amiodarone, Corticosteroids, Other, and Erythromycin    Review of Systems   Review of Systems  Constitutional: Negative.   HENT: Negative.    Respiratory: Negative.    Cardiovascular: Negative.   Gastrointestinal: Negative.   Genitourinary: Negative.   Musculoskeletal: Negative.   Skin:        Dorsal right foot discoloration   Neurological: Negative.   All other systems reviewed and are negative.   Physical Exam Updated Vital Signs BP 122/88   Pulse 98   Temp 99.1 F (37.3 C) (Oral)   Resp 18   Ht '5\' 2"'$  (1.575 m)   Wt 62.1 kg   SpO2 (!) 98%   BMI 25.06 kg/m  Physical Exam Vitals and nursing note reviewed.  Constitutional:      General: She is not in acute distress.    Appearance: She is well-developed. She is not ill-appearing, toxic-appearing or diaphoretic.  HENT:  Head: Normocephalic and atraumatic.     Nose: Nose normal.     Mouth/Throat:     Mouth: Mucous membranes are moist.  Eyes:     Pupils: Pupils are equal, round, and reactive to light.  Cardiovascular:     Rate and Rhythm: Normal rate.     Pulses: Normal pulses.          Dorsalis pedis pulses are 2+ on the right side and 2+ on the left side.       Posterior tibial pulses are 2+ on the right side and 2+ on the left side.     Heart sounds: Normal heart sounds.  Pulmonary:     Effort: Pulmonary effort is normal. No respiratory distress.     Breath sounds: Normal breath sounds.  Abdominal:     General: Bowel sounds are normal. There is no distension.     Palpations: Abdomen is soft.  Musculoskeletal:        General: No  swelling, tenderness, deformity or signs of injury. Normal range of motion.     Cervical back: Normal range of motion.     Right lower leg: No edema.     Left lower leg: No edema.  Skin:    General: Skin is warm and dry.     Capillary Refill: Capillary refill takes less than 2 seconds.     Comments: Ecchymosis to dorsum right foot. No edema, warmth, pallor.  Tender. No COVID toes  Neurological:     General: No focal deficit present.     Mental Status: She is alert.  Psychiatric:        Mood and Affect: Mood normal.      ED Results / Procedures / Treatments   Labs (all labs ordered are listed, but only abnormal results are displayed) Labs Reviewed - No data to display  EKG None  Radiology DG Foot Complete Right  Result Date: 05/22/2022 CLINICAL DATA:  Bruise EXAM: RIGHT FOOT COMPLETE - 3+ VIEW COMPARISON:  None Available. FINDINGS: There is no evidence of fracture or dislocation. There are mild degenerative changes of the first metatarsophalangeal joint with joint space narrowing and sclerosis. Small posterior calcaneal spur is present. Soft tissues are unremarkable. IMPRESSION: No acute abnormality. Electronically Signed   By: Ronney Asters M.D.   On: 05/22/2022 22:18    Procedures Procedures    Medications Ordered in ED Medications - No data to display  ED Course/ Medical Decision Making/ A&P   81 year old history of A-fib on chronic anticoagulation, recent COVID diagnosis yesterday here for evaluation of skin discoloration to dorsum of right foot.  No known injury or trauma.  No numbness or weakness.  Suspect likely had small injury to dorsum of foot.  She was however specifically sent in by PCP for patient for ultrasound to rule out DVT.  She is currently anticoagulated, not missed any doses has no calf tenderness, erythema or warmth.  Unfortunately do not have ultrasound available at this time.  So for outpatient imaging however I suspect this is more likely due to minor  traumatic injury  Imaging personally viewed and interpreted:  Foot x-ray without fx, dislocation  Will return tomorrow for ultrasound imaging given this is her PCP sent her for.  She is ready anticoagulated do not feel she needs additional medications at this time.  The patient has been appropriately medically screened and/or stabilized in the ED. I have low suspicion for any other emergent medical condition which would require further screening,  evaluation or treatment in the ED or require inpatient management.  Patient is hemodynamically stable and in no acute distress.  Patient able to ambulate in department prior to ED.  Evaluation does not show acute pathology that would require ongoing or additional emergent interventions while in the emergency department or further inpatient treatment.  I have discussed the diagnosis with the patient and answered all questions.  Pain is been managed while in the emergency department and patient has no further complaints prior to discharge.  Patient is comfortable with plan discussed in room and is stable for discharge at this time.  I have discussed strict return precautions for returning to the emergency department.  Patient was encouraged to follow-up with PCP/specialist refer to at discharge.                           Medical Decision Making Amount and/or Complexity of Data Reviewed External Data Reviewed: labs, radiology and notes. Radiology: ordered and independent interpretation performed. Decision-making details documented in ED Course.  Risk OTC drugs. Prescription drug management. Parenteral controlled substances. Diagnosis or treatment significantly limited by social determinants of health.     Final Clinical Impression(s) / ED Diagnoses Final diagnoses:  Bruising    Rx / DC Orders ED Discharge Orders          Ordered    US Venous Img Lower Unilateral Right        05/22/22 2207                Fuller Makin A,  PA-C 05/22/22 2250    Malvin Johns, MD 05/22/22 2355

## 2022-05-22 NOTE — Discharge Instructions (Signed)
Return at your scheduled time for the ultrasound of your leg  Make sure to ice and elevate  If symptoms worsen please return for new or worsening symptoms otherwise follow-up with primary care provider

## 2022-05-22 NOTE — ED Triage Notes (Signed)
Pt reports dark discoloration to top of RT foot since yesterday; no injury, denies pain; started medication for Covid Fri pm

## 2022-05-23 ENCOUNTER — Other Ambulatory Visit: Payer: Medicare PPO

## 2022-05-23 ENCOUNTER — Ambulatory Visit (HOSPITAL_BASED_OUTPATIENT_CLINIC_OR_DEPARTMENT_OTHER)
Admission: RE | Admit: 2022-05-23 | Discharge: 2022-05-23 | Disposition: A | Discharge status: Home / Self Care | Payer: Medicare PPO | Source: Ambulatory Visit | Attending: Emergency Medicine | Admitting: Emergency Medicine

## 2022-05-23 DIAGNOSIS — M79661 Pain in right lower leg: Secondary | ICD-10-CM | POA: Diagnosis not present

## 2022-05-23 DIAGNOSIS — M79604 Pain in right leg: Secondary | ICD-10-CM | POA: Insufficient documentation

## 2022-05-24 ENCOUNTER — Institutional Professional Consult (permissible substitution): Payer: Medicare PPO | Admitting: Cardiology

## 2022-05-31 ENCOUNTER — Other Ambulatory Visit: Payer: Self-pay

## 2022-05-31 ENCOUNTER — Inpatient Hospital Stay (HOSPITAL_COMMUNITY)
Admission: EM | Admit: 2022-05-31 | Discharge: 2022-06-11 | DRG: 329 | Disposition: A | Discharge status: Home Health | Payer: Medicare PPO | Attending: Family Medicine | Admitting: Family Medicine

## 2022-05-31 ENCOUNTER — Encounter (HOSPITAL_COMMUNITY): Payer: Self-pay | Admitting: Emergency Medicine

## 2022-05-31 DIAGNOSIS — I480 Paroxysmal atrial fibrillation: Secondary | ICD-10-CM | POA: Diagnosis present

## 2022-05-31 DIAGNOSIS — D62 Acute posthemorrhagic anemia: Secondary | ICD-10-CM | POA: Diagnosis not present

## 2022-05-31 DIAGNOSIS — K769 Liver disease, unspecified: Secondary | ICD-10-CM | POA: Diagnosis present

## 2022-05-31 DIAGNOSIS — R000 Tachycardia, unspecified: Secondary | ICD-10-CM | POA: Diagnosis present

## 2022-05-31 DIAGNOSIS — R251 Tremor, unspecified: Secondary | ICD-10-CM | POA: Diagnosis present

## 2022-05-31 DIAGNOSIS — Z8249 Family history of ischemic heart disease and other diseases of the circulatory system: Secondary | ICD-10-CM

## 2022-05-31 DIAGNOSIS — D4102 Neoplasm of uncertain behavior of left kidney: Secondary | ICD-10-CM | POA: Diagnosis present

## 2022-05-31 DIAGNOSIS — Z881 Allergy status to other antibiotic agents status: Secondary | ICD-10-CM | POA: Diagnosis not present

## 2022-05-31 DIAGNOSIS — K5752 Diverticulitis of both small and large intestine without perforation or abscess without bleeding: Secondary | ICD-10-CM | POA: Diagnosis not present

## 2022-05-31 DIAGNOSIS — K5700 Diverticulitis of small intestine with perforation and abscess without bleeding: Secondary | ICD-10-CM | POA: Diagnosis not present

## 2022-05-31 DIAGNOSIS — E876 Hypokalemia: Secondary | ICD-10-CM

## 2022-05-31 DIAGNOSIS — I2510 Atherosclerotic heart disease of native coronary artery without angina pectoris: Secondary | ICD-10-CM | POA: Diagnosis present

## 2022-05-31 DIAGNOSIS — S32000A Wedge compression fracture of unspecified lumbar vertebra, initial encounter for closed fracture: Secondary | ICD-10-CM

## 2022-05-31 DIAGNOSIS — J383 Other diseases of vocal cords: Secondary | ICD-10-CM | POA: Diagnosis present

## 2022-05-31 DIAGNOSIS — Z8616 Personal history of COVID-19: Secondary | ICD-10-CM

## 2022-05-31 DIAGNOSIS — Z888 Allergy status to other drugs, medicaments and biological substances status: Secondary | ICD-10-CM | POA: Diagnosis not present

## 2022-05-31 DIAGNOSIS — K5712 Diverticulitis of small intestine without perforation or abscess without bleeding: Secondary | ICD-10-CM | POA: Diagnosis not present

## 2022-05-31 DIAGNOSIS — Z7901 Long term (current) use of anticoagulants: Secondary | ICD-10-CM | POA: Diagnosis not present

## 2022-05-31 DIAGNOSIS — R0902 Hypoxemia: Secondary | ICD-10-CM | POA: Diagnosis not present

## 2022-05-31 DIAGNOSIS — R3915 Urgency of urination: Secondary | ICD-10-CM | POA: Diagnosis present

## 2022-05-31 DIAGNOSIS — Z803 Family history of malignant neoplasm of breast: Secondary | ICD-10-CM | POA: Diagnosis not present

## 2022-05-31 DIAGNOSIS — R188 Other ascites: Secondary | ICD-10-CM | POA: Diagnosis present

## 2022-05-31 DIAGNOSIS — M109 Gout, unspecified: Secondary | ICD-10-CM | POA: Diagnosis present

## 2022-05-31 DIAGNOSIS — K5792 Diverticulitis of intestine, part unspecified, without perforation or abscess without bleeding: Secondary | ICD-10-CM | POA: Diagnosis present

## 2022-05-31 DIAGNOSIS — K922 Gastrointestinal hemorrhage, unspecified: Secondary | ICD-10-CM

## 2022-05-31 DIAGNOSIS — I10 Essential (primary) hypertension: Secondary | ICD-10-CM | POA: Diagnosis present

## 2022-05-31 DIAGNOSIS — R1032 Left lower quadrant pain: Secondary | ICD-10-CM | POA: Diagnosis not present

## 2022-05-31 DIAGNOSIS — Z9049 Acquired absence of other specified parts of digestive tract: Secondary | ICD-10-CM | POA: Diagnosis not present

## 2022-05-31 DIAGNOSIS — Z79899 Other long term (current) drug therapy: Secondary | ICD-10-CM

## 2022-05-31 DIAGNOSIS — M4856XA Collapsed vertebra, not elsewhere classified, lumbar region, initial encounter for fracture: Secondary | ICD-10-CM | POA: Diagnosis present

## 2022-05-31 DIAGNOSIS — N2889 Other specified disorders of kidney and ureter: Secondary | ICD-10-CM

## 2022-05-31 DIAGNOSIS — I493 Ventricular premature depolarization: Secondary | ICD-10-CM | POA: Diagnosis present

## 2022-05-31 DIAGNOSIS — K658 Other peritonitis: Secondary | ICD-10-CM | POA: Diagnosis not present

## 2022-05-31 DIAGNOSIS — K55029 Acute infarction of small intestine, extent unspecified: Secondary | ICD-10-CM | POA: Diagnosis present

## 2022-05-31 DIAGNOSIS — R109 Unspecified abdominal pain: Principal | ICD-10-CM

## 2022-05-31 DIAGNOSIS — R9431 Abnormal electrocardiogram [ECG] [EKG]: Secondary | ICD-10-CM | POA: Diagnosis not present

## 2022-05-31 DIAGNOSIS — Z8719 Personal history of other diseases of the digestive system: Secondary | ICD-10-CM

## 2022-05-31 DIAGNOSIS — I4891 Unspecified atrial fibrillation: Secondary | ICD-10-CM | POA: Diagnosis not present

## 2022-05-31 DIAGNOSIS — H9202 Otalgia, left ear: Secondary | ICD-10-CM | POA: Diagnosis present

## 2022-05-31 DIAGNOSIS — Z90710 Acquired absence of both cervix and uterus: Secondary | ICD-10-CM

## 2022-05-31 DIAGNOSIS — Z825 Family history of asthma and other chronic lower respiratory diseases: Secondary | ICD-10-CM

## 2022-05-31 DIAGNOSIS — K6389 Other specified diseases of intestine: Secondary | ICD-10-CM | POA: Diagnosis not present

## 2022-05-31 DIAGNOSIS — R1084 Generalized abdominal pain: Secondary | ICD-10-CM | POA: Diagnosis not present

## 2022-05-31 DIAGNOSIS — K5732 Diverticulitis of large intestine without perforation or abscess without bleeding: Secondary | ICD-10-CM

## 2022-05-31 DIAGNOSIS — K5730 Diverticulosis of large intestine without perforation or abscess without bleeding: Secondary | ICD-10-CM | POA: Diagnosis not present

## 2022-05-31 DIAGNOSIS — R110 Nausea: Secondary | ICD-10-CM | POA: Diagnosis not present

## 2022-05-31 DIAGNOSIS — Z01810 Encounter for preprocedural cardiovascular examination: Secondary | ICD-10-CM | POA: Diagnosis not present

## 2022-05-31 NOTE — ED Triage Notes (Signed)
Pt BIB EMS from home for lower abd pain and N/V. Pt states she does not remember the last time that she peed. Pt has felt the urge but has not been able to urinate.

## 2022-06-01 ENCOUNTER — Emergency Department (HOSPITAL_COMMUNITY): Payer: Medicare PPO

## 2022-06-01 DIAGNOSIS — Z01810 Encounter for preprocedural cardiovascular examination: Secondary | ICD-10-CM

## 2022-06-01 DIAGNOSIS — K5712 Diverticulitis of small intestine without perforation or abscess without bleeding: Secondary | ICD-10-CM | POA: Diagnosis present

## 2022-06-01 DIAGNOSIS — R1084 Generalized abdominal pain: Secondary | ICD-10-CM | POA: Diagnosis not present

## 2022-06-01 DIAGNOSIS — R109 Unspecified abdominal pain: Secondary | ICD-10-CM | POA: Diagnosis not present

## 2022-06-01 DIAGNOSIS — I480 Paroxysmal atrial fibrillation: Secondary | ICD-10-CM

## 2022-06-01 DIAGNOSIS — I10 Essential (primary) hypertension: Secondary | ICD-10-CM

## 2022-06-01 DIAGNOSIS — R3915 Urgency of urination: Secondary | ICD-10-CM

## 2022-06-01 LAB — COMPREHENSIVE METABOLIC PANEL
ALT: 12 U/L (ref 0–44)
AST: 18 U/L (ref 15–41)
Albumin: 3.8 g/dL (ref 3.5–5.0)
Alkaline Phosphatase: 53 U/L (ref 38–126)
Anion gap: 11 (ref 5–15)
BUN: 8 mg/dL (ref 8–23)
CO2: 24 mmol/L (ref 22–32)
Calcium: 9 mg/dL (ref 8.9–10.3)
Chloride: 100 mmol/L (ref 98–111)
Creatinine, Ser: 0.81 mg/dL (ref 0.44–1.00)
GFR, Estimated: >60 mL/min (ref >60)
Glucose, Bld: 120 mg/dL — ABNORMAL HIGH (ref 70–99)
Potassium: 3.5 mmol/L (ref 3.5–5.1)
Sodium: 135 mmol/L (ref 135–145)
Total Bilirubin: 0.5 mg/dL (ref 0.3–1.2)
Total Protein: 6.4 g/dL — ABNORMAL LOW (ref 6.5–8.1)

## 2022-06-01 LAB — BASIC METABOLIC PANEL
Anion gap: 10 (ref 5–15)
BUN: 8 mg/dL (ref 8–23)
CO2: 23 mmol/L (ref 22–32)
Calcium: 8.5 mg/dL — ABNORMAL LOW (ref 8.9–10.3)
Chloride: 103 mmol/L (ref 98–111)
Creatinine, Ser: 0.75 mg/dL (ref 0.44–1.00)
GFR, Estimated: >60 mL/min (ref >60)
Glucose, Bld: 118 mg/dL — ABNORMAL HIGH (ref 70–99)
Potassium: 3.9 mmol/L (ref 3.5–5.1)
Sodium: 136 mmol/L (ref 135–145)

## 2022-06-01 LAB — URINALYSIS, ROUTINE W REFLEX MICROSCOPIC
Bilirubin Urine: NEGATIVE
Glucose, UA: NEGATIVE mg/dL
Hgb urine dipstick: NEGATIVE
Ketones, ur: 5 mg/dL — AB
Leukocytes,Ua: NEGATIVE
Nitrite: NEGATIVE
Protein, ur: NEGATIVE mg/dL
Specific Gravity, Urine: 1.044 — ABNORMAL HIGH (ref 1.005–1.030)
pH: 5 (ref 5.0–8.0)

## 2022-06-01 LAB — CBC
HCT: 34 % — ABNORMAL LOW (ref 36.0–46.0)
HCT: 38.7 % (ref 36.0–46.0)
Hemoglobin: 11.9 g/dL — ABNORMAL LOW (ref 12.0–15.0)
Hemoglobin: 13 g/dL (ref 12.0–15.0)
MCH: 30.3 pg (ref 26.0–34.0)
MCH: 30.8 pg (ref 26.0–34.0)
MCHC: 33.6 g/dL (ref 30.0–36.0)
MCHC: 35 g/dL (ref 30.0–36.0)
MCV: 88.1 fL (ref 80.0–100.0)
MCV: 90.2 fL (ref 80.0–100.0)
Platelets: 212 10*3/uL (ref 150–400)
Platelets: 301 10*3/uL (ref 150–400)
RBC: 3.86 MIL/uL — ABNORMAL LOW (ref 3.87–5.11)
RBC: 4.29 MIL/uL (ref 3.87–5.11)
RDW: 13.3 % (ref 11.5–15.5)
RDW: 13.5 % (ref 11.5–15.5)
WBC: 19.8 10*3/uL — ABNORMAL HIGH (ref 4.0–10.5)
WBC: 20.1 10*3/uL — ABNORMAL HIGH (ref 4.0–10.5)
nRBC: 0 % (ref 0.0–0.2)
nRBC: 0 % (ref 0.0–0.2)

## 2022-06-01 LAB — HEPARIN LEVEL (UNFRACTIONATED): Heparin Unfractionated: >1.1 IU/mL — ABNORMAL HIGH (ref 0.30–0.70)

## 2022-06-01 LAB — LACTIC ACID, PLASMA
Lactic Acid, Venous: 2 mmol/L (ref 0.5–1.9)
Lactic Acid, Venous: 1.6 mmol/L (ref 0.5–1.9)

## 2022-06-01 LAB — LIPASE, BLOOD: Lipase: 44 U/L (ref 11–51)

## 2022-06-01 LAB — PROTIME-INR
INR: 1.6 — ABNORMAL HIGH (ref 0.8–1.2)
Prothrombin Time: 18.6 seconds — ABNORMAL HIGH (ref 11.4–15.2)

## 2022-06-01 LAB — TROPONIN I (HIGH SENSITIVITY)
Troponin I (High Sensitivity): 5 ng/L (ref <18)
Troponin I (High Sensitivity): 5 ng/L (ref <18)

## 2022-06-01 LAB — APTT: aPTT: 43 seconds — ABNORMAL HIGH (ref 24–36)

## 2022-06-01 MED ORDER — ONDANSETRON HCL 4 MG PO TABS
4.0000 mg | ORAL_TABLET | Freq: Four times a day (QID) | ORAL | Status: DC | PRN
  Administered 2022-06-02: 4 mg via ORAL
  Filled 2022-06-01: qty 1

## 2022-06-01 MED ORDER — ONDANSETRON HCL 4 MG/2ML IJ SOLN
4.0000 mg | Freq: Once | INTRAMUSCULAR | Status: AC
  Administered 2022-06-01: 4 mg via INTRAVENOUS
  Filled 2022-06-01: qty 2

## 2022-06-01 MED ORDER — ONDANSETRON HCL 4 MG/2ML IJ SOLN
4.0000 mg | Freq: Four times a day (QID) | INTRAMUSCULAR | Status: DC | PRN
  Administered 2022-06-06: 4 mg via INTRAVENOUS
  Filled 2022-06-01: qty 2

## 2022-06-01 MED ORDER — SODIUM CHLORIDE 0.9 % IV BOLUS
1000.0000 mL | Freq: Once | INTRAVENOUS | Status: AC
  Administered 2022-06-01: 1000 mL via INTRAVENOUS

## 2022-06-01 MED ORDER — PIPERACILLIN-TAZOBACTAM 3.375 G IVPB 30 MIN
3.3750 g | Freq: Once | INTRAVENOUS | Status: AC
Start: 2022-06-01 — End: 2022-06-01
  Administered 2022-06-01: 3.375 g via INTRAVENOUS
  Filled 2022-06-01: qty 50

## 2022-06-01 MED ORDER — LACTATED RINGERS IV SOLN: INTRAVENOUS | Status: DC

## 2022-06-01 MED ORDER — HEPARIN (PORCINE) 25000 UT/250ML-% IV SOLN
900.0000 [IU]/h | INTRAVENOUS | Status: DC
Start: 2022-06-01 — End: 2022-06-03
  Administered 2022-06-01: 750 [IU]/h via INTRAVENOUS
  Administered 2022-06-03: 900 [IU]/h via INTRAVENOUS
  Filled 2022-06-01 (×2): qty 250

## 2022-06-01 MED ORDER — ACETAMINOPHEN 650 MG RE SUPP: 650.0000 mg | Freq: Four times a day (QID) | RECTAL | Status: DC | PRN

## 2022-06-01 MED ORDER — MORPHINE SULFATE (PF) 2 MG/ML IV SOLN: 2.0000 mg | INTRAVENOUS | Status: DC | PRN

## 2022-06-01 MED ORDER — PIPERACILLIN-TAZOBACTAM 3.375 G IVPB
3.3750 g | Freq: Three times a day (TID) | INTRAVENOUS | Status: DC
  Administered 2022-06-01 – 2022-06-03 (×6): 3.375 g via INTRAVENOUS
  Filled 2022-06-01 (×6): qty 50

## 2022-06-01 MED ORDER — IOHEXOL 300 MG/ML  SOLN
100.0000 mL | Freq: Once | INTRAMUSCULAR | Status: AC | PRN
  Administered 2022-06-01: 100 mL via INTRAVENOUS

## 2022-06-01 MED ORDER — ACETAMINOPHEN 325 MG PO TABS
650.0000 mg | ORAL_TABLET | Freq: Four times a day (QID) | ORAL | Status: DC | PRN
  Administered 2022-06-01 – 2022-06-02 (×2): 650 mg via ORAL
  Filled 2022-06-01 (×2): qty 2

## 2022-06-01 NOTE — Consult Note (Addendum)
Cardiology Consultation:   Patient ID: Yolanda Bell MRN: [id number]; DOB: [birth year]  Admit date: 05/31/2022 Date of Consult: 06/01/2022  PCP:  Yolanda Congress, NP   [facility] Providers Cardiologist:  Yolanda Moores, MD      Patient Profile:   Yolanda Bell is a 81 y.o. female with a hx of paroxysmal atrial fibrillation on Xarelto, history of constrictive pericarditis s/p pericardectomy in 2006, HTN, gout who is being seen 06/01/2022 for preoperative evaluation at the request of Yolanda Bell.  History of Present Illness:   Yolanda Bell is a 81 year old female with above medical history who is followed by Yolanda Bell. Per chart review, patient has a history of constrictive pericarditis and underwent pericardial stripping at the [facility] in 2006. She also has a long history of atrial fibrillation and has undergone multiple cardioversions in the past (11/2012, 05/2014, 10/2015, 05/2016). She also has had 2 ablations for atrial fibrillation/flutter, most recently in 2017. After her ablation in 08/2016, she maintained sinus rhythm for a few years.   Patient was admitted to the hospital in 06/2021 for evaluation of syncope. Syncope occurred after she received her COVID booster shot and was thought to be vasovagal syncope. Echocardiogram on 06/23/2021 showed EF 50-55%, no regional wall motion abnormalities, normal LV diastolic parameters, normal RV systolic function, moderately dilated LA, moderate tricuspid valve regurgitation. She was in sinus rhythm that admission.   Patient was last seen by Yolanda Bell with cardiology on 04/06/2022. At that appointment, patient did complain of a fast heart rate and was found to be in atrial fibrillation. Her metoprolol was increased and she was continued on Xarelto. Patient had been intolerant of amiodarone in the past. She underwent cardioversion on 04/09/2022. Patient told Yolanda Bell that she had to have oral surgery in September. In the past when she had similar  oral surgery, she had bleeding complications that started the day after her procedure. Yolanda Bell recommended holding xarelto for a few days after surgery to allow better healing.   She was referred to EP for consideration of a redo ablation, appointment is scheduled for 07/2022.   Patient presented to the ED on 05/31/2022 complaining of lower abdominal pain, nausea, vomiting. Workup found that patient had a giant small bowel diverticulum (of the jejunum) with acute diverticulitis. Patient was seen by general surgery. They plan to have surgery to resect the giant diverticulum once DOAC wears off.   EKG in the ED showed normal sinus rhythm, HR 80 BPM   On interview, patient reports that she has has had afib for over 20 years. She did have her first ablation in 2003, later developed constrictive pericarditis and had pericardial stripping in 2006. She continued to have recurrent afib, had a second ablation in 2017. After that ablation, she maintained sinus rhythm for about 5 and a half years. Had an episode of recurrent afiv in 03/2022, underwent cardioversion. To her knowledge, she has been maintaining sinus rhythm since that time. She did have an episode of palpitations that lasted a few hours, but has not had notable palpitations since then. Denies having any chest pain or sob on exertion. Her activities are somewhat limited due to knee pain, but she is able to walk around the grocery store, walk up a flight of stairs, and complete all of her ADLs without chest pain, sob, dizziness.   Past Medical History:  Diagnosis Date   Atrial fibrillation [facility])    Status post TEE cardioversion   Constrictive  pericarditis    2 status post pericardial stripping at the [facility]   Gout    Hypertension    Spastic dysphonia    Supraventricular tachycardia ([place])    Status post RF ablation by Dr. Peterson Lombard    Past Surgical History:  Procedure Laterality Date   [place]   11/21/2012   Procedure: CARDIOVERSION;  Surgeon: Darlin Coco, MD;  Location: [place];  Service: Cardiovascular;;   CARDIOVERSION N/A 06/10/2014   Procedure: CARDIOVERSION;  Surgeon: Thayer Headings, MD;  Location: [place];  Service: Cardiovascular;  Laterality: N/A;   CARDIOVERSION N/A 10/14/2015   Procedure: CARDIOVERSION;  Surgeon: Josue Hector, MD;  Location: [place];  Service: Cardiovascular;  Laterality: N/A;   CARDIOVERSION N/A 05/12/2016   Procedure: CARDIOVERSION;  Surgeon: Skeet Latch, MD;  Location: [place];  Service: Cardiovascular;  Laterality: N/A;   CARDIOVERSION N/A 04/09/2022   Procedure: CARDIOVERSION;  Surgeon: Elouise Munroe, MD;  Location: [place];  Service: Cardiovascular;  Laterality: N/A;   [place] N/A 08/20/2016   Procedure: Atrial Fibrillation Ablation;  Surgeon: [name], MD;  Location: [place] CV LAB;  Service: Cardiovascular;  Laterality: N/A;   EYE SURGERY     cataracts   PERICARDIECTOMY     TEE WITHOUT CARDIOVERSION N/A 06/10/2014   Procedure: TRANSESOPHAGEAL ECHOCARDIOGRAM (TEE);  Surgeon: Thayer Headings, MD;  Location: [facility] ENDOSCOPY;  Service: Cardiovascular;  Laterality: N/A;   TONSILLECTOMY       Home Medications:  Prior to Admission medications   Medication Sig Start Date End Date Taking? Authorizing Provider  Ascorbic Acid (VITAMIN C PO) Take 1 tablet by mouth daily.   Yes [provider]  Calcium Carb-Cholecalciferol (CALCIUM + VITAMIN D3 PO) Take 1 tablet by mouth daily.   Yes [provider]  DENTA 5000 PLUS 1.1 % CREA dental cream Place 1 application  onto teeth 2 (two) times daily. 05/31/21  Yes [provider]  dextromethorphan (DELSYM) 30 MG/5ML liquid Take 30 mg by mouth at bedtime as needed for cough.   Yes [provider]  indomethacin (INDOCIN) 25 MG capsule Take 25-50 mg by mouth daily as needed (gout flares). For  gout   Yes [provider]  metoprolol succinate (TOPROL-XL) 25 MG 24 hr tablet Take one tablet by mouth (25 mg) twice a day. Patient taking differently: Take 25 mg by mouth See admin instructions. 25 mg twice daily. May take an additional 25 mg as needed for very high blood pressure 04/09/22  Yes Elouise Munroe, MD  potassium chloride (KLOR-CON M10) 10 MEQ tablet Take 1 tablet (10 mEq total) by mouth daily as needed (take with torsemide). 04/09/22  Yes Elouise Munroe, MD  propranolol (INDERAL) 10 MG tablet TAKE 1 TABLET BY MOUTH 4 TIMES A DAY AS NEEDED Patient taking differently: Take 10 mg by mouth 4 (four) times daily as needed (HR > 120). 04/30/22  Yes Nahser, Wonda Cheng, MD  spironolactone (ALDACTONE) 50 MG tablet TAKE 1 TABLET BY MOUTH EVERY DAY Patient taking differently: Take 50 mg by mouth daily. 02/17/22  Yes Nahser, Wonda Cheng, MD  torsemide (DEMADEX) 20 MG tablet Take 1 tablet (20 mg total) by mouth daily as needed (Take if your weight increases more than 2 pounds in 24 hours.). Patient taking differently: Take 20 mg by mouth daily as needed (weight gain > 2 lbs in  24 hours). 06/23/21  Yes Regalado, Belkys A, MD  XARELTO 20 MG TABS tablet TAKE 1 TABLET BY MOUTH DAILY WITH SUPPER Patient taking differently: Take 20 mg by mouth daily with supper. 04/05/22  Yes Nahser, Wonda Cheng, MD  denosumab (PROLIA) 60 MG/ML SOSY injection Inject 60 mg into the skin every 6 (six) months. Last dose End of may early april Patient not taking: Reported on 06/01/2022    [provider]    Inpatient Medications: Scheduled Meds:  Continuous Infusions:  heparin     lactated ringers 75 mL/hr at 06/01/22 1021   piperacillin-tazobactam (ZOSYN)  IV 3.375 g (06/01/22 1133)   PRN Meds: acetaminophen **OR** acetaminophen, morphine injection, ondansetron **OR** ondansetron (ZOFRAN) IV  Allergies:    Allergies  Allergen Reactions   Corticosteroids Other (See Comments)    Liver scarring as a  child   Covid-19 (Mrna) Vaccine Other (See Comments)    Postural Orthostatic Tachycardia Syndrome (POTS)   Other Other (See Comments)    Cannot take STEROIDS-unknown reaction-caused by internal scarring on liver.    Anesthesia precaution due to excessive liver scarring    Pacerone [Amiodarone] Other (See Comments)    Dizziness Visual hallucinations   Erythromycin Nausea And Vomiting and Rash   Zyloprim [Allopurinol] Rash and Other (See Comments)    Skin peeling More severe gout     Social History:   Social History   Socioeconomic History   Marital status: Divorced    Spouse name: Not on file   Number of children: Not on file   Years of education: Not on file   Highest education level: Not on file  Occupational History   Not on file  Tobacco Use   Smoking status: Never   Smokeless tobacco: Never  Vaping Use   Vaping Use: Never used  Substance and Sexual Activity   Alcohol use: Yes    Comment: occasionally   Drug use: No   Sexual activity: Not on file  Other Topics Concern   Not on file  Social History Narrative   Not on file   Social Determinants of Health   Financial Resource Strain: Not on file  Food Insecurity: Not on file  Transportation Needs: Not on file  Physical Activity: Not on file  Stress: Not on file  Social Connections: Not on file  Intimate Partner Violence: Not on file    Family History:    Family History  Problem Relation Age of Onset   Heart disease Mother    Emphysema Father    Breast cancer Sister      ROS:  Please see the history of present illness.   All other ROS reviewed and negative.     Physical Exam/Data:   Vitals:   06/01/22 1000 06/01/22 1015 06/01/22 1023 06/01/22 1030  BP: (!) 97/56 (!) 103/58  99/68  Pulse: 90 89 93 85  Resp: (!) 22 19 (!) 22 (!) 21  Temp:   99.2 F (37.3 C)   TempSrc:   Oral   SpO2: 96% 96% 98% 96%  Weight:      Height:        Intake/Output Summary (Last 24 hours) at 06/01/2022 1245 Last  data filed at 06/01/2022 0408 Gross per 24 hour  Intake 1050 ml  Output 400 ml  Net 650 ml      05/31/2022   11:32 PM 05/22/2022    6:54 PM 04/09/2022    7:11 AM  Last 3 Weights  Weight (lbs) 139  lb 137 lb 141 lb  Weight (kg) 63.05 kg 62.143 kg 63.957 kg     Body mass index is 25.42 kg/m.  General:  Well nourished, well developed. Appears uncomfortable, but is in no acute distress  HEENT: normal Neck: no JVD Vascular: No carotid bruits; Radial pulses 2+ bilaterally Cardiac:  normal S1, S2; RRR; grade 2/6 systolic murmur at left sternal border Lungs:  clear to auscultation bilaterally, no wheezing, rhonchi or rales  Abd: soft, nontender, no hepatomegaly  Ext: no edema Musculoskeletal:  No deformities, BUE and BLE strength normal and equal Skin: warm and dry  Neuro:  CNs 2-12 intact, no focal abnormalities noted Psych:  Normal affect   EKG:  The EKG was personally reviewed and demonstrates:  sinus rhythm, HR 80 BPM Telemetry:  Telemetry was personally reviewed and demonstrates:  Sinus rhythm with infrequent PVCs  Relevant CV Studies:   Laboratory Data:  High Sensitivity Troponin:   Recent Labs  Lab 05/31/22 2344 06/01/22 0202  TROPONINIHS 5 5     Chemistry Recent Labs  Lab 05/31/22 2344 06/01/22 0424  NA 135 136  K 3.5 3.9  CL 100 103  CO2 24 23  GLUCOSE 120* 118*  BUN 8 8  CREATININE 0.81 0.75  CALCIUM 9.0 8.5*  GFRNONAA >60 >60  ANIONGAP 11 10    Recent Labs  Lab 05/31/22 2344  PROT 6.4*  ALBUMIN 3.8  AST 18  ALT 12  ALKPHOS 53  BILITOT 0.5   Lipids No results for input(s): "CHOL", "TRIG", "HDL", "LABVLDL", "LDLCALC", "CHOLHDL" in the last 168 hours.  Hematology Recent Labs  Lab 05/31/22 2344 06/01/22 0424  WBC 19.8* 20.1*  RBC 4.29 3.86*  HGB 13.0 11.9*  HCT 38.7 34.0*  MCV 90.2 88.1  MCH 30.3 30.8  MCHC 33.6 35.0  RDW 13.3 13.5  PLT 301 212   Thyroid No results for input(s): "TSH", "FREET4" in the last 168 hours.  BNPNo results for  input(s): "BNP", "PROBNP" in the last 168 hours.  DDimer No results for input(s): "DDIMER" in the last 168 hours.   Radiology/Studies:  CT ABDOMEN PELVIS W CONTRAST  Result Date: 06/01/2022 CLINICAL DATA:  Abdominal pain, acute, nonlocalized. Nausea, vomiting. Urinary hesitancy. EXAM: CT ABDOMEN AND PELVIS WITH CONTRAST TECHNIQUE: Multidetector CT imaging of the abdomen and pelvis was performed using the standard protocol following bolus administration of intravenous contrast. RADIATION DOSE REDUCTION: This exam was performed according to the departmental dose-optimization program which includes automated exposure control, adjustment of the mA and/or kV according to patient size and/or use of iterative reconstruction technique. CONTRAST:  162m OMNIPAQUE IOHEXOL 300 MG/ML  SOLN COMPARISON:  06/20/2021 FINDINGS: Lower chest: Stable 8 mm left lower lobe pulmonary nodule since remote prior examination of 12/25/2004, benign. Mild cardiomegaly. Pericardial calcifications are again identified. Small hiatal hernia. Hepatobiliary: Status post cholecystectomy. Mild intra and extrahepatic biliary ductal dilation is stable in keeping with post cholecystectomy change. Liver unremarkable. Pancreas: Unremarkable Spleen: Unremarkable Adrenals/Urinary Tract: The adrenal glands are unremarkable. The kidneys are normal in size and position. A 16 mm enhancing nodule arises exophytically from the interpolar region the left kidney most in keeping with a primary renal cell carcinoma. This appears stable in size since prior examination. No hydronephrosis. No intrarenal or ureteral calculi. The bladder is unremarkable. Stomach/Bowel: There is a 5.3 x 7.3 x 6.2 cm debris and gas filled collection within the left mid abdomen demonstrating communication with a a loop of mid jejunum at axial image # 37/3 most  in keeping with a giant small-bowel diverticulum. This was likely present on prior examination on image # 39/3 of the prior exam  but has significantly increased in size since prior examination, where this measured 2.5 cm, and demonstrates significantly increased intraluminal debris. There is now extensive surrounding inflammatory change as well as thickening of the adjacent loop of small bowel in keeping with changes of superimposed small bowel diverticulitis. There is severe descending and sigmoid colonic diverticulosis. There is a superimposed focus of acute inflammatory change involving the mid sigmoid colon best seen on axial image # 70/3 in keeping with mild superimposed acute sigmoid diverticulitis. The inflammatory changes abut the bladder. No evidence of obstruction or perforation. The stomach, small bowel, and large bowel are otherwise unremarkable. No free intraperitoneal gas. Small free fluid within the pelvis and within the perihepatic region Vascular/Lymphatic: Aortic atherosclerosis. No enlarged abdominal or pelvic lymph nodes. Reproductive: Status post hysterectomy. No adnexal masses. Other: Tiny fat containing umbilical hernia Musculoskeletal: Severe compression deformity of L5 with 80% loss of height and retropulsion of the posterior aspect of the vertebral body appears stable. Marked resultant central canal stenosis again noted. No acute bone abnormality. IMPRESSION: 1. Giant small-bowel diverticulum within the left mid abdomen demonstrating communication with a loop of mid jejunum. This has significantly increased in size since prior examination and demonstrates significantly increased intraluminal debris. There is now extensive surrounding inflammatory change as well as thickening of the adjacent loop of small bowel in keeping with superimposed small bowel diverticulitis. No evidence of obstruction or perforation. 2. Superimposed mild acute sigmoid diverticulitis. No evidence of obstruction or perforation. Inflammatory changes abut the bladder and may account for the patient's urinary symptoms. 3. Stable 16 mm enhancing  nodule within the left kidney most in keeping with a primary renal cell carcinoma. This should be confirmed with dedicated renal mass protocol CT or MRI examination once the patient's acute issues have resolved. 4. Stable severe compression deformity of L5 with 80% loss of height and retropulsion of the posterior aspect of the vertebral body. Marked resultant central canal stenosis. 5. Mild cardiomegaly. Pericardial calcifications again noted. Aortic Atherosclerosis (ICD10-I70.0). Electronically Signed   By: Fidela Salisbury M.D.   On: 06/01/2022 02:21     Assessment and Plan:   Preoperative Evaluation  - Patient is a 81 year old female with a past medical history of paroxysmal atrial fibrillation, history of constrictive pericarditis s/p pericardial stripping in 2006 - Patient is maintaining NSR per telemetry  - She denies ever having a heart attack or being told she has heart failure. Denies having chest pain or sob on exertion - Her activities are somewhat limited by knee pain, but patient is able to go grocery stopping by herself, climb a flight of stairs, and complete her ADLs without chest pain, sob - Echo from 06/2021 showed EF 50-55%, no regional wall motion abnormalities, normal LV diastolic parameters, normal V systolic function, normal pulmonary artery systolic pressure, moderately dilated LA, moderate tricuspid regurgitation - According to the revised cardiac risk index, patient is a class II risk, or has a 6.00% 30-day risk of death, mi, or cardiac arrest - Dr. Gasper Sells to see. I do not anticipate any further cardiac workup prior to surgeryl.   Paroxysmal Atrial Fibrillation  - s/p ablation in 2017. After ablation, patient maintained sinus rhythm for about 5.5 years. Did have recurrence in 03/2022 and underwent cardioversion  - Patient reports one possible episode of atrial fibrillation, but has otherwise been maintaining sinus  rhythm. Maintaining sinus rhythm per telemetry  -  CHADS-VASc 4 (age x2, gender, HTN) - Holding Xarelto perioperatively, agree with IV heparin  - Continue home metoprolol 25 mg BID when BP can tolerate  - Patient has an appointment with EP in 07/2022 to discuss possible redo ablation, encouraged patient to keep that appointment   Risk Assessment/Risk Scores:    CHA2DS2-VASc Score = 4   This indicates a 4.8% annual risk of stroke. The patient's score is based upon: CHF History: 0 HTN History: 1 Diabetes History: 0 Stroke History: 0 Vascular Disease History: 0 Age Score: 2 Gender Score: 1        For questions or updates, please contact [place] Please consult [email or web address] for contact info under    Signed, Margie Billet, PA-C  06/01/2022 12:45 PM   Personally seen and examined. Agree with APP above with the following comments: Briefly 82 yo F with history of PAF on on Xarelto history of constrictive pericarditis s/p pericardectomy; mild non obstructive CAD in circa 2017 who presents for pre-operative risk assessment  Patient notes that she is doing well.  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  [place]long term).  She has no issues with DOAC and did well after her first ablation; she has a DCCV in June and is in [place].  She has a re-peat ablation consultation 07/2022.  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  She has prior pericardiectomy with no residual diastolic dysfunction.   Found to have diverticulitis and a large diverticulum; she has plans for surgery once her DOAC hs been off for 48 hours.   Exam notable for RRR, systolic murmur.  Abdominal tenderness EKG SR Rate 80 Tele: SR with PVCs Personally reviewed relevant tests;  Mild non obstructive CAD on CT-PV; has not needed ischemic eval  Would recommend  -  The Revised Cardiac Risk Index = 1 due to  mild CAD which equates to 0.9% estimated risk of perioperative myocardial infarction, pulmonary edema, ventricular fibrillation, cardiac arrest, or complete heart block.  -Greater that 4 functional mets - No further cardiac testing is recommended prior to surgery.  - can hold AC as needed; and continue his BB - she has high risk of having post surgical atrial fibrillation; give her BP we may have to re-initated [facility] post surgery, using digoxin, or attempt short course of IV amiodarone - we will peripherally follow; if no post operative Afib will arrange outpatient f/u  Rudean Haskell, MD [address] [place], [place] [id number] [phone]  3:36 PM

## 2022-06-01 NOTE — Progress Notes (Signed)
Yolanda Bell is a 81 y.o. female with medical history significant of A.Fib, HTN, on Xarelto presents with  c/o diffuse abd pain, nausea.  CT abd and pelvis showed Giant small-bowel diverticulum within the left mid abdomen demonstrating communication with a loop of mid jejunum.  Gen surgery consulted for further recommendations.  Pt admitted by Dr Alcario Drought earlier this am, please see note for details.  Continue to monitor.    Hosie Poisson, MD

## 2022-06-01 NOTE — Consult Note (Signed)
Reason for Consult:ab pain  Referring Physician: Dr Dorisann Frames is an 81 y.o. female.  HPI:81  yof with history afib on xarelto, prior pericarditis with stripping who presents with acute onset of diffuse ab pain at 6 pm last night. She had n/v, difficulty urinating.  Has had bowel function yesterday. No fever.  She took xarelto last night. She has recently had covid also.  Was due to see ep for another possible ablation in next couple months also.  She has had prior open chole and hysterectomy via low midline I reviewed cbc, bmet, ct independently. Discussed case with er provider as well  Past Medical History:  Diagnosis Date   Atrial fibrillation ([place])    Status post TEE cardioversion   Constrictive pericarditis    2 status post pericardial stripping at the [facility]   Gout    Hypertension    Spastic dysphonia    Supraventricular tachycardia ([place])    Status post RF ablation by Dr. Peterson Lombard    Past Surgical History:  Procedure Laterality Date   [place]  11/21/2012   Procedure: CARDIOVERSION;  Surgeon: Darlin Coco, MD;  Location: [place];  Service: Cardiovascular;;   CARDIOVERSION N/A 06/10/2014   Procedure: CARDIOVERSION;  Surgeon: Thayer Headings, MD;  Location: [place];  Service: Cardiovascular;  Laterality: N/A;   CARDIOVERSION N/A 10/14/2015   Procedure: CARDIOVERSION;  Surgeon: Josue Hector, MD;  Location: [facility] ENDOSCOPY;  Service: Cardiovascular;  Laterality: N/A;   CARDIOVERSION N/A 05/12/2016   Procedure: CARDIOVERSION;  Surgeon: Skeet Latch, MD;  Location: [facility] ENDOSCOPY;  Service: Cardiovascular;  Laterality: N/A;   CARDIOVERSION N/A 04/09/2022   Procedure: CARDIOVERSION;  Surgeon: Elouise Munroe, MD;  Location: [facility] ENDOSCOPY;  Service: Cardiovascular;  Laterality: N/A;   [place] N/A 08/20/2016   Procedure: Atrial Fibrillation Ablation;  Surgeon: [name], MD;  Location: [facility];  Service: Cardiovascular;  Laterality: N/A;   EYE SURGERY     cataracts   PERICARDIECTOMY     TEE WITHOUT CARDIOVERSION N/A 06/10/2014   Procedure: TRANSESOPHAGEAL ECHOCARDIOGRAM (TEE);  Surgeon: Thayer Headings, MD;  Location: [facility] ENDOSCOPY;  Service: Cardiovascular;  Laterality: N/A;   TONSILLECTOMY      Family History  Problem Relation Age of Onset   Heart disease Mother    Emphysema Father    Breast cancer Sister     Social History:  reports that she has never smoked. She has never used smokeless tobacco. She reports current alcohol use. She reports that she does not use drugs.  Allergies:  Allergies  Allergen Reactions   Corticosteroids Other (See Comments)    Liver scarring as a child   Covid-19 (Mrna) Vaccine Other (See Comments)    Postural Orthostatic Tachycardia Syndrome (POTS)   Other Other (See Comments)    Cannot take STEROIDS-unknown reaction-caused by internal scarring on liver.    Anesthesia precaution due to excessive liver scarring    Pacerone [Amiodarone] Other (See Comments)    Dizziness Visual hallucinations   Erythromycin Nausea And Vomiting and Rash   Zyloprim [Allopurinol] Rash and Other (See Comments)    Skin peeling More severe gout     No current facility-administered medications on file prior to encounter.   Current Outpatient Medications on File Prior to Encounter  Medication Sig Dispense Refill   acetaminophen (TYLENOL) 500 MG tablet Take 1,000  mg by mouth every 6 (six) hours as needed for mild pain.     amoxicillin-clavulanate (AUGMENTIN) 875-125 MG tablet Take 1 tablet by mouth 2 (two) times daily. 10 day course.     Ascorbic Acid (VITAMIN C) 500 MG CAPS Take 500 mg by mouth daily.     Calcium Carb-Cholecalciferol (CALCIUM 600+D) 600-20 MG-MCG TABS Take 1 tablet by mouth daily.     denosumab (PROLIA) 60 MG/ML SOSY injection Inject 60 mg into the skin every 6 (six) months. Last dose End of may early  april     DENTA 5000 PLUS 1.1 % CREA dental cream Place 1 application  onto teeth 2 (two) times daily.     indomethacin (INDOCIN) 25 MG capsule Take 25 mg by mouth as needed (gout). For gout     LAGEVRIO 200 MG CAPS capsule Take 4 capsules by mouth every 12 (twelve) hours. 5 day course.     metoprolol succinate (TOPROL-XL) 25 MG 24 hr tablet Take one tablet by mouth (25 mg) twice a day. 180 tablet 3   potassium chloride (KLOR-CON M10) 10 MEQ tablet Take 1 tablet (10 mEq total) by mouth daily as needed (take with torsemide). 90 tablet 3   propranolol (INDERAL) 10 MG tablet TAKE 1 TABLET BY MOUTH 4 TIMES A DAY AS NEEDED (Patient taking differently: Take 10 mg by mouth 4 (four) times daily as needed.) 120 tablet 10   spironolactone (ALDACTONE) 50 MG tablet TAKE 1 TABLET BY MOUTH EVERY DAY (Patient taking differently: Take 50 mg by mouth daily.) 90 tablet 2   torsemide (DEMADEX) 20 MG tablet Take 1 tablet (20 mg total) by mouth daily as needed (Take if your weight increases more than 2 pounds in 24 hours.). 90 tablet 3   XARELTO 20 MG TABS tablet TAKE 1 TABLET BY MOUTH DAILY WITH SUPPER (Patient taking differently: Take 20 mg by mouth daily with supper.) 90 tablet 1     Results for orders placed or performed during the hospital encounter of 05/31/22 (from the past 48 hour(s))  CBC     Status: Abnormal   Collection Time: 05/31/22 11:44 PM  Result Value Ref Range   WBC 19.8 (H) 4.0 - 10.5 K/uL   RBC 4.29 3.87 - 5.11 MIL/uL   Hemoglobin 13.0 12.0 - 15.0 g/dL   HCT 38.7 36.0 - 46.0 %   MCV 90.2 80.0 - 100.0 fL   MCH 30.3 26.0 - 34.0 pg   MCHC 33.6 30.0 - 36.0 g/dL   RDW 13.3 11.5 - 15.5 %   Platelets 301 150 - 400 K/uL   nRBC 0.0 0.0 - 0.2 %    Comment: Performed at [facility], [place] [address] [place], [place] [address]  Comprehensive metabolic panel     Status: Abnormal   Collection Time: 05/31/22 11:44 PM  Result Value Ref Range   Sodium 135 135 - 145 mmol/L   Potassium 3.5 3.5 -  5.1 mmol/L   Chloride 100 98 - 111 mmol/L   CO2 24 22 - 32 mmol/L   Glucose, Bld 120 (H) 70 - 99 mg/dL    Comment: Glucose reference range applies only to samples taken after fasting for at least 8 hours.   BUN 8 8 - 23 mg/dL   Creatinine, Ser 0.81 0.44 - 1.00 mg/dL   Calcium 9.0 8.9 - 10.3 mg/dL   Total Protein 6.4 (L) 6.5 - 8.1 g/dL   Albumin 3.8 3.5 - 5.0 g/dL   AST  18 15 - 41 U/L   ALT 12 0 - 44 U/L   Alkaline Phosphatase 53 38 - 126 U/L   Total Bilirubin 0.5 0.3 - 1.2 mg/dL   GFR, Estimated >60 >60 mL/min    Comment: (NOTE) Calculated using the CKD-EPI Creatinine Equation (2021)    Anion gap 11 5 - 15    Comment: Performed at [facility] [address], [place], [place] [address]  Lipase, blood     Status: None   Collection Time: 05/31/22 11:44 PM  Result Value Ref Range   Lipase 44 11 - 51 U/L    Comment: Performed at [facility] [address], [place], [place] [address]  Protime-INR     Status: Abnormal   Collection Time: 05/31/22 11:44 PM  Result Value Ref Range   Prothrombin Time 18.6 (H) 11.4 - 15.2 seconds   INR 1.6 (H) 0.8 - 1.2    Comment: (NOTE) INR goal varies based on device and disease states. Performed at [facility], [place] [address] [place], [place] [address]   Troponin I (High Sensitivity)     Status: None   Collection Time: 05/31/22 11:44 PM  Result Value Ref Range   Troponin I (High Sensitivity) 5 <18 ng/L    Comment: (NOTE) Elevated high sensitivity troponin I (hsTnI) values and significant  changes across serial measurements may suggest ACS but many other  chronic and acute conditions are known to elevate hsTnI results.  Refer to the "Links" section for chest pain algorithms and additional  guidance. Performed at [facility], [place] [address] [place], [place] [address]   Lactic acid, plasma     Status: Abnormal   Collection Time: 06/01/22 12:14 AM  Result Value Ref Range   Lactic Acid, Venous 2.0 (HH) 0.5 - 1.9  mmol/L    Comment: CRITICAL RESULT CALLED TO, READ BACK BY AND VERIFIED WITH ASHLEY J. RN AT [phone] BY RAMEL CUENCA Performed at [facility], [place] [address], [place], [place] [address]   Troponin I (High Sensitivity)     Status: None   Collection Time: 06/01/22  2:02 AM  Result Value Ref Range   Troponin I (High Sensitivity) 5 <18 ng/L    Comment: (NOTE) Elevated high sensitivity troponin I (hsTnI) values and significant  changes across serial measurements may suggest ACS but many other  chronic and acute conditions are known to elevate hsTnI results.  Refer to the "Links" section for chest pain algorithms and additional  guidance. Performed at [facility], [place] [address] [place], [place] [address]   Urinalysis, Routine w reflex microscopic Urine, Catheterized     Status: Abnormal   Collection Time: 06/01/22  2:43 AM  Result Value Ref Range   Color, Urine YELLOW YELLOW   APPearance CLEAR CLEAR   Specific Gravity, Urine 1.044 (H) 1.005 - 1.030   pH 5.0 5.0 - 8.0   Glucose, UA NEGATIVE NEGATIVE mg/dL   Hgb urine dipstick NEGATIVE NEGATIVE   Bilirubin Urine NEGATIVE NEGATIVE   Ketones, ur 5 (A) NEGATIVE mg/dL   Protein, ur NEGATIVE NEGATIVE mg/dL   Nitrite NEGATIVE NEGATIVE   Leukocytes,Ua NEGATIVE NEGATIVE    Comment: Performed at [facility] [address] [place], [place] [address]  Lactic acid, plasma     Status: None   Collection Time: 06/01/22  3:00 AM  Result Value Ref Range   Lactic Acid, Venous 1.6 0.5 -  1.9 mmol/L    Comment: Performed at [facility], [place] [address], [place], [place] [address]  CBC     Status: Abnormal   Collection Time: 06/01/22  4:24 AM  Result Value Ref Range   WBC 20.1 (H) 4.0 - 10.5 K/uL   RBC 3.86 (L) 3.87 - 5.11 MIL/uL   Hemoglobin 11.9 (L) 12.0 - 15.0 g/dL   HCT 34.0 (L) 36.0 - 46.0 %   MCV 88.1 80.0 - 100.0 fL   MCH 30.8 26.0 - 34.0 pg   MCHC 35.0 30.0 - 36.0 g/dL   RDW 13.5 11.5 - 15.5 %   Platelets  212 150 - 400 K/uL   nRBC 0.0 0.0 - 0.2 %    Comment: Performed at [facility], [place] [address], [place], [place] [address]  Basic metabolic panel     Status: Abnormal   Collection Time: 06/01/22  4:24 AM  Result Value Ref Range   Sodium 136 135 - 145 mmol/L   Potassium 3.9 3.5 - 5.1 mmol/L   Chloride 103 98 - 111 mmol/L   CO2 23 22 - 32 mmol/L   Glucose, Bld 118 (H) 70 - 99 mg/dL    Comment: Glucose reference range applies only to samples taken after fasting for at least 8 hours.   BUN 8 8 - 23 mg/dL   Creatinine, Ser 0.75 0.44 - 1.00 mg/dL   Calcium 8.5 (L) 8.9 - 10.3 mg/dL   GFR, Estimated >60 >60 mL/min    Comment: (NOTE) Calculated using the CKD-EPI Creatinine Equation (2021)    Anion gap 10 5 - 15    Comment: Performed at [facility] [address], [place], [place] [address]  Heparin level (unfractionated)     Status: Abnormal   Collection Time: 06/01/22  5:27 AM  Result Value Ref Range   Heparin Unfractionated >1.10 (H) 0.30 - 0.70 IU/mL    Comment: (NOTE) The clinical reportable range upper limit is being lowered to >1.10 to align with the FDA approved guidance for the current laboratory assay.  If heparin results are below expected values, and patient dosage has  been confirmed, suggest follow up testing of antithrombin III levels. Performed at [facility], [place] [address] [place], [place] [address]   APTT     Status: Abnormal   Collection Time: 06/01/22  5:27 AM  Result Value Ref Range   aPTT 43 (H) 24 - 36 seconds    Comment:        IF BASELINE aPTT IS ELEVATED, SUGGEST PATIENT RISK ASSESSMENT BE USED TO DETERMINE APPROPRIATE ANTICOAGULANT THERAPY. Performed at [facility], [place] [address] [place], [place] [address]     CT ABDOMEN PELVIS W CONTRAST  Result Date: 06/01/2022 CLINICAL DATA:  Abdominal pain, acute, nonlocalized. Nausea, vomiting. Urinary hesitancy. EXAM: CT ABDOMEN AND PELVIS WITH CONTRAST TECHNIQUE: Multidetector  CT imaging of the abdomen and pelvis was performed using the standard protocol following bolus administration of intravenous contrast. RADIATION DOSE REDUCTION: This exam was performed according to the departmental dose-optimization program which includes automated exposure control, adjustment of the mA and/or kV according to patient size and/or use of iterative reconstruction technique. CONTRAST:  113m OMNIPAQUE IOHEXOL 300 MG/ML  SOLN COMPARISON:  06/20/2021 FINDINGS: Lower chest: Stable 8 mm left lower lobe pulmonary nodule since remote prior examination of 12/25/2004, benign. Mild cardiomegaly. Pericardial calcifications are again identified. Small hiatal hernia. Hepatobiliary: Status post cholecystectomy. Mild intra and extrahepatic  biliary ductal dilation is stable in keeping with post cholecystectomy change. Liver unremarkable. Pancreas: Unremarkable Spleen: Unremarkable Adrenals/Urinary Tract: The adrenal glands are unremarkable. The kidneys are normal in size and position. A 16 mm enhancing nodule arises exophytically from the interpolar region the left kidney most in keeping with a primary renal cell carcinoma. This appears stable in size since prior examination. No hydronephrosis. No intrarenal or ureteral calculi. The bladder is unremarkable. Stomach/Bowel: There is a 5.3 x 7.3 x 6.2 cm debris and gas filled collection within the left mid abdomen demonstrating communication with a a loop of mid jejunum at axial image # 37/3 most in keeping with a giant small-bowel diverticulum. This was likely present on prior examination on image # 39/3 of the prior exam but has significantly increased in size since prior examination, where this measured 2.5 cm, and demonstrates significantly increased intraluminal debris. There is now extensive surrounding inflammatory change as well as thickening of the adjacent loop of small bowel in keeping with changes of superimposed small bowel diverticulitis. There is severe  descending and sigmoid colonic diverticulosis. There is a superimposed focus of acute inflammatory change involving the mid sigmoid colon best seen on axial image # 70/3 in keeping with mild superimposed acute sigmoid diverticulitis. The inflammatory changes abut the bladder. No evidence of obstruction or perforation. The stomach, small bowel, and large bowel are otherwise unremarkable. No free intraperitoneal gas. Small free fluid within the pelvis and within the perihepatic region Vascular/Lymphatic: Aortic atherosclerosis. No enlarged abdominal or pelvic lymph nodes. Reproductive: Status post hysterectomy. No adnexal masses. Other: Tiny fat containing umbilical hernia Musculoskeletal: Severe compression deformity of L5 with 80% loss of height and retropulsion of the posterior aspect of the vertebral body appears stable. Marked resultant central canal stenosis again noted. No acute bone abnormality. IMPRESSION: 1. Giant small-bowel diverticulum within the left mid abdomen demonstrating communication with a loop of mid jejunum. This has significantly increased in size since prior examination and demonstrates significantly increased intraluminal debris. There is now extensive surrounding inflammatory change as well as thickening of the adjacent loop of small bowel in keeping with superimposed small bowel diverticulitis. No evidence of obstruction or perforation. 2. Superimposed mild acute sigmoid diverticulitis. No evidence of obstruction or perforation. Inflammatory changes abut the bladder and may account for the patient's urinary symptoms. 3. Stable 16 mm enhancing nodule within the left kidney most in keeping with a primary renal cell carcinoma. This should be confirmed with dedicated renal mass protocol CT or MRI examination once the patient's acute issues have resolved. 4. Stable severe compression deformity of L5 with 80% loss of height and retropulsion of the posterior aspect of the vertebral body. Marked  resultant central canal stenosis. 5. Mild cardiomegaly. Pericardial calcifications again noted. Aortic Atherosclerosis (ICD10-I70.0). Electronically Signed   By: Fidela Salisbury M.D.   On: 06/01/2022 02:21    Review of Systems  Constitutional:  Negative for fever.  Respiratory:  Positive for cough.   Gastrointestinal:  Positive for abdominal distention, abdominal pain, nausea and vomiting.  Genitourinary:  Positive for hematuria.  All other systems reviewed and are negative.  Blood pressure (!) 110/54, pulse 81, temperature 99 F (37.2 C), temperature source Oral, resp. rate (!) 21, height '5\' 2"'$  (1.575 m), weight 63 kg, SpO2 95 %. Physical Exam Vitals reviewed.  Constitutional:      General: She is not in acute distress.    Appearance: She is well-developed.  Eyes:     General: No scleral icterus.  Pupils: Pupils are equal, round, and reactive to light.  Cardiovascular:     Rate and Rhythm: Normal rate.  Pulmonary:     Effort: Pulmonary effort is normal.  Abdominal:     General: There is no distension.    Skin:    General: Skin is warm and dry.     Capillary Refill: Capillary refill takes less than 2 seconds.  Neurological:     General: No focal deficit present.     Mental Status: She is alert.  Psychiatric:        Mood and Affect: Mood normal.     Assessment/Plan: Jejunal diverticulitis in giant diverticulum -abx, npo, hold xarelto -likely needs to have surgery to resect this once doac wears off. Discussed sbr with her.    Rolm Bookbinder 06/01/2022, 7:57 AM

## 2022-06-01 NOTE — ED Notes (Signed)
Walked patient to the bathroom patient did well, changed patient sheets placed a brief got patient some warm blankets patient is resting with call bell in reach took patient saline lock out of her right [facility]

## 2022-06-01 NOTE — ED Notes (Signed)
ED TO INPATIENT HANDOFF REPORT  ED Nurse Name and Phone #: Stanton Kidney, RN  S Name/Age/Gender Yolanda Bell 81 y.o. female Room/Bed: 040C/040C  Code Status   Code Status: Full Code  Home/SNF/Other Home Patient oriented to: self, place, time, and situation Is this baseline? Yes   Triage Complete: Triage complete  Chief Complaint Diverticulitis of small bowel [K57.12]  Triage Note Pt BIB EMS from home for lower abd pain and N/V. Pt states she does not remember the last time that she peed. Pt has felt the urge but has not been able to urinate.    Allergies Allergies  Allergen Reactions   Corticosteroids Other (See Comments)    Liver scarring as a child   Covid-19 (Mrna) Vaccine Other (See Comments)    Postural Orthostatic Tachycardia Syndrome (POTS)   Other Other (See Comments)    Cannot take STEROIDS-unknown reaction-caused by internal scarring on liver.    Anesthesia precaution due to excessive liver scarring    Pacerone [Amiodarone] Other (See Comments)    Dizziness Visual hallucinations   Erythromycin Nausea And Vomiting and Rash   Zyloprim [Allopurinol] Rash and Other (See Comments)    Skin peeling More severe gout     Level of Care/Admitting Diagnosis ED Disposition     ED Disposition  Admit   Condition  --   [facility]: [place] [[id number]]  Level of Care: Telemetry Medical [104]  May place patient in observation at [facility] or [place] if equivalent level of care is available:: No  Covid Evaluation: Asymptomatic - no recent exposure (last 10 days) testing not required  Diagnosis: Diverticulitis of small bowel [253664]  Admitting Physician: Etta Quill [[id number]]  Attending Physician: Etta Quill [[id number]]          B Medical/Surgery History Past Medical History:  Diagnosis Date   Atrial fibrillation ([place])    Status post TEE cardioversion   Constrictive pericarditis    2 status post pericardial stripping at  the [facility]   Gout    Hypertension    Spastic dysphonia    Supraventricular tachycardia ([place])    Status post RF ablation by Dr. Peterson Lombard   Past Surgical History:  Procedure Laterality Date   [place]  11/21/2012   Procedure: CARDIOVERSION;  Surgeon: Darlin Coco, MD;  Location: [place];  Service: Cardiovascular;;   CARDIOVERSION N/A 06/10/2014   Procedure: CARDIOVERSION;  Surgeon: Thayer Headings, MD;  Location: [place];  Service: Cardiovascular;  Laterality: N/A;   CARDIOVERSION N/A 10/14/2015   Procedure: CARDIOVERSION;  Surgeon: Josue Hector, MD;  [facility];  Service: Cardiovascular;  Laterality: N/A;   CARDIOVERSION N/A 05/12/2016   Procedure: CARDIOVERSION;  Surgeon: Skeet Latch, MD;  Location: [facility] ENDOSCOPY;  Service: Cardiovascular;  Laterality: N/A;   CARDIOVERSION N/A 04/09/2022   Procedure: CARDIOVERSION;  Surgeon: Elouise Munroe, MD;  Location: [place];  Service: Cardiovascular;  Laterality: N/A;   [place] N/A 08/20/2016   Procedure: Atrial Fibrillation Ablation;  Surgeon: [name], MD;  Location: [place] [facility];  Service: Cardiovascular;  Laterality: N/A;   EYE SURGERY     cataracts   PERICARDIECTOMY     TEE WITHOUT CARDIOVERSION N/A 06/10/2014   Procedure: TRANSESOPHAGEAL ECHOCARDIOGRAM (TEE);  Surgeon: Thayer Headings, MD;  Location: [place];  Service: Cardiovascular;  Laterality: N/A;   TONSILLECTOMY  A IV Location/Drains/Wounds Patient Lines/Drains/Airways Status     Active Line/Drains/Airways     Name Placement date Placement time Site Days   Peripheral IV 05/31/22 20 G Anterior;Left Forearm 05/31/22  2332  Forearm  1   Peripheral IV 05/31/22 20 G Right Antecubital 05/31/22  2345  Antecubital  1            Intake/Output Last 24 hours  Intake/Output Summary (Last 24 hours) at 06/01/2022 1953 Last data filed  at 06/01/2022 1533 Gross per 24 hour  Intake 1097.77 ml  Output 400 ml  Net 697.77 ml    Labs/Imaging Results for orders placed or performed during the hospital encounter of 05/31/22 (from the past 48 hour(s))  CBC     Status: Abnormal   Collection Time: 05/31/22 11:44 PM  Result Value Ref Range   WBC 19.8 (H) 4.0 - 10.5 K/uL   RBC 4.29 3.87 - 5.11 MIL/uL   Hemoglobin 13.0 12.0 - 15.0 g/dL   HCT 38.7 36.0 - 46.0 %   MCV 90.2 80.0 - 100.0 fL   MCH 30.3 26.0 - 34.0 pg   MCHC 33.6 30.0 - 36.0 g/dL   RDW 13.3 11.5 - 15.5 %   Platelets 301 150 - 400 K/uL   nRBC 0.0 0.0 - 0.2 %    Comment: Performed at [facility], [place] [address] [place], [place] [address]  Comprehensive metabolic panel     Status: Abnormal   Collection Time: 05/31/22 11:44 PM  Result Value Ref Range   Sodium 135 135 - 145 mmol/L   Potassium 3.5 3.5 - 5.1 mmol/L   Chloride 100 98 - 111 mmol/L   CO2 24 22 - 32 mmol/L   Glucose, Bld 120 (H) 70 - 99 mg/dL    Comment: Glucose reference range applies only to samples taken after fasting for at least 8 hours.   BUN 8 8 - 23 mg/dL   Creatinine, Ser 0.81 0.44 - 1.00 mg/dL   Calcium 9.0 8.9 - 10.3 mg/dL   Total Protein 6.4 (L) 6.5 - 8.1 g/dL   Albumin 3.8 3.5 - 5.0 g/dL   AST 18 15 - 41 U/L   ALT 12 0 - 44 U/L   Alkaline Phosphatase 53 38 - 126 U/L   Total Bilirubin 0.5 0.3 - 1.2 mg/dL   GFR, Estimated >60 >60 mL/min    Comment: (NOTE) Calculated using the CKD-EPI Creatinine Equation (2021)    Anion gap 11 5 - 15    Comment: Performed at [facility] [address], [place], [place] [address]  Lipase, blood     Status: None   Collection Time: 05/31/22 11:44 PM  Result Value Ref Range   Lipase 44 11 - 51 U/L    Comment: Performed at [facility] [address], [place], [place] [address]  Protime-INR     Status: Abnormal   Collection Time: 05/31/22 11:44 PM  Result Value Ref Range   Prothrombin Time 18.6 (H) 11.4 - 15.2 seconds   INR 1.6  (H) 0.8 - 1.2    Comment: (NOTE) INR goal varies based on device and disease states. Performed at [facility], [place] [address], [place], [place] [address]   Troponin I (High Sensitivity)     Status: None   Collection Time: 05/31/22 11:44 PM  Result Value Ref Range   Troponin I (High Sensitivity) 5 <18 ng/L    Comment: (NOTE) Elevated high sensitivity troponin  I (hsTnI) values and significant  changes across serial measurements may suggest ACS but many other  chronic and acute conditions are known to elevate hsTnI results.  Refer to the "Links" section for chest pain algorithms and additional  guidance. Performed at [facility], [place] [address], [place], [place] [address]   Lactic acid, plasma     Status: Abnormal   Collection Time: 06/01/22 12:14 AM  Result Value Ref Range   Lactic Acid, Venous 2.0 (HH) 0.5 - 1.9 mmol/L    Comment: CRITICAL RESULT CALLED TO, READ BACK BY AND VERIFIED WITH ASHLEY J. RN AT [phone] BY RAMEL CUENCA Performed at [facility], [place] [address], [place], [place] [address]   Troponin I (High Sensitivity)     Status: None   Collection Time: 06/01/22  2:02 AM  Result Value Ref Range   Troponin I (High Sensitivity) 5 <18 ng/L    Comment: (NOTE) Elevated high sensitivity troponin I (hsTnI) values and significant  changes across serial measurements may suggest ACS but many other  chronic and acute conditions are known to elevate hsTnI results.  Refer to the "Links" section for chest pain algorithms and additional  guidance. Performed at [facility], [place] [address], [place], [place] [address]   Urinalysis, Routine w reflex microscopic Urine, Catheterized     Status: Abnormal   Collection Time: 06/01/22  2:43 AM  Result Value Ref Range   Color, Urine YELLOW YELLOW   APPearance CLEAR CLEAR   Specific Gravity, Urine 1.044 (H) 1.005 - 1.030   pH 5.0 5.0 - 8.0   Glucose, UA NEGATIVE NEGATIVE mg/dL   Hgb urine dipstick NEGATIVE  NEGATIVE   Bilirubin Urine NEGATIVE NEGATIVE   Ketones, ur 5 (A) NEGATIVE mg/dL   Protein, ur NEGATIVE NEGATIVE mg/dL   Nitrite NEGATIVE NEGATIVE   Leukocytes,Ua NEGATIVE NEGATIVE    Comment: Performed at [facility] [address] [place], [place] [address]  Lactic acid, plasma     Status: None   Collection Time: 06/01/22  3:00 AM  Result Value Ref Range   Lactic Acid, Venous 1.6 0.5 - 1.9 mmol/L    Comment: Performed at [place] [address], [place], [place] [address]  CBC     Status: Abnormal   Collection Time: 06/01/22  4:24 AM  Result Value Ref Range   WBC 20.1 (H) 4.0 - 10.5 K/uL   RBC 3.86 (L) 3.87 - 5.11 MIL/uL   Hemoglobin 11.9 (L) 12.0 - 15.0 g/dL   HCT 34.0 (L) 36.0 - 46.0 %   MCV 88.1 80.0 - 100.0 fL   MCH 30.8 26.0 - 34.0 pg   MCHC 35.0 30.0 - 36.0 g/dL   RDW 13.5 11.5 - 15.5 %   Platelets 212 150 - 400 K/uL   nRBC 0.0 0.0 - 0.2 %    Comment: Performed at [facility], [place] [address], [place], [place] [address]  Basic metabolic panel     Status: Abnormal   Collection Time: 06/01/22  4:24 AM  Result Value Ref Range   Sodium 136 135 - 145 mmol/L   Potassium 3.9 3.5 - 5.1 mmol/L   Chloride 103 98 - 111 mmol/L   CO2 23 22 - 32 mmol/L   Glucose, Bld 118 (H) 70 - 99 mg/dL    Comment: Glucose reference range applies only to samples taken after fasting for at least 8 hours.   BUN 8  8 - 23 mg/dL   Creatinine, Ser 0.75 0.44 - 1.00 mg/dL   Calcium 8.5 (L) 8.9 - 10.3 mg/dL   GFR, Estimated >60 >60 mL/min    Comment: (NOTE) Calculated using the CKD-EPI Creatinine Equation (2021)    Anion gap 10 5 - 15    Comment: Performed at [facility] [address] [place], [place] [address]  Heparin level (unfractionated)     Status: Abnormal   Collection Time: 06/01/22  5:27 AM  Result Value Ref Range   Heparin Unfractionated >1.10 (H) 0.30 - 0.70 IU/mL    Comment: (NOTE) The clinical reportable range upper limit is being lowered to  >1.10 to align with the FDA approved guidance for the current laboratory assay.  If heparin results are below expected values, and patient dosage has  been confirmed, suggest follow up testing of antithrombin III levels. Performed at [facility], [place] [address] [place], [place] [address]   APTT     Status: Abnormal   Collection Time: 06/01/22  5:27 AM  Result Value Ref Range   aPTT 43 (H) 24 - 36 seconds    Comment:        IF BASELINE aPTT IS ELEVATED, SUGGEST PATIENT RISK ASSESSMENT BE USED TO DETERMINE APPROPRIATE ANTICOAGULANT THERAPY. Performed at [facility], [place] [address] [place], [place] [address]    CT ABDOMEN PELVIS W CONTRAST  Result Date: 06/01/2022 CLINICAL DATA:  Abdominal pain, acute, nonlocalized. Nausea, vomiting. Urinary hesitancy. EXAM: CT ABDOMEN AND PELVIS WITH CONTRAST TECHNIQUE: Multidetector CT imaging of the abdomen and pelvis was performed using the standard protocol following bolus administration of intravenous contrast. RADIATION DOSE REDUCTION: This exam was performed according to the departmental dose-optimization program which includes automated exposure control, adjustment of the mA and/or kV according to patient size and/or use of iterative reconstruction technique. CONTRAST:  181m OMNIPAQUE IOHEXOL 300 MG/ML  SOLN COMPARISON:  06/20/2021 FINDINGS: Lower chest: Stable 8 mm left lower lobe pulmonary nodule since remote prior examination of 12/25/2004, benign. Mild cardiomegaly. Pericardial calcifications are again identified. Small hiatal hernia. Hepatobiliary: Status post cholecystectomy. Mild intra and extrahepatic biliary ductal dilation is stable in keeping with post cholecystectomy change. Liver unremarkable. Pancreas: Unremarkable Spleen: Unremarkable Adrenals/Urinary Tract: The adrenal glands are unremarkable. The kidneys are normal in size and position. A 16 mm enhancing nodule arises exophytically from the interpolar region the left  kidney most in keeping with a primary renal cell carcinoma. This appears stable in size since prior examination. No hydronephrosis. No intrarenal or ureteral calculi. The bladder is unremarkable. Stomach/Bowel: There is a 5.3 x 7.3 x 6.2 cm debris and gas filled collection within the left mid abdomen demonstrating communication with a a loop of mid jejunum at axial image # 37/3 most in keeping with a giant small-bowel diverticulum. This was likely present on prior examination on image # 39/3 of the prior exam but has significantly increased in size since prior examination, where this measured 2.5 cm, and demonstrates significantly increased intraluminal debris. There is now extensive surrounding inflammatory change as well as thickening of the adjacent loop of small bowel in keeping with changes of superimposed small bowel diverticulitis. There is severe descending and sigmoid colonic diverticulosis. There is a superimposed focus of acute inflammatory change involving the mid sigmoid colon best seen on axial image # 70/3 in keeping with mild superimposed acute sigmoid diverticulitis. The inflammatory changes abut the bladder. No evidence of obstruction or perforation. The stomach, small  bowel, and large bowel are otherwise unremarkable. No free intraperitoneal gas. Small free fluid within the pelvis and within the perihepatic region Vascular/Lymphatic: Aortic atherosclerosis. No enlarged abdominal or pelvic lymph nodes. Reproductive: Status post hysterectomy. No adnexal masses. Other: Tiny fat containing umbilical hernia Musculoskeletal: Severe compression deformity of L5 with 80% loss of height and retropulsion of the posterior aspect of the vertebral body appears stable. Marked resultant central canal stenosis again noted. No acute bone abnormality. IMPRESSION: 1. Giant small-bowel diverticulum within the left mid abdomen demonstrating communication with a loop of mid jejunum. This has significantly increased in  size since prior examination and demonstrates significantly increased intraluminal debris. There is now extensive surrounding inflammatory change as well as thickening of the adjacent loop of small bowel in keeping with superimposed small bowel diverticulitis. No evidence of obstruction or perforation. 2. Superimposed mild acute sigmoid diverticulitis. No evidence of obstruction or perforation. Inflammatory changes abut the bladder and may account for the patient's urinary symptoms. 3. Stable 16 mm enhancing nodule within the left kidney most in keeping with a primary renal cell carcinoma. This should be confirmed with dedicated renal mass protocol CT or MRI examination once the patient's acute issues have resolved. 4. Stable severe compression deformity of L5 with 80% loss of height and retropulsion of the posterior aspect of the vertebral body. Marked resultant central canal stenosis. 5. Mild cardiomegaly. Pericardial calcifications again noted. Aortic Atherosclerosis (ICD10-I70.0). Electronically Signed   By: Fidela Salisbury M.D.   On: 06/01/2022 02:21    Pending Labs Unresulted Labs (From admission, onward)     Start     Ordered   06/02/22 0400  Heparin level (unfractionated)  Once-Timed,   TIMED        06/01/22 0426   06/02/22 0400  CBC  Once-Timed,   TIMED        06/01/22 0426   06/02/22 0400  APTT  Once-Timed,   TIMED        06/01/22 0426            Vitals/Pain Today's Vitals   06/01/22 1700 06/01/22 1800 06/01/22 1900 06/01/22 1909  BP: (!) 111/58 (!) 95/47 (!) 105/53   Pulse: 88 91 90   Resp: '14 20 20   '$ Temp:   98.5 F (36.9 C) 98.5 F (36.9 C)  TempSrc:   Oral Oral  SpO2: 96% 97% 96%   Weight:      Height:      PainSc:        Isolation Precautions No active isolations  Medications Medications  lactated ringers infusion ( Intravenous Rate/Dose Verify 06/01/22 1909)  acetaminophen (TYLENOL) tablet 650 mg (650 mg Oral Given 06/01/22 1028)    Or  acetaminophen (TYLENOL)  suppository 650 mg ( Rectal See Alternative 06/01/22 1028)  ondansetron (ZOFRAN) tablet 4 mg (has no administration in time range)    Or  ondansetron (ZOFRAN) injection 4 mg (has no administration in time range)  morphine (PF) 2 MG/ML injection 2 mg (has no administration in time range)  piperacillin-tazobactam (ZOSYN) IVPB 3.375 g (0 g Intravenous Stopped 06/01/22 1533)  heparin ADULT infusion 100 units/mL (25000 units/224m) (has no administration in time range)  ondansetron (ZOFRAN) injection 4 mg (4 mg Intravenous Given 06/01/22 0015)  sodium chloride 0.9 % bolus 1,000 mL (0 mLs Intravenous Stopped 06/01/22 0247)  iohexol (OMNIPAQUE) 300 MG/ML solution 100 mL (100 mLs Intravenous Contrast Given 06/01/22 0159)  piperacillin-tazobactam (ZOSYN) IVPB 3.375 g (0 g Intravenous Stopped 06/01/22 0408)  Mobility walks with person assist Low fall risk   Focused Assessments Neuro Assessment Handoff:  Swallow screen pass? Yes  Cardiac Rhythm: Normal sinus rhythm       Neuro Assessment:   Neuro Checks:      Last Documented NIHSS Modified Score:   Has TPA been given? No If patient is a Neuro Trauma and patient is going to OR before floor call report to [place] nurse: [phone] or [phone]   R Recommendations: See Admitting Provider Note  Report given to:   Additional Notes: pt is AAOx4. Pt is on RA. Pt is ambulatory with assistance. Pt is NPO.

## 2022-06-01 NOTE — Progress Notes (Signed)
ANTICOAGULATION CONSULT NOTE - Initial Consult  Pharmacy Consult for heparin Indication: atrial fibrillation  Allergies  Allergen Reactions   Allopurinol     More severe gout    Amiodarone Other (See Comments)    Dizziness, halucinations   Corticosteroids Other (See Comments)    Liver scarring as a child   Other Other (See Comments)    Cannot take STEROIDS-unknown reaction-caused by internal scarring on liver.    Anesthesia precaution due to excessive liver scarring    Erythromycin Nausea And Vomiting and Rash    Patient Measurements: Height: '5\' 2"'$  (157.5 cm) Weight: 63 kg (139 lb) IBW/kg (Calculated) : 50.1 Heparin Dosing Weight: 62.8 Kg  Vital Signs: Temp: 97.8 F (36.6 C) (08/21 2331) Temp Source: Oral (08/21 2331) BP: 114/49 (08/22 0400) Pulse Rate: 89 (08/22 0400)  Labs: Recent Labs    05/31/22 2344 06/01/22 0202  HGB 13.0  --   HCT 38.7  --   PLT 301  --   LABPROT 18.6*  --   INR 1.6*  --   CREATININE 0.81  --   TROPONINIHS 5 5    Estimated Creatinine Clearance: 49.2 mL/min (by C-G formula based on SCr of 0.81 mg/dL).   Medical History: Past Medical History:  Diagnosis Date   Atrial fibrillation ([place])    Status post TEE cardioversion   Constrictive pericarditis    2 status post pericardial stripping at the [facility]   Gout    Hypertension    Spastic dysphonia    Supraventricular tachycardia ([place])    Status post RF ablation by Dr. Peterson Lombard    Assessment: 81 y.o. female with medical history significant of A.Fib, HTN, on Xarelto prior to admission. Last dose reported 8/21 at 2100. Hgb/Plt WNL. Baseline heparin level/aPTT pending. Pharmacy to start heparin incase or possible procedure. Will start ~24 hours from last Xarelto dose and use aPTT for monitoring until heparin levels correlate.  Goal of Therapy:  Heparin level 0.3-0.7 units/ml aPTT: 66-102 sec Monitor platelets by anticoagulation protocol: Yes   Plan:  Start heparin infusion at 750  units/hr at 2100 Check aPTT/anti-Xa level 8 hours after start and daily while on heparin Continue to monitor H&H and platelets  Georga Bora, PharmD Clinical Pharmacist 06/01/2022 4:24 AM Please check AMION for all [place] numbers

## 2022-06-01 NOTE — H&P (Addendum)
History and Physical    Patient: Yolanda Bell UMP:[id number] DOB: [birth year] DOA: 05/31/2022 DOS: the patient was seen and examined on 06/01/2022 PCP: Yolanda Congress, NP  Patient coming from: Home  Chief Complaint:  Chief Complaint  Patient presents with   Abdominal Pain   HPI: Yolanda Bell is a 81 y.o. female with medical history significant of A.Fib, HTN, on Xarelto.  Pt presents to ED with c/o diffuse abd pain, nausea, NBNB emesis.  Onset 6pm this evening.  Also urinary urgency, but unable to urinate.  Symptoms constant.  No CP, no fever.  No recent mechanical falls.  No saddle anesthesia, no leg weakness.  Has back pain in lower back that is chronic and unchanged from baseline per patient.   Review of Systems: As mentioned in the history of present illness. All other systems reviewed and are negative. Past Medical History:  Diagnosis Date   Atrial fibrillation ([place])    Status post TEE cardioversion   Constrictive pericarditis    2 status post pericardial stripping at the [facility]   Gout    Hypertension    Spastic dysphonia    Supraventricular tachycardia ([place])    Status post RF ablation by Dr. Peterson Lombard   Past Surgical History:  Procedure Laterality Date   [place]  11/21/2012   Procedure: CARDIOVERSION;  Surgeon: Darlin Coco, MD;  Location: [place];  Service: Cardiovascular;;   CARDIOVERSION N/A 06/10/2014   Procedure: CARDIOVERSION;  Surgeon: Thayer Headings, MD;  Location: [place];  Service: Cardiovascular;  Laterality: N/A;   CARDIOVERSION N/A 10/14/2015   Procedure: CARDIOVERSION;  Surgeon: Josue Hector, MD;  Location: [facility] ENDOSCOPY;  Service: Cardiovascular;  Laterality: N/A;   CARDIOVERSION N/A 05/12/2016   Procedure: CARDIOVERSION;  Surgeon: Skeet Latch, MD;  Location: [facility] ENDOSCOPY;  Service: Cardiovascular;  Laterality: N/A;   CARDIOVERSION N/A 04/09/2022   Procedure: CARDIOVERSION;   Surgeon: Elouise Munroe, MD;  Location: [facility] ENDOSCOPY;  Service: Cardiovascular;  Laterality: N/A;   [place] N/A 08/20/2016   Procedure: Atrial Fibrillation Ablation;  Surgeon: [name], MD;  Location: [place] CV LAB;  Service: Cardiovascular;  Laterality: N/A;   EYE SURGERY     cataracts   PERICARDIECTOMY     TEE WITHOUT CARDIOVERSION N/A 06/10/2014   Procedure: TRANSESOPHAGEAL ECHOCARDIOGRAM (TEE);  Surgeon: Thayer Headings, MD;  Location: [place];  Service: Cardiovascular;  Laterality: N/A;   TONSILLECTOMY     Social History:  reports that she has never smoked. She has never used smokeless tobacco. She reports current alcohol use. She reports that she does not use drugs.  Allergies  Allergen Reactions   Allopurinol     More severe gout    Amiodarone Other (See Comments)    Dizziness, halucinations   Corticosteroids Other (See Comments)    Liver scarring as a child   Other Other (See Comments)    Cannot take STEROIDS-unknown reaction-caused by internal scarring on liver.    Anesthesia precaution due to excessive liver scarring    Erythromycin Nausea And Vomiting and Rash    Family History  Problem Relation Age of Onset   Heart disease Mother    Emphysema Father    Breast cancer Sister     Prior to Admission medications   Medication Sig Start Date End Date Taking? Authorizing Provider  acetaminophen (TYLENOL) 500 MG tablet Take 1,000 mg  by mouth every 6 (six) hours as needed for mild pain.    [provider]  Ascorbic Acid (VITAMIN C) 500 MG CAPS Take 500 mg by mouth daily.    [provider]  Calcium Carb-Cholecalciferol (CALCIUM 600+D) 600-20 MG-MCG TABS Take 1 tablet by mouth daily.    [provider]  denosumab (PROLIA) 60 MG/ML SOSY injection Inject 60 mg into the skin every 6 (six) months. Last dose End of may early april    [provider]  DENTA 5000 PLUS 1.1 % CREA  dental cream Place 1 application  onto teeth 2 (two) times daily. 05/31/21   [provider]  indomethacin (INDOCIN) 25 MG capsule Take 25 mg by mouth as needed (gout). For gout    [provider]  metoprolol succinate (TOPROL-XL) 25 MG 24 hr tablet Take one tablet by mouth (25 mg) twice a day. 04/09/22   Elouise Munroe, MD  potassium chloride (KLOR-CON M10) 10 MEQ tablet Take 1 tablet (10 mEq total) by mouth daily as needed (take with torsemide). 04/09/22   Elouise Munroe, MD  propranolol (INDERAL) 10 MG tablet TAKE 1 TABLET BY MOUTH 4 TIMES A DAY AS NEEDED 04/30/22   Nahser, Wonda Cheng, MD  spironolactone (ALDACTONE) 50 MG tablet TAKE 1 TABLET BY MOUTH EVERY DAY 02/17/22   Nahser, Wonda Cheng, MD  torsemide (DEMADEX) 20 MG tablet Take 1 tablet (20 mg total) by mouth daily as needed (Take if your weight increases more than 2 pounds in 24 hours.). 06/23/21   Regalado, Cassie Freer, MD  XARELTO 20 MG TABS tablet TAKE 1 TABLET BY MOUTH DAILY WITH SUPPER 04/05/22   Nahser, Wonda Cheng, MD    Physical Exam: Vitals:   06/01/22 0100 06/01/22 0130 06/01/22 0230 06/01/22 0315  BP: 128/73 134/60 (!) 134/59 (!) 118/56  Pulse: 82 81 78 81  Resp: '18 19 19 19  '$ Temp:      TempSrc:      SpO2: 95% 98% 99% 96%  Weight:      Height:       Constitutional: NAD, calm, comfortable Eyes: PERRL, lids and conjunctivae normal ENMT: Mucous membranes are moist. Posterior pharynx clear of any exudate or lesions.Normal dentition.  Neck: normal, supple, no masses, no thyromegaly Respiratory: clear to auscultation bilaterally, no wheezing, no crackles. Normal respiratory effort. No accessory muscle use.  Cardiovascular: Regular rate and rhythm, no murmurs / rubs / gallops. No extremity edema. 2+ pedal pulses. No carotid bruits.  Abdomen: TTP, especially over LUQ Musculoskeletal: no clubbing / cyanosis. No joint deformity upper and lower extremities. Good ROM, no contractures. Normal muscle tone.  Skin: no  rashes, lesions, ulcers. No induration Neurologic: CN 2-12 grossly intact. Sensation intact, DTR normal. Strength 5/5 in all 4.  Psychiatric: Normal judgment and insight. Alert and oriented x 3. Normal mood.   Data Reviewed:    EKG: sinus rhythm for the moment  CBC    Component Value Date/Time   WBC 19.8 (H) 05/31/2022 2344   RBC 4.29 05/31/2022 2344   HGB 13.0 05/31/2022 2344   HGB 12.9 04/06/2022 1429   HCT 38.7 05/31/2022 2344   HCT 38.1 04/06/2022 1429   PLT 301 05/31/2022 2344   PLT 207 04/06/2022 1429   MCV 90.2 05/31/2022 2344   MCV 91 04/06/2022 1429   MCH 30.3 05/31/2022 2344   MCHC 33.6 05/31/2022 2344   RDW 13.3 05/31/2022 2344   RDW 13.5 04/06/2022 1429   LYMPHSABS 1,620 08/12/2016  1022   MONOABS 324 08/12/2016 1022   EOSABS 54 08/12/2016 1022   BASOSABS 0 08/12/2016 1022   CMP     Component Value Date/Time   NA 135 05/31/2022 2344   NA 138 04/06/2022 1429   K 3.5 05/31/2022 2344   CL 100 05/31/2022 2344   CO2 24 05/31/2022 2344   GLUCOSE 120 (H) 05/31/2022 2344   BUN 8 05/31/2022 2344   BUN 10 04/06/2022 1429   CREATININE 0.81 05/31/2022 2344   CREATININE 0.90 08/12/2016 1022   CALCIUM 9.0 05/31/2022 2344   PROT 6.4 (L) 05/31/2022 2344   PROT 6.9 11/21/2019 1035   ALBUMIN 3.8 05/31/2022 2344   ALBUMIN 4.5 11/21/2019 1035   AST 18 05/31/2022 2344   ALT 12 05/31/2022 2344   ALKPHOS 53 05/31/2022 2344   BILITOT 0.5 05/31/2022 2344   BILITOT 0.9 11/21/2019 1035   GFRNONAA >60 05/31/2022 2344   GFRAA 66 11/21/2019 1035   Lactate 2.0, 1.6 on repeat  UA neg.  CT AP: IMPRESSION: 1. Giant small-bowel diverticulum within the left mid abdomen demonstrating communication with a loop of mid jejunum. This has significantly increased in size since prior examination and demonstrates significantly increased intraluminal debris. There is now extensive surrounding inflammatory change as well as thickening of the adjacent loop of small bowel in keeping with  superimposed small bowel diverticulitis. No evidence of obstruction or perforation. 2. Superimposed mild acute sigmoid diverticulitis. No evidence of obstruction or perforation. Inflammatory changes abut the bladder and may account for the patient's urinary symptoms. 3. Stable 16 mm enhancing nodule within the left kidney most in keeping with a primary renal cell carcinoma. This should be confirmed with dedicated renal mass protocol CT or MRI examination once the patient's acute issues have resolved. 4. Stable severe compression deformity of L5 with 80% loss of height and retropulsion of the posterior aspect of the vertebral body. Marked resultant central canal stenosis. 5. Mild cardiomegaly. Pericardial calcifications again noted.  Assessment and Plan: * Diverticulitis of small bowel Giant small bowel diverticulum, (apparently of jejunum) with acute diverticulitis.  Diverticulum measures 5.3 x 7.3 x 6.2 cm up from 2.5cm in 2022. Also mild diverticulitis of sigmoid. Inflammation about bladder likely causing sensation of urinary urgency. NPO IVF Zosyn Morphine PRN pain Switching Xarelto to heparin gtt Getting patient admitted while general surgery figures out what to do about the giant diverticulum (EDP speaking with gen surg now). Gen surg says they will see pt later today  Urinary urgency Inflammation about bladder from diverticulitis likely to be causing todays sensation of urinary urgency / retention (as noted on CT scan).  Pt doesn't appear to actually be retaining from bladder scans. Compression Fx of L5, while severe, is old, stable on imaging and chronic (was chronic even on 06/2021 CT).  PAF (paroxysmal atrial fibrillation) (HCC) Cont BB. Hold Xarelto, Heparin per pharm instead.  Hypertension Cont BB Hold diuretics.      Advance Care Planning:   Code Status: Full Code  Consults: EDP speaking with Dr. Donne Hazel, Gen surg will see pt in consult later today.  Family  Communication: No family in room  Severity of Illness: The appropriate patient status for this patient is OBSERVATION. Observation status is judged to be reasonable and necessary in order to provide the required intensity of service to ensure the patient's safety. The patient's presenting symptoms, physical exam findings, and initial radiographic and laboratory data in the context of their medical condition is felt to place them  at decreased risk for further clinical deterioration. Furthermore, it is anticipated that the patient will be medically stable for discharge from the hospital within 2 midnights of admission.   Author: Etta Quill., DO 06/01/2022 3:55 AM  For on call review [email or web address].

## 2022-06-01 NOTE — Assessment & Plan Note (Signed)
Cont BB Hold diuretics.

## 2022-06-01 NOTE — ED Provider Notes (Signed)
[facility] Emergency Department Provider Note MRN:  [id number]  [place] date & time: 06/01/22     Chief Complaint   Abdominal Pain   History of Present Illness   Yolanda Bell is a 81 y.o. year-old female with a history of A-fib presenting to the ED with chief complaint of abdominal pain.  Diffuse abdominal pain, nausea signs 6 PM this evening.  Also has noticed that she has not made any urine today.  Denies chest pain, no recent fever.  Review of Systems  A thorough review of systems was obtained and all systems are negative except as noted in the HPI and PMH.   Patient's Health History    Past Medical History:  Diagnosis Date   Atrial fibrillation ([place])    Status post TEE cardioversion   Constrictive pericarditis    2 status post pericardial stripping at the [facility]   Gout    Hypertension    Spastic dysphonia    Supraventricular tachycardia ([place])    Status post RF ablation by Dr. Peterson Lombard    Past Surgical History:  Procedure Laterality Date   [place]  11/21/2012   Procedure: CARDIOVERSION;  Surgeon: Darlin Coco, MD;  Location: [place];  Service: Cardiovascular;;   CARDIOVERSION N/A 06/10/2014   Procedure: CARDIOVERSION;  Surgeon: Thayer Headings, MD;  Location: [place];  Service: Cardiovascular;  Laterality: N/A;   CARDIOVERSION N/A 10/14/2015   Procedure: CARDIOVERSION;  Surgeon: Josue Hector, MD;  Location: [facility] ENDOSCOPY;  Service: Cardiovascular;  Laterality: N/A;   CARDIOVERSION N/A 05/12/2016   Procedure: CARDIOVERSION;  Surgeon: Skeet Latch, MD;  Location: [facility] ENDOSCOPY;  Service: Cardiovascular;  Laterality: N/A;   CARDIOVERSION N/A 04/09/2022   Procedure: CARDIOVERSION;  Surgeon: Elouise Munroe, MD;  Location: [facility] ENDOSCOPY;  Service: Cardiovascular;  Laterality: N/A;   [place] N/A 08/20/2016   Procedure: Atrial Fibrillation  Ablation;  Surgeon: [name], MD;  Location: [place] CV LAB;  Service: Cardiovascular;  Laterality: N/A;   EYE SURGERY     cataracts   PERICARDIECTOMY     TEE WITHOUT CARDIOVERSION N/A 06/10/2014   Procedure: TRANSESOPHAGEAL ECHOCARDIOGRAM (TEE);  Surgeon: Thayer Headings, MD;  Location: [facility] ENDOSCOPY;  Service: Cardiovascular;  Laterality: N/A;   TONSILLECTOMY      Family History  Problem Relation Age of Onset   Heart disease Mother    Emphysema Father    Breast cancer Sister     Social History   Socioeconomic History   Marital status: Divorced    Spouse name: Not on file   Number of children: Not on file   Years of education: Not on file   Highest education level: Not on file  Occupational History   Not on file  Tobacco Use   Smoking status: Never   Smokeless tobacco: Never  Vaping Use   Vaping Use: Never used  Substance and Sexual Activity   Alcohol use: Yes    Comment: occasionally   Drug use: No   Sexual activity: Not on file  Other Topics Concern   Not on file  Social History Narrative   Not on file   Social Determinants of Health   Financial Resource Strain: Not on file  Food Insecurity: Not on file  Transportation Needs: Not on file  Physical Activity: Not on file  Stress: Not on file  Social Connections: Not  on file  Intimate Partner Violence: Not on file     Physical Exam   Vitals:   06/01/22 0630 06/01/22 0645  BP: (!) 96/55 (!) 110/54  Pulse: 93 81  Resp: (!) 22 (!) 21  Temp:    SpO2: 90% 95%    CONSTITUTIONAL: Well-appearing, NAD NEURO/PSYCH:  Alert and oriented x 3, no focal deficits EYES:  eyes equal and reactive ENT/NECK:  no LAD, no JVD CARDIO: Regular rate, well-perfused, normal S1 and S2 PULM:  CTAB no wheezing or rhonchi GI/GU:  non-distended, non-tender MSK/SPINE:  No gross deformities, no edema SKIN:  no rash, atraumatic   *Additional and/or pertinent findings included in MDM below  Diagnostic and  Interventional Summary    EKG Interpretation  Date/Time:  Tuesday June 01 2022 00:19:20 EDT Ventricular Rate:  80 PR Interval:  157 QRS Duration: 99 QT Interval:  376 QTC Calculation: 434 R Axis:   83 Text Interpretation: Sinus rhythm Borderline right axis deviation Confirmed by Gerlene Fee [phone]) on 06/01/2022 12:48:45 AM       Labs Reviewed  CBC - Abnormal; Notable for the following components:      Result Value   WBC 19.8 (*)    All other components within normal limits  COMPREHENSIVE METABOLIC PANEL - Abnormal; Notable for the following components:   Glucose, Bld 120 (*)    Total Protein 6.4 (*)    All other components within normal limits  URINALYSIS, ROUTINE W REFLEX MICROSCOPIC - Abnormal; Notable for the following components:   Specific Gravity, Urine 1.044 (*)    Ketones, ur 5 (*)    All other components within normal limits  LACTIC ACID, PLASMA - Abnormal; Notable for the following components:   Lactic Acid, Venous 2.0 (*)    All other components within normal limits  PROTIME-INR - Abnormal; Notable for the following components:   Prothrombin Time 18.6 (*)    INR 1.6 (*)    All other components within normal limits  CBC - Abnormal; Notable for the following components:   WBC 20.1 (*)    RBC 3.86 (*)    Hemoglobin 11.9 (*)    HCT 34.0 (*)    All other components within normal limits  BASIC METABOLIC PANEL - Abnormal; Notable for the following components:   Glucose, Bld 118 (*)    Calcium 8.5 (*)    All other components within normal limits  HEPARIN LEVEL (UNFRACTIONATED) - Abnormal; Notable for the following components:   Heparin Unfractionated >1.10 (*)    All other components within normal limits  APTT - Abnormal; Notable for the following components:   aPTT 43 (*)    All other components within normal limits  LIPASE, BLOOD  LACTIC ACID, PLASMA  TROPONIN I (HIGH SENSITIVITY)  TROPONIN I (HIGH SENSITIVITY)    CT ABDOMEN PELVIS W CONTRAST  Final  Result      Medications  lactated ringers infusion ( Intravenous New Bag/Given 06/01/22 0337)  acetaminophen (TYLENOL) tablet 650 mg (has no administration in time range)    Or  acetaminophen (TYLENOL) suppository 650 mg (has no administration in time range)  ondansetron (ZOFRAN) tablet 4 mg (has no administration in time range)    Or  ondansetron (ZOFRAN) injection 4 mg (has no administration in time range)  morphine (PF) 2 MG/ML injection 2 mg (has no administration in time range)  piperacillin-tazobactam (ZOSYN) IVPB 3.375 g (has no administration in time range)  heparin ADULT infusion 100 units/mL (25000 units/273m) (has no  administration in time range)  ondansetron (ZOFRAN) injection 4 mg (4 mg Intravenous Given 06/01/22 0015)  sodium chloride 0.9 % bolus 1,000 mL (0 mLs Intravenous Stopped 06/01/22 0247)  iohexol (OMNIPAQUE) 300 MG/ML solution 100 mL (100 mLs Intravenous Contrast Given 06/01/22 0159)  piperacillin-tazobactam (ZOSYN) IVPB 3.375 g (0 g Intravenous Stopped 06/01/22 0408)     Procedures  /  Critical Care Procedures  ED Course and Medical Decision Making  Initial Impression and Ddx Differential diagnosis includes urinary retention, renal failure, SBO, perforated viscus, awaiting labs, CT.  Past medical/surgical history that increases complexity of ED encounter: A-fib  Interpretation of Diagnostics I personally reviewed the laboratory assessment and my interpretation is as follows: Leukocytosis, mildly elevated lactate, otherwise no significant blood count or electrolyte disturbance  CT revealing very large small bowel diverticulum with diverticulitis  Patient Reassessment and Ultimate Disposition/Management     General surgery consulted for recommendations, will follow, admitted to medicine for further care.  Starting fluids and antibiotics.  Patient management required discussion with the following services or consulting groups:  Hospitalist Service and  General/Trauma Surgery  Complexity of Problems Addressed Acute illness or injury that poses threat of life of bodily function  Additional Data Reviewed and Analyzed Further history obtained from: None  Additional Factors Impacting ED Encounter Risk Consideration of hospitalization  Barth Kirks. [place]onia Small, [place] [email or web address]  Final Clinical Impressions(s) / ED Diagnoses     ICD-10-CM   1. Abdominal pain, unspecified abdominal location  R10.9     2. Diverticulitis  K57.92       ED Discharge Orders     None        Discharge Instructions Discussed with and Provided to Patient:   Discharge Instructions   None      Maudie Flakes, MD 06/01/22 [phone]

## 2022-06-01 NOTE — Assessment & Plan Note (Addendum)
Inflammation about bladder from diverticulitis likely to be causing todays sensation of urinary urgency / retention (as noted on CT scan).  Pt doesn't appear to actually be retaining from bladder scans. UA not c/w UTI

## 2022-06-01 NOTE — Progress Notes (Signed)
Pharmacy Antibiotic Note  Yolanda Bell is a 81 y.o. female admitted on 05/31/2022 with  diverticulitis .  Pharmacy has been consulted for Zosyn dosing.  Plan: Zosyn 3.375gm IV every 8 hours. Follow-up micro data, renal function, and pt's clinical condition   Height: '5\' 2"'$  (157.5 cm) Weight: 63 kg (139 lb) IBW/kg (Calculated) : 50.1  Temp (24hrs), Avg:97.8 F (36.6 C), Min:97.8 F (36.6 C), Max:97.8 F (36.6 C)  Recent Labs  Lab 05/31/22 2344 06/01/22 0014  WBC 19.8*  --   CREATININE 0.81  --   LATICACIDVEN  --  2.0*    Estimated Creatinine Clearance: 49.2 mL/min (by C-G formula based on SCr of 0.81 mg/dL).    Allergies  Allergen Reactions   Allopurinol     More severe gout    Amiodarone Other (See Comments)    Dizziness, halucinations   Corticosteroids Other (See Comments)    Liver scarring as a child   Other Other (See Comments)    Cannot take STEROIDS-unknown reaction-caused by internal scarring on liver.    Anesthesia precaution due to excessive liver scarring    Erythromycin Nausea And Vomiting and Rash    Antimicrobials this admission: 8/22 Zosyn >>    Thank you for allowing pharmacy to be a part of this patient's care.  Sherlon Handing, PharmD, BCPS Please see amion for complete clinical pharmacist phone list 06/01/2022 3:33 AM

## 2022-06-01 NOTE — Assessment & Plan Note (Addendum)
Giant small bowel diverticulum, (apparently of jejunum) with acute diverticulitis.  Diverticulum measures 5.3 x 7.3 x 6.2 cm up from 2.5cm in 2022. Also mild diverticulitis of sigmoid. Inflammation about bladder likely causing sensation of urinary urgency. adat per surgery Morphine PRN pain Switching Xarelto to heparin gtt - currently on hold Appreciate general surgery recs ->now s/p small bowel resection and ex lap on 8/24 -> d/c NGT today, concern for oozing from SB anastomosis, holding heparin for now, per surgery

## 2022-06-01 NOTE — Assessment & Plan Note (Addendum)
Cont BB. Hold Xarelto, Heparin per pharm to resume 8/25 AM On scheduled IV metoprolol for now Seen by cardiology, note can hold anticoagulation as needed, continue beta blocker, high risk for post surgical atrial fib

## 2022-06-02 DIAGNOSIS — D4102 Neoplasm of uncertain behavior of left kidney: Secondary | ICD-10-CM | POA: Diagnosis present

## 2022-06-02 DIAGNOSIS — Z825 Family history of asthma and other chronic lower respiratory diseases: Secondary | ICD-10-CM | POA: Diagnosis not present

## 2022-06-02 DIAGNOSIS — I493 Ventricular premature depolarization: Secondary | ICD-10-CM | POA: Diagnosis present

## 2022-06-02 DIAGNOSIS — I4891 Unspecified atrial fibrillation: Secondary | ICD-10-CM | POA: Diagnosis not present

## 2022-06-02 DIAGNOSIS — K5792 Diverticulitis of intestine, part unspecified, without perforation or abscess without bleeding: Secondary | ICD-10-CM | POA: Diagnosis present

## 2022-06-02 DIAGNOSIS — R000 Tachycardia, unspecified: Secondary | ICD-10-CM | POA: Diagnosis present

## 2022-06-02 DIAGNOSIS — K5712 Diverticulitis of small intestine without perforation or abscess without bleeding: Secondary | ICD-10-CM | POA: Diagnosis not present

## 2022-06-02 DIAGNOSIS — Z888 Allergy status to other drugs, medicaments and biological substances status: Secondary | ICD-10-CM | POA: Diagnosis not present

## 2022-06-02 DIAGNOSIS — E876 Hypokalemia: Secondary | ICD-10-CM | POA: Diagnosis present

## 2022-06-02 DIAGNOSIS — K5752 Diverticulitis of both small and large intestine without perforation or abscess without bleeding: Secondary | ICD-10-CM | POA: Diagnosis present

## 2022-06-02 DIAGNOSIS — R3915 Urgency of urination: Secondary | ICD-10-CM | POA: Diagnosis present

## 2022-06-02 DIAGNOSIS — M4856XA Collapsed vertebra, not elsewhere classified, lumbar region, initial encounter for fracture: Secondary | ICD-10-CM | POA: Diagnosis present

## 2022-06-02 DIAGNOSIS — S32000A Wedge compression fracture of unspecified lumbar vertebra, initial encounter for closed fracture: Secondary | ICD-10-CM

## 2022-06-02 DIAGNOSIS — Z8616 Personal history of COVID-19: Secondary | ICD-10-CM

## 2022-06-02 DIAGNOSIS — Z803 Family history of malignant neoplasm of breast: Secondary | ICD-10-CM | POA: Diagnosis not present

## 2022-06-02 DIAGNOSIS — Z9049 Acquired absence of other specified parts of digestive tract: Secondary | ICD-10-CM | POA: Diagnosis not present

## 2022-06-02 DIAGNOSIS — J383 Other diseases of vocal cords: Secondary | ICD-10-CM | POA: Diagnosis present

## 2022-06-02 DIAGNOSIS — D62 Acute posthemorrhagic anemia: Secondary | ICD-10-CM | POA: Diagnosis not present

## 2022-06-02 DIAGNOSIS — R188 Other ascites: Secondary | ICD-10-CM | POA: Diagnosis present

## 2022-06-02 DIAGNOSIS — I10 Essential (primary) hypertension: Secondary | ICD-10-CM | POA: Diagnosis present

## 2022-06-02 DIAGNOSIS — I480 Paroxysmal atrial fibrillation: Secondary | ICD-10-CM | POA: Diagnosis present

## 2022-06-02 DIAGNOSIS — Z8719 Personal history of other diseases of the digestive system: Secondary | ICD-10-CM

## 2022-06-02 DIAGNOSIS — N2889 Other specified disorders of kidney and ureter: Secondary | ICD-10-CM

## 2022-06-02 DIAGNOSIS — K5732 Diverticulitis of large intestine without perforation or abscess without bleeding: Secondary | ICD-10-CM

## 2022-06-02 DIAGNOSIS — Z7901 Long term (current) use of anticoagulants: Secondary | ICD-10-CM | POA: Diagnosis not present

## 2022-06-02 DIAGNOSIS — Z881 Allergy status to other antibiotic agents status: Secondary | ICD-10-CM | POA: Diagnosis not present

## 2022-06-02 DIAGNOSIS — K55029 Acute infarction of small intestine, extent unspecified: Secondary | ICD-10-CM | POA: Diagnosis present

## 2022-06-02 DIAGNOSIS — Z8249 Family history of ischemic heart disease and other diseases of the circulatory system: Secondary | ICD-10-CM | POA: Diagnosis not present

## 2022-06-02 DIAGNOSIS — I2510 Atherosclerotic heart disease of native coronary artery without angina pectoris: Secondary | ICD-10-CM | POA: Diagnosis present

## 2022-06-02 DIAGNOSIS — K769 Liver disease, unspecified: Secondary | ICD-10-CM | POA: Diagnosis present

## 2022-06-02 LAB — CBC
HCT: 29 % — ABNORMAL LOW (ref 36.0–46.0)
Hemoglobin: 9.9 g/dL — ABNORMAL LOW (ref 12.0–15.0)
MCH: 30.7 pg (ref 26.0–34.0)
MCHC: 34.1 g/dL (ref 30.0–36.0)
MCV: 89.8 fL (ref 80.0–100.0)
Platelets: 167 10*3/uL (ref 150–400)
RBC: 3.23 MIL/uL — ABNORMAL LOW (ref 3.87–5.11)
RDW: 13.6 % (ref 11.5–15.5)
WBC: 14.2 10*3/uL — ABNORMAL HIGH (ref 4.0–10.5)
nRBC: 0 % (ref 0.0–0.2)

## 2022-06-02 LAB — APTT: aPTT: 57 seconds — ABNORMAL HIGH (ref 24–36)

## 2022-06-02 LAB — HEPARIN LEVEL (UNFRACTIONATED): Heparin Unfractionated: 0.79 IU/mL — ABNORMAL HIGH (ref 0.30–0.70)

## 2022-06-02 MED ORDER — METOPROLOL SUCCINATE ER 25 MG PO TB24
25.0000 mg | ORAL_TABLET | Freq: Every day | ORAL | Status: DC
  Administered 2022-06-02: 25 mg via ORAL
  Filled 2022-06-02: qty 1

## 2022-06-02 MED ORDER — FLUTICASONE PROPIONATE 50 MCG/ACT NA SUSP
2.0000 | Freq: Every day | NASAL | Status: DC
  Filled 2022-06-02: qty 16

## 2022-06-02 NOTE — Assessment & Plan Note (Signed)
Unclear hx surrounding this Related to steroids/volume? unclear Would recommend follow up outpatient

## 2022-06-02 NOTE — Assessment & Plan Note (Signed)
abx Appreciate general surgery

## 2022-06-02 NOTE — Assessment & Plan Note (Addendum)
Concerning for renal cell carcinoma Needs additional imaging  Discussed with pt

## 2022-06-02 NOTE — Progress Notes (Signed)
PROGRESS NOTE    Yolanda Bell  DGU:[id number] DOB: [birth year] DOA: 05/31/2022 PCP: Faustino Congress, NP  Chief Complaint  Patient presents with   Abdominal Pain    Brief Narrative:  81 yo with hx atrial fibrillation on xarelto, HTN, recent covid 19 infection who presented with diffuse abdominal pain, nausea, and vomiting as well as urinary urgency/hesitation.  See below for additional details    Assessment & Plan:   Principal Problem:   Diverticulitis of small bowel Active Problems:   Diverticulitis large intestine   Urinary urgency   Hypertension   PAF (paroxysmal atrial fibrillation) (HCC)   Nodule of kidney   Compression fracture of lumbar vertebra (HCC)   History of COVID-19   History of liver disease   Assessment and Plan: * Diverticulitis of small bowel Giant small bowel diverticulum, (apparently of jejunum) with acute diverticulitis.  Diverticulum measures 5.3 x 7.3 x 6.2 cm up from 2.5cm in 2022. Also mild diverticulitis of sigmoid. Inflammation about bladder likely causing sensation of urinary urgency. NPO IVF Zosyn Morphine PRN pain Switching Xarelto to heparin gtt Appreciate general surgery recs -> planning for exploratory laparotomy with small bowel resection tomorrow (also plan to eval from sigmoid diverticulitis standpoint) No additional cardiac testing needed prior to surgery  Diverticulitis large intestine abx Appreciate general surgery  Urinary urgency Inflammation about bladder from diverticulitis likely to be causing todays sensation of urinary urgency / retention (as noted on CT scan).  Pt doesn't appear to actually be retaining from bladder scans. UA not c/w UTI    Hypertension Cont BB Hold diuretics.  PAF (paroxysmal atrial fibrillation) (HCC) Cont BB. Hold Xarelto, Heparin per pharm instead. Seen by cardiology, note can hold anticoagulation as needed, continue beta blocker, high risk for post surgical atrial fib  Compression  fracture of lumbar vertebra (HCC) Compression Fx of L5, while severe, is old, stable on imaging and chronic (was chronic even on 06/2021 CT).  History of COVID-19 August 9th symptoms started, tested positive 8/10 8/17 she reports home test was negative Has some residual L ear pain, otherwise sx improved  History of liver disease Unclear hx surrounding this Related to steroids/volume? unclear Would recommend follow up outpatient     DVT prophylaxis: heparin gtt Code Status: full Family Communication: none Disposition:   Status is: Observation The patient will require care spanning > 2 midnights and should be moved to inpatient because: need for abx/surgery   Consultants:  Surgery cardiology  Procedures:  none  Antimicrobials:  Anti-infectives (From admission, onward)    Start     Dose/Rate Route Frequency Ordered Stop   06/01/22 1200  piperacillin-tazobactam (ZOSYN) IVPB 3.375 g        3.375 g 12.5 mL/hr over 240 Minutes Intravenous Every 8 hours 06/01/22 0335     06/01/22 0315  piperacillin-tazobactam (ZOSYN) IVPB 3.375 g        3.375 g 100 mL/hr over 30 Minutes Intravenous  Once 06/01/22 0305 06/01/22 0408       Subjective: No new complaints Pain is ok at rest  Objective: Vitals:   06/01/22 2103 06/01/22 2326 06/02/22 0405 06/02/22 0828  BP: (!) 122/55 (!) 114/49 (!) 119/55 122/71  Pulse: 98 92 88 99  Resp: '17 18 20 18  '$ Temp: 98.5 F (36.9 C) 98.2 F (36.8 C) 98.2 F (36.8 C) 98.2 F (36.8 C)  TempSrc: Oral Oral Oral   SpO2: 95% 96% 96% 95%  Weight:      Height:  Intake/Output Summary (Last 24 hours) at 06/02/2022 1413 Last data filed at 06/02/2022 0900 Gross per 24 hour  Intake 47.77 ml  Output --  Net 47.77 ml   Filed Weights   05/31/22 2332  Weight: 63 kg    Examination:  General exam: Appears calm and comfortable  Respiratory system: unlabored Cardiovascular system: RRR Gastrointestinal system: TTP to LLQ  Central nervous  system: Alert and oriented. No focal neurological deficits. Extremities: no LEE  Data Reviewed: I have personally reviewed following labs and imaging studies  CBC: Recent Labs  Lab 05/31/22 2344 06/01/22 0424 06/02/22 0719  WBC 19.8* 20.1* 14.2*  HGB 13.0 11.9* 9.9*  HCT 38.7 34.0* 29.0*  MCV 90.2 88.1 89.8  PLT 301 212 573    Basic Metabolic Panel: Recent Labs  Lab 05/31/22 2344 06/01/22 0424  NA 135 136  K 3.5 3.9  CL 100 103  CO2 24 23  GLUCOSE 120* 118*  BUN 8 8  CREATININE 0.81 0.75  CALCIUM 9.0 8.5*    GFR: Estimated Creatinine Clearance: 49.8 mL/min (by C-G formula based on SCr of 0.75 mg/dL).  Liver Function Tests: Recent Labs  Lab 05/31/22 2344  AST 18  ALT 12  ALKPHOS 53  BILITOT 0.5  PROT 6.4*  ALBUMIN 3.8    CBG: No results for input(s): "GLUCAP" in the last 168 hours.   No results found for this or any previous visit (from the past 240 hour(s)).       Radiology Studies: CT ABDOMEN PELVIS W CONTRAST  Result Date: 06/01/2022 CLINICAL DATA:  Abdominal pain, acute, nonlocalized. Nausea, vomiting. Urinary hesitancy. EXAM: CT ABDOMEN AND PELVIS WITH CONTRAST TECHNIQUE: Multidetector CT imaging of the abdomen and pelvis was performed using the standard protocol following bolus administration of intravenous contrast. RADIATION DOSE REDUCTION: This exam was performed according to the departmental dose-optimization program which includes automated exposure control, adjustment of the mA and/or kV according to patient size and/or use of iterative reconstruction technique. CONTRAST:  14m OMNIPAQUE IOHEXOL 300 MG/ML  SOLN COMPARISON:  06/20/2021 FINDINGS: Lower chest: Stable 8 mm left lower lobe pulmonary nodule since remote prior examination of 12/25/2004, benign. Mild cardiomegaly. Pericardial calcifications are again identified. Small hiatal hernia. Hepatobiliary: Status post cholecystectomy. Mild intra and extrahepatic biliary ductal dilation is  stable in keeping with post cholecystectomy change. Liver unremarkable. Pancreas: Unremarkable Spleen: Unremarkable Adrenals/Urinary Tract: The adrenal glands are unremarkable. The kidneys are normal in size and position. Kash Mothershead 16 mm enhancing nodule arises exophytically from the interpolar region the left kidney most in keeping with Zariah Cavendish primary renal cell carcinoma. This appears stable in size since prior examination. No hydronephrosis. No intrarenal or ureteral calculi. The bladder is unremarkable. Stomach/Bowel: There is Oviya Ammar 5.3 x 7.3 x 6.2 cm debris and gas filled collection within the left mid abdomen demonstrating communication with Kwabena Strutz Shruti Arrey loop of mid jejunum at axial image # 37/3 most in keeping with Tor Tsuda giant small-bowel diverticulum. This was likely present on prior examination on image # 39/3 of the prior exam but has significantly increased in size since prior examination, where this measured 2.5 cm, and demonstrates significantly increased intraluminal debris. There is now extensive surrounding inflammatory change as well as thickening of the adjacent loop of small bowel in keeping with changes of superimposed small bowel diverticulitis. There is severe descending and sigmoid colonic diverticulosis. There is Tina Temme superimposed focus of acute inflammatory change involving the mid sigmoid colon best seen on axial image # 70/3 in keeping with mild  superimposed acute sigmoid diverticulitis. The inflammatory changes abut the bladder. No evidence of obstruction or perforation. The stomach, small bowel, and large bowel are otherwise unremarkable. No free intraperitoneal gas. Small free fluid within the pelvis and within the perihepatic region Vascular/Lymphatic: Aortic atherosclerosis. No enlarged abdominal or pelvic lymph nodes. Reproductive: Status post hysterectomy. No adnexal masses. Other: Tiny fat containing umbilical hernia Musculoskeletal: Severe compression deformity of L5 with 80% loss of height and retropulsion of  the posterior aspect of the vertebral body appears stable. Marked resultant central canal stenosis again noted. No acute bone abnormality. IMPRESSION: 1. Giant small-bowel diverticulum within the left mid abdomen demonstrating communication with Nikky Duba loop of mid jejunum. This has significantly increased in size since prior examination and demonstrates significantly increased intraluminal debris. There is now extensive surrounding inflammatory change as well as thickening of the adjacent loop of small bowel in keeping with superimposed small bowel diverticulitis. No evidence of obstruction or perforation. 2. Superimposed mild acute sigmoid diverticulitis. No evidence of obstruction or perforation. Inflammatory changes abut the bladder and may account for the patient's urinary symptoms. 3. Stable 16 mm enhancing nodule within the left kidney most in keeping with Namari Breton primary renal cell carcinoma. This should be confirmed with dedicated renal mass protocol CT or MRI examination once the patient's acute issues have resolved. 4. Stable severe compression deformity of L5 with 80% loss of height and retropulsion of the posterior aspect of the vertebral body. Marked resultant central canal stenosis. 5. Mild cardiomegaly. Pericardial calcifications again noted. Aortic Atherosclerosis (ICD10-I70.0). Electronically Signed   By: Fidela Salisbury M.D.   On: 06/01/2022 02:21        Scheduled Meds:  Continuous Infusions:  heparin 900 Units/hr (06/02/22 1042)   lactated ringers 75 mL/hr at 06/02/22 1349   piperacillin-tazobactam (ZOSYN)  IV 3.375 g (06/02/22 1350)     LOS: 0 days    Time spent: over 30 min    Fayrene Helper, MD [facility]   To contact the attending provider between 7A-7P or the covering provider during after hours 7P-7A, please log into the web site [email or web address] and access using universal [place] password for that web site. If you do not have the password, please call the hospital  operator.  06/02/2022, 2:13 PM

## 2022-06-02 NOTE — Progress Notes (Signed)
[place] for heparin Indication: atrial fibrillation  Allergies  Allergen Reactions   Corticosteroids Other (See Comments)    Liver scarring as a child   Covid-19 (Mrna) Vaccine Other (See Comments)    Postural Orthostatic Tachycardia Syndrome (POTS)   Other Other (See Comments)    Cannot take STEROIDS-unknown reaction-caused by internal scarring on liver.    Anesthesia precaution due to excessive liver scarring    Pacerone [Amiodarone] Other (See Comments)    Dizziness Visual hallucinations   Erythromycin Nausea And Vomiting and Rash   Zyloprim [Allopurinol] Rash and Other (See Comments)    Skin peeling More severe gout     Labs: Recent Labs    05/31/22 2344 06/01/22 0202 06/01/22 0424 06/01/22 0527 06/02/22 0719  HGB 13.0  --  11.9*  --  9.9*  HCT 38.7  --  34.0*  --  29.0*  PLT 301  --  212  --  167  APTT  --   --   --  43* 57*  LABPROT 18.6*  --   --   --   --   INR 1.6*  --   --   --   --   HEPARINUNFRC  --   --   --  >1.10* 0.79*  CREATININE 0.81  --  0.75  --   --   TROPONINIHS 5 5  --   --   --      Assessment: 81 y.o. female with medical history significant of A.Fib, HTN, on Xarelto prior to admission. Last dose reported 8/21 at 2100. Hgb/Plt WNL.   Initial PTT subtherapeutic at  57 seconds  Surgery planned for 8/24  Goal of Therapy:  Heparin level 0.3-0.7 units/ml aPTT: 66-102 sec Monitor platelets by anticoagulation protocol: Yes   Plan:  Increase heparin to 900 units / hr Follow up after surgery 06/03/22  Thank you Anette Guarneri, PharmD 06/02/2022 10:04 AM Please check AMION for all [place] numbers

## 2022-06-02 NOTE — Assessment & Plan Note (Signed)
Compression Fx of L5, while severe, is old, stable on imaging and chronic (was chronic even on 06/2021 CT).

## 2022-06-02 NOTE — Progress Notes (Signed)
[facility] Progress Note     Subjective: CC-  Comfortable this morning. No pain at rest but still has abdominal pain when she moves around. Denies n/v. She had 2 Bms yesterday. WBC down 14.2, VSS  Objective: Vital signs in last 24 hours: Temp:  [98.2 F (36.8 C)-99.2 F (37.3 C)] 98.2 F (36.8 C) (08/23 0828) Pulse Rate:  [78-99] 99 (08/23 0828) Resp:  [14-24] 18 (08/23 0828) BP: (94-122)/(47-71) 122/71 (08/23 0828) SpO2:  [92 %-98 %] 95 % (08/23 0828) Last BM Date : 06/01/22  Intake/Output from previous day: 08/22 0701 - 08/23 0700 In: 47.8 [IV Piggyback:47.8] Out: -  Intake/Output this shift: No intake/output data recorded.  PE: Gen:  Alert, NAD, pleasant Abd: soft, minimal distension, mild diffuse tenderness with more moderate right sided TTP without peritonitis  Lab Results:  Recent Labs    06/01/22 0424 06/02/22 0719  WBC 20.1* 14.2*  HGB 11.9* 9.9*  HCT 34.0* 29.0*  PLT 212 167   BMET Recent Labs    05/31/22 2344 06/01/22 0424  NA 135 136  K 3.5 3.9  CL 100 103  CO2 24 23  GLUCOSE 120* 118*  BUN 8 8  CREATININE 0.81 0.75  CALCIUM 9.0 8.5*   PT/INR Recent Labs    05/31/22 2344  LABPROT 18.6*  INR 1.6*   CMP     Component Value Date/Time   NA 136 06/01/2022 0424   NA 138 04/06/2022 1429   K 3.9 06/01/2022 0424   CL 103 06/01/2022 0424   CO2 23 06/01/2022 0424   GLUCOSE 118 (H) 06/01/2022 0424   BUN 8 06/01/2022 0424   BUN 10 04/06/2022 1429   CREATININE 0.75 06/01/2022 0424   CREATININE 0.90 08/12/2016 1022   CALCIUM 8.5 (L) 06/01/2022 0424   PROT 6.4 (L) 05/31/2022 2344   PROT 6.9 11/21/2019 1035   ALBUMIN 3.8 05/31/2022 2344   ALBUMIN 4.5 11/21/2019 1035   AST 18 05/31/2022 2344   ALT 12 05/31/2022 2344   ALKPHOS 53 05/31/2022 2344   BILITOT 0.5 05/31/2022 2344   BILITOT 0.9 11/21/2019 1035   GFRNONAA >60 06/01/2022 0424   GFRAA 66 11/21/2019 1035   Lipase     Component Value Date/Time   LIPASE 44  05/31/2022 2344       Studies/Results: CT ABDOMEN PELVIS W CONTRAST  Result Date: 06/01/2022 CLINICAL DATA:  Abdominal pain, acute, nonlocalized. Nausea, vomiting. Urinary hesitancy. EXAM: CT ABDOMEN AND PELVIS WITH CONTRAST TECHNIQUE: Multidetector CT imaging of the abdomen and pelvis was performed using the standard protocol following bolus administration of intravenous contrast. RADIATION DOSE REDUCTION: This exam was performed according to the departmental dose-optimization program which includes automated exposure control, adjustment of the mA and/or kV according to patient size and/or use of iterative reconstruction technique. CONTRAST:  125m OMNIPAQUE IOHEXOL 300 MG/ML  SOLN COMPARISON:  06/20/2021 FINDINGS: Lower chest: Stable 8 mm left lower lobe pulmonary nodule since remote prior examination of 12/25/2004, benign. Mild cardiomegaly. Pericardial calcifications are again identified. Small hiatal hernia. Hepatobiliary: Status post cholecystectomy. Mild intra and extrahepatic biliary ductal dilation is stable in keeping with post cholecystectomy change. Liver unremarkable. Pancreas: Unremarkable Spleen: Unremarkable Adrenals/Urinary Tract: The adrenal glands are unremarkable. The kidneys are normal in size and position. A 16 mm enhancing nodule arises exophytically from the interpolar region the left kidney most in keeping with a primary renal cell carcinoma. This appears stable in size since prior examination. No hydronephrosis. No intrarenal or ureteral calculi. The bladder  is unremarkable. Stomach/Bowel: There is a 5.3 x 7.3 x 6.2 cm debris and gas filled collection within the left mid abdomen demonstrating communication with a a loop of mid jejunum at axial image # 37/3 most in keeping with a giant small-bowel diverticulum. This was likely present on prior examination on image # 39/3 of the prior exam but has significantly increased in size since prior examination, where this measured 2.5 cm,  and demonstrates significantly increased intraluminal debris. There is now extensive surrounding inflammatory change as well as thickening of the adjacent loop of small bowel in keeping with changes of superimposed small bowel diverticulitis. There is severe descending and sigmoid colonic diverticulosis. There is a superimposed focus of acute inflammatory change involving the mid sigmoid colon best seen on axial image # 70/3 in keeping with mild superimposed acute sigmoid diverticulitis. The inflammatory changes abut the bladder. No evidence of obstruction or perforation. The stomach, small bowel, and large bowel are otherwise unremarkable. No free intraperitoneal gas. Small free fluid within the pelvis and within the perihepatic region Vascular/Lymphatic: Aortic atherosclerosis. No enlarged abdominal or pelvic lymph nodes. Reproductive: Status post hysterectomy. No adnexal masses. Other: Tiny fat containing umbilical hernia Musculoskeletal: Severe compression deformity of L5 with 80% loss of height and retropulsion of the posterior aspect of the vertebral body appears stable. Marked resultant central canal stenosis again noted. No acute bone abnormality. IMPRESSION: 1. Giant small-bowel diverticulum within the left mid abdomen demonstrating communication with a loop of mid jejunum. This has significantly increased in size since prior examination and demonstrates significantly increased intraluminal debris. There is now extensive surrounding inflammatory change as well as thickening of the adjacent loop of small bowel in keeping with superimposed small bowel diverticulitis. No evidence of obstruction or perforation. 2. Superimposed mild acute sigmoid diverticulitis. No evidence of obstruction or perforation. Inflammatory changes abut the bladder and may account for the patient's urinary symptoms. 3. Stable 16 mm enhancing nodule within the left kidney most in keeping with a primary renal cell carcinoma. This should  be confirmed with dedicated renal mass protocol CT or MRI examination once the patient's acute issues have resolved. 4. Stable severe compression deformity of L5 with 80% loss of height and retropulsion of the posterior aspect of the vertebral body. Marked resultant central canal stenosis. 5. Mild cardiomegaly. Pericardial calcifications again noted. Aortic Atherosclerosis (ICD10-I70.0). Electronically Signed   By: Fidela Salisbury M.D.   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 We will hold heparin gtt in the morning prior to surgery. Her CT scan also report acute sigmoid diverticulitis, we will look at this during surgery and determine if anything needs to be done - I discussed possibility of colectomy/colostomy with the patient. She is agreeable to proceed tomorrow. Appreciate cardiology assistance.   ID - zosyn 8/22>> FEN - IVF, NPO VTE - heparin gtt Foley - none  Paroxysmal A Fib - xarelto on hold, heparin gtt. Cards has seen for preop risk stratification, no further cardiac testing recommended, high risk for A fib postop and they have given medication recommendations History of constrictive pericarditis s/p pericardial stripping in 2006 HTN Recent Covid-19 infection (diagnosed 05/19/22)  I reviewed Consultant cardiology notes, hospitalist notes, last 24 h vitals and pain scores, last 48 h intake and output, last 24 h labs and trends, and  last 24 h imaging results    LOS: 0 days    [name], [facility] 06/02/2022, 8:56 AM Please see Amion for pager number during day hours 7:00am-4:30pm

## 2022-06-02 NOTE — Plan of Care (Signed)

## 2022-06-02 NOTE — H&P (View-Only) (Signed)
[facility] Progress Note     Subjective: CC-  Comfortable this morning. No pain at rest but still has abdominal pain when she moves around. Denies n/v. She had 2 Bms yesterday. WBC down 14.2, VSS  Objective: Vital signs in last 24 hours: Temp:  [98.2 F (36.8 C)-99.2 F (37.3 C)] 98.2 F (36.8 C) (08/23 0828) Pulse Rate:  [78-99] 99 (08/23 0828) Resp:  [14-24] 18 (08/23 0828) BP: (94-122)/(47-71) 122/71 (08/23 0828) SpO2:  [92 %-98 %] 95 % (08/23 0828) Last BM Date : 06/01/22  Intake/Output from previous day: 08/22 0701 - 08/23 0700 In: 47.8 [IV Piggyback:47.8] Out: -  Intake/Output this shift: No intake/output data recorded.  PE: Gen:  Alert, NAD, pleasant Abd: soft, minimal distension, mild diffuse tenderness with more moderate right sided TTP without peritonitis  Lab Results:  Recent Labs    06/01/22 0424 06/02/22 0719  WBC 20.1* 14.2*  HGB 11.9* 9.9*  HCT 34.0* 29.0*  PLT 212 167   BMET Recent Labs    05/31/22 2344 06/01/22 0424  NA 135 136  K 3.5 3.9  CL 100 103  CO2 24 23  GLUCOSE 120* 118*  BUN 8 8  CREATININE 0.81 0.75  CALCIUM 9.0 8.5*   PT/INR Recent Labs    05/31/22 2344  LABPROT 18.6*  INR 1.6*   CMP     Component Value Date/Time   NA 136 06/01/2022 0424   NA 138 04/06/2022 1429   K 3.9 06/01/2022 0424   CL 103 06/01/2022 0424   CO2 23 06/01/2022 0424   GLUCOSE 118 (H) 06/01/2022 0424   BUN 8 06/01/2022 0424   BUN 10 04/06/2022 1429   CREATININE 0.75 06/01/2022 0424   CREATININE 0.90 08/12/2016 1022   CALCIUM 8.5 (L) 06/01/2022 0424   PROT 6.4 (L) 05/31/2022 2344   PROT 6.9 11/21/2019 1035   ALBUMIN 3.8 05/31/2022 2344   ALBUMIN 4.5 11/21/2019 1035   AST 18 05/31/2022 2344   ALT 12 05/31/2022 2344   ALKPHOS 53 05/31/2022 2344   BILITOT 0.5 05/31/2022 2344   BILITOT 0.9 11/21/2019 1035   GFRNONAA >60 06/01/2022 0424   GFRAA 66 11/21/2019 1035   Lipase     Component Value Date/Time   LIPASE 44  05/31/2022 2344       Studies/Results: CT ABDOMEN PELVIS W CONTRAST  Result Date: 06/01/2022 CLINICAL DATA:  Abdominal pain, acute, nonlocalized. Nausea, vomiting. Urinary hesitancy. EXAM: CT ABDOMEN AND PELVIS WITH CONTRAST TECHNIQUE: Multidetector CT imaging of the abdomen and pelvis was performed using the standard protocol following bolus administration of intravenous contrast. RADIATION DOSE REDUCTION: This exam was performed according to the departmental dose-optimization program which includes automated exposure control, adjustment of the mA and/or kV according to patient size and/or use of iterative reconstruction technique. CONTRAST:  116m OMNIPAQUE IOHEXOL 300 MG/ML  SOLN COMPARISON:  06/20/2021 FINDINGS: Lower chest: Stable 8 mm left lower lobe pulmonary nodule since remote prior examination of 12/25/2004, benign. Mild cardiomegaly. Pericardial calcifications are again identified. Small hiatal hernia. Hepatobiliary: Status post cholecystectomy. Mild intra and extrahepatic biliary ductal dilation is stable in keeping with post cholecystectomy change. Liver unremarkable. Pancreas: Unremarkable Spleen: Unremarkable Adrenals/Urinary Tract: The adrenal glands are unremarkable. The kidneys are normal in size and position. A 16 mm enhancing nodule arises exophytically from the interpolar region the left kidney most in keeping with a primary renal cell carcinoma. This appears stable in size since prior examination. No hydronephrosis. No intrarenal or ureteral calculi. The bladder  is unremarkable. Stomach/Bowel: There is a 5.3 x 7.3 x 6.2 cm debris and gas filled collection within the left mid abdomen demonstrating communication with a a loop of mid jejunum at axial image # 37/3 most in keeping with a giant small-bowel diverticulum. This was likely present on prior examination on image # 39/3 of the prior exam but has significantly increased in size since prior examination, where this measured 2.5 cm,  and demonstrates significantly increased intraluminal debris. There is now extensive surrounding inflammatory change as well as thickening of the adjacent loop of small bowel in keeping with changes of superimposed small bowel diverticulitis. There is severe descending and sigmoid colonic diverticulosis. There is a superimposed focus of acute inflammatory change involving the mid sigmoid colon best seen on axial image # 70/3 in keeping with mild superimposed acute sigmoid diverticulitis. The inflammatory changes abut the bladder. No evidence of obstruction or perforation. The stomach, small bowel, and large bowel are otherwise unremarkable. No free intraperitoneal gas. Small free fluid within the pelvis and within the perihepatic region Vascular/Lymphatic: Aortic atherosclerosis. No enlarged abdominal or pelvic lymph nodes. Reproductive: Status post hysterectomy. No adnexal masses. Other: Tiny fat containing umbilical hernia Musculoskeletal: Severe compression deformity of L5 with 80% loss of height and retropulsion of the posterior aspect of the vertebral body appears stable. Marked resultant central canal stenosis again noted. No acute bone abnormality. IMPRESSION: 1. Giant small-bowel diverticulum within the left mid abdomen demonstrating communication with a loop of mid jejunum. This has significantly increased in size since prior examination and demonstrates significantly increased intraluminal debris. There is now extensive surrounding inflammatory change as well as thickening of the adjacent loop of small bowel in keeping with superimposed small bowel diverticulitis. No evidence of obstruction or perforation. 2. Superimposed mild acute sigmoid diverticulitis. No evidence of obstruction or perforation. Inflammatory changes abut the bladder and may account for the patient's urinary symptoms. 3. Stable 16 mm enhancing nodule within the left kidney most in keeping with a primary renal cell carcinoma. This should  be confirmed with dedicated renal mass protocol CT or MRI examination once the patient's acute issues have resolved. 4. Stable severe compression deformity of L5 with 80% loss of height and retropulsion of the posterior aspect of the vertebral body. Marked resultant central canal stenosis. 5. Mild cardiomegaly. Pericardial calcifications again noted. Aortic Atherosclerosis (ICD10-I70.0). Electronically Signed   By: Fidela Salisbury M.D.   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 We will hold heparin gtt in the morning prior to surgery. Her CT scan also report acute sigmoid diverticulitis, we will look at this during surgery and determine if anything needs to be done - I discussed possibility of colectomy/colostomy with the patient. She is agreeable to proceed tomorrow. Appreciate cardiology assistance.   ID - zosyn 8/22>> FEN - IVF, NPO VTE - heparin gtt Foley - none  Paroxysmal A Fib - xarelto on hold, heparin gtt. Cards has seen for preop risk stratification, no further cardiac testing recommended, high risk for A fib postop and they have given medication recommendations History of constrictive pericarditis s/p pericardial stripping in 2006 HTN Recent Covid-19 infection (diagnosed 05/19/22)  I reviewed Consultant cardiology notes, hospitalist notes, last 24 h vitals and pain scores, last 48 h intake and output, last 24 h labs and trends, and  last 24 h imaging results    LOS: 0 days    [name], [facility] 06/02/2022, 8:56 AM Please see Amion for pager number during day hours 7:00am-4:30pm

## 2022-06-02 NOTE — Assessment & Plan Note (Signed)
August 9th symptoms started, tested positive 8/10 8/17 she reports home test was negative Has some residual L ear pain, otherwise sx improved

## 2022-06-03 ENCOUNTER — Inpatient Hospital Stay (HOSPITAL_COMMUNITY): Payer: Medicare PPO | Admitting: Certified Registered"

## 2022-06-03 ENCOUNTER — Encounter (HOSPITAL_COMMUNITY): Payer: Self-pay | Admitting: Family Medicine

## 2022-06-03 ENCOUNTER — Other Ambulatory Visit: Payer: Self-pay

## 2022-06-03 ENCOUNTER — Encounter (HOSPITAL_COMMUNITY)
Admission: EM | Disposition: A | Discharge status: Home Health | Payer: Self-pay | Source: Home / Self Care | Attending: Family Medicine

## 2022-06-03 DIAGNOSIS — K5712 Diverticulitis of small intestine without perforation or abscess without bleeding: Secondary | ICD-10-CM | POA: Diagnosis not present

## 2022-06-03 DIAGNOSIS — I10 Essential (primary) hypertension: Secondary | ICD-10-CM | POA: Diagnosis not present

## 2022-06-03 DIAGNOSIS — I4891 Unspecified atrial fibrillation: Secondary | ICD-10-CM | POA: Diagnosis not present

## 2022-06-03 HISTORY — PX: LAPAROTOMY: SHX154

## 2022-06-03 HISTORY — PX: BOWEL RESECTION: SHX1257

## 2022-06-03 LAB — CBC WITH DIFFERENTIAL/PLATELET
Abs Immature Granulocytes: 0.12 10*3/uL — ABNORMAL HIGH (ref 0.00–0.07)
Basophils Absolute: 0 10*3/uL (ref 0.0–0.1)
Basophils Relative: 0 %
Eosinophils Absolute: 0 10*3/uL (ref 0.0–0.5)
Eosinophils Relative: 0 %
HCT: 31.4 % — ABNORMAL LOW (ref 36.0–46.0)
Hemoglobin: 11 g/dL — ABNORMAL LOW (ref 12.0–15.0)
Immature Granulocytes: 1 %
Lymphocytes Relative: 5 %
Lymphs Abs: 0.8 10*3/uL (ref 0.7–4.0)
MCH: 31.2 pg (ref 26.0–34.0)
MCHC: 35 g/dL (ref 30.0–36.0)
MCV: 89 fL (ref 80.0–100.0)
Monocytes Absolute: 0.4 10*3/uL (ref 0.1–1.0)
Monocytes Relative: 2 %
Neutro Abs: 13.3 10*3/uL — ABNORMAL HIGH (ref 1.7–7.7)
Neutrophils Relative %: 92 %
Platelets: 188 10*3/uL (ref 150–400)
RBC: 3.53 MIL/uL — ABNORMAL LOW (ref 3.87–5.11)
RDW: 13.5 % (ref 11.5–15.5)
WBC: 14.6 10*3/uL — ABNORMAL HIGH (ref 4.0–10.5)
nRBC: 0 % (ref 0.0–0.2)

## 2022-06-03 LAB — COMPREHENSIVE METABOLIC PANEL
ALT: 17 U/L (ref 0–44)
AST: 22 U/L (ref 15–41)
Albumin: 3 g/dL — ABNORMAL LOW (ref 3.5–5.0)
Alkaline Phosphatase: 60 U/L (ref 38–126)
Anion gap: 9 (ref 5–15)
BUN: 10 mg/dL (ref 8–23)
CO2: 24 mmol/L (ref 22–32)
Calcium: 8.3 mg/dL — ABNORMAL LOW (ref 8.9–10.3)
Chloride: 102 mmol/L (ref 98–111)
Creatinine, Ser: 0.85 mg/dL (ref 0.44–1.00)
GFR, Estimated: >60 mL/min (ref >60)
Glucose, Bld: 104 mg/dL — ABNORMAL HIGH (ref 70–99)
Potassium: 3.4 mmol/L — ABNORMAL LOW (ref 3.5–5.1)
Sodium: 135 mmol/L (ref 135–145)
Total Bilirubin: 2.3 mg/dL — ABNORMAL HIGH (ref 0.3–1.2)
Total Protein: 6.1 g/dL — ABNORMAL LOW (ref 6.5–8.1)

## 2022-06-03 LAB — APTT: aPTT: 55 seconds — ABNORMAL HIGH (ref 24–36)

## 2022-06-03 LAB — TYPE AND SCREEN
ABO/RH(D): A POS
Antibody Screen: NEGATIVE

## 2022-06-03 LAB — SURGICAL PCR SCREEN
MRSA, PCR: NEGATIVE
Staphylococcus aureus: NEGATIVE

## 2022-06-03 LAB — PHOSPHORUS: Phosphorus: 2 mg/dL — ABNORMAL LOW (ref 2.5–4.6)

## 2022-06-03 LAB — MAGNESIUM: Magnesium: 1.9 mg/dL (ref 1.7–2.4)

## 2022-06-03 SURGERY — LAPAROTOMY, EXPLORATORY
Anesthesia: General | Site: Abdomen

## 2022-06-03 MED ORDER — DEXTROSE 5 % IV SOLN
500.0000 mg | Freq: Four times a day (QID) | INTRAVENOUS | Status: DC | PRN
Start: 2022-06-03 — End: 2022-06-11

## 2022-06-03 MED ORDER — ESMOLOL HCL 100 MG/10ML IV SOLN
INTRAVENOUS | Status: DC | PRN
  Administered 2022-06-03: 20 mg via INTRAVENOUS

## 2022-06-03 MED ORDER — ORAL CARE MOUTH RINSE: 15.0000 mL | Freq: Once | OROMUCOSAL | Status: AC

## 2022-06-03 MED ORDER — PROPOFOL 10 MG/ML IV BOLUS
INTRAVENOUS | Status: DC | PRN
  Administered 2022-06-03: 130 mg via INTRAVENOUS

## 2022-06-03 MED ORDER — ACETAMINOPHEN 160 MG/5ML PO SOLN: 325.0000 mg | Freq: Once | ORAL | Status: DC | PRN

## 2022-06-03 MED ORDER — POTASSIUM CHLORIDE 2 MEQ/ML IV SOLN
INTRAVENOUS | Status: DC
  Filled 2022-06-03 (×2): qty 1000

## 2022-06-03 MED ORDER — ONDANSETRON HCL 4 MG/2ML IJ SOLN
INTRAMUSCULAR | Status: DC | PRN
  Administered 2022-06-03: 4 mg via INTRAVENOUS

## 2022-06-03 MED ORDER — ROCURONIUM BROMIDE 10 MG/ML (PF) SYRINGE
PREFILLED_SYRINGE | INTRAVENOUS | Status: DC | PRN
  Administered 2022-06-03: 20 mg via INTRAVENOUS
  Administered 2022-06-03: 60 mg via INTRAVENOUS

## 2022-06-03 MED ORDER — PHENOL 1.4 % MT LIQD
1.0000 | OROMUCOSAL | Status: DC | PRN
  Filled 2022-06-03: qty 177

## 2022-06-03 MED ORDER — FENTANYL CITRATE (PF) 250 MCG/5ML IJ SOLN
INTRAMUSCULAR | Status: AC
  Filled 2022-06-03: qty 5

## 2022-06-03 MED ORDER — ACETAMINOPHEN 10 MG/ML IV SOLN
1000.0000 mg | Freq: Four times a day (QID) | INTRAVENOUS | Status: AC
Start: 2022-06-03 — End: 2022-06-04
  Administered 2022-06-03 – 2022-06-04 (×4): 1000 mg via INTRAVENOUS
  Filled 2022-06-03 (×4): qty 100

## 2022-06-03 MED ORDER — FENTANYL CITRATE (PF) 250 MCG/5ML IJ SOLN
INTRAMUSCULAR | Status: DC | PRN
  Administered 2022-06-03 (×3): 50 ug via INTRAVENOUS

## 2022-06-03 MED ORDER — HEPARIN (PORCINE) 25000 UT/250ML-% IV SOLN
1500.0000 [IU]/h | INTRAVENOUS | Status: DC
  Administered 2022-06-04: 900 [IU]/h via INTRAVENOUS
  Administered 2022-06-06: 1550 [IU]/h via INTRAVENOUS
  Filled 2022-06-03 (×5): qty 250

## 2022-06-03 MED ORDER — CHLORHEXIDINE GLUCONATE 0.12 % MT SOLN
OROMUCOSAL | Status: AC
  Administered 2022-06-03: 15 mL via OROMUCOSAL
  Filled 2022-06-03: qty 15

## 2022-06-03 MED ORDER — SUGAMMADEX SODIUM 200 MG/2ML IV SOLN
INTRAVENOUS | Status: DC | PRN
  Administered 2022-06-03: 200 mg via INTRAVENOUS

## 2022-06-03 MED ORDER — CHLORHEXIDINE GLUCONATE 0.12 % MT SOLN
15.0000 mL | Freq: Once | OROMUCOSAL | Status: AC
Start: 2022-06-03 — End: 2022-06-03

## 2022-06-03 MED ORDER — LIDOCAINE 2% (20 MG/ML) 5 ML SYRINGE
INTRAMUSCULAR | Status: AC
  Filled 2022-06-03: qty 5

## 2022-06-03 MED ORDER — PROMETHAZINE HCL 25 MG/ML IJ SOLN: 6.2500 mg | INTRAMUSCULAR | Status: DC | PRN

## 2022-06-03 MED ORDER — ACETAMINOPHEN 10 MG/ML IV SOLN: 1000.0000 mg | Freq: Once | INTRAVENOUS | Status: DC | PRN

## 2022-06-03 MED ORDER — BUPIVACAINE LIPOSOME 1.3 % IJ SUSP
INTRAMUSCULAR | Status: DC | PRN
  Administered 2022-06-03: 20 mL

## 2022-06-03 MED ORDER — AMISULPRIDE (ANTIEMETIC) 5 MG/2ML IV SOLN: 10.0000 mg | Freq: Once | INTRAVENOUS | Status: DC | PRN

## 2022-06-03 MED ORDER — SUCCINYLCHOLINE CHLORIDE 200 MG/10ML IV SOSY
PREFILLED_SYRINGE | INTRAVENOUS | Status: AC
  Filled 2022-06-03: qty 10

## 2022-06-03 MED ORDER — ACETAMINOPHEN 325 MG PO TABS: 325.0000 mg | ORAL_TABLET | Freq: Once | ORAL | Status: DC | PRN

## 2022-06-03 MED ORDER — PHENYLEPHRINE 80 MCG/ML (10ML) SYRINGE FOR IV PUSH (FOR BLOOD PRESSURE SUPPORT)
PREFILLED_SYRINGE | INTRAVENOUS | Status: DC | PRN
  Administered 2022-06-03 (×2): 80 ug via INTRAVENOUS

## 2022-06-03 MED ORDER — PIPERACILLIN-TAZOBACTAM 3.375 G IVPB
3.3750 g | Freq: Three times a day (TID) | INTRAVENOUS | Status: AC
  Administered 2022-06-03 – 2022-06-08 (×15): 3.375 g via INTRAVENOUS
  Filled 2022-06-03 (×15): qty 50

## 2022-06-03 MED ORDER — METOPROLOL TARTRATE 5 MG/5ML IV SOLN
5.0000 mg | Freq: Four times a day (QID) | INTRAVENOUS | Status: DC
  Administered 2022-06-03 – 2022-06-08 (×20): 5 mg via INTRAVENOUS
  Filled 2022-06-03 (×19): qty 5

## 2022-06-03 MED ORDER — MORPHINE SULFATE (PF) 2 MG/ML IV SOLN: 2.0000 mg | INTRAVENOUS | Status: DC | PRN

## 2022-06-03 MED ORDER — LIDOCAINE 2% (20 MG/ML) 5 ML SYRINGE
INTRAMUSCULAR | Status: DC | PRN
  Administered 2022-06-03: 40 mg via INTRAVENOUS

## 2022-06-03 MED ORDER — DIPHENHYDRAMINE HCL 50 MG/ML IJ SOLN
INTRAMUSCULAR | Status: DC | PRN
  Administered 2022-06-03: 12.5 mg via INTRAVENOUS

## 2022-06-03 MED ORDER — LACTATED RINGERS IV SOLN: INTRAVENOUS | Status: DC

## 2022-06-03 MED ORDER — FENTANYL CITRATE (PF) 100 MCG/2ML IJ SOLN: 25.0000 ug | INTRAMUSCULAR | Status: DC | PRN

## 2022-06-03 MED ORDER — ROCURONIUM BROMIDE 10 MG/ML (PF) SYRINGE
PREFILLED_SYRINGE | INTRAVENOUS | Status: AC
  Filled 2022-06-03: qty 10

## 2022-06-03 MED ORDER — BUPIVACAINE LIPOSOME 1.3 % IJ SUSP
INTRAMUSCULAR | Status: AC
Start: 2022-06-03 — End: ?
  Filled 2022-06-03: qty 20

## 2022-06-03 MED ORDER — ONDANSETRON HCL 4 MG/2ML IJ SOLN
INTRAMUSCULAR | Status: AC
  Filled 2022-06-03: qty 2

## 2022-06-03 MED ORDER — 0.9 % SODIUM CHLORIDE (POUR BTL) OPTIME
TOPICAL | Status: DC | PRN
  Administered 2022-06-03: 1000 mL

## 2022-06-03 SURGICAL SUPPLY — 53 items
APL PRP STRL LF DISP 70% ISPRP (MISCELLANEOUS) ×1
BLADE CLIPPER SURG (BLADE) IMPLANT
BNDG GAUZE DERMACEA FLUFF (GAUZE/BANDAGES/DRESSINGS) ×1
BNDG GAUZE DERMACEA FLUFF 4 (GAUZE/BANDAGES/DRESSINGS) IMPLANT
BNDG GZE DERMACEA 4 6PLY (GAUZE/BANDAGES/DRESSINGS) ×1
CANISTER SUCT 3000ML PPV (MISCELLANEOUS) ×2 IMPLANT
CHLORAPREP W/TINT 26 (MISCELLANEOUS) ×2 IMPLANT
COVER SURGICAL LIGHT HANDLE (MISCELLANEOUS) ×2 IMPLANT
DRAPE LAPAROSCOPIC ABDOMINAL (DRAPES) ×2 IMPLANT
DRAPE WARM FLUID 44X44 (DRAPES) ×2 IMPLANT
DRSG OPSITE POSTOP 4X10 (GAUZE/BANDAGES/DRESSINGS) IMPLANT
DRSG OPSITE POSTOP 4X8 (GAUZE/BANDAGES/DRESSINGS) IMPLANT
ELECT BLADE 6.5 EXT (BLADE) IMPLANT
ELECT CAUTERY BLADE 6.4 (BLADE) ×2 IMPLANT
ELECT REM PT RETURN 9FT ADLT (ELECTROSURGICAL) ×1
ELECTRODE REM PT RTRN 9FT ADLT (ELECTROSURGICAL) ×2 IMPLANT
GAUZE SPONGE 4X4 12PLY STRL (GAUZE/BANDAGES/DRESSINGS) IMPLANT
GLOVE BIOGEL M STRL SZ7.5 (GLOVE) ×2 IMPLANT
GLOVE INDICATOR 8.0 STRL GRN (GLOVE) ×4 IMPLANT
GOWN STRL REUS W/ TWL LRG LVL3 (GOWN DISPOSABLE) ×2 IMPLANT
GOWN STRL REUS W/TWL 2XL LVL3 (GOWN DISPOSABLE) ×2 IMPLANT
GOWN STRL REUS W/TWL LRG LVL3 (GOWN DISPOSABLE) ×1
HANDLE SUCTION POOLE (INSTRUMENTS) ×2 IMPLANT
KIT BASIN OR (CUSTOM PROCEDURE TRAY) ×2 IMPLANT
KIT TURNOVER KIT B (KITS) ×2 IMPLANT
LIGASURE IMPACT 36 18CM CVD LR (INSTRUMENTS) IMPLANT
NDL HYPO 25GX1X1/2 BEV (NEEDLE) IMPLANT
NEEDLE HYPO 25GX1X1/2 BEV (NEEDLE) ×1 IMPLANT
NS IRRIG 1000ML POUR BTL (IV SOLUTION) ×4 IMPLANT
PACK GENERAL/GYN (CUSTOM PROCEDURE TRAY) ×2 IMPLANT
PAD ARMBOARD 7.5X6 YLW CONV (MISCELLANEOUS) ×2 IMPLANT
PENCIL SMOKE EVACUATOR (MISCELLANEOUS) ×2 IMPLANT
RELOAD STAPLE 60 2.6 WHT THN (STAPLE) IMPLANT
RELOAD STAPLE 60 3.6 BLU REG (STAPLE) IMPLANT
RELOAD STAPLER BLUE 60MM (STAPLE) ×1 IMPLANT
RELOAD STAPLER WHITE 60MM (STAPLE) ×3 IMPLANT
SPECIMEN JAR LARGE (MISCELLANEOUS) IMPLANT
STAPLE ECHEON FLEX 60 POW ENDO (STAPLE) IMPLANT
STAPLER RELOAD BLUE 60MM (STAPLE) ×1
STAPLER RELOAD WHITE 60MM (STAPLE) ×3
STAPLER VISISTAT 35W (STAPLE) ×2 IMPLANT
SUCTION POOLE HANDLE (INSTRUMENTS) ×1
SUT PDS AB 1 TP1 96 (SUTURE) ×4 IMPLANT
SUT SILK 2 0 SH CR/8 (SUTURE) ×2 IMPLANT
SUT SILK 2 0 TIES 10X30 (SUTURE) ×2 IMPLANT
SUT SILK 3 0 SH CR/8 (SUTURE) ×2 IMPLANT
SUT SILK 3 0 TIES 10X30 (SUTURE) ×2 IMPLANT
SUT VIC AB 3-0 SH 18 (SUTURE) IMPLANT
SYR CONTROL 10ML LL (SYRINGE) IMPLANT
TAPE CLOTH SURG 4X10 WHT LF (GAUZE/BANDAGES/DRESSINGS) IMPLANT
TOWEL GREEN STERILE (TOWEL DISPOSABLE) ×2 IMPLANT
TRAY FOLEY MTR SLVR 16FR STAT (SET/KITS/TRAYS/PACK) IMPLANT
YANKAUER SUCT BULB TIP NO VENT (SUCTIONS) IMPLANT

## 2022-06-03 NOTE — Op Note (Signed)
06/03/2022   1:07 PM   PATIENT:  Yolanda Bell  81 y.o. female   PRE-OPERATIVE DIAGNOSIS:  JEJUNAL DIVERTICULITIS   POST-OPERATIVE DIAGNOSIS:  JEJUNAL DIVERTICULITIS with necrosis   PROCEDURE:  Procedure(s): EXPLORATORY LAPAROTOMY (N/A) SMALL BOWEL RESECTION (N/A)   SURGEON:  Surgeon(s) and Role:    Greer Pickerel, MD - Primary   PHYSICIAN ASSISTANT:    ASSISTANTS: none    ANESTHESIA:   general   EBL:  50 mL    BLOOD ADMINISTERED:none   DRAINS: Nasogastric Tube and Urinary Catheter (Foley)    INDICATIONS FOR PROCEDURE: Patient is a 81 year old female who came in with abdominal pain the other day.  A CT scan demonstrated small bowel diverticulitis.  The patient was on a DOAC.  She was not septic and did not require emergent surgery however given the nature of her underlying problem being small bowel diverticulitis it was felt that in her best long-term interest laparotomy with small bowel resection for this area would be best in order to prevent worsening clinical picture.  The risk and benefits were separately documented and discussed with the patient and her son preoperatively.  PROCEDURE: After obtaining informed consent the patient was taken the OR 1 at [facility] and placed supine on the operating room table.  General endotracheal anesthesia was established.  Sequential compression devices were placed.  A Foley catheter had been placed.  Her abdomen was prepped and draped in the usual standard surgical fashion with ChloraPrep.  The patient was on scheduled therapeutic antibiotics.  The patient had had a prior lower midline incision as well as a right subcostal incision.  Surgical timeout was performed.  An upper midline incision was made sharply with a 10 blade.  Soft tissue was divided with electrocautery.  The fascia was incised and the abdominal cavity was entered.  There was murky ascites throughout the abdomen.  A wound protector was placed.  A Balfour retractor was  placed.  The transverse colon was lifted up and the small bowel was identified.  I started running her small bowel from the ligament of Treitz.  At about a foot and a half from the ligament of Treitz there is a section of small bowel that had numerous small bowel diverticuli and a large diverticuli that was densely adhered to the descending colon mesentery.  I was able to finger fracture this off the descending colon mesentery this revealed a large small bowel jejunal diverticuli that had necrosis in it.  I would probably say this had a contained perforation.  There was not an abscess cavity per se.  There is just a lot of murky ascites throughout the abdomen.  It was purulent in nature.  I ran the rest of the small bowel down to the terminal ileum.  There is some additional scattered small bowel diverticuli.  I decided to just resect the small bowel that had the obvious necrotic small bowel jejunal diverticuli and there is a fair amount of moderate-sized diverticuli upstream from that so I decided to resect that segment.  A window was made in the mesentery next to the small bowel and the small bowel was divided with the Echelon stapler with a 60 mm white load.  Identified a segment of small bowel just downstream from the necrotic small bowel diverticuli and again in the similar fashion made a mesenteric window and divided the small bowel with another fire of the Echelon stapler 60 with a white load.  The mesentery was  then taken down sequential fashion with LigaSure.  The specimen was passed off the field.  I then aligned the small bowel in a side-to-side fashion to create an end-to-end functional anastomosis.  The corners of each of the staple line were excised and one limb of the Echelon 60 stapler with a white load was placed through each of the enterotomies.  The stapler was fired to create a common channel.  I then closed the common defect with a single firing of the Echelon 60 stapler with a blue load.  There  was about a 1 mm opening in the staple line.  I was able to close it with 3-0 silk suture.  I then Lemberted the area with 3-0 silk sutures.  I then closed the mesenteric defect with a figure-of-eight 2-0 silk sutures.  I then imbricated the staple line corner from the common closure with 3-0 silk sutures.  The staple line was well tucked.  A tension relieving 3-0 silk suture was placed in the crotch of the anastomosis.  The abdomen is then irrigated with 4 L of saline.  I then infiltrated Exparel in the fascia.  The abdomen was then closed with a #1 looped PDS 1 from a below wound from above and tied centrally.  Skin was left open and packed with moist gauze.  All needle, instrument sponge counts were correct x2.  There were no immediate complications.  The patient Toller suture well.  CASE DATA:  Type of patient?: DOW CASE (Surgical Hospitalist [facility])  Status of Case? URGENT Add On  Infection Present At Time Of Surgery (PATOS)?  PURULENCE at the in the abdominal cavity - murky purulent ascites      LOCAL MEDICATIONS USED:  OTHER exparel   SPECIMEN:  Source of Specimen:  jejunal diverticulitis   DISPOSITION OF SPECIMEN:  PATHOLOGY   COUNTS:  YES   TOURNIQUET:  * No tourniquets in log *   DICTATION: .Dragon Dictation   PLAN OF CARE:  already in pt   PATIENT DISPOSITION:  PACU - hemodynamically stable.   Delay start of Pharmacological VTE agent (>24hrs) due to surgical blood loss or risk of bleeding: no   Leighton Ruff. Redmond Pulling, MD, FACS General, Bariatric, & Minimally Invasive Surgery [facility], [place]

## 2022-06-03 NOTE — Interval H&P Note (Signed)
History and Physical Interval Note:  06/03/2022 10:38 AM  [name]  has presented today for surgery, with the diagnosis of JEJUNAL DIVERTICULITIS.  The various methods of treatment have been discussed with the patient and family. After consideration of risks, benefits and other options for treatment, the patient has consented to  Procedure(s): EXPLORATORY LAPAROTOMY (N/A) as a surgical intervention.  The patient's history has been reviewed, patient examined, no change in status, stable for surgery.  I have reviewed the patient's chart and labs.  Questions were answered to the patient's satisfaction.    With bowel resection  Son at [facility]  I discussed the procedure in detail.    We discussed the risks and benefits of surgery including, but not limited to bleeding, infection (such as wound infection, abdominal abscess), injury to surrounding structures, blood clot formation, urinary retention, incisional hernia, anastomotic stricture, anastomotic leak, anesthesia risks, pulmonary & cardiac complications such as pneumonia &/or heart attack, afib need for additional procedures, ileus, & prolonged hospitalization.  We discussed the typical postoperative recovery course, including limitations & restrictions postoperatively. I explained that the likelihood of improvement in their symptoms is good. We discussed she is a little higher risk from cardiac perspective. Discussed possibility of SNF rehab  Yolanda Bell

## 2022-06-03 NOTE — Anesthesia Procedure Notes (Signed)
Procedure Name: Intubation Date/Time: 06/03/2022 11:09 AM  Performed by: Imagene Riches, CRNAPre-anesthesia Checklist: Patient identified, Emergency Drugs available, Suction available and Patient being monitored Patient Re-evaluated:Patient Re-evaluated prior to induction Oxygen Delivery Method: Circle System Utilized Preoxygenation: Pre-oxygenation with 100% oxygen Induction Type: IV induction Ventilation: Mask ventilation without difficulty Laryngoscope Size: Glidescope and 4 Grade View: Grade I Tube type: Oral Tube size: 7.0 mm Number of attempts: 1 Airway Equipment and Method: Stylet and Oral airway Placement Confirmation: ETT inserted through vocal cords under direct vision, positive ETCO2 and breath sounds checked- equal and bilateral Secured at: 21 cm Tube secured with: Tape Dental Injury: Teeth and Oropharynx as per pre-operative assessment  Difficulty Due To: Difficulty was anticipated and Difficult Airway- due to immobile epiglottis Comments: Elective glide scope intubation due to previous difficult intubation

## 2022-06-03 NOTE — Anesthesia Postprocedure Evaluation (Signed)
Anesthesia Post Note  Patient: Yolanda Bell  Procedure(s) Performed: EXPLORATORY LAPAROTOMY (Abdomen) SMALL BOWEL RESECTION (Abdomen)     Patient location during evaluation: PACU Anesthesia Type: General Level of consciousness: awake and alert Pain management: pain level controlled Vital Signs Assessment: post-procedure vital signs reviewed and stable Respiratory status: spontaneous breathing, nonlabored ventilation, respiratory function stable and patient connected to nasal cannula oxygen Cardiovascular status: blood pressure returned to baseline and stable Postop Assessment: no apparent nausea or vomiting Anesthetic complications: yes   Encounter Notable Events  Notable Event Outcome Phase Comment  Difficult to intubate - expected  Intraprocedure Filed from anesthesia note documentation.    Last Vitals:  Vitals:   06/03/22 1400 06/03/22 1402  BP: (!) 100/57   Pulse: 78   Resp: 16   Temp:  37.1 C  SpO2: 91% 93%    Last Pain:  Vitals:   06/03/22 1402  TempSrc: Oral  PainSc:                  Effie Berkshire

## 2022-06-03 NOTE — Plan of Care (Signed)

## 2022-06-03 NOTE — Anesthesia Preprocedure Evaluation (Addendum)
Anesthesia Evaluation  Patient identified by MRN, date of birth, ID band Patient awake    Reviewed: Allergy & Precautions, NPO status , Patient's Chart, lab work & pertinent test results  Airway Mallampati: I  TM Distance: >3 FB Neck ROM: Full    Dental  (+) Teeth Intact, Dental Advisory Given   Pulmonary neg pulmonary ROS,    breath sounds clear to auscultation       Cardiovascular hypertension, Pt. on home beta blockers and Pt. on medications + dysrhythmias Atrial Fibrillation  Rhythm:Regular Rate:Normal     Neuro/Psych negative neurological ROS     GI/Hepatic negative GI ROS, Neg liver ROS,   Endo/Other  negative endocrine ROS  Renal/GU negative Renal ROS     Musculoskeletal   Abdominal Normal abdominal exam  (+)   Peds  Hematology negative hematology ROS (+)   Anesthesia Other Findings   Reproductive/Obstetrics                            Anesthesia Physical Anesthesia Plan  ASA: 3  Anesthesia Plan: General   Post-op Pain Management:    Induction: Intravenous  PONV Risk Score and Plan: 4 or greater and Ondansetron, Dexamethasone and Treatment may vary due to age or medical condition  Airway Management Planned: Oral ETT  Additional Equipment: None  Intra-op Plan:   Post-operative Plan: Extubation in OR  Informed Consent: I have reviewed the patients History and Physical, chart, labs and discussed the procedure including the risks, benefits and alternatives for the proposed anesthesia with the patient or authorized representative who has indicated his/her understanding and acceptance.     Dental advisory given  Plan Discussed with: CRNA  Anesthesia Plan Comments:        Anesthesia Quick Evaluation

## 2022-06-03 NOTE — Progress Notes (Signed)
PROGRESS NOTE    Yolanda Bell  VHQ:[id number] DOB: 02-12-1941 DOA: 05/31/2022 PCP: Faustino Congress, NP  Chief Complaint  Patient presents with   Abdominal Pain    Brief Narrative:  81 yo with hx atrial fibrillation on xarelto, HTN, recent covid 19 infection who presented with diffuse abdominal pain, nausea, and vomiting as well as urinary urgency/hesitation.  See below for additional details    Assessment & Plan:   Principal Problem:   Diverticulitis of small bowel Active Problems:   Diverticulitis large intestine   Urinary urgency   Hypertension   PAF (paroxysmal atrial fibrillation) (HCC)   Nodule of kidney   Compression fracture of lumbar vertebra (HCC)   History of COVID-19   History of liver disease   Diverticulitis   Assessment and Plan: * Diverticulitis of small bowel Giant small bowel diverticulum, (apparently of jejunum) with acute diverticulitis.  Diverticulum measures 5.3 x 7.3 x 6.2 cm up from 2.5cm in 2022. Also mild diverticulitis of sigmoid. Inflammation about bladder likely causing sensation of urinary urgency. NPO IVF Zosyn Morphine PRN pain Switching Xarelto to heparin gtt Appreciate general surgery recs ->now s/p small bowel resection and ex lap on 8/24  Diverticulitis large intestine abx Appreciate general surgery recs  Urinary urgency Inflammation about bladder from diverticulitis likely to be causing todays sensation of urinary urgency / retention (as noted on CT scan).  Pt doesn't appear to actually be retaining from bladder scans. UA not c/w UTI    Hypertension Cont BB Hold diuretics.  PAF (paroxysmal atrial fibrillation) (HCC) Cont BB. Hold Xarelto, Heparin per pharm to resume 8/25 AM On scheduled IV metoprolol for now Seen by cardiology, note can hold anticoagulation as needed, continue beta blocker, high risk for post surgical atrial fib  Nodule of kidney Concerning for renal cell carcinoma Needs additional imaging    Compression fracture of lumbar vertebra (HCC) Compression Fx of L5, while severe, is old, stable on imaging and chronic (was chronic even on 06/2021 CT).  History of COVID-19 August 9th symptoms started, tested positive 8/10 8/17 she reports home test was negative Has some residual L ear pain, otherwise sx improved  History of liver disease Unclear hx surrounding this Related to steroids/volume? unclear Would recommend follow up outpatient     DVT prophylaxis: heparin gtt Code Status: full Family Communication: none Disposition:   Status is: Observation The patient will require care spanning > 2 midnights and should be moved to inpatient because: need for abx/surgery   Consultants:  Surgery cardiology  Procedures:  none  Antimicrobials:  Anti-infectives (From admission, onward)    Start     Dose/Rate Route Frequency Ordered Stop   06/03/22 1400  piperacillin-tazobactam (ZOSYN) IVPB 3.375 g        3.375 g 12.5 mL/hr over 240 Minutes Intravenous Every 8 hours 06/03/22 1346 06/08/22 1359   06/01/22 1200  piperacillin-tazobactam (ZOSYN) IVPB 3.375 g  Status:  Discontinued        3.375 g 12.5 mL/hr over 240 Minutes Intravenous Every 8 hours 06/01/22 0335 06/03/22 1346   06/01/22 0315  piperacillin-tazobactam (ZOSYN) IVPB 3.375 g        3.375 g 100 mL/hr over 30 Minutes Intravenous  Once 06/01/22 0305 06/01/22 0408       Subjective: Seen prior to surgery No new complaints  Objective: Vitals:   06/03/22 1320 06/03/22 1335 06/03/22 1400 06/03/22 1402  BP: (!) 121/59 113/61 (!) 100/57   Pulse: 89 89 78   Resp: 20 (!)  22 16   Temp:    98.7 F (37.1 C)  TempSrc:    Oral  SpO2: 95% 100% 91% 93%  Weight:      Height:        Intake/Output Summary (Last 24 hours) at 06/03/2022 1554 Last data filed at 06/03/2022 1530 Gross per 24 hour  Intake 5233.39 ml  Output 300 ml  Net 4933.39 ml   Filed Weights   05/31/22 2332 06/03/22 0939  Weight: 63 kg 63 kg     Examination:  General: No acute distress. Cardiovascular: RRR Lungs: unlabored Abdomen: mild TTP diffusely  Neurological: Alert and oriented 3. Moves all extremities 4 with equal strength. Cranial nerves II through XII grossly intact. Extremities: No clubbing or cyanosis. No edema.  Data Reviewed: I have personally reviewed following labs and imaging studies  CBC: Recent Labs  Lab 05/31/22 2344 06/01/22 0424 06/02/22 0719 06/03/22 0225  WBC 19.8* 20.1* 14.2* 14.6*  NEUTROABS  --   --   --  13.3*  HGB 13.0 11.9* 9.9* 11.0*  HCT 38.7 34.0* 29.0* 31.4*  MCV 90.2 88.1 89.8 89.0  PLT 301 212 167 099    Basic Metabolic Panel: Recent Labs  Lab 05/31/22 2344 06/01/22 0424 06/03/22 0225  NA 135 136 135  K 3.5 3.9 3.4*  CL 100 103 102  CO2 '24 23 24  '$ GLUCOSE 120* 118* 104*  BUN '8 8 10  '$ CREATININE 0.81 0.75 0.85  CALCIUM 9.0 8.5* 8.3*  MG  --   --  1.9  PHOS  --   --  2.0*    GFR: Estimated Creatinine Clearance: 47.2 mL/min (by C-G formula based on SCr of 0.85 mg/dL).  Liver Function Tests: Recent Labs  Lab 05/31/22 2344 06/03/22 0225  AST 18 22  ALT 12 17  ALKPHOS 53 60  BILITOT 0.5 2.3*  PROT 6.4* 6.1*  ALBUMIN 3.8 3.0*    CBG: No results for input(s): "GLUCAP" in the last 168 hours.   Recent Results (from the past 240 hour(s))  Surgical PCR screen     Status: None   Collection Time: 06/03/22  2:52 AM   Specimen: Nasal Mucosa; Nasal Swab  Result Value Ref Range Status   MRSA, PCR NEGATIVE NEGATIVE Final   Staphylococcus aureus NEGATIVE NEGATIVE Final    Comment: (NOTE) The Xpert SA Assay (FDA approved for NASAL specimens in patients 35 years of age and older), is one component of [facility] Pion comprehensive surveillance program. It is not intended to diagnose infection nor to guide or monitor treatment. Performed at [facility], [place] [address], [place], [place] [address]          Radiology Studies: No results found.      Scheduled  Meds:  metoprolol tartrate  5 mg Intravenous Q6H   Continuous Infusions:  acetaminophen Stopped (06/03/22 1435)   [START ON 06/04/2022] heparin     lactated ringers     methocarbamol (ROBAXIN) IV     piperacillin-tazobactam (ZOSYN)  IV 12.5 mL/hr at 06/03/22 1530     LOS: 1 day    Time spent: over 30 min    Fayrene Helper, MD [facility]   To contact the attending provider between 7A-7P or the covering provider during after hours 7P-7A, please log into the web site [email or web address] and access using universal [place] password for that web site. If you do not have the password, please call the hospital operator.  06/03/2022, 3:54 PM

## 2022-06-03 NOTE — Brief Op Note (Signed)
06/03/2022  1:07 PM  PATIENT:  Yolanda Bell  81 y.o. female  PRE-OPERATIVE DIAGNOSIS:  JEJUNAL DIVERTICULITIS  POST-OPERATIVE DIAGNOSIS:  JEJUNAL DIVERTICULITIS  PROCEDURE:  Procedure(s): EXPLORATORY LAPAROTOMY (N/A) SMALL BOWEL RESECTION (N/A)  SURGEON:  Surgeon(s) and Role:    Greer Pickerel, MD - Primary  PHYSICIAN ASSISTANT:   ASSISTANTS: none   ANESTHESIA:   general  EBL:  50 mL   BLOOD ADMINISTERED:none  DRAINS: Nasogastric Tube and Urinary Catheter (Foley)   LOCAL MEDICATIONS USED:  OTHER exparel  SPECIMEN:  Source of Specimen:  jejunal diverticulitis  DISPOSITION OF SPECIMEN:  PATHOLOGY  COUNTS:  YES  TOURNIQUET:  * No tourniquets in log *  DICTATION: .Dragon Dictation  PLAN OF CARE:  already in pt  PATIENT DISPOSITION:  PACU - hemodynamically stable.   Delay start of Pharmacological VTE agent (>24hrs) due to surgical blood loss or risk of bleeding: no  Leighton Ruff. Redmond Pulling, MD, FACS General, Bariatric, & Minimally Invasive Surgery [facility], [place]

## 2022-06-03 NOTE — Transfer of Care (Signed)
Immediate Anesthesia Transfer of Care Note  Patient: Yolanda Bell  Procedure(s) Performed: EXPLORATORY LAPAROTOMY (Abdomen) SMALL BOWEL RESECTION (Abdomen)  Patient Location: PACU  Anesthesia Type:General  Level of Consciousness: drowsy  Airway & Oxygen Therapy: Patient Spontanous Breathing and Patient connected to nasal cannula oxygen  Post-op Assessment: Report given to RN and Post -op Vital signs reviewed and stable  Post vital signs: Reviewed and stable  Last Vitals:  Vitals Value Taken Time  BP    Temp    Pulse    Resp    SpO2      Last Pain:  Vitals:   06/03/22 0948  TempSrc:   PainSc: 0-No pain         Complications:  Encounter Notable Events  Notable Event Outcome Phase Comment  Difficult to intubate - expected  Intraprocedure Filed from anesthesia note documentation.

## 2022-06-03 NOTE — Plan of Care (Signed)
A&Ox4, standby assist, BSC - multiple diarrhea incidents overnight. HR tachy overnight, resolved w/ home rx dose of metoprolol. NPO.    Problem: Education: Goal: Knowledge of General Education information will improve Description: Including pain rating scale, medication(s)/side effects and non-pharmacologic comfort measures Outcome: Progressing   Problem: Health Behavior/Discharge Planning: Goal: Ability to manage health-related needs will improve Outcome: Progressing   Problem: Clinical Measurements: Goal: Ability to maintain clinical measurements within normal limits will improve Outcome: Progressing Goal: Will remain free from infection Outcome: Progressing Goal: Diagnostic test results will improve Outcome: Progressing Goal: Respiratory complications will improve Outcome: Progressing Goal: Cardiovascular complication will be avoided Outcome: Progressing   Problem: Activity: Goal: Risk for activity intolerance will decrease Outcome: Progressing   Problem: Nutrition: Goal: Adequate nutrition will be maintained Outcome: Progressing   Problem: Coping: Goal: Level of anxiety will decrease Outcome: Progressing   Problem: Elimination: Goal: Will not experience complications related to bowel motility Outcome: Progressing Goal: Will not experience complications related to urinary retention Outcome: Progressing   Problem: Pain Managment: Goal: General experience of comfort will improve Outcome: Progressing   Problem: Safety: Goal: Ability to remain free from injury will improve Outcome: Progressing   Problem: Skin Integrity: Goal: Risk for impaired skin integrity will decrease Outcome: Progressing

## 2022-06-04 ENCOUNTER — Encounter (HOSPITAL_COMMUNITY): Payer: Self-pay | Admitting: General Surgery

## 2022-06-04 DIAGNOSIS — K5712 Diverticulitis of small intestine without perforation or abscess without bleeding: Secondary | ICD-10-CM | POA: Diagnosis not present

## 2022-06-04 LAB — BASIC METABOLIC PANEL
Anion gap: 7 (ref 5–15)
BUN: 10 mg/dL (ref 8–23)
CO2: 26 mmol/L (ref 22–32)
Calcium: 7.5 mg/dL — ABNORMAL LOW (ref 8.9–10.3)
Chloride: 106 mmol/L (ref 98–111)
Creatinine, Ser: 0.73 mg/dL (ref 0.44–1.00)
GFR, Estimated: >60 mL/min (ref >60)
Glucose, Bld: 89 mg/dL (ref 70–99)
Potassium: 3.6 mmol/L (ref 3.5–5.1)
Sodium: 139 mmol/L (ref 135–145)

## 2022-06-04 LAB — CBC
HCT: 28.3 % — ABNORMAL LOW (ref 36.0–46.0)
Hemoglobin: 9.6 g/dL — ABNORMAL LOW (ref 12.0–15.0)
MCH: 30.2 pg (ref 26.0–34.0)
MCHC: 33.9 g/dL (ref 30.0–36.0)
MCV: 89 fL (ref 80.0–100.0)
Platelets: 200 10*3/uL (ref 150–400)
RBC: 3.18 MIL/uL — ABNORMAL LOW (ref 3.87–5.11)
RDW: 13.4 % (ref 11.5–15.5)
WBC: 7.2 10*3/uL (ref 4.0–10.5)
nRBC: 0 % (ref 0.0–0.2)

## 2022-06-04 LAB — COMPREHENSIVE METABOLIC PANEL
ALT: 14 U/L (ref 0–44)
AST: 19 U/L (ref 15–41)
Albumin: 2 g/dL — ABNORMAL LOW (ref 3.5–5.0)
Alkaline Phosphatase: 47 U/L (ref 38–126)
Anion gap: 9 (ref 5–15)
BUN: 10 mg/dL (ref 8–23)
CO2: 22 mmol/L (ref 22–32)
Calcium: 7.4 mg/dL — ABNORMAL LOW (ref 8.9–10.3)
Chloride: 106 mmol/L (ref 98–111)
Creatinine, Ser: 0.71 mg/dL (ref 0.44–1.00)
GFR, Estimated: >60 mL/min (ref >60)
Glucose, Bld: 97 mg/dL (ref 70–99)
Potassium: 3.5 mmol/L (ref 3.5–5.1)
Sodium: 137 mmol/L (ref 135–145)
Total Bilirubin: 1.4 mg/dL — ABNORMAL HIGH (ref 0.3–1.2)
Total Protein: 4.6 g/dL — ABNORMAL LOW (ref 6.5–8.1)

## 2022-06-04 LAB — HEPARIN LEVEL (UNFRACTIONATED)
Heparin Unfractionated: 0.15 IU/mL — ABNORMAL LOW (ref 0.30–0.70)
Heparin Unfractionated: 0.11 IU/mL — ABNORMAL LOW (ref 0.30–0.70)

## 2022-06-04 LAB — CBC WITH DIFFERENTIAL/PLATELET
Abs Immature Granulocytes: 0.06 10*3/uL (ref 0.00–0.07)
Basophils Absolute: 0 10*3/uL (ref 0.0–0.1)
Basophils Relative: 0 %
Eosinophils Absolute: 0 10*3/uL (ref 0.0–0.5)
Eosinophils Relative: 0 %
HCT: 28.3 % — ABNORMAL LOW (ref 36.0–46.0)
Hemoglobin: 9.7 g/dL — ABNORMAL LOW (ref 12.0–15.0)
Immature Granulocytes: 1 %
Lymphocytes Relative: 5 %
Lymphs Abs: 0.5 10*3/uL — ABNORMAL LOW (ref 0.7–4.0)
MCH: 30.6 pg (ref 26.0–34.0)
MCHC: 34.3 g/dL (ref 30.0–36.0)
MCV: 89.3 fL (ref 80.0–100.0)
Monocytes Absolute: 0.4 10*3/uL (ref 0.1–1.0)
Monocytes Relative: 5 %
Neutro Abs: 8.2 10*3/uL — ABNORMAL HIGH (ref 1.7–7.7)
Neutrophils Relative %: 89 %
Platelets: 161 10*3/uL (ref 150–400)
RBC: 3.17 MIL/uL — ABNORMAL LOW (ref 3.87–5.11)
RDW: 13.4 % (ref 11.5–15.5)
WBC: 9.2 10*3/uL (ref 4.0–10.5)
nRBC: 0 % (ref 0.0–0.2)

## 2022-06-04 LAB — APTT: aPTT: 44 seconds — ABNORMAL HIGH (ref 24–36)

## 2022-06-04 LAB — MAGNESIUM: Magnesium: 1.8 mg/dL (ref 1.7–2.4)

## 2022-06-04 LAB — PHOSPHORUS: Phosphorus: 3 mg/dL (ref 2.5–4.6)

## 2022-06-04 MED ORDER — ACETAMINOPHEN 10 MG/ML IV SOLN
1000.0000 mg | Freq: Four times a day (QID) | INTRAVENOUS | Status: DC
  Administered 2022-06-04 – 2022-06-05 (×3): 1000 mg via INTRAVENOUS
  Filled 2022-06-04 (×5): qty 100

## 2022-06-04 NOTE — Progress Notes (Signed)
[place] for Heparin Indication: atrial fibrillation  Allergies  Allergen Reactions   Corticosteroids Other (See Comments)    Liver scarring as a child   Covid-19 (Mrna) Vaccine Other (See Comments)    Postural Orthostatic Tachycardia Syndrome (POTS)   Other Other (See Comments)    Cannot take STEROIDS-unknown reaction-caused by internal scarring on liver.    Anesthesia precaution due to excessive liver scarring    Pacerone [Amiodarone] Other (See Comments)    Dizziness Visual hallucinations   Erythromycin Nausea And Vomiting and Rash   Zyloprim [Allopurinol] Rash and Other (See Comments)    Skin peeling More severe gout     Patient Measurements: Height: 5' 2.25" (158.1 cm) Weight: 63 kg (139 lb) IBW/kg (Calculated) : 50.68  Heparin Dosing Weight: 63 kg  Vital Signs: Temp: 98.2 F (36.8 C) (08/25 0854) Temp Source: Oral (08/25 0854) BP: 124/63 (08/25 0854) Pulse Rate: 91 (08/25 0854)  Labs: Recent Labs    06/02/22 0719 06/03/22 0225 06/04/22 0119 06/04/22 1355  HGB 9.9* 11.0* 9.7*  --   HCT 29.0* 31.4* 28.3*  --   PLT 167 188 161  --   APTT 57* 55*  --   --   HEPARINUNFRC 0.79*  --   --  0.15*  CREATININE  --  0.85 0.71  --     Estimated Creatinine Clearance: 50.1 mL/min (by C-G formula based on SCr of 0.71 mg/dL).    Assessment: 81 y.o. female with medical history significant of A.Fib on Xarelto prior to admission. Last dose reported 8/21 at 2100. Pharmacy consulted to manage heparin while Xarelto on hold.   Heparin infusion restarted 8/25 AM @ 900 units/hr. Heparin level subtherapeutic 0.15. No issues with heparin infusion or signs bleeding reported. CBC stable.   Goal of Therapy:  Heparin level 0.3-0.7 units/ml Monitor platelets by anticoagulation protocol: Yes   Plan:  Increase heparin infusion to 1050 units/hr Check heparin level in 8 hours and daily while on heparin Continue to monitor H&H and  platelets    Thank you for allowing pharmacy to be a part of this patient's care.  Ardyth Harps, PharmD Clinical Pharmacist

## 2022-06-04 NOTE — Care Management Important Message (Signed)
Important Message  Patient Details  Name: Yolanda Bell MRN: [id number] Date of Birth: 30-Jun-1941   Medicare Important Message Given:  Yes     Hannah Beat 06/04/2022, 12:13 PM

## 2022-06-04 NOTE — Evaluation (Signed)
Physical Therapy Evaluation Patient Details Name: Yolanda Bell MRN: [id number] DOB: August 25, 1941 Today's Date: 06/04/2022  History of Present Illness  81 yo admitted who presented with diffuse abdominal pain, nausea, and vomiting as well as urinary urgency/hesitation.  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 PMH:  atrial fibrillation on xarelto, HTN, recent covid 19 infection  Clinical Impression  Pt admitted with above diagnosis. Pt able to stand and pivot to chair with min assist today with HHA for support. Pt limited somewhat by NG tube to wall suction.  Pt should progress well.  Pt currently with functional limitations due to the deficits listed below (see PT Problem List). Pt will benefit from skilled PT to increase their independence and safety with mobility to allow discharge to the venue listed below.          Recommendations for follow up therapy are one component of a multi-disciplinary discharge planning process, led by the attending physician.  Recommendations may be updated based on patient status, additional functional criteria and insurance authorization.  Follow Up Recommendations Home health PT      Assistance Recommended at Discharge PRN  Patient can return home with the following  A little help with walking and/or transfers;Assistance with cooking/housework;Assist for transportation;Help with stairs or ramp for entrance    Equipment Recommendations None recommended by PT  Recommendations for Other Services       Functional Status Assessment Patient has had a recent decline in their functional status and demonstrates the ability to make significant improvements in function in a reasonable and predictable amount of time.     Precautions / Restrictions Precautions Precautions: Other (comment) Precaution Comments: NG tube, foley, Restrictions Weight Bearing Restrictions: No      Mobility  Bed Mobility Overal bed mobility:  Needs Assistance Bed Mobility: Rolling, Sidelying to Sit Rolling: Min guard Sidelying to sit: Min guard       General bed mobility comments: used bed rails    Transfers Overall transfer level: Needs assistance Equipment used: 1 person hand held assist Transfers: Sit to/from Stand, Bed to chair/wheelchair/BSC Sit to Stand: Min guard Stand pivot transfers: Min assist         General transfer comment: Needed min assist to steady with HHA to pivot bed to recliner.    Ambulation/Gait                  Stairs            Wheelchair Mobility    Modified Rankin (Stroke Patients Only)       Balance Overall balance assessment: No apparent balance deficits (not formally assessed)                                           Pertinent Vitals/Pain Pain Assessment Pain Assessment: Faces Faces Pain Scale: Hurts a little bit Pain Location: left ear, throat left side, abdomen Pain Descriptors / Indicators: Discomfort Pain Intervention(s): Limited activity within patient's tolerance, Monitored during session, Repositioned    Home Living Family/patient expects to be discharged to:: Private residence Living Arrangements: Alone Available Help at Discharge: Family;Neighbor;Available PRN/intermittently (reports son can (A) but works., he has the ability to work from home if needed) Type of Home: House Home Access: Stairs to enter   [employer] of Steps: 1   Home Layout: One level Home Equipment: Kasandra Knudsen -  single point;Rollator (4 wheels);Wheelchair - manual;Shower seat - built in;Grab bars - tub/shower Additional Comments: drives, very involved in church, helps take other elderly church members on trips    Prior Function Prior Level of Function : Independent/Modified Independent                     Hand Dominance   Dominant Hand: Right    Extremity/Trunk Assessment   Upper Extremity Assessment Upper Extremity Assessment: Defer  to OT evaluation    Lower Extremity Assessment Lower Extremity Assessment: Generalized weakness    Cervical / Trunk Assessment Cervical / Trunk Assessment: Other exceptions Cervical / Trunk Exceptions: abdominal incision at midline with dressing. pt reports feeling slight pull wiht upright posture  Communication   Communication: Other (comment);No difficulties (horase voice)  Cognition Arousal/Alertness: Awake/alert Behavior During Therapy: WFL for tasks assessed/performed Overall Cognitive Status: Within Functional Limits for tasks assessed                                          General Comments General comments (skin integrity, edema, etc.): VSS with sats 98% on RA    Exercises     Assessment/Plan    PT Assessment Patient needs continued PT services  PT Problem List Decreased activity tolerance;Decreased balance;Decreased mobility;Decreased knowledge of use of DME;Decreased safety awareness;Decreased knowledge of precautions       PT Treatment Interventions DME instruction;Gait training;Functional mobility training;Therapeutic activities;Therapeutic exercise;Balance training;Patient/family education    PT Goals (Current goals can be found in the Care Plan section)  Acute Rehab PT Goals Patient Stated Goal: to go home PT Goal Formulation: With patient Time For Goal Achievement: 06/18/22 Potential to Achieve Goals: Good    Frequency Min 3X/week     Co-evaluation               AM-PAC PT "6 Clicks" Mobility  Outcome Measure Help needed turning from your back to your side while in a flat bed without using bedrails?: A Little Help needed moving from lying on your back to sitting on the side of a flat bed without using bedrails?: A Little Help needed moving to and from a bed to a chair (including a wheelchair)?: A Little Help needed standing up from a chair using your arms (e.g., wheelchair or bedside chair)?: A Little Help needed to walk in  hospital room?: A Little Help needed climbing 3-5 steps with a railing? : A Little 6 Click Score: 18    End of Session Equipment Utilized During Treatment: Gait belt Activity Tolerance: Patient limited by fatigue (limited by NG tube to suction) Patient left: in chair;with call bell/phone within reach;with chair alarm set Nurse Communication: Mobility status PT Visit Diagnosis: Muscle weakness (generalized) (M62.81);Other abnormalities of gait and mobility (R26.89)    Time: 4627-0350 PT Time Calculation (min) (ACUTE ONLY): 28 min   Charges:   PT Evaluation $PT Eval Moderate Complexity: 1 Mod PT Treatments $Therapeutic Activity: 8-22 mins        [facility] M,PT Acute Rehab Services [phone]   Alvira Philips 06/04/2022, 2:09 PM
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
## 2022-06-04 NOTE — Progress Notes (Signed)
[facility] for Heparin Indication: atrial fibrillation  Allergies  Allergen Reactions   Corticosteroids Other (See Comments)    Liver scarring as a child   Covid-19 (Mrna) Vaccine Other (See Comments)    Postural Orthostatic Tachycardia Syndrome (POTS)   Other Other (See Comments)    Cannot take STEROIDS-unknown reaction-caused by internal scarring on liver.    Anesthesia precaution due to excessive liver scarring    Pacerone [Amiodarone] Other (See Comments)    Dizziness Visual hallucinations   Erythromycin Nausea And Vomiting and Rash   Zyloprim [Allopurinol] Rash and Other (See Comments)    Skin peeling More severe gout     Patient Measurements: Height: 5' 2.25" (158.1 cm) Weight: 63 kg (139 lb) IBW/kg (Calculated) : 50.68  Heparin Dosing Weight: 63 kg  Vital Signs: BP: 101/61 (08/25 1955) Pulse Rate: 98 (08/25 1955)  Labs: Recent Labs    06/02/22 0719 06/03/22 0225 06/04/22 0119 06/04/22 1355 06/04/22 1411 06/04/22 2302  HGB 9.9* 11.0* 9.7*  --   --  9.6*  HCT 29.0* 31.4* 28.3*  --   --  28.3*  PLT 167 188 161  --   --  200  APTT 57* 55*  --   --  44*  --   HEPARINUNFRC 0.79*  --   --  0.15*  --  0.11*  CREATININE  --  0.85 0.71  --   --   --      Estimated Creatinine Clearance: 50.1 mL/min (by C-G formula based on SCr of 0.71 mg/dL).    Assessment: 81 y.o. female with medical history significant of A.Fib on Xarelto prior to admission. Last dose reported 8/21 at 2100. Pharmacy consulted to manage heparin while Xarelto on hold.    8/25 PM update:  Heparin level low   Goal of Therapy:  Heparin level 0.3-0.7 units/ml Monitor platelets by anticoagulation protocol: Yes   Plan:  Increase heparin infusion to 1200 units/hr Check heparin level in 8 hours Continue to monitor H&H and platelets   Narda Bonds, PharmD, BCPS Clinical Pharmacist Phone: [phone]

## 2022-06-04 NOTE — Progress Notes (Signed)
PROGRESS NOTE    Yolanda Bell  PPI:[id number] DOB: 06-14-1941 DOA: 05/31/2022 PCP: Faustino Congress, NP  Chief Complaint  Patient presents with   Abdominal Pain    Brief Narrative:  81 yo with hx atrial fibrillation on xarelto, HTN, recent covid 19 infection who presented with diffuse abdominal pain, nausea, and vomiting as well as urinary urgency/hesitation.  See below for additional details    Assessment & Plan:   Principal Problem:   Diverticulitis of small bowel Active Problems:   Diverticulitis large intestine   Urinary urgency   Hypertension   PAF (paroxysmal atrial fibrillation) (HCC)   Nodule of kidney   Compression fracture of lumbar vertebra (HCC)   History of COVID-19   History of liver disease   Diverticulitis   Assessment and Plan: * Diverticulitis of small bowel Giant small bowel diverticulum, (apparently of jejunum) with acute diverticulitis.  Diverticulum measures 5.3 x 7.3 x 6.2 cm up from 2.5cm in 2022. Also mild diverticulitis of sigmoid. Inflammation about bladder likely causing sensation of urinary urgency. NPO IVF Zosyn Morphine PRN pain Switching Xarelto to heparin gtt Appreciate general surgery recs ->now s/p small bowel resection and ex lap on 8/24 -> npo until return in bowel function, wound care per general surgery  Diverticulitis large intestine abx Appreciate general surgery recs  Urinary urgency Inflammation about bladder from diverticulitis likely to be causing todays sensation of urinary urgency / retention (as noted on CT scan).  Pt doesn't appear to actually be retaining from bladder scans. UA not c/w UTI    Hypertension Cont BB Hold diuretics.  PAF (paroxysmal atrial fibrillation) (HCC) Cont BB. Hold Xarelto, Heparin per pharm to resume 8/25 AM On scheduled IV metoprolol for now Seen by cardiology, note can hold anticoagulation as needed, continue beta blocker, high risk for post surgical atrial fib  Nodule of  kidney Concerning for renal cell carcinoma Needs additional imaging   Compression fracture of lumbar vertebra (HCC) Compression Fx of L5, while severe, is old, stable on imaging and chronic (was chronic even on 06/2021 CT).  History of COVID-19 August 9th symptoms started, tested positive 8/10 8/17 she reports home test was negative Has some residual L ear pain, otherwise sx improved  History of liver disease Unclear hx surrounding this Related to steroids/volume? unclear Would recommend follow up outpatient     DVT prophylaxis: heparin gtt Code Status: full Family Communication: none Disposition:   Status is: Observation The patient will require care spanning > 2 midnights and should be moved to inpatient because: need for abx/surgery   Consultants:  Surgery cardiology  Procedures:  none  Antimicrobials:  Anti-infectives (From admission, onward)    Start     Dose/Rate Route Frequency Ordered Stop   06/03/22 1400  piperacillin-tazobactam (ZOSYN) IVPB 3.375 g        3.375 g 12.5 mL/hr over 240 Minutes Intravenous Every 8 hours 06/03/22 1346 06/08/22 1359   06/01/22 1200  piperacillin-tazobactam (ZOSYN) IVPB 3.375 g  Status:  Discontinued        3.375 g 12.5 mL/hr over 240 Minutes Intravenous Every 8 hours 06/01/22 0335 06/03/22 1346   06/01/22 0315  piperacillin-tazobactam (ZOSYN) IVPB 3.375 g        3.375 g 100 mL/hr over 30 Minutes Intravenous  Once 06/01/22 0305 06/01/22 0408       Subjective: Asking for foley to be removed HR on fast side (but notably sinus)  Objective: Vitals:   06/03/22 2333 06/04/22 0024 06/04/22 0328 06/04/22  0854  BP: 123/65 (!) 113/58 (!) 113/57 124/63  Pulse: 96 85 84 91  Resp: '18 19 17 18  '$ Temp:  97.8 F (36.6 C) 98 F (36.7 C) 98.2 F (36.8 C)  TempSrc:  Oral Oral Oral  SpO2: 98% 99% 99% 98%  Weight:      Height:        Intake/Output Summary (Last 24 hours) at 06/04/2022 1835 Last data filed at 06/04/2022 1300 Gross  per 24 hour  Intake 0 ml  Output 1250 ml  Net -1250 ml   Filed Weights   05/31/22 2332 06/03/22 0939  Weight: 63 kg 63 kg    Examination:  General: No acute distress. Cardiovascular: RRR Lungs: unlabored Abdomen: dressing in place, appropriately ttp post op, NG in place Neurological: Alert and oriented 3. Moves all extremities 4 with equal strength. Cranial nerves II through XII grossly intact. Extremities: No clubbing or cyanosis. No edema.  Data Reviewed: I have personally reviewed following labs and imaging studies  CBC: Recent Labs  Lab 05/31/22 2344 06/01/22 0424 06/02/22 0719 06/03/22 0225 06/04/22 0119  WBC 19.8* 20.1* 14.2* 14.6* 9.2  NEUTROABS  --   --   --  13.3* 8.2*  HGB 13.0 11.9* 9.9* 11.0* 9.7*  HCT 38.7 34.0* 29.0* 31.4* 28.3*  MCV 90.2 88.1 89.8 89.0 89.3  PLT 301 212 167 188 993    Basic Metabolic Panel: Recent Labs  Lab 05/31/22 2344 06/01/22 0424 06/03/22 0225 06/04/22 0119  NA 135 136 135 137  K 3.5 3.9 3.4* 3.5  CL 100 103 102 106  CO2 '24 23 24 22  '$ GLUCOSE 120* 118* 104* 97  BUN '8 8 10 10  '$ CREATININE 0.81 0.75 0.85 0.71  CALCIUM 9.0 8.5* 8.3* 7.4*  MG  --   --  1.9 1.8  PHOS  --   --  2.0* 3.0    GFR: Estimated Creatinine Clearance: 50.1 mL/min (by C-G formula based on SCr of 0.71 mg/dL).  Liver Function Tests: Recent Labs  Lab 05/31/22 2344 06/03/22 0225 06/04/22 0119  AST '18 22 19  '$ ALT '12 17 14  '$ ALKPHOS 53 60 47  BILITOT 0.5 2.3* 1.4*  PROT 6.4* 6.1* 4.6*  ALBUMIN 3.8 3.0* 2.0*    CBG: No results for input(s): "GLUCAP" in the last 168 hours.   Recent Results (from the past 240 hour(s))  Surgical PCR screen     Status: None   Collection Time: 06/03/22  2:52 AM   Specimen: Nasal Mucosa; Nasal Swab  Result Value Ref Range Status   MRSA, PCR NEGATIVE NEGATIVE Final   Staphylococcus aureus NEGATIVE NEGATIVE Final    Comment: (NOTE) The Xpert SA Assay (FDA approved for NASAL specimens in patients 50 years of age  and older), is one component of [facility] comprehensive surveillance program. It is not intended to diagnose infection nor to guide or monitor treatment. Performed at [facility], [place] [address] [place], [place] [address]          Radiology Studies: No results found.      Scheduled Meds:  metoprolol tartrate  5 mg Intravenous Q6H   Continuous Infusions:  acetaminophen 1,000 mg (06/04/22 1426)   heparin 1,050 Units/hr (06/04/22 1529)   lactated ringers 75 mL/hr at 06/03/22 1707   methocarbamol (ROBAXIN) IV     piperacillin-tazobactam (ZOSYN)  IV 3.375 g (06/04/22 1507)     LOS: 2 days    Time spent: over 30 min    Marcelline Deist  Florene Glen, MD [facility]   To contact the attending provider between 7A-7P or the covering provider during after hours 7P-7A, please log into the web site [email or web address] and access using universal [place] password for that web site. If you do not have the password, please call the hospital operator.  06/04/2022, 6:35 PM

## 2022-06-04 NOTE — Progress Notes (Signed)
[facility] Progress Note  1 Day Post-Op  Subjective: CC-  Up in chair. Abdomen feeling better than prior to surgery. Denies n/v. No flatus or BM.  Objective: Vital signs in last 24 hours: Temp:  [97.7 F (36.5 C)-98.8 F (37.1 C)] 98.2 F (36.8 C) (08/25 0854) Pulse Rate:  [78-101] 91 (08/25 0854) Resp:  [16-22] 18 (08/25 0854) BP: (100-139)/(56-65) 124/63 (08/25 0854) SpO2:  [91 %-100 %] 98 % (08/25 0854) Last BM Date : 06/03/22  Intake/Output from previous day: 08/24 0701 - 08/25 0700 In: 2661.1 [I.V.:2485.6; IV Piggyback:175.5] Out: 900 [Urine:750; Emesis/NG output:100; Blood:50] Intake/Output this shift: Total I/O In: -  Out: 450 [Urine:450]  PE: Gen:  Alert, NAD Pulm: rate and effort normal Abd: Soft, minimal distension, hypoactive BS, open midline incision with healthy granulation tissue and no cellulitis or purulent drainage/ no bleeding  Lab Results:  Recent Labs    06/03/22 0225 06/04/22 0119  WBC 14.6* 9.2  HGB 11.0* 9.7*  HCT 31.4* 28.3*  PLT 188 161   BMET Recent Labs    06/03/22 0225 06/04/22 0119  NA 135 137  K 3.4* 3.5  CL 102 106  CO2 24 22  GLUCOSE 104* 97  BUN 10 10  CREATININE 0.85 0.71  CALCIUM 8.3* 7.4*   PT/INR No results for input(s): "LABPROT", "INR" in the last 72 hours. CMP     Component Value Date/Time   NA 137 06/04/2022 0119   NA 138 04/06/2022 1429   K 3.5 06/04/2022 0119   CL 106 06/04/2022 0119   CO2 22 06/04/2022 0119   GLUCOSE 97 06/04/2022 0119   BUN 10 06/04/2022 0119   BUN 10 04/06/2022 1429   CREATININE 0.71 06/04/2022 0119   CREATININE 0.90 08/12/2016 1022   CALCIUM 7.4 (L) 06/04/2022 0119   PROT 4.6 (L) 06/04/2022 0119   PROT 6.9 11/21/2019 1035   ALBUMIN 2.0 (L) 06/04/2022 0119   ALBUMIN 4.5 11/21/2019 1035   AST 19 06/04/2022 0119   ALT 14 06/04/2022 0119   ALKPHOS 47 06/04/2022 0119   BILITOT 1.4 (H) 06/04/2022 0119   BILITOT 0.9 11/21/2019 1035   GFRNONAA >60 06/04/2022 0119    GFRAA 66 11/21/2019 1035   Lipase     Component Value Date/Time   LIPASE 44 05/31/2022 2344       Studies/Results: No results found.  Anti-infectives: Anti-infectives (From admission, onward)    Start     Dose/Rate Route Frequency Ordered Stop   06/03/22 1400  piperacillin-tazobactam (ZOSYN) IVPB 3.375 g        3.375 g 12.5 mL/hr over 240 Minutes Intravenous Every 8 hours 06/03/22 1346 06/08/22 1359   06/01/22 1200  piperacillin-tazobactam (ZOSYN) IVPB 3.375 g  Status:  Discontinued        3.375 g 12.5 mL/hr over 240 Minutes Intravenous Every 8 hours 06/01/22 0335 06/03/22 1346   06/01/22 0315  piperacillin-tazobactam (ZOSYN) IVPB 3.375 g        3.375 g 100 mL/hr over 30 Minutes Intravenous  Once 06/01/22 0305 06/01/22 0408        Assessment/Plan JEJUNAL DIVERTICULITIS with necrosis  -POD#1 EXPLORATORY LAPAROTOMY, SMALL BOWEL RESECTION 8/24 Dr. Redmond Pulling - Continue NPO/NGT to [facility] and await return in bowel function - Mobilize, PT/OT to see - continue IV tylenol for pain control - BID wet to dry dressing changes - heparin gtt restarted this morning, monitor for bleeding  ID - zosyn 8/22>>8/29 (5 days postop) FEN - IVF, NPO/NGT to LIWS VTE -  heparin gtt Foley - d/c 8/25  Paroxysmal A Fib - xarelto on hold, restart heparin gtt this morning. Cards has seen, high risk for A fib postop and they have given medication recommendations History of constrictive pericarditis s/p pericardial stripping in 2006 HTN Recent Covid-19 infection (diagnosed 05/19/22)    LOS: 2 days    [name], [facility] 06/04/2022, 10:50 AM Please see Amion for pager number during day hours 7:00am-4:30pm

## 2022-06-04 NOTE — Progress Notes (Signed)
Patient has three iv medications due around the same time, Heparin drip to start, Zosyn IV and Acetaminophen IV.    I tried to hang Acetaminophen IV early because the other two will take longer to infuse however a red alert came up and would not allow me to hang the Acetaminophen IV early.    Patient denies any pain at this time.  Will pass this along for staff to follow up since this nurse is leaving at 0600 today.

## 2022-06-05 DIAGNOSIS — K5712 Diverticulitis of small intestine without perforation or abscess without bleeding: Secondary | ICD-10-CM | POA: Diagnosis not present

## 2022-06-05 LAB — CBC
HCT: 31.4 % — ABNORMAL LOW (ref 36.0–46.0)
Hemoglobin: 10.6 g/dL — ABNORMAL LOW (ref 12.0–15.0)
MCH: 30.5 pg (ref 26.0–34.0)
MCHC: 33.8 g/dL (ref 30.0–36.0)
MCV: 90.2 fL (ref 80.0–100.0)
Platelets: 262 10*3/uL (ref 150–400)
RBC: 3.48 MIL/uL — ABNORMAL LOW (ref 3.87–5.11)
RDW: 13.6 % (ref 11.5–15.5)
WBC: 9 10*3/uL (ref 4.0–10.5)
nRBC: 0 % (ref 0.0–0.2)

## 2022-06-05 LAB — HEPARIN LEVEL (UNFRACTIONATED)
Heparin Unfractionated: 0.23 IU/mL — ABNORMAL LOW (ref 0.30–0.70)
Heparin Unfractionated: 0.19 IU/mL — ABNORMAL LOW (ref 0.30–0.70)

## 2022-06-05 MED ORDER — ACETAMINOPHEN 10 MG/ML IV SOLN
1000.0000 mg | Freq: Four times a day (QID) | INTRAVENOUS | Status: AC
Start: 2022-06-05 — End: 2022-06-06
  Administered 2022-06-05 – 2022-06-06 (×4): 1000 mg via INTRAVENOUS
  Filled 2022-06-05 (×4): qty 100

## 2022-06-05 MED ORDER — MENTHOL 3 MG MT LOZG
1.0000 | LOZENGE | OROMUCOSAL | Status: DC | PRN
  Filled 2022-06-05: qty 9

## 2022-06-05 NOTE — Progress Notes (Signed)
[facility] Progress Note  2 Days Post-Op  Subjective: CC-  Abdomen a little more sore today than yesterday. No flatus or BM. NG with 800cc bilious output. She did get OOB to chair yesterday and work with therapies.  Objective: Vital signs in last 24 hours: Temp:  [98.4 F (36.9 C)-98.6 F (37 C)] 98.4 F (36.9 C) (08/26 0821) Pulse Rate:  [83-98] 83 (08/26 0821) Resp:  [14-20] 20 (08/26 0821) BP: (101-119)/(56-61) 119/56 (08/26 0821) SpO2:  [97 %-99 %] 99 % (08/26 0821) Last BM Date : 06/02/22  Intake/Output from previous day: 08/25 0701 - 08/26 0700 In: 1369.5 [P.O.:60; I.V.:890; IV Piggyback:419.5] Out: 1900 [Urine:1100; Emesis/NG output:800] Intake/Output this shift: No intake/output data recorded.  PE: Gen:  Alert, NAD Pulm: rate and effort normal Abd: Soft, minimal distension, hypoactive BS, open midline incision with healthy granulation tissue and no cellulitis or purulent drainage/ no bleeding  Lab Results:  Recent Labs    06/04/22 2302 06/05/22 0813  WBC 7.2 9.0  HGB 9.6* 10.6*  HCT 28.3* 31.4*  PLT 200 262   BMET Recent Labs    06/04/22 0119 06/04/22 2302  NA 137 139  K 3.5 3.6  CL 106 106  CO2 22 26  GLUCOSE 97 89  BUN 10 10  CREATININE 0.71 0.73  CALCIUM 7.4* 7.5*   PT/INR No results for input(s): "LABPROT", "INR" in the last 72 hours. CMP     Component Value Date/Time   NA 139 06/04/2022 2302   NA 138 04/06/2022 1429   K 3.6 06/04/2022 2302   CL 106 06/04/2022 2302   CO2 26 06/04/2022 2302   GLUCOSE 89 06/04/2022 2302   BUN 10 06/04/2022 2302   BUN 10 04/06/2022 1429   CREATININE 0.73 06/04/2022 2302   CREATININE 0.90 08/12/2016 1022   CALCIUM 7.5 (L) 06/04/2022 2302   PROT 4.6 (L) 06/04/2022 0119   PROT 6.9 11/21/2019 1035   ALBUMIN 2.0 (L) 06/04/2022 0119   ALBUMIN 4.5 11/21/2019 1035   AST 19 06/04/2022 0119   ALT 14 06/04/2022 0119   ALKPHOS 47 06/04/2022 0119   BILITOT 1.4 (H) 06/04/2022 0119   BILITOT 0.9  11/21/2019 1035   GFRNONAA >60 06/04/2022 2302   GFRAA 66 11/21/2019 1035   Lipase     Component Value Date/Time   LIPASE 44 05/31/2022 2344       Studies/Results: No results found.  Anti-infectives: Anti-infectives (From admission, onward)    Start     Dose/Rate Route Frequency Ordered Stop   06/03/22 1400  piperacillin-tazobactam (ZOSYN) IVPB 3.375 g        3.375 g 12.5 mL/hr over 240 Minutes Intravenous Every 8 hours 06/03/22 1346 06/08/22 1359   06/01/22 1200  piperacillin-tazobactam (ZOSYN) IVPB 3.375 g  Status:  Discontinued        3.375 g 12.5 mL/hr over 240 Minutes Intravenous Every 8 hours 06/01/22 0335 06/03/22 1346   06/01/22 0315  piperacillin-tazobactam (ZOSYN) IVPB 3.375 g        3.375 g 100 mL/hr over 30 Minutes Intravenous  Once 06/01/22 0305 06/01/22 0408        Assessment/Plan JEJUNAL DIVERTICULITIS with necrosis  -POD#2 EXPLORATORY LAPAROTOMY, SMALL BOWEL RESECTION 8/24 Dr. Redmond Pulling - Continue NPO/NGT to [facility] and await return in bowel function - Mobilize, PT/OT - recommending HH PT - continue IV tylenol for pain control - BID wet to dry dressing changes - Hgb stable today 10.6<<9.6 after restarting heparin gtt 8/25, monitor for bleeding  ID - zosyn 8/22>>8/29 (5 days postop) FEN - IVF, NGT to LIWS, NPO except ice chips and a popsicle VTE - heparin gtt Foley - d/c 8/25   Paroxysmal A Fib - xarelto on hold, restart heparin gtt this morning. Cards has seen, high risk for A fib postop and they have given medication recommendations History of constrictive pericarditis s/p pericardial stripping in 2006 HTN Recent Covid-19 infection (diagnosed 05/19/22)    LOS: 3 days    [name], [facility] 06/05/2022, 9:43 AM Please see Amion for pager number during day hours 7:00am-4:30pm

## 2022-06-05 NOTE — Progress Notes (Signed)
PROGRESS NOTE    Yolanda Bell  YNW:[id number] DOB: 1941/06/24 DOA: 05/31/2022 PCP: Faustino Congress, NP  Chief Complaint  Patient presents with   Abdominal Pain    Brief Narrative:  81 yo with hx atrial fibrillation on xarelto, HTN, recent covid 19 infection who presented with diffuse abdominal pain, nausea, and vomiting as well as urinary urgency/hesitation.  See below for additional details    Assessment & Plan:   Principal Problem:   Diverticulitis of small bowel Active Problems:   Diverticulitis large intestine   Urinary urgency   Hypertension   PAF (paroxysmal atrial fibrillation) (HCC)   Nodule of kidney   Compression fracture of lumbar vertebra (HCC)   History of COVID-19   History of liver disease   Diverticulitis   Assessment and Plan: * Diverticulitis of small bowel Giant small bowel diverticulum, (apparently of jejunum) with acute diverticulitis.  Diverticulum measures 5.3 x 7.3 x 6.2 cm up from 2.5cm in 2022. Also mild diverticulitis of sigmoid. Inflammation about bladder likely causing sensation of urinary urgency. NPO IVF Zosyn Morphine PRN pain Switching Xarelto to heparin gtt Appreciate general surgery recs ->now s/p small bowel resection and ex lap on 8/24 -> npo until return in bowel function, wound care per general surgery  Diverticulitis large intestine abx Appreciate general surgery recs  Urinary urgency Inflammation about bladder from diverticulitis likely to be causing todays sensation of urinary urgency / retention (as noted on CT scan).  Pt doesn't appear to actually be retaining from bladder scans. UA not c/w UTI    Hypertension Cont BB Hold diuretics.  PAF (paroxysmal atrial fibrillation) (HCC) Cont BB. Hold Xarelto, Heparin per pharm to resume 8/25 AM On scheduled IV metoprolol for now Seen by cardiology, note can hold anticoagulation as needed, continue beta blocker, high risk for post surgical atrial fib  Nodule of  kidney Concerning for renal cell carcinoma Needs additional imaging   Compression fracture of lumbar vertebra (HCC) Compression Fx of L5, while severe, is old, stable on imaging and chronic (was chronic even on 06/2021 CT).  History of COVID-19 August 9th symptoms started, tested positive 8/10 8/17 she reports home test was negative Has some residual L ear pain, otherwise sx improved  History of liver disease Unclear hx surrounding this Related to steroids/volume? unclear Would recommend follow up outpatient     DVT prophylaxis: heparin gtt Code Status: full Family Communication: none - son at bedside Disposition:   Status is: Observation The patient will require care spanning > 2 midnights and should be moved to inpatient because: need for abx/surgery   Consultants:  Surgery cardiology  Procedures:  none  Antimicrobials:  Anti-infectives (From admission, onward)    Start     Dose/Rate Route Frequency Ordered Stop   06/03/22 1400  piperacillin-tazobactam (ZOSYN) IVPB 3.375 g        3.375 g 12.5 mL/hr over 240 Minutes Intravenous Every 8 hours 06/03/22 1346 06/08/22 1359   06/01/22 1200  piperacillin-tazobactam (ZOSYN) IVPB 3.375 g  Status:  Discontinued        3.375 g 12.5 mL/hr over 240 Minutes Intravenous Every 8 hours 06/01/22 0335 06/03/22 1346   06/01/22 0315  piperacillin-tazobactam (ZOSYN) IVPB 3.375 g        3.375 g 100 mL/hr over 30 Minutes Intravenous  Once 06/01/22 0305 06/01/22 0408       Subjective: Feels better than she would have expected today No flatus   Objective: Vitals:   06/04/22 0854 06/04/22 1955 06/05/22  7939 06/05/22 1631  BP: 124/63 101/61 (!) 119/56 (!) 129/57  Pulse: 91 98 83 96  Resp: '18 14 20 18  '$ Temp: 98.2 F (36.8 C) 98.6 F (37 C) 98.4 F (36.9 C) 97.8 F (36.6 C)  TempSrc: Oral Oral Oral   SpO2: 98% 97% 99% 95%  Weight:      Height:        Intake/Output Summary (Last 24 hours) at 06/05/2022 1910 Last data filed  at 06/05/2022 1300 Gross per 24 hour  Intake 1369.5 ml  Output 1750 ml  Net -380.5 ml   Filed Weights   05/31/22 2332 06/03/22 0939  Weight: 63 kg 63 kg    Examination:  General: No acute distress. Cardiovascular: rRR Lungs: unlabored Abdomen: quiet bowel sounds, midline dressing Neurological: Alert and oriented 3. Moves all extremities 4 with equal strength. Cranial nerves II through XII grossly intact. Extremities: No clubbing or cyanosis. No edema.  Data Reviewed: I have personally reviewed following labs and imaging studies  CBC: Recent Labs  Lab 06/02/22 0719 06/03/22 0225 06/04/22 0119 06/04/22 2302 06/05/22 0813  WBC 14.2* 14.6* 9.2 7.2 9.0  NEUTROABS  --  13.3* 8.2*  --   --   HGB 9.9* 11.0* 9.7* 9.6* 10.6*  HCT 29.0* 31.4* 28.3* 28.3* 31.4*  MCV 89.8 89.0 89.3 89.0 90.2  PLT 167 188 161 200 030    Basic Metabolic Panel: Recent Labs  Lab 05/31/22 2344 06/01/22 0424 06/03/22 0225 06/04/22 0119 06/04/22 2302  NA 135 136 135 137 139  K 3.5 3.9 3.4* 3.5 3.6  CL 100 103 102 106 106  CO2 '24 23 24 22 26  '$ GLUCOSE 120* 118* 104* 97 89  BUN '8 8 10 10 10  '$ CREATININE 0.81 0.75 0.85 0.71 0.73  CALCIUM 9.0 8.5* 8.3* 7.4* 7.5*  MG  --   --  1.9 1.8  --   PHOS  --   --  2.0* 3.0  --     GFR: Estimated Creatinine Clearance: 50.1 mL/min (by C-G formula based on SCr of 0.73 mg/dL).  Liver Function Tests: Recent Labs  Lab 05/31/22 2344 06/03/22 0225 06/04/22 0119  AST '18 22 19  '$ ALT '12 17 14  '$ ALKPHOS 53 60 47  BILITOT 0.5 2.3* 1.4*  PROT 6.4* 6.1* 4.6*  ALBUMIN 3.8 3.0* 2.0*    CBG: No results for input(s): "GLUCAP" in the last 168 hours.   Recent Results (from the past 240 hour(s))  Surgical PCR screen     Status: None   Collection Time: 06/03/22  2:52 AM   Specimen: Nasal Mucosa; Nasal Swab  Result Value Ref Range Status   MRSA, PCR NEGATIVE NEGATIVE Final   Staphylococcus aureus NEGATIVE NEGATIVE Final    Comment: (NOTE) The Xpert SA Assay  (FDA approved for NASAL specimens in patients 32 years of age and older), is one component of [facility] comprehensive surveillance program. It is not intended to diagnose infection nor to guide or monitor treatment. Performed at [facility], [address], [place], [place] [address]          Radiology Studies: No results found.      Scheduled Meds:  metoprolol tartrate  5 mg Intravenous Q6H   Continuous Infusions:  acetaminophen 1,000 mg (06/05/22 1147)   heparin 1,350 Units/hr (06/05/22 0944)   lactated ringers 75 mL/hr at 06/03/22 1707   methocarbamol (ROBAXIN) IV     piperacillin-tazobactam (ZOSYN)  IV 3.375 g (06/05/22 1446)  LOS: 3 days    Time spent: over 30 min    Fayrene Helper, MD [facility]   To contact the attending provider between 7A-7P or the covering provider during after hours 7P-7A, please log into the web site [email or web address] and access using universal [place] password for that web site. If you do not have the password, please call the hospital operator.  06/05/2022, 7:10 PM

## 2022-06-05 NOTE — Progress Notes (Signed)
[place] for Heparin Indication: atrial fibrillation  Allergies  Allergen Reactions   Corticosteroids Other (See Comments)    Liver scarring as a child   Covid-19 (Mrna) Vaccine Other (See Comments)    Postural Orthostatic Tachycardia Syndrome (POTS)   Other Other (See Comments)    Cannot take STEROIDS-unknown reaction-caused by internal scarring on liver.    Anesthesia precaution due to excessive liver scarring    Pacerone [Amiodarone] Other (See Comments)    Dizziness Visual hallucinations   Erythromycin Nausea And Vomiting and Rash   Zyloprim [Allopurinol] Rash and Other (See Comments)    Skin peeling More severe gout     Patient Measurements: Height: 5' 2.25" (158.1 cm) Weight: 63 kg (139 lb) IBW/kg (Calculated) : 50.68  Heparin Dosing Weight: 63 kg  Vital Signs: Temp: 97.8 F (36.6 C) (08/26 1631) Temp Source: Oral (08/26 0821) BP: 129/57 (08/26 1631) Pulse Rate: 96 (08/26 1631)  Labs: Recent Labs    06/03/22 0225 06/04/22 0119 06/04/22 1355 06/04/22 1411 06/04/22 2302 06/05/22 0813 06/05/22 1823  HGB 11.0* 9.7*  --   --  9.6* 10.6*  --   HCT 31.4* 28.3*  --   --  28.3* 31.4*  --   PLT 188 161  --   --  200 262  --   APTT 55*  --   --  44*  --   --   --   HEPARINUNFRC  --   --    < >  --  0.11* 0.23* 0.19*  CREATININE 0.85 0.71  --   --  0.73  --   --    < > = values in this interval not displayed.     Estimated Creatinine Clearance: 50.1 mL/min (by C-G formula based on SCr of 0.73 mg/dL).    Assessment: 81 y.o. female with medical history significant of A.Fib on Xarelto prior to admission. Last dose reported 8/21 at 2100. Pharmacy consulted to manage heparin while Xarelto on hold.   Heparin infusion restarted 8/25 AM. Heparin level subtherapeutic at 0.19 despite rate adjustment.    Goal of Therapy:  Heparin level 0.3-0.7 units/ml Monitor platelets by anticoagulation protocol: Yes   Plan:  Increase  heparin infusion to 1550 units/h Recheck heparin level with am labs  Arrie Senate, PharmD, BCPS, [facility] Clinical Pharmacist [phone] Please check AMION for all [place] numbers 06/05/2022

## 2022-06-05 NOTE — Plan of Care (Signed)

## 2022-06-05 NOTE — TOC Progression Note (Signed)
Transition of Care [facility]) - Progression Note    Patient Details  Name: Yolanda Bell MRN: [id number] Date of Birth: 10/21/1940  [facility]) CM/SW Contact  Bartholomew Crews, RN Phone Number: [phone] 06/05/2022, 5:15 PM  Clinical Narrative:     [facility] referral accepted by [facility]. Patient will need HH PT/Aide with Face to Face orders. TOC following for transition needs.   Expected Discharge Plan: [employer] Barriers to Discharge: Continued Medical Work up  Expected Discharge Plan and Services Expected Discharge Plan: [place]   Discharge Planning Services: CM Consult Post Acute Care Choice: [place] arrangements for the past 2 months: Single Family Home                           HH Arranged: PT, Nurse's Aide HH Agency: [place] Date [facility] Agency Contacted: 06/05/22 Time [place]: [id number] Representative spoke with at [place]: [place] ([place]) Interventions    Readmission Risk Interventions     No data to display

## 2022-06-05 NOTE — Progress Notes (Signed)
Occupational Therapy Treatment Patient Details Name: Yolanda Bell MRN: [id number] DOB: [birth year] Today's Date: 06/05/2022   History of present illness 81 yo admitted who presented with diffuse abdominal pain, nausea, and vomiting as well as urinary urgency/hesitation.  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 PMH:  atrial fibrillation on xarelto, HTN, recent covid 19 infection   OT comments  Pt progressing toward established OT goals. Pt currently requires supervision for bed mobility and minguard assistance for functional mobility without AD. Pt noted to have 1x minor R knee buckling, pt able to self correct. Pt reports this is baseline and has not caused her to fall historically. Pt provided with energy conservation strategy handout. Will continue to follow acutely and progress appropriately.    Recommendations for follow up therapy are one component of a multi-disciplinary discharge planning process, led by the attending physician.  Recommendations may be updated based on patient status, additional functional criteria and insurance authorization.    Follow Up Recommendations  No OT follow up    Assistance Recommended at Discharge None  Patient can return home with the following  Assist for transportation   Equipment Recommendations  BSC/3in1    Recommendations for Other Services      Precautions / Restrictions Precautions Precautions: Other (comment) Precaution Comments: NG tube Restrictions Weight Bearing Restrictions: No       Mobility Bed Mobility Overal bed mobility: Needs Assistance Bed Mobility: Rolling, Sidelying to Sit Rolling: Supervision Sidelying to sit: Supervision       General bed mobility comments: used bed rails    Transfers Overall transfer level: Needs assistance Equipment used: 1 person hand held assist Transfers: Sit to/from Stand Sit to Stand: Supervision           General transfer comment:  supervision for safety;minguard with ambulation for line management and 1x knee with minor buckle, pt able to self correct     Balance Overall balance assessment: No apparent balance deficits (not formally assessed)                                         ADL either performed or assessed with clinical judgement   ADL Overall ADL's : Needs assistance/impaired Eating/Feeding: NPO Eating/Feeding Details (indicate cue type and reason): ng with wall suction;able to eat ice chips Grooming: Modified independent Grooming Details (indicate cue type and reason): wiping nose with tissues             Lower Body Dressing: Set up Lower Body Dressing Details (indicate cue type and reason): able to figure 4 cross. pt taking sock on and off during session. Toilet Transfer: Magazine features editor Details (indicate cue type and reason): pt with min guard due to NG tube and lines.         Functional mobility during ADLs: Min guard General ADL Comments: minor instability noted, educated pt on fall prevention strategies for safe engagement in ADL and functional mobility    Extremity/Trunk Assessment Upper Extremity Assessment Upper Extremity Assessment: Overall WFL for tasks assessed (pt reports hx of RUE tremor at baseline, limits ability to eat ice chips with spoon and get past NG tube with coordination. Pt has demonstrated appropriate compensatory strategies)   Lower Extremity Assessment Lower Extremity Assessment: Generalized weakness;RLE deficits/detail RLE Deficits / Details: 1x minor buckle in R knee, pt reports this is baseline, has not caused  her to fall historically.        Vision   Vision Assessment?: No apparent visual deficits   Perception     Praxis      Cognition Arousal/Alertness: Awake/alert Behavior During Therapy: WFL for tasks assessed/performed Overall Cognitive Status: Within Functional Limits for tasks assessed                                           Exercises      Shoulder Instructions       General Comments VSS HR briefly 121bpm with ambulation. SpO2 98% on RA at rest. HR 80s at rest    Pertinent Vitals/ Pain       Pain Assessment Pain Assessment: Faces Faces Pain Scale: Hurts a little bit Pain Location: left ear, throat left side, abdomen Pain Descriptors / Indicators: Discomfort Pain Intervention(s): Limited activity within patient's tolerance, Monitored during session  Home Living                                          Prior Functioning/Environment              Frequency  Min 2X/week        Progress Toward Goals  OT Goals(current goals can now be found in the care plan section)  Progress towards OT goals: Progressing toward goals  Acute Rehab OT Goals Patient Stated Goal: to go home OT Goal Formulation: With patient Time For Goal Achievement: 06/18/22 Potential to Achieve Goals: Good ADL Goals Pt Will Perform Tub/Shower Transfer: with modified independence;ambulating Additional ADL Goal #1: pt will demonstrate 2 energy conservation strategies with adls.  Plan Discharge plan remains appropriate    Co-evaluation                 AM-PAC OT "6 Clicks" Daily Activity     Outcome Measure   Help from another person eating meals?: None Help from another person taking care of personal grooming?: None Help from another person toileting, which includes using toliet, bedpan, or urinal?: A Little Help from another person bathing (including washing, rinsing, drying)?: A Little Help from another person to put on and taking off regular upper body clothing?: None Help from another person to put on and taking off regular lower body clothing?: None 6 Click Score: 22    End of Session    OT Visit Diagnosis: Unsteadiness on feet (R26.81)   Activity Tolerance Patient tolerated treatment well   Patient Left in chair;with chair alarm set;with call bell/phone  within reach   Nurse Communication Mobility status;Precautions        Time: 5784-6962 OT Time Calculation (min): 29 min  Charges: OT General Charges $OT Visit: 1 Visit OT Treatments $Self Care/Home Management : 23-37 mins  Helene Kelp OTR/L Acute Rehabilitation Services Office: [place] 06/05/2022, 3:22 PM

## 2022-06-05 NOTE — TOC Initial Note (Signed)
Transition of Care [facility]) - Initial/Assessment Note    Patient Details  Name: Yolanda Bell MRN: [id number] Date of Birth: 28-Dec-1940  [facility]) CM/SW Contact:    Bartholomew Crews, RN Phone Number: [phone] 06/05/2022, 4:35 PM  Clinical Narrative:                  Spoke with patient on her cell phone to discuss post acute transition. Patient agreeable to [facility] PT, but stated that she did not feel that she needed OT. Advised that only PT recommended HH. Patient requested assistance with her bath stating she needed a couple visits following a previous hospitalization in order to get washed. Offered agency choice. Patient had previously used [place] and requeted same agency. Referral pending with [facility]. Patient will need HH Face to Face orders. Patient stated that she will have transportation home at discharge. TOC following for transition needs.   Expected Discharge Plan: [place] Barriers to Discharge: Continued Medical Work up   Patient Goals and CMS Choice Patient states their goals for this hospitalization and ongoing recovery are:: return home CMS Medicare.gov Compare Post Acute Care list provided to:: Patient Choice offered to / list presented to : Patient  Expected Discharge Plan and Services Expected Discharge Plan: [place]   Discharge Planning Services: CM Consult Post Acute Care Choice: [place] arrangements for the past 2 months: [place] Arranged: PT, Nurse's Aide HH Agency: [place] Date [facility] Agency Contacted: 06/05/22 Time [place]: [phone] Representative spoke with at [place]: Alvis Lemmings  Prior Living Arrangements/Services Living arrangements for the past 2 months: [place] Lives with:: Self Patient language and need for interpreter reviewed:: Yes Do you feel safe going back to the place where you live?: Yes      Need for Family  Participation in Patient Care: Yes (Comment) Care giver support system in place?: Yes (comment)   Criminal Activity/Legal Involvement Pertinent to Current Situation/Hospitalization: No - Comment as needed  Activities of Daily Living Home Assistive Devices/Equipment: None ADL Screening (condition at time of admission) Patient's cognitive ability adequate to safely complete daily activities?: Yes Is the patient deaf or have difficulty hearing?: No Does the patient have difficulty seeing, even when wearing glasses/contacts?: No Does the patient have difficulty concentrating, remembering, or making decisions?: No Patient able to express need for assistance with ADLs?: Yes Does the patient have difficulty dressing or bathing?: No Independently performs ADLs?: Yes (appropriate for developmental age) Does the patient have difficulty walking or climbing stairs?: Yes Weakness of Legs: Both Weakness of Arms/Hands: None  Permission Sought/Granted                  Emotional Assessment Appearance:: Appears stated age Attitude/Demeanor/Rapport: Engaged Affect (typically observed): Accepting Orientation: : Oriented to Self, Oriented to Place, Oriented to  Time, Oriented to Situation Alcohol / Substance Use: Not Applicable Psych Involvement: No (comment)  Admission diagnosis:  Diverticulitis [K57.92] Diverticulitis of small bowel [K57.12] Abdominal pain, unspecified abdominal location [R10.9] Patient Active Problem List   Diagnosis Date Noted   History of COVID-19 06/02/2022   Diverticulitis large intestine 06/02/2022   Nodule of kidney 06/02/2022   Compression fracture of lumbar vertebra ([place]) 06/02/2022   History of liver disease 06/02/2022  Diverticulitis 06/02/2022   Diverticulitis of small bowel 06/01/2022   Urinary urgency 06/01/2022   Near syncope 06/21/2021   Acute blood loss anemia 06/21/2021   Syncope and collapse 06/20/2021   Fall    Hypotension    Hematoma    PAF  (paroxysmal atrial fibrillation) ([place]) 08/20/2016   Atrial fibrillation ([place]) 05/13/2011   Constrictive pericarditis 05/13/2011   Supraventricular tachycardia ([place]) 05/13/2011   Hypertension 05/13/2011   Gout 05/13/2011   PCP:  Faustino Congress, NP Pharmacy:   [id number] [id number]: [phone]: [phone]    Social Determinants of Health (SDOH) Interventions    Readmission Risk Interventions     No data to display

## 2022-06-05 NOTE — Progress Notes (Signed)
[place] for Heparin Indication: atrial fibrillation  Allergies  Allergen Reactions   Corticosteroids Other (See Comments)    Liver scarring as a child   Covid-19 (Mrna) Vaccine Other (See Comments)    Postural Orthostatic Tachycardia Syndrome (POTS)   Other Other (See Comments)    Cannot take STEROIDS-unknown reaction-caused by internal scarring on liver.    Anesthesia precaution due to excessive liver scarring    Pacerone [Amiodarone] Other (See Comments)    Dizziness Visual hallucinations   Erythromycin Nausea And Vomiting and Rash   Zyloprim [Allopurinol] Rash and Other (See Comments)    Skin peeling More severe gout     Patient Measurements: Height: 5' 2.25" (158.1 cm) Weight: 63 kg (139 lb) IBW/kg (Calculated) : 50.68  Heparin Dosing Weight: 63 kg  Vital Signs: Temp: 98.4 F (36.9 C) (08/26 0821) Temp Source: Oral (08/26 0821) BP: 119/56 (08/26 0821) Pulse Rate: 83 (08/26 0821)  Labs: Recent Labs    06/03/22 0225 06/04/22 0119 06/04/22 1355 06/04/22 1411 06/04/22 2302 06/05/22 0813  HGB 11.0* 9.7*  --   --  9.6* 10.6*  HCT 31.4* 28.3*  --   --  28.3* 31.4*  PLT 188 161  --   --  200 262  APTT 55*  --   --  44*  --   --   HEPARINUNFRC  --   --  0.15*  --  0.11* 0.23*  CREATININE 0.85 0.71  --   --  0.73  --     Estimated Creatinine Clearance: 50.1 mL/min (by C-G formula based on SCr of 0.73 mg/dL).    Assessment: 81 y.o. female with medical history significant of A.Fib on Xarelto prior to admission. Last dose reported 8/21 at 2100. Pharmacy consulted to manage heparin while Xarelto on hold.   Heparin infusion restarted 8/25 AM. Heparin level subtherapeutic but increasing, now 0.23. No issues with heparin infusion or signs bleeding reported. CBC stable.   Goal of Therapy:  Heparin level 0.3-0.7 units/ml Monitor platelets by anticoagulation protocol: Yes   Plan:  Increase heparin infusion to 1350  units/hr Check heparin level in 8 hours and daily while on heparin Continue to monitor H&H and platelets    Thank you for allowing pharmacy to be a part of this patient's care.  Ardyth Harps, PharmD Clinical Pharmacist

## 2022-06-06 DIAGNOSIS — K5712 Diverticulitis of small intestine without perforation or abscess without bleeding: Secondary | ICD-10-CM | POA: Diagnosis not present

## 2022-06-06 DIAGNOSIS — E876 Hypokalemia: Secondary | ICD-10-CM

## 2022-06-06 LAB — HEPARIN LEVEL (UNFRACTIONATED): Heparin Unfractionated: 0.41 IU/mL (ref 0.30–0.70)

## 2022-06-06 LAB — CBC
HCT: 27.8 % — ABNORMAL LOW (ref 36.0–46.0)
Hemoglobin: 9.5 g/dL — ABNORMAL LOW (ref 12.0–15.0)
MCH: 30.3 pg (ref 26.0–34.0)
MCHC: 34.2 g/dL (ref 30.0–36.0)
MCV: 88.5 fL (ref 80.0–100.0)
Platelets: 224 10*3/uL (ref 150–400)
RBC: 3.14 MIL/uL — ABNORMAL LOW (ref 3.87–5.11)
RDW: 13.5 % (ref 11.5–15.5)
WBC: 6.7 10*3/uL (ref 4.0–10.5)
nRBC: 0 % (ref 0.0–0.2)

## 2022-06-06 LAB — COMPREHENSIVE METABOLIC PANEL
ALT: 20 U/L (ref 0–44)
AST: 30 U/L (ref 15–41)
Albumin: 2.7 g/dL — ABNORMAL LOW (ref 3.5–5.0)
Alkaline Phosphatase: 87 U/L (ref 38–126)
Anion gap: 14 (ref 5–15)
BUN: 9 mg/dL (ref 8–23)
CO2: 25 mmol/L (ref 22–32)
Calcium: 7.7 mg/dL — ABNORMAL LOW (ref 8.9–10.3)
Chloride: 99 mmol/L (ref 98–111)
Creatinine, Ser: 0.81 mg/dL (ref 0.44–1.00)
GFR, Estimated: >60 mL/min (ref >60)
Glucose, Bld: 92 mg/dL (ref 70–99)
Potassium: 2.8 mmol/L — ABNORMAL LOW (ref 3.5–5.1)
Sodium: 138 mmol/L (ref 135–145)
Total Bilirubin: 1.4 mg/dL — ABNORMAL HIGH (ref 0.3–1.2)
Total Protein: 6 g/dL — ABNORMAL LOW (ref 6.5–8.1)

## 2022-06-06 LAB — MAGNESIUM: Magnesium: 2 mg/dL (ref 1.7–2.4)

## 2022-06-06 MED ORDER — ACETAMINOPHEN 10 MG/ML IV SOLN
1000.0000 mg | Freq: Four times a day (QID) | INTRAVENOUS | Status: AC
Start: 2022-06-06 — End: 2022-06-07
  Administered 2022-06-07 (×2): 1000 mg via INTRAVENOUS
  Filled 2022-06-06: qty 100

## 2022-06-06 MED ORDER — POTASSIUM CHLORIDE 10 MEQ/100ML IV SOLN
10.0000 meq | INTRAVENOUS | Status: DC
  Administered 2022-06-06 (×2): 10 meq via INTRAVENOUS
  Filled 2022-06-06 (×2): qty 100

## 2022-06-06 MED ORDER — POTASSIUM CL IN DEXTROSE 5% 20 MEQ/L IV SOLN
20.0000 meq | INTRAVENOUS | Status: DC
  Administered 2022-06-06 – 2022-06-07 (×2): 20 meq via INTRAVENOUS
  Filled 2022-06-06 (×4): qty 1000

## 2022-06-06 MED ORDER — SALINE SPRAY 0.65 % NA SOLN
1.0000 | NASAL | Status: DC | PRN
  Filled 2022-06-06: qty 44

## 2022-06-06 MED ORDER — PANTOPRAZOLE SODIUM 40 MG IV SOLR
40.0000 mg | INTRAVENOUS | Status: DC
  Administered 2022-06-06 – 2022-06-10 (×5): 40 mg via INTRAVENOUS
  Filled 2022-06-06 (×6): qty 10

## 2022-06-06 NOTE — Assessment & Plan Note (Addendum)
Replace and follow  Check mag

## 2022-06-06 NOTE — Plan of Care (Signed)
  Problem: Education: Goal: Knowledge of General Education information will improve Description: Including pain rating scale, medication(s)/side effects and non-pharmacologic comfort measures Outcome: Not Progressing   Problem: Health Behavior/Discharge Planning: Goal: Ability to manage health-related needs will improve Outcome: Not Progressing   Problem: Clinical Measurements: Goal: Ability to maintain clinical measurements within normal limits will improve Outcome: Not Progressing Goal: Will remain free from infection Outcome: Not Progressing Goal: Diagnostic test results will improve Outcome: Not Progressing Goal: Respiratory complications will improve Outcome: Not Progressing Goal: Cardiovascular complication will be avoided Outcome: Not Progressing   Problem: Activity: Goal: Risk for activity intolerance will decrease Outcome: Not Progressing   Problem: Coping: Goal: Level of anxiety will decrease Outcome: Not Progressing   Problem: Elimination: Goal: Will not experience complications related to bowel motility Outcome: Not Progressing Goal: Will not experience complications related to urinary retention Outcome: Not Progressing

## 2022-06-06 NOTE — Progress Notes (Signed)
[place] for Heparin Indication: atrial fibrillation  Allergies  Allergen Reactions   Corticosteroids Other (See Comments)    Liver scarring as a child   Covid-19 (Mrna) Vaccine Other (See Comments)    Postural Orthostatic Tachycardia Syndrome (POTS)   Other Other (See Comments)    Cannot take STEROIDS-unknown reaction-caused by internal scarring on liver.    Anesthesia precaution due to excessive liver scarring    Pacerone [Amiodarone] Other (See Comments)    Dizziness Visual hallucinations   Erythromycin Nausea And Vomiting and Rash   Zyloprim [Allopurinol] Rash and Other (See Comments)    Skin peeling More severe gout     Patient Measurements: Height: 5' 2.25" (158.1 cm) Weight: 63 kg (139 lb) IBW/kg (Calculated) : 50.68  Heparin Dosing Weight: 63 kg  Vital Signs: Temp: 98.2 F (36.8 C) (08/26 2245) Temp Source: Oral (08/26 2245) BP: 115/52 (08/27 0015) Pulse Rate: 83 (08/27 0015)  Labs: Recent Labs    06/04/22 0119 06/04/22 1355 06/04/22 1411 06/04/22 2302 06/05/22 0813 06/05/22 1823 06/06/22 0101  HGB 9.7*  --   --  9.6* 10.6*  --  9.5*  HCT 28.3*  --   --  28.3* 31.4*  --  27.8*  PLT 161  --   --  200 262  --  224  APTT  --   --  44*  --   --   --   --   HEPARINUNFRC  --    < >  --  0.11* 0.23* 0.19* 0.41  CREATININE 0.71  --   --  0.73  --   --   --    < > = values in this interval not displayed.    Estimated Creatinine Clearance: 50.1 mL/min (by C-G formula based on SCr of 0.73 mg/dL).    Assessment: 81 y.o. female with medical history significant of A.Fib on Xarelto prior to admission. Last dose reported 8/21 at 2100. Pharmacy consulted to manage heparin while Xarelto on hold.   Heparin infusion restarted 8/25 AM. Heparin level now 0.41 therapeutic on hepairn 1550 units/hr. No issues with heparin infusion or signs of bleeding reported.   Goal of Therapy:  Heparin level 0.3-0.7 units/ml Monitor  platelets by anticoagulation protocol: Yes   Plan:  Continue heparin infusion at 1550 units/hr Check heparin level in 8 hours and daily while on heparin Continue to monitor H&H and platelets    Thank you for allowing pharmacy to be a part of this patient's care.  Ardyth Harps, PharmD Clinical Pharmacist

## 2022-06-06 NOTE — Plan of Care (Signed)

## 2022-06-06 NOTE — Progress Notes (Signed)
[facility] Progress Note  3 Days Post-Op  Subjective: CC-  NG output not recorded. RN cannot tell me how much has been draining the last 24 hours. Patient states her abdominal pain is about the same. Mild nausea at times, no emesis. No flatus or BM. She has already been up the the chair this morning. NG effluent is blood tinged this morning. Hgb 9.5 from 10.6.  Objective: Vital signs in last 24 hours: Temp:  [97.8 F (36.6 C)-98.2 F (36.8 C)] 98.2 F (36.8 C) (08/26 2245) Pulse Rate:  [83-96] 83 (08/27 0015) Resp:  [16-18] 18 (08/27 0015) BP: (114-129)/(46-57) 115/52 (08/27 0015) SpO2:  [95 %-98 %] 96 % (08/27 0015) Last BM Date : 06/03/22  Intake/Output from previous day: 08/26 0701 - 08/27 0700 In: -  Out: 500 [Urine:500] Intake/Output this shift: No intake/output data recorded.  PE: Gen:  Alert, NAD Cardio: A fib, rate 80-90s while I was in the room Pulm: rate and effort normal Abd: Soft, minimal distension, appropriately tender mostly over incision, hypoactive BS, open midline incision with healthy granulation tissue and no cellulitis or purulent drainage/ no bleeding  Lab Results:  Recent Labs    06/05/22 0813 06/06/22 0101  WBC 9.0 6.7  HGB 10.6* 9.5*  HCT 31.4* 27.8*  PLT 262 224   BMET Recent Labs    06/04/22 0119 06/04/22 2302  NA 137 139  K 3.5 3.6  CL 106 106  CO2 22 26  GLUCOSE 97 89  BUN 10 10  CREATININE 0.71 0.73  CALCIUM 7.4* 7.5*   PT/INR No results for input(s): "LABPROT", "INR" in the last 72 hours. CMP     Component Value Date/Time   NA 139 06/04/2022 2302   NA 138 04/06/2022 1429   K 3.6 06/04/2022 2302   CL 106 06/04/2022 2302   CO2 26 06/04/2022 2302   GLUCOSE 89 06/04/2022 2302   BUN 10 06/04/2022 2302   BUN 10 04/06/2022 1429   CREATININE 0.73 06/04/2022 2302   CREATININE 0.90 08/12/2016 1022   CALCIUM 7.5 (L) 06/04/2022 2302   PROT 4.6 (L) 06/04/2022 0119   PROT 6.9 11/21/2019 1035   ALBUMIN 2.0 (L)  06/04/2022 0119   ALBUMIN 4.5 11/21/2019 1035   AST 19 06/04/2022 0119   ALT 14 06/04/2022 0119   ALKPHOS 47 06/04/2022 0119   BILITOT 1.4 (H) 06/04/2022 0119   BILITOT 0.9 11/21/2019 1035   GFRNONAA >60 06/04/2022 2302   GFRAA 66 11/21/2019 1035   Lipase     Component Value Date/Time   LIPASE 44 05/31/2022 2344       Studies/Results: No results found.  Anti-infectives: Anti-infectives (From admission, onward)    Start     Dose/Rate Route Frequency Ordered Stop   06/03/22 1400  piperacillin-tazobactam (ZOSYN) IVPB 3.375 g        3.375 g 12.5 mL/hr over 240 Minutes Intravenous Every 8 hours 06/03/22 1346 06/08/22 1359   06/01/22 1200  piperacillin-tazobactam (ZOSYN) IVPB 3.375 g  Status:  Discontinued        3.375 g 12.5 mL/hr over 240 Minutes Intravenous Every 8 hours 06/01/22 0335 06/03/22 1346   06/01/22 0315  piperacillin-tazobactam (ZOSYN) IVPB 3.375 g        3.375 g 100 mL/hr over 30 Minutes Intravenous  Once 06/01/22 0305 06/01/22 0408        Assessment/Plan JEJUNAL DIVERTICULITIS with necrosis  -POD#3 EXPLORATORY LAPAROTOMY, SMALL BOWEL RESECTION 8/24 Dr. Redmond Pulling - Continue NPO/NGT to [facility] and  await return in bowel function - add IV PPI and monitor NG output - blood tinged this morning. Hgb 9.5 from 10.6 on heparin gtt, monitor closely - Mobilize, PT/OT - recommending HH PT - continue IV tylenol for pain control - BID wet to dry dressing changes - patient has been NPO since admission 8/22. Will check prealbumin in the AM. May need to consider PICC/TPN if unable to advance diet soon   ID - zosyn 8/22>>8/29 (5 days postop) FEN - IVF, NGT to LIWS, NPO except ice chips and a popsicle VTE - heparin gtt Foley - d/c 8/25 and voiding   Paroxysmal A Fib - xarelto on hold, restart heparin gtt this morning. Cards has seen, high risk for A fib postop and they have given medication recommendations History of constrictive pericarditis s/p pericardial stripping in  2006 HTN Recent Covid-19 infection (diagnosed 05/19/22)    LOS: 4 days    [name], [facility] 06/06/2022, 10:33 AM Please see Amion for pager number during day hours 7:00am-4:30pm

## 2022-06-06 NOTE — Progress Notes (Signed)
PROGRESS NOTE    Yolanda Bell  VOJ:[id number] DOB: 1941/04/20 DOA: 05/31/2022 PCP: Faustino Congress, NP  Chief Complaint  Patient presents with   Abdominal Pain    Brief Narrative:  81 yo with hx atrial fibrillation on xarelto, HTN, recent covid 19 infection who presented with diffuse abdominal pain, nausea, and vomiting as well as urinary urgency/hesitation.  See below for additional details    Assessment & Plan:   Principal Problem:   Diverticulitis of small bowel Active Problems:   Diverticulitis large intestine   Urinary urgency   Hypertension   PAF (paroxysmal atrial fibrillation) (HCC)   Nodule of kidney   Compression fracture of lumbar vertebra (HCC)   History of COVID-19   History of liver disease   Diverticulitis   Assessment and Plan: * Diverticulitis of small bowel Giant small bowel diverticulum, (apparently of jejunum) with acute diverticulitis.  Diverticulum measures 5.3 x 7.3 x 6.2 cm up from 2.5cm in 2022. Also mild diverticulitis of sigmoid. Inflammation about bladder likely causing sensation of urinary urgency. NPO IVF Zosyn Morphine PRN pain Switching Xarelto to heparin gtt Appreciate general surgery recs ->now s/p small bowel resection and ex lap on 8/24 -> npo until return in bowel function, wound care per general surgery  Diverticulitis large intestine abx Appreciate general surgery recs  Urinary urgency Inflammation about bladder from diverticulitis likely to be causing todays sensation of urinary urgency / retention (as noted on CT scan).  Pt doesn't appear to actually be retaining from bladder scans. UA not c/w UTI    Hypertension Cont BB Hold diuretics.  PAF (paroxysmal atrial fibrillation) (HCC) Cont BB. Hold Xarelto, Heparin per pharm to resume 8/25 AM On scheduled IV metoprolol for now Seen by cardiology, note can hold anticoagulation as needed, continue beta blocker, high risk for post surgical atrial fib  Nodule of  kidney Concerning for renal cell carcinoma Needs additional imaging   Compression fracture of lumbar vertebra (HCC) Compression Fx of L5, while severe, is old, stable on imaging and chronic (was chronic even on 06/2021 CT).  History of COVID-19 August 9th symptoms started, tested positive 8/10 8/17 she reports home test was negative Has some residual L ear pain, otherwise sx improved  History of liver disease Unclear hx surrounding this Related to steroids/volume? unclear Would recommend follow up outpatient     DVT prophylaxis: heparin gtt Code Status: full Family Communication: none - son at bedside Disposition:   Status is: Observation The patient will require care spanning > 2 midnights and should be moved to inpatient because: need for abx/surgery   Consultants:  Surgery cardiology  Procedures:  none  Antimicrobials:  Anti-infectives (From admission, onward)    Start     Dose/Rate Route Frequency Ordered Stop   06/03/22 1400  piperacillin-tazobactam (ZOSYN) IVPB 3.375 g        3.375 g 12.5 mL/hr over 240 Minutes Intravenous Every 8 hours 06/03/22 1346 06/08/22 1359   06/01/22 1200  piperacillin-tazobactam (ZOSYN) IVPB 3.375 g  Status:  Discontinued        3.375 g 12.5 mL/hr over 240 Minutes Intravenous Every 8 hours 06/01/22 0335 06/03/22 1346   06/01/22 0315  piperacillin-tazobactam (ZOSYN) IVPB 3.375 g        3.375 g 100 mL/hr over 30 Minutes Intravenous  Once 06/01/22 0305 06/01/22 0408       Subjective: No new complaints  Objective: Vitals:   06/05/22 1631 06/05/22 2245 06/06/22 0015 06/06/22 1245  BP: (!) 129/57 Marland Kitchen)  114/46 (!) 115/52 119/65  Pulse: 96 87 83 74  Resp: '18 16 18 16  '$ Temp: 97.8 F (36.6 C) 98.2 F (36.8 C)  98.4 F (36.9 C)  TempSrc:  Oral  Oral  SpO2: 95% 98% 96%   Weight:      Height:       No intake or output data in the 24 hours ending 06/06/22 1516  Filed Weights   05/31/22 2332 06/03/22 0939  Weight: 63 kg 63 kg     Examination:  General: No acute distress. Cardiovascular: RRR, sinus with ectopy on tele Lungs: unlabored Abdomen: midline dressing Neurological: Alert and oriented 3. Moves all extremities 4 with equal strength. Cranial nerves II through XII grossly intact. Extremities: No clubbing or cyanosis. No edema  Data Reviewed: I have personally reviewed following labs and imaging studies  CBC: Recent Labs  Lab 06/03/22 0225 06/04/22 0119 06/04/22 2302 06/05/22 0813 06/06/22 0101  WBC 14.6* 9.2 7.2 9.0 6.7  NEUTROABS 13.3* 8.2*  --   --   --   HGB 11.0* 9.7* 9.6* 10.6* 9.5*  HCT 31.4* 28.3* 28.3* 31.4* 27.8*  MCV 89.0 89.3 89.0 90.2 88.5  PLT 188 161 200 262 629    Basic Metabolic Panel: Recent Labs  Lab 06/01/22 0424 06/03/22 0225 06/04/22 0119 06/04/22 2302 06/06/22 0915  NA 136 135 137 139 138  K 3.9 3.4* 3.5 3.6 2.8*  CL 103 102 106 106 99  CO2 '23 24 22 26 25  '$ GLUCOSE 118* 104* 97 89 92  BUN '8 10 10 10 9  '$ CREATININE 0.75 0.85 0.71 0.73 0.81  CALCIUM 8.5* 8.3* 7.4* 7.5* 7.7*  MG  --  1.9 1.8  --   --   PHOS  --  2.0* 3.0  --   --     GFR: Estimated Creatinine Clearance: 49.5 mL/min (by C-G formula based on SCr of 0.81 mg/dL).  Liver Function Tests: Recent Labs  Lab 05/31/22 2344 06/03/22 0225 06/04/22 0119 06/06/22 0915  AST '18 22 19 30  '$ ALT '12 17 14 20  '$ ALKPHOS 53 60 47 87  BILITOT 0.5 2.3* 1.4* 1.4*  PROT 6.4* 6.1* 4.6* 6.0*  ALBUMIN 3.8 3.0* 2.0* 2.7*    CBG: No results for input(s): "GLUCAP" in the last 168 hours.   Recent Results (from the past 240 hour(s))  Surgical PCR screen     Status: None   Collection Time: 06/03/22  2:52 AM   Specimen: Nasal Mucosa; Nasal Swab  Result Value Ref Range Status   MRSA, PCR NEGATIVE NEGATIVE Final   Staphylococcus aureus NEGATIVE NEGATIVE Final    Comment: (NOTE) The Xpert SA Assay (FDA approved for NASAL specimens in patients 68 years of age and older), is one component of [place] Bonsignore  comprehensive surveillance program. It is not intended to diagnose infection nor to guide or monitor treatment. Performed at [facility], [place] [address] [place], [place] [address]          Radiology Studies: No results found.      Scheduled Meds:  metoprolol tartrate  5 mg Intravenous Q6H   pantoprazole (PROTONIX) IV  40 mg Intravenous Q24H   Continuous Infusions:  acetaminophen     heparin 1,550 Units/hr (06/05/22 2031)   lactated ringers 75 mL/hr at 06/03/22 1707   methocarbamol (ROBAXIN) IV     piperacillin-tazobactam (ZOSYN)  IV 3.375 g (06/06/22 1514)     LOS: 4 days    Time spent: over 30 min  Fayrene Helper, MD [facility]   To contact the attending provider between 7A-7P or the covering provider during after hours 7P-7A, please log into the web site [email or web address] and access using universal [place] password for that web site. If you do not have the password, please call the hospital operator.  06/06/2022, 3:16 PM

## 2022-06-07 DIAGNOSIS — K922 Gastrointestinal hemorrhage, unspecified: Secondary | ICD-10-CM

## 2022-06-07 DIAGNOSIS — K5712 Diverticulitis of small intestine without perforation or abscess without bleeding: Secondary | ICD-10-CM | POA: Diagnosis not present

## 2022-06-07 LAB — CBC
HCT: 23.7 % — ABNORMAL LOW (ref 36.0–46.0)
Hemoglobin: 7.9 g/dL — ABNORMAL LOW (ref 12.0–15.0)
MCH: 29.7 pg (ref 26.0–34.0)
MCHC: 33.3 g/dL (ref 30.0–36.0)
MCV: 89.1 fL (ref 80.0–100.0)
Platelets: 218 10*3/uL (ref 150–400)
RBC: 2.66 MIL/uL — ABNORMAL LOW (ref 3.87–5.11)
RDW: 13.5 % (ref 11.5–15.5)
WBC: 5.9 10*3/uL (ref 4.0–10.5)
nRBC: 0 % (ref 0.0–0.2)

## 2022-06-07 LAB — CBC WITH DIFFERENTIAL/PLATELET
Abs Immature Granulocytes: 0.21 10*3/uL — ABNORMAL HIGH (ref 0.00–0.07)
Basophils Absolute: 0 10*3/uL (ref 0.0–0.1)
Basophils Relative: 1 %
Eosinophils Absolute: 0 10*3/uL (ref 0.0–0.5)
Eosinophils Relative: 0 %
HCT: 26.4 % — ABNORMAL LOW (ref 36.0–46.0)
Hemoglobin: 8.7 g/dL — ABNORMAL LOW (ref 12.0–15.0)
Immature Granulocytes: 4 %
Lymphocytes Relative: 18 %
Lymphs Abs: 0.9 10*3/uL (ref 0.7–4.0)
MCH: 29.9 pg (ref 26.0–34.0)
MCHC: 33 g/dL (ref 30.0–36.0)
MCV: 90.7 fL (ref 80.0–100.0)
Monocytes Absolute: 0.2 10*3/uL (ref 0.1–1.0)
Monocytes Relative: 4 %
Neutro Abs: 3.9 10*3/uL (ref 1.7–7.7)
Neutrophils Relative %: 73 %
Platelets: 244 10*3/uL (ref 150–400)
RBC: 2.91 MIL/uL — ABNORMAL LOW (ref 3.87–5.11)
RDW: 13.4 % (ref 11.5–15.5)
WBC: 5.3 10*3/uL (ref 4.0–10.5)
nRBC: 0 % (ref 0.0–0.2)

## 2022-06-07 LAB — COMPREHENSIVE METABOLIC PANEL
ALT: 17 U/L (ref 0–44)
AST: 27 U/L (ref 15–41)
Albumin: 2.3 g/dL — ABNORMAL LOW (ref 3.5–5.0)
Alkaline Phosphatase: 66 U/L (ref 38–126)
Anion gap: 9 (ref 5–15)
BUN: 6 mg/dL — ABNORMAL LOW (ref 8–23)
CO2: 27 mmol/L (ref 22–32)
Calcium: 7.2 mg/dL — ABNORMAL LOW (ref 8.9–10.3)
Chloride: 103 mmol/L (ref 98–111)
Creatinine, Ser: 0.59 mg/dL (ref 0.44–1.00)
GFR, Estimated: >60 mL/min (ref >60)
Glucose, Bld: 113 mg/dL — ABNORMAL HIGH (ref 70–99)
Potassium: 3.1 mmol/L — ABNORMAL LOW (ref 3.5–5.1)
Sodium: 139 mmol/L (ref 135–145)
Total Bilirubin: 1 mg/dL (ref 0.3–1.2)
Total Protein: 5.1 g/dL — ABNORMAL LOW (ref 6.5–8.1)

## 2022-06-07 LAB — MAGNESIUM: Magnesium: 1.9 mg/dL (ref 1.7–2.4)

## 2022-06-07 LAB — SURGICAL PATHOLOGY

## 2022-06-07 LAB — TYPE AND SCREEN
ABO/RH(D): A POS
Antibody Screen: NEGATIVE

## 2022-06-07 LAB — PHOSPHORUS: Phosphorus: 1.3 mg/dL — ABNORMAL LOW (ref 2.5–4.6)

## 2022-06-07 LAB — HEMOGLOBIN AND HEMATOCRIT, BLOOD
HCT: 25.6 % — ABNORMAL LOW (ref 36.0–46.0)
Hemoglobin: 8.6 g/dL — ABNORMAL LOW (ref 12.0–15.0)

## 2022-06-07 LAB — PREALBUMIN: Prealbumin: 8 mg/dL — ABNORMAL LOW (ref 18–38)

## 2022-06-07 LAB — HEPARIN LEVEL (UNFRACTIONATED): Heparin Unfractionated: 0.65 IU/mL (ref 0.30–0.70)

## 2022-06-07 MED ORDER — ACETAMINOPHEN 500 MG PO TABS
1000.0000 mg | ORAL_TABLET | Freq: Four times a day (QID) | ORAL | Status: DC
  Administered 2022-06-07 – 2022-06-08 (×2): 1000 mg via ORAL
  Filled 2022-06-07 (×3): qty 2

## 2022-06-07 MED ORDER — POTASSIUM CHLORIDE 10 MEQ/100ML IV SOLN
10.0000 meq | INTRAVENOUS | Status: AC
  Administered 2022-06-07 (×2): 10 meq via INTRAVENOUS
  Filled 2022-06-07 (×2): qty 100

## 2022-06-07 MED ORDER — POTASSIUM PHOSPHATES 15 MMOLE/5ML IV SOLN
15.0000 mmol | Freq: Once | INTRAVENOUS | Status: AC
  Administered 2022-06-07: 15 mmol via INTRAVENOUS
  Filled 2022-06-07: qty 5

## 2022-06-07 MED ORDER — POTASSIUM CHLORIDE 10 MEQ/100ML IV SOLN
INTRAVENOUS | Status: AC
  Administered 2022-06-07: 10 meq
  Filled 2022-06-07: qty 100

## 2022-06-07 MED ORDER — POTASSIUM CHLORIDE 10 MEQ/100ML IV SOLN
10.0000 meq | INTRAVENOUS | Status: AC
  Administered 2022-06-07 (×3): 10 meq via INTRAVENOUS
  Filled 2022-06-07 (×3): qty 100

## 2022-06-07 NOTE — Progress Notes (Signed)
PROGRESS NOTE    Yolanda Bell  [id number]:[id number] DOB: Nov 25, 1940 DOA: 05/31/2022 PCP: Faustino Congress, NP  Chief Complaint  Patient presents with   Abdominal Pain    Brief Narrative:  81 yo with hx atrial fibrillation on xarelto, HTN, recent covid 19 infection who presented with diffuse abdominal pain, nausea, and vomiting as well as urinary urgency/hesitation.  See below for additional details    Assessment & Plan:   Principal Problem:   Diverticulitis of small bowel Active Problems:   Diverticulitis large intestine   Urinary urgency   Hypertension   PAF (paroxysmal atrial fibrillation) (HCC)   Hypophosphatemia   Nodule of kidney   Hypokalemia   Compression fracture of lumbar vertebra (HCC)   History of COVID-19   History of liver disease   Diverticulitis   Assessment and Plan: * Diverticulitis of small bowel Giant small bowel diverticulum, (apparently of jejunum) with acute diverticulitis.  Diverticulum measures 5.3 x 7.3 x 6.2 cm up from 2.5cm in 2022. Also mild diverticulitis of sigmoid. Inflammation about bladder likely causing sensation of urinary urgency. NPO IVF Zosyn Morphine PRN pain Switching Xarelto to heparin gtt Appreciate general surgery recs ->now s/p small bowel resection and ex lap on 8/24 -> d/c NGT today, concern for oozing from SB anastomosis, holding heparin for now, per surgery  Diverticulitis large intestine abx Appreciate general surgery recs  Urinary urgency Inflammation about bladder from diverticulitis likely to be causing todays sensation of urinary urgency / retention (as noted on CT scan).  Pt doesn't appear to actually be retaining from bladder scans. UA not c/w UTI    Hypertension Cont BB Hold diuretics.  Hypophosphatemia Replace and follow  PAF (paroxysmal atrial fibrillation) (HCC) Cont BB. Hold Xarelto, Heparin per pharm to resume 8/25 AM On scheduled IV metoprolol for now Seen by cardiology, note can hold  anticoagulation as needed, continue beta blocker, high risk for post surgical atrial fib  Hypokalemia Replace and follow  Check mag  Nodule of kidney Concerning for renal cell carcinoma Needs additional imaging  Discussed with pt  Compression fracture of lumbar vertebra (HCC) Compression Fx of L5, while severe, is old, stable on imaging and chronic (was chronic even on 06/2021 CT).  History of COVID-19 August 9th symptoms started, tested positive 8/10 8/17 she reports home test was negative Has some residual L ear pain, otherwise sx improved  History of liver disease Unclear hx surrounding this Related to steroids/volume? unclear Would recommend follow up outpatient     DVT prophylaxis: heparin gtt Code Status: full Family Communication: none - son at bedside Disposition:   Status is: Observation The patient will require care spanning > 2 midnights and should be moved to inpatient because: need for abx/surgery   Consultants:  Surgery cardiology  Procedures:  none  Antimicrobials:  Anti-infectives (From admission, onward)    Start     Dose/Rate Route Frequency Ordered Stop   06/03/22 1400  piperacillin-tazobactam (ZOSYN) IVPB 3.375 g        3.375 g 12.5 mL/hr over 240 Minutes Intravenous Every 8 hours 06/03/22 1346 06/08/22 1359   06/01/22 1200  piperacillin-tazobactam (ZOSYN) IVPB 3.375 g  Status:  Discontinued        3.375 g 12.5 mL/hr over 240 Minutes Intravenous Every 8 hours 06/01/22 0335 06/03/22 1346   06/01/22 0315  piperacillin-tazobactam (ZOSYN) IVPB 3.375 g        3.375 g 100 mL/hr over 30 Minutes Intravenous  Once 06/01/22 0305 06/01/22 0408  Subjective: No new complaints other than black stool, nose bleeds  Objective: Vitals:   06/06/22 1245 06/06/22 1515 06/07/22 0700 06/07/22 1539  BP: 119/65 124/63 112/61 112/68  Pulse: 74 79  100  Resp: '16 13  19  '$ Temp: 98.4 F (36.9 C)  98.6 F (37 C) 98.1 F (36.7 C)  TempSrc: Oral  Oral  Oral  SpO2:  97%  93%  Weight:      Height:        Intake/Output Summary (Last 24 hours) at 06/07/2022 1838 Last data filed at 06/07/2022 0451 Gross per 24 hour  Intake --  Output 1400 ml  Net -1400 ml    Filed Weights   05/31/22 2332 06/03/22 0939  Weight: 63 kg 63 kg    Examination:  General: No acute distress. Cardiovascular: RRR Lungs: unlabored Abdomen: midline dressing, appropriately tender Neurological: Alert and oriented 3. Moves all extremities 4 with equal strength. Cranial nerves II through XII grossly intact. Extremities: No clubbing or cyanosis. No edema.   Data Reviewed: I have personally reviewed following labs and imaging studies  CBC: Recent Labs  Lab 06/03/22 0225 06/04/22 0119 06/04/22 2302 06/05/22 0813 06/06/22 0101 06/07/22 0205 06/07/22 1343  WBC 14.6* 9.2 7.2 9.0 6.7 5.3 5.9  NEUTROABS 13.3* 8.2*  --   --   --  3.9  --   HGB 11.0* 9.7* 9.6* 10.6* 9.5* 8.7* 7.9*  HCT 31.4* 28.3* 28.3* 31.4* 27.8* 26.4* 23.7*  MCV 89.0 89.3 89.0 90.2 88.5 90.7 89.1  PLT 188 161 200 262 224 244 389    Basic Metabolic Panel: Recent Labs  Lab 06/03/22 0225 06/04/22 0119 06/04/22 2302 06/06/22 0915 06/07/22 0205  NA 135 137 139 138 139  K 3.4* 3.5 3.6 2.8* 3.1*  CL 102 106 106 99 103  CO2 '24 22 26 25 27  '$ GLUCOSE 104* 97 89 92 113*  BUN '10 10 10 9 '$ 6*  CREATININE 0.85 0.71 0.73 0.81 0.59  CALCIUM 8.3* 7.4* 7.5* 7.7* 7.2*  MG 1.9 1.8  --  2.0 1.9  PHOS 2.0* 3.0  --   --  1.3*    GFR: Estimated Creatinine Clearance: 50.1 mL/min (by C-G formula based on SCr of 0.59 mg/dL).  Liver Function Tests: Recent Labs  Lab 05/31/22 2344 06/03/22 0225 06/04/22 0119 06/06/22 0915 06/07/22 0205  AST '18 22 19 30 27  '$ ALT '12 17 14 20 17  '$ ALKPHOS 53 60 47 87 66  BILITOT 0.5 2.3* 1.4* 1.4* 1.0  PROT 6.4* 6.1* 4.6* 6.0* 5.1*  ALBUMIN 3.8 3.0* 2.0* 2.7* 2.3*    CBG: No results for input(s): "GLUCAP" in the last 168 hours.   Recent Results (from the  past 240 hour(s))  Surgical PCR screen     Status: None   Collection Time: 06/03/22  2:52 AM   Specimen: Nasal Mucosa; Nasal Swab  Result Value Ref Range Status   MRSA, PCR NEGATIVE NEGATIVE Final   Staphylococcus aureus NEGATIVE NEGATIVE Final    Comment: (NOTE) The Xpert SA Assay (FDA approved for NASAL specimens in patients 79 years of age and older), is one component of [place] comprehensive surveillance program. It is not intended to diagnose infection nor to guide or monitor treatment. Performed at [facility], [place] [address] [place],  [address]          Radiology Studies: No results found.      Scheduled Meds:  acetaminophen  1,000 mg Oral Q6H   metoprolol tartrate  5 mg Intravenous Q6H   pantoprazole (PROTONIX) IV  40 mg Intravenous Q24H   Continuous Infusions:  dextrose 5 % with KCl 20 mEq / L 20 mEq (06/07/22 1226)   methocarbamol (ROBAXIN) IV     piperacillin-tazobactam (ZOSYN)  IV 3.375 g (06/07/22 1544)   potassium PHOSPHATE IVPB (in mmol) 15 mmol (06/07/22 1246)     LOS: 5 days    Time spent: over 30 min    Fayrene Helper, MD [facility]   To contact the attending provider between 7A-7P or the covering provider during after hours 7P-7A, please log into the web site [email or web address] and access using universal [place] password for that web site. If you do not have the password, please call the hospital operator.  06/07/2022, 6:38 PM

## 2022-06-07 NOTE — Assessment & Plan Note (Signed)
Replace and follow. ?

## 2022-06-07 NOTE — Progress Notes (Signed)
Physical Therapy Treatment Patient Details Name: Yolanda Bell MRN: [id number] DOB: 06/01/1941 Today's Date: 06/07/2022   History of Present Illness 81 yo admitted who presented with diffuse abdominal pain, nausea, and vomiting as well as urinary urgency/hesitation.  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 PMH:  atrial fibrillation on xarelto, HTN, recent covid 19 infection    PT Comments    Pt admitted with above diagnosis. Pt was able to ambulate with cane with good balance and incr distance today.  Pt is most likely close to baseline. Continue PT.   Pt currently with functional limitations due to balance and endurance deficits. Pt will benefit from skilled PT to increase their independence and safety with mobility to allow discharge to the venue listed below.      Recommendations for follow up therapy are one component of a multi-disciplinary discharge planning process, led by the attending physician.  Recommendations may be updated based on patient status, additional functional criteria and insurance authorization.  Follow Up Recommendations  Home health PT     Assistance Recommended at Discharge PRN  Patient can return home with the following A little help with walking and/or transfers;Assistance with cooking/housework;Assist for transportation;Help with stairs or ramp for entrance   Equipment Recommendations  None recommended by PT    Recommendations for Other Services       Precautions / Restrictions Precautions Precautions: Other (comment) Precaution Comments: NG tube Restrictions Weight Bearing Restrictions: No     Mobility  Bed Mobility Overal bed mobility: Needs Assistance Bed Mobility: Rolling, Sidelying to Sit Rolling: Supervision Sidelying to sit: Supervision       General bed mobility comments: used bed rails    Transfers Overall transfer level: Needs assistance Equipment used: 1 person hand held assist Transfers:  Sit to/from Stand Sit to Stand: Supervision           General transfer comment: supervision for safety    Ambulation/Gait Ambulation/Gait assistance: Supervision, Min guard Gait Distance (Feet): 100 Feet Assistive device: Straight cane Gait Pattern/deviations: Decreased stride length, Step-to pattern   Gait velocity interpretation: <1.31 ft/sec, indicative of household ambulator   General Gait Details: Pt was able to ambulate with the cane with good technique and good safety awareness.  No LOB.   Stairs             Wheelchair Mobility    Modified Rankin (Stroke Patients Only)       Balance Overall balance assessment: No apparent balance deficits (not formally assessed)                                          Cognition Arousal/Alertness: Awake/alert Behavior During Therapy: WFL for tasks assessed/performed Overall Cognitive Status: Within Functional Limits for tasks assessed                                          Exercises General Exercises - Lower Extremity Long Arc Quad: AROM, Both, 10 reps, Seated Hip Flexion/Marching: AROM, Both, 10 reps, Seated    General Comments General comments (skin integrity, edema, etc.): VSS      Pertinent Vitals/Pain Pain Assessment Pain Assessment: Faces Faces Pain Scale: Hurts a little bit Pain Location: left ear, throat left side, abdomen Pain Descriptors / Indicators: Discomfort  Pain Intervention(s): Limited activity within patient's tolerance, Monitored during session, Repositioned    Home Living                          Prior Function            PT Goals (current goals can now be found in the care plan section) Acute Rehab PT Goals Patient Stated Goal: to go home Progress towards PT goals: Progressing toward goals    Frequency    Min 3X/week      PT Plan Current plan remains appropriate    Co-evaluation              AM-PAC PT "6 Clicks"  Mobility   Outcome Measure  Help needed turning from your back to your side while in a flat bed without using bedrails?: A Little Help needed moving from lying on your back to sitting on the side of a flat bed without using bedrails?: A Little Help needed moving to and from a bed to a chair (including a wheelchair)?: A Little Help needed standing up from a chair using your arms (e.g., wheelchair or bedside chair)?: A Little Help needed to walk in hospital room?: A Little Help needed climbing 3-5 steps with a railing? : A Little 6 Click Score: 18    End of Session Equipment Utilized During Treatment: Gait belt Activity Tolerance: Patient limited by fatigue (limited by NG tube to suction) Patient left: in chair;with call bell/phone within reach;with chair alarm set Nurse Communication: Mobility status PT Visit Diagnosis: Muscle weakness (generalized) (M62.81);Other abnormalities of gait and mobility (R26.89)     Time: 0355-9741 PT Time Calculation (min) (ACUTE ONLY): 28 min  Charges:  $Gait Training: 8-22 mins $Therapeutic Exercise: 8-22 mins                     [facility] M,PT Acute Rehab Services [phone]    Alvira Philips 06/07/2022, 3:51 PM

## 2022-06-07 NOTE — Assessment & Plan Note (Signed)
Suspected oozing from small bowel anastomoses Trend Hb/Hct Transfuse for <7 or symptomatic Per surgery Heparin on hold

## 2022-06-07 NOTE — Care Management Important Message (Signed)
Important Message  Patient Details  Name: Yolanda Bell MRN: [id number] Date of Birth: [birth year]   Medicare Important Message Given:  Yes     Hannah Beat 06/07/2022, 12:12 PM

## 2022-06-07 NOTE — Progress Notes (Addendum)
[place] for Heparin Indication: atrial fibrillation  Allergies  Allergen Reactions   Corticosteroids Other (See Comments)    Liver scarring as a child   Covid-19 (Mrna) Vaccine Other (See Comments)    Postural Orthostatic Tachycardia Syndrome (POTS)   Other Other (See Comments)    Cannot take STEROIDS-unknown reaction-caused by internal scarring on liver.    Anesthesia precaution due to excessive liver scarring    Pacerone [Amiodarone] Other (See Comments)    Dizziness Visual hallucinations   Erythromycin Nausea And Vomiting and Rash   Zyloprim [Allopurinol] Rash and Other (See Comments)    Skin peeling More severe gout     Patient Measurements: Height: 5' 2.25" (158.1 cm) Weight: 63 kg (139 lb) IBW/kg (Calculated) : 50.68  Heparin Dosing Weight: 63 kg  Vital Signs: Temp: 98.6 F (37 C) (08/28 0700) Temp Source: Oral (08/28 0700) BP: 112/61 (08/28 0700)  Labs: Recent Labs    06/04/22 1355 06/04/22 1411 06/04/22 2302 06/05/22 0813 06/05/22 1823 06/06/22 0101 06/06/22 0915 06/07/22 0205  HGB   < >  --  9.6* 10.6*  --  9.5*  --  8.7*  HCT   < >  --  28.3* 31.4*  --  27.8*  --  26.4*  PLT   < >  --  200 262  --  224  --  244  APTT  --  44*  --   --   --   --   --   --   HEPARINUNFRC  --   --  0.11* 0.23* 0.19* 0.41  --  0.65  CREATININE  --   --  0.73  --   --   --  0.81 0.59   < > = values in this interval not displayed.    Estimated Creatinine Clearance: 50.1 mL/min (by C-G formula based on SCr of 0.59 mg/dL).   Assessment: 81 y.o. female with medical history significant of A.Fib on Xarelto prior to admission. Last dose reported 8/21 at 2100. Pharmacy consulted to manage heparin while Xarelto on hold.   Heparin infusion restarted 8/25 AM. Heparin level 0.65 therapeutic on hepairn 1550 units/hr. Hgb trending down 10.6 > 8.7, platelets wnl. Per discussion with RN, patient has had 5 multiple small episodes of loose black  stools since yesterday. Surgery team aware, planning to hold heparin infusion and recheck CBC this afternoon.   Goal of Therapy:  Heparin level 0.3-0.7 units/ml Monitor platelets by anticoagulation protocol: Yes   Plan:  Hold heparin infusion Continue to monitor H&H and platelets    Thank you for allowing pharmacy to be a part of this patient's care.  Ardyth Harps, PharmD Clinical Pharmacist

## 2022-06-07 NOTE — Progress Notes (Signed)
[facility] Progress Note  4 Days Post-Op  Subjective: Had 5 black liquid BMs since yesterday afternoon.  Last one just now.  NGT output down to 200cc in last 24 hours.  Abdominal pain is minimal  Objective: Vital signs in last 24 hours: Temp:  [98.4 F (36.9 C)-98.6 F (37 C)] 98.6 F (37 C) (08/28 0700) Pulse Rate:  [74-79] 79 (08/27 1515) Resp:  [13-16] 13 (08/27 1515) BP: (112-124)/(61-65) 112/61 (08/28 0700) SpO2:  [97 %] 97 % (08/27 1515) Last BM Date : 06/03/22  Intake/Output from previous day: 08/27 0701 - 08/28 0700 In: 1040.3 [I.V.:757.4; IV Piggyback:282.9] Out: 1601 [Urine:600; Emesis/NG output:200; Stool:1] Intake/Output this shift: No intake/output data recorded.  PE: Gen:  Alert, NAD Pulm: rate and effort normal Abd: Soft, minimal distension, appropriately tender mostly over incision, open midline incision with healthy granulation tissue and no cellulitis or purulent drainage/no bleeding  Lab Results:  Recent Labs    06/06/22 0101 06/07/22 0205  WBC 6.7 5.3  HGB 9.5* 8.7*  HCT 27.8* 26.4*  PLT 224 244   BMET Recent Labs    06/06/22 0915 06/07/22 0205  NA 138 139  K 2.8* 3.1*  CL 99 103  CO2 25 27  GLUCOSE 92 113*  BUN 9 6*  CREATININE 0.81 0.59  CALCIUM 7.7* 7.2*   PT/INR No results for input(s): "LABPROT", "INR" in the last 72 hours. CMP     Component Value Date/Time   NA 139 06/07/2022 0205   NA 138 04/06/2022 1429   K 3.1 (L) 06/07/2022 0205   CL 103 06/07/2022 0205   CO2 27 06/07/2022 0205   GLUCOSE 113 (H) 06/07/2022 0205   BUN 6 (L) 06/07/2022 0205   BUN 10 04/06/2022 1429   CREATININE 0.59 06/07/2022 0205   CREATININE 0.90 08/12/2016 1022   CALCIUM 7.2 (L) 06/07/2022 0205   PROT 5.1 (L) 06/07/2022 0205   PROT 6.9 11/21/2019 1035   ALBUMIN 2.3 (L) 06/07/2022 0205   ALBUMIN 4.5 11/21/2019 1035   AST 27 06/07/2022 0205   ALT 17 06/07/2022 0205   ALKPHOS 66 06/07/2022 0205   BILITOT 1.0 06/07/2022 0205    BILITOT 0.9 11/21/2019 1035   GFRNONAA >60 06/07/2022 0205   GFRAA 66 11/21/2019 1035   Lipase     Component Value Date/Time   LIPASE 44 05/31/2022 2344       Studies/Results: No results found.  Anti-infectives: Anti-infectives (From admission, onward)    Start     Dose/Rate Route Frequency Ordered Stop   06/03/22 1400  piperacillin-tazobactam (ZOSYN) IVPB 3.375 g        3.375 g 12.5 mL/hr over 240 Minutes Intravenous Every 8 hours 06/03/22 1346 06/08/22 1359   06/01/22 1200  piperacillin-tazobactam (ZOSYN) IVPB 3.375 g  Status:  Discontinued        3.375 g 12.5 mL/hr over 240 Minutes Intravenous Every 8 hours 06/01/22 0335 06/03/22 1346   06/01/22 0315  piperacillin-tazobactam (ZOSYN) IVPB 3.375 g        3.375 g 100 mL/hr over 30 Minutes Intravenous  Once 06/01/22 0305 06/01/22 0408        Assessment/Plan JEJUNAL DIVERTICULITIS with necrosis  -POD#4 EXPLORATORY LAPAROTOMY, SMALL BOWEL RESECTION 8/24 Dr. Redmond Pulling -DC NGT today as she is having minimal output and + bowel function, but just sips of clears from the floor given I think she is oozing from her SB anastomosis - IV PPI  -hgb down 2g in 2 days.  Check at 1400  today and in am given she is having black BMs -hold heparin gtt - Mobilize, PT/OT - recommending HH PT - continue tylenol for pain control - BID wet to dry dressing changes - prealbumin low, but hold on TNA as hopeful to advance diet   ID - zosyn 8/22>>8/29 (5 days postop) FEN - IVF, sips of clear liquids VTE - heparin gtt on hold Foley - d/c 8/25 and voiding   Paroxysmal A Fib - xarelto on hold, restart heparin gtt, but on hold due to likely bleeding from anastomosis. Cards has seen, high risk for A fib postop and they have given medication recommendations History of constrictive pericarditis s/p pericardial stripping in 2006 HTN Recent Covid-19 infection (diagnosed 05/19/22)    LOS: 5 days    Yolanda Bell, [facility]  Surgery 06/07/2022, 10:17 AM Please see Amion for pager number during day hours 7:00am-4:30pm

## 2022-06-07 NOTE — Plan of Care (Signed)

## 2022-06-08 DIAGNOSIS — K5712 Diverticulitis of small intestine without perforation or abscess without bleeding: Secondary | ICD-10-CM | POA: Diagnosis not present

## 2022-06-08 LAB — CBC WITH DIFFERENTIAL/PLATELET
Abs Immature Granulocytes: 0.31 10*3/uL — ABNORMAL HIGH (ref 0.00–0.07)
Basophils Absolute: 0 10*3/uL (ref 0.0–0.1)
Basophils Relative: 1 %
Eosinophils Absolute: 0.1 10*3/uL (ref 0.0–0.5)
Eosinophils Relative: 1 %
HCT: 25.5 % — ABNORMAL LOW (ref 36.0–46.0)
Hemoglobin: 8.5 g/dL — ABNORMAL LOW (ref 12.0–15.0)
Immature Granulocytes: 5 %
Lymphocytes Relative: 24 %
Lymphs Abs: 1.6 10*3/uL (ref 0.7–4.0)
MCH: 30.1 pg (ref 26.0–34.0)
MCHC: 33.3 g/dL (ref 30.0–36.0)
MCV: 90.4 fL (ref 80.0–100.0)
Monocytes Absolute: 0.3 10*3/uL (ref 0.1–1.0)
Monocytes Relative: 5 %
Neutro Abs: 4.3 10*3/uL (ref 1.7–7.7)
Neutrophils Relative %: 64 %
Platelets: 265 10*3/uL (ref 150–400)
RBC: 2.82 MIL/uL — ABNORMAL LOW (ref 3.87–5.11)
RDW: 13.6 % (ref 11.5–15.5)
WBC: 6.7 10*3/uL (ref 4.0–10.5)
nRBC: 0.3 % — ABNORMAL HIGH (ref 0.0–0.2)

## 2022-06-08 LAB — COMPREHENSIVE METABOLIC PANEL
ALT: 20 U/L (ref 0–44)
AST: 30 U/L (ref 15–41)
Albumin: 2.4 g/dL — ABNORMAL LOW (ref 3.5–5.0)
Alkaline Phosphatase: 67 U/L (ref 38–126)
Anion gap: 7 (ref 5–15)
BUN: <5 mg/dL — ABNORMAL LOW (ref 8–23)
CO2: 25 mmol/L (ref 22–32)
Calcium: 7.4 mg/dL — ABNORMAL LOW (ref 8.9–10.3)
Chloride: 104 mmol/L (ref 98–111)
Creatinine, Ser: 0.58 mg/dL (ref 0.44–1.00)
GFR, Estimated: >60 mL/min (ref >60)
Glucose, Bld: 109 mg/dL — ABNORMAL HIGH (ref 70–99)
Potassium: 3.9 mmol/L (ref 3.5–5.1)
Sodium: 136 mmol/L (ref 135–145)
Total Bilirubin: 0.6 mg/dL (ref 0.3–1.2)
Total Protein: 5.3 g/dL — ABNORMAL LOW (ref 6.5–8.1)

## 2022-06-08 LAB — HEPARIN LEVEL (UNFRACTIONATED): Heparin Unfractionated: <0.1 IU/mL — ABNORMAL LOW (ref 0.30–0.70)

## 2022-06-08 LAB — MAGNESIUM: Magnesium: 1.8 mg/dL (ref 1.7–2.4)

## 2022-06-08 LAB — PHOSPHORUS: Phosphorus: 1.5 mg/dL — ABNORMAL LOW (ref 2.5–4.6)

## 2022-06-08 MED ORDER — METOPROLOL SUCCINATE ER 25 MG PO TB24
25.0000 mg | ORAL_TABLET | Freq: Every day | ORAL | Status: DC
  Administered 2022-06-08 – 2022-06-09 (×2): 25 mg via ORAL
  Filled 2022-06-08 (×2): qty 1

## 2022-06-08 MED ORDER — POTASSIUM PHOSPHATES 15 MMOLE/5ML IV SOLN
30.0000 mmol | Freq: Once | INTRAVENOUS | Status: AC
  Administered 2022-06-08: 30 mmol via INTRAVENOUS
  Filled 2022-06-08: qty 10

## 2022-06-08 MED ORDER — ACETAMINOPHEN 325 MG PO TABS: 650.0000 mg | ORAL_TABLET | Freq: Four times a day (QID) | ORAL | Status: DC | PRN

## 2022-06-08 NOTE — Progress Notes (Signed)
Occupational Therapy Treatment Patient Details Name: Yolanda Bell MRN: [id number] DOB: 22-Feb-1941 Today's Date: 06/08/2022   History of present illness 81 yo admitted 05/31/22  who presented with diffuse abdominal pain, nausea, and vomiting as well as urinary urgency/hesitation was found to have jejunal diverticulitis.  8/29 imaging concerning for renal cell carcinoma.  Pt underwent small bowel resection. PMH:  atrial fibrillation on xarelto, HTN, recent covid 19 infection   OT comments  Pt progressing toward goals this session. Pt demonstrates bathroom transfer and min (A) for pathfinding transfer into the hallway. Pt emotional initially on arrival and by the end of the session was joking in response to therapist. Pt reports feeling better after exiting the room.    Recommendations for follow up therapy are one component of a multi-disciplinary discharge planning process, led by the attending physician.  Recommendations may be updated based on patient status, additional functional criteria and insurance authorization.    Follow Up Recommendations  No OT follow up    Assistance Recommended at Discharge None  Patient can return home with the following  Assist for transportation   Equipment Recommendations  BSC/3in1    Recommendations for Other Services Rehab consult    Precautions / Restrictions Precautions Precautions: Other (comment)       Mobility Bed Mobility               General bed mobility comments: oob on arrival    Transfers Overall transfer level: Needs assistance   Transfers: Sit to/from Stand Sit to Stand: Supervision           General transfer comment: using cane with transfers     Balance                                           ADL either performed or assessed with clinical judgement   ADL Overall ADL's : Needs assistance/impaired   Eating/Feeding Details (indicate cue type and reason): drinking from cup now Grooming:  Wash/dry hands;Set up;Sitting                   Toilet Transfer: Min guard;Ambulation;Regular Museum/gallery exhibitions officer and Hygiene: Min guard;Sit to/from stand       Functional mobility during ADLs: Min guard;Cane General ADL Comments: pt with improved affect after moving in the hall and existing the room. pt with increased rapport after meeting pts needs / concerns for IV pulling/ tape on arm. pt very concerned because bandage on arm was firm and it was actually a pressue dressing from an IV that had been dced. once the dressing was removed and pt saw that skin was normal and it was the thickeness of the bandage that was firm she was very relieved.    Extremity/Trunk Assessment Upper Extremity Assessment Upper Extremity Assessment: Overall WFL for tasks assessed   Lower Extremity Assessment Lower Extremity Assessment: Overall WFL for tasks assessed        Vision       Perception     Praxis      Cognition Arousal/Alertness: Awake/alert Behavior During Therapy:  (tearful) Overall Cognitive Status: Impaired/Different from baseline Area of Impairment: Memory                     Memory: Decreased short-term memory         General Comments: pt very emotional on  arrival and reports that nothing has gone right today. pt at the end of session much more cheerful and thanking therapist smiling "thank you for pushing me it helped"        Exercises      Shoulder Instructions       General Comments VSS    Pertinent Vitals/ Pain       Pain Assessment Pain Assessment: No/denies pain  Home Living                                          Prior Functioning/Environment              Frequency  Min 2X/week        Progress Toward Goals  OT Goals(current goals can now be found in the care plan section)  Progress towards OT goals: Progressing toward goals  Acute Rehab OT Goals Patient Stated Goal: to be able to  get real food on time OT Goal Formulation: With patient Time For Goal Achievement: 06/18/22 Potential to Achieve Goals: Good ADL Goals Pt Will Perform Tub/Shower Transfer: with modified independence;ambulating Additional ADL Goal #1: pt will demonstrate 2 energy conservation strategies with adls.  Plan Discharge plan remains appropriate    Co-evaluation                 AM-PAC OT "6 Clicks" Daily Activity     Outcome Measure   Help from another person eating meals?: None Help from another person taking care of personal grooming?: None Help from another person toileting, which includes using toliet, bedpan, or urinal?: A Little Help from another person bathing (including washing, rinsing, drying)?: A Little Help from another person to put on and taking off regular upper body clothing?: None Help from another person to put on and taking off regular lower body clothing?: None 6 Click Score: 22    End of Session    OT Visit Diagnosis: Unsteadiness on feet (R26.81)   Activity Tolerance Patient tolerated treatment well   Patient Left in chair;with call bell/phone within reach   Nurse Communication Mobility status;Precautions        Time: 1405 ([phone] OT Time Calculation (min): 43 min  Charges: OT General Charges $OT Visit: 1 Visit OT Treatments $Self Care/Home Management : 38-52 mins   Brynn, OTR/L  Acute Rehabilitation Services Office: [phone] .   Jeri Modena 06/08/2022, 3:19 PM

## 2022-06-08 NOTE — Progress Notes (Signed)
PROGRESS NOTE    Yolanda Bell  BLT:[id number] DOB: October 15, 1940 DOA: 05/31/2022 PCP: Faustino Congress, NP  Chief Complaint  Patient presents with   Abdominal Pain    Brief Narrative:  81 yo with hx atrial fibrillation on xarelto, HTN, recent covid 19 infection who presented with diffuse abdominal pain, nausea, and vomiting as well as urinary urgency/hesitation.  CT scan showed Admir Candelas giant small bowel diverticulum with acute diverticulitis.  Now s/p small bowel resection.  Diet being advanced as tolerated per surgery.  Heparin currently on hold with suspicion of oozing from small bowel anastomosis.  See below for additional details    Assessment & Plan:   Principal Problem:   Diverticulitis of small bowel Active Problems:   GI bleeding   Diverticulitis large intestine   Urinary urgency   Hypertension   PAF (paroxysmal atrial fibrillation) (HCC)   Hypophosphatemia   Nodule of kidney   Hypokalemia   Compression fracture of lumbar vertebra (HCC)   History of COVID-19   History of liver disease   Diverticulitis   Assessment and Plan: * Diverticulitis of small bowel Giant small bowel diverticulum, (apparently of jejunum) with acute diverticulitis.  Diverticulum measures 5.3 x 7.3 x 6.2 cm up from 2.5cm in 2022. Also mild diverticulitis of sigmoid. Inflammation about bladder likely causing sensation of urinary urgency. adat per surgery Morphine PRN pain Switching Xarelto to heparin gtt - currently on hold Appreciate general surgery recs ->now s/p small bowel resection and ex lap on 8/24 -> d/c NGT today, concern for oozing from SB anastomosis, holding heparin for now, per surgery  GI bleeding Suspected oozing from small bowel anastomoses Trend Hb/Hct Transfuse for <7 or symptomatic Per surgery Heparin on hold  Diverticulitis large intestine abx Appreciate general surgery recs  Urinary urgency Inflammation about bladder from diverticulitis likely to be causing todays  sensation of urinary urgency / retention (as noted on CT scan).  Pt doesn't appear to actually be retaining from bladder scans. UA not c/w UTI    Hypertension Cont BB Hold diuretics.  Hypophosphatemia Replace and follow  PAF (paroxysmal atrial fibrillation) (HCC) Cont BB. Hold Xarelto, Heparin on hold for GI bleeding On scheduled IV metoprolol for now -> she's on CLD, will restart metoprolol Seen by cardiology, note can hold anticoagulation as needed, continue beta blocker, high risk for post surgical atrial fib  Hypokalemia Replace and follow  Check mag  Nodule of kidney Concerning for renal cell carcinoma Needs additional imaging  Discussed with pt  Compression fracture of lumbar vertebra (HCC) Compression Fx of L5, while severe, is old, stable on imaging and chronic (was chronic even on 06/2021 CT).  History of COVID-19 August 9th symptoms started, tested positive 8/10 8/17 she reports home test was negative Has some residual L ear pain, otherwise sx improved  History of liver disease Unclear hx surrounding this Related to steroids/volume? unclear Would recommend follow up outpatient     DVT prophylaxis: heparin gtt on hold, SCD Code Status: full Family Communication: none  Disposition:   Status is: Observation The patient will require care spanning > 2 midnights and should be moved to inpatient because: need for abx/surgery   Consultants:  Surgery cardiology  Procedures:  none  Antimicrobials:  Anti-infectives (From admission, onward)    Start     Dose/Rate Route Frequency Ordered Stop   06/03/22 1400  piperacillin-tazobactam (ZOSYN) IVPB 3.375 g        3.375 g 12.5 mL/hr over 240 Minutes Intravenous Every 8  hours 06/03/22 1346 06/08/22 1009   06/01/22 1200  piperacillin-tazobactam (ZOSYN) IVPB 3.375 g  Status:  Discontinued        3.375 g 12.5 mL/hr over 240 Minutes Intravenous Every 8 hours 06/01/22 0335 06/03/22 1346   06/01/22 0315   piperacillin-tazobactam (ZOSYN) IVPB 3.375 g        3.375 g 100 mL/hr over 30 Minutes Intravenous  Once 06/01/22 0305 06/01/22 0408       Subjective: No new complaints Yolanda Bell little anxious about our RCC discussion yesterday as well as outpatient surgical plans upcoming  Objective: Vitals:   06/07/22 1944 06/08/22 0405 06/08/22 0953 06/08/22 1407  BP: (!) 132/59 (!) 113/59 (!) 141/66 128/66  Pulse: 95 85 81 (!) 107  Resp: '12 14 18 18  '$ Temp: 98.4 F (36.9 C) 98 F (36.7 C)  98.1 F (36.7 C)  TempSrc: Oral   Oral  SpO2: 98% 97% 99% 98%  Weight:      Height:        Intake/Output Summary (Last 24 hours) at 06/08/2022 1931 Last data filed at 06/08/2022 3818 Gross per 24 hour  Intake 0 ml  Output --  Net 0 ml    Filed Weights   05/31/22 2332 06/03/22 0939  Weight: 63 kg 63 kg    Examination:  General: No acute distress. Sitting up in chair Cardiovascular: RRR Lungs: unlabored Abdomen: Soft, nontender, nondistended - midline dressing Neurological: Alert and oriented 3. Moves all extremities 4 with equal strength. Cranial nerves II through XII grossly intact. Extremities: No clubbing or cyanosis. No edema.  Data Reviewed: I have personally reviewed following labs and imaging studies  CBC: Recent Labs  Lab 06/03/22 0225 06/04/22 0119 06/04/22 2302 06/05/22 0813 06/06/22 0101 06/07/22 0205 06/07/22 1343 06/07/22 2022 06/08/22 0130  WBC 14.6* 9.2   < > 9.0 6.7 5.3 5.9  --  6.7  NEUTROABS 13.3* 8.2*  --   --   --  3.9  --   --  4.3  HGB 11.0* 9.7*   < > 10.6* 9.5* 8.7* 7.9* 8.6* 8.5*  HCT 31.4* 28.3*   < > 31.4* 27.8* 26.4* 23.7* 25.6* 25.5*  MCV 89.0 89.3   < > 90.2 88.5 90.7 89.1  --  90.4  PLT 188 161   < > 262 224 244 218  --  265   < > = values in this interval not displayed.    Basic Metabolic Panel: Recent Labs  Lab 06/03/22 0225 06/04/22 0119 06/04/22 2302 06/06/22 0915 06/07/22 0205 06/08/22 0130  NA 135 137 139 138 139 136  K 3.4* 3.5 3.6  2.8* 3.1* 3.9  CL 102 106 106 99 103 104  CO2 '24 22 26 25 27 25  '$ GLUCOSE 104* 97 89 92 113* 109*  BUN '10 10 10 9 '$ 6* <5*  CREATININE 0.85 0.71 0.73 0.81 0.59 0.58  CALCIUM 8.3* 7.4* 7.5* 7.7* 7.2* 7.4*  MG 1.9 1.8  --  2.0 1.9 1.8  PHOS 2.0* 3.0  --   --  1.3* 1.5*    GFR: Estimated Creatinine Clearance: 50.1 mL/min (by C-G formula based on SCr of 0.58 mg/dL).  Liver Function Tests: Recent Labs  Lab 06/03/22 0225 06/04/22 0119 06/06/22 0915 06/07/22 0205 06/08/22 0130  AST '22 19 30 27 30  '$ ALT '17 14 20 17 20  '$ ALKPHOS 60 47 87 66 67  BILITOT 2.3* 1.4* 1.4* 1.0 0.6  PROT 6.1* 4.6* 6.0* 5.1* 5.3*  ALBUMIN 3.0* 2.0* 2.7*  2.3* 2.4*    CBG: No results for input(s): "GLUCAP" in the last 168 hours.   Recent Results (from the past 240 hour(s))  Surgical PCR screen     Status: None   Collection Time: 06/03/22  2:52 AM   Specimen: Nasal Mucosa; Nasal Swab  Result Value Ref Range Status   MRSA, PCR NEGATIVE NEGATIVE Final   Staphylococcus aureus NEGATIVE NEGATIVE Final    Comment: (NOTE) The Xpert SA Assay (FDA approved for NASAL specimens in patients 69 years of age and older), is one component of Kenyette Gundy comprehensive surveillance program. It is not intended to diagnose infection nor to guide or monitor treatment. Performed at [facility], [place] [address] [place], [place] [address]          Radiology Studies: No results found.      Scheduled Meds:  metoprolol succinate  25 mg Oral QHS   pantoprazole (PROTONIX) IV  40 mg Intravenous Q24H   Continuous Infusions:  dextrose 5 % with KCl 20 mEq / L 75 mL/hr at 06/07/22 1851   methocarbamol (ROBAXIN) IV       LOS: 6 days    Time spent: over 30 min    Fayrene Helper, MD [facility]   To contact the attending provider between 7A-7P or the covering provider during after hours 7P-7A, please log into the web site [email or web address] and access using universal [place] password for that web site. If you  do not have the password, please call the hospital operator.  06/08/2022, 7:31 PM

## 2022-06-08 NOTE — Progress Notes (Signed)
[facility] Progress Note  5 Days Post-Op  Subjective: CC-  Denies abdominal pain, n/v. Tolerating clear liquids. Passing flatus. Has had two dark stools this morning. No longer burping.  Objective: Vital signs in last 24 hours: Temp:  [98 F (36.7 C)-98.4 F (36.9 C)] 98 F (36.7 C) (08/29 0405) Pulse Rate:  [81-100] 81 (08/29 0953) Resp:  [12-19] 18 (08/29 0953) BP: (112-141)/(59-68) 141/66 (08/29 0953) SpO2:  [93 %-99 %] 99 % (08/29 0953) Last BM Date : 06/03/22  Intake/Output from previous day: 08/28 0701 - 08/29 0700 In: 1460.4 [I.V.:1045; IV Piggyback:415.4] Out: -  Intake/Output this shift: No intake/output data recorded.  PE: Gen:  Alert, NAD Pulm: rate and effort normal Abd: Soft, minimal distension, nontender, open midline incision with healthy granulation tissue and no cellulitis or purulent drainage/no bleeding  Lab Results:  Recent Labs    06/07/22 1343 06/07/22 2022 06/08/22 0130  WBC 5.9  --  6.7  HGB 7.9* 8.6* 8.5*  HCT 23.7* 25.6* 25.5*  PLT 218  --  265   BMET Recent Labs    06/07/22 0205 06/08/22 0130  NA 139 136  K 3.1* 3.9  CL 103 104  CO2 27 25  GLUCOSE 113* 109*  BUN 6* <5*  CREATININE 0.59 0.58  CALCIUM 7.2* 7.4*   PT/INR No results for input(s): "LABPROT", "INR" in the last 72 hours. CMP     Component Value Date/Time   NA 136 06/08/2022 0130   NA 138 04/06/2022 1429   K 3.9 06/08/2022 0130   CL 104 06/08/2022 0130   CO2 25 06/08/2022 0130   GLUCOSE 109 (H) 06/08/2022 0130   BUN <5 (L) 06/08/2022 0130   BUN 10 04/06/2022 1429   CREATININE 0.58 06/08/2022 0130   CREATININE 0.90 08/12/2016 1022   CALCIUM 7.4 (L) 06/08/2022 0130   PROT 5.3 (L) 06/08/2022 0130   PROT 6.9 11/21/2019 1035   ALBUMIN 2.4 (L) 06/08/2022 0130   ALBUMIN 4.5 11/21/2019 1035   AST 30 06/08/2022 0130   ALT 20 06/08/2022 0130   ALKPHOS 67 06/08/2022 0130   BILITOT 0.6 06/08/2022 0130   BILITOT 0.9 11/21/2019 1035   GFRNONAA >60  06/08/2022 0130   GFRAA 66 11/21/2019 1035   Lipase     Component Value Date/Time   LIPASE 44 05/31/2022 2344       Studies/Results: No results found.  Anti-infectives: Anti-infectives (From admission, onward)    Start     Dose/Rate Route Frequency Ordered Stop   06/03/22 1400  piperacillin-tazobactam (ZOSYN) IVPB 3.375 g        3.375 g 12.5 mL/hr over 240 Minutes Intravenous Every 8 hours 06/03/22 1346 06/08/22 1359   06/01/22 1200  piperacillin-tazobactam (ZOSYN) IVPB 3.375 g  Status:  Discontinued        3.375 g 12.5 mL/hr over 240 Minutes Intravenous Every 8 hours 06/01/22 0335 06/03/22 1346   06/01/22 0315  piperacillin-tazobactam (ZOSYN) IVPB 3.375 g        3.375 g 100 mL/hr over 30 Minutes Intravenous  Once 06/01/22 0305 06/01/22 0408        Assessment/Plan JEJUNAL DIVERTICULITIS with necrosis  -POD#4 EXPLORATORY LAPAROTOMY, SMALL BOWEL RESECTION 8/24 Dr. Redmond Pulling -Advance to clear liquid tray -Continues to have some dark stools, likely oozing from her SB anastomosis. Continue to hold heparin gtt - discussed with primary team - IV PPI  -hgb 8.5 from 8.6, stable - Mobilize, PT/OT - recommending HH PT - Daily wet to dry dressing  changes - prealbumin low, but hold on TNA as hopeful to advance diet   ID - zosyn 8/22>>8/29 (5 days postop) FEN - IVF, CLD VTE - heparin gtt on hold Foley - d/c 8/25 and voiding   Paroxysmal A Fib - xarelto on hold, heparin gtt on hold due to likely bleeding from anastomosis. Cards has seen, high risk for A fib postop and they have given medication recommendations History of constrictive pericarditis s/p pericardial stripping in 2006 HTN Recent Covid-19 infection (diagnosed 05/19/22)    LOS: 6 days    Yolanda Bell, [facility] 06/08/2022, 10:00 AM Please see Amion for pager number during day hours 7:00am-4:30pm

## 2022-06-08 NOTE — Plan of Care (Signed)

## 2022-06-08 NOTE — Progress Notes (Signed)
Physical Therapy Treatment Patient Details Name: Yolanda Bell MRN: [id number] DOB: [birth year] Today's Date: 06/08/2022   History of Present Illness 81 yo admitted 05/31/22  who presented with diffuse abdominal pain, nausea, and vomiting as well as urinary urgency/hesitation was found to have jejunal diverticulitis.  New kidney CA dx.  Pt underwent small bowel resection. PMH:  atrial fibrillation on xarelto, HTN, recent covid 19 infection    PT Comments    Pt was seen for progression of mobility on hurry cane, stopping briefly to speak to a friend.  Her effort and awareness of her condition are good, with pt thinking ahead to the next task in her mobility.  Will encourage OOB in chair as much as she tolerates, and will work toward better control of LE strength to make the cane a fair device for home with R knee tendency.  Follow acutely for goals of PT as are outlined on POC.  Recommendations for follow up therapy are one component of a multi-disciplinary discharge planning process, led by the attending physician.  Recommendations may be updated based on patient status, additional functional criteria and insurance authorization.  Follow Up Recommendations  Home health PT     Assistance Recommended at Discharge PRN  Patient can return home with the following A little help with walking and/or transfers;Assistance with cooking/housework;Assist for transportation;Help with stairs or ramp for entrance   Equipment Recommendations  None recommended by PT    Recommendations for Other Services       Precautions / Restrictions Precautions Precautions: Other (comment) Precaution Comments: new kidney CA dx Restrictions Weight Bearing Restrictions: No     Mobility  Bed Mobility Overal bed mobility: Needs Assistance Bed Mobility: Sidelying to Sit   Sidelying to sit: Min guard       General bed mobility comments: min guard with rails and support    Transfers Overall transfer level:  Needs assistance Equipment used: None Transfers: Sit to/from Stand Sit to Stand: Supervision                Ambulation/Gait Ambulation/Gait assistance: Min guard Gait Distance (Feet): 150 Feet Assistive device: Straight cane (hurry cane) Gait Pattern/deviations: Decreased stride length, Decreased weight shift to right Gait velocity: reduced Gait velocity interpretation: <1.31 ft/sec, indicative of household ambulator Pre-gait activities: managing balance and discussion of limits General Gait Details: minor fatigue on RLE   Stairs             Wheelchair Mobility    Modified Rankin (Stroke Patients Only)       Balance Overall balance assessment: Needs assistance Sitting-balance support: Feet supported Sitting balance-Leahy Scale: Good     Standing balance support: Single extremity supported Standing balance-Leahy Scale: Fair                              Cognition Arousal/Alertness: Awake/alert Behavior During Therapy: WFL for tasks assessed/performed Overall Cognitive Status: Within Functional Limits for tasks assessed                                          Exercises General Exercises - Lower Extremity Ankle Circles/Pumps: AROM, 5 reps Quad Sets: AROM, 10 reps Long Arc Quad: AROM, Both, 10 reps Heel Slides: AROM, Both, 10 reps    General Comments        Pertinent Vitals/Pain Pain Assessment  Pain Assessment: No/denies pain    Home Living                          Prior Function            PT Goals (current goals can now be found in the care plan section) Acute Rehab PT Goals Patient Stated Goal: to go home Progress towards PT goals: Progressing toward goals    Frequency    Min 3X/week      PT Plan Current plan remains appropriate    Co-evaluation              AM-PAC PT "6 Clicks" Mobility   Outcome Measure  Help needed turning from your back to your side while in a flat bed  without using bedrails?: A Little Help needed moving from lying on your back to sitting on the side of a flat bed without using bedrails?: A Little Help needed moving to and from a bed to a chair (including a wheelchair)?: A Little Help needed standing up from a chair using your arms (e.g., wheelchair or bedside chair)?: A Little Help needed to walk in hospital room?: A Little Help needed climbing 3-5 steps with a railing? : A Little 6 Click Score: 18    End of Session Equipment Utilized During Treatment: Gait belt Activity Tolerance: Patient limited by fatigue;Other (comment) (R knee mild weakness) Patient left: in chair;with call bell/phone within reach;with chair alarm set Nurse Communication: Mobility status PT Visit Diagnosis: Muscle weakness (generalized) (M62.81);Difficulty in walking, not elsewhere classified (R26.2)     Time: 5053-9767 PT Time Calculation (min) (ACUTE ONLY): 30 min  Charges:  $Gait Training: 8-22 mins $Therapeutic Exercise: 8-22 mins     Ramond Dial 06/08/2022, 11:43 AM  Mee Hives, PT PhD Acute Rehab Dept. Number: [place]

## 2022-06-08 NOTE — Plan of Care (Signed)

## 2022-06-09 DIAGNOSIS — K5712 Diverticulitis of small intestine without perforation or abscess without bleeding: Secondary | ICD-10-CM | POA: Diagnosis not present

## 2022-06-09 LAB — CBC
HCT: 23.6 % — ABNORMAL LOW (ref 36.0–46.0)
Hemoglobin: 7.8 g/dL — ABNORMAL LOW (ref 12.0–15.0)
MCH: 30.2 pg (ref 26.0–34.0)
MCHC: 33.1 g/dL (ref 30.0–36.0)
MCV: 91.5 fL (ref 80.0–100.0)
Platelets: 224 10*3/uL (ref 150–400)
RBC: 2.58 MIL/uL — ABNORMAL LOW (ref 3.87–5.11)
RDW: 14.1 % (ref 11.5–15.5)
WBC: 4.6 10*3/uL (ref 4.0–10.5)
nRBC: 0 % (ref 0.0–0.2)

## 2022-06-09 MED ORDER — BOOST / RESOURCE BREEZE PO LIQD CUSTOM
1.0000 | Freq: Three times a day (TID) | ORAL | Status: DC
  Administered 2022-06-11: 1 via ORAL

## 2022-06-09 MED ORDER — PROPRANOLOL HCL 10 MG PO TABS: 10.0000 mg | ORAL_TABLET | Freq: Four times a day (QID) | ORAL | Status: DC | PRN

## 2022-06-09 MED ORDER — OXYCODONE HCL 5 MG PO TABS: 2.5000 mg | ORAL_TABLET | ORAL | Status: DC | PRN

## 2022-06-09 MED ORDER — MORPHINE SULFATE (PF) 2 MG/ML IV SOLN: 1.0000 mg | INTRAVENOUS | Status: DC | PRN

## 2022-06-09 NOTE — Progress Notes (Signed)
Occupational Therapy Treatment Patient Details Name: Yolanda Bell MRN: [id number] DOB: [birth year] Today's Date: 06/09/2022   History of present illness 81 yo admitted 05/31/22  who presented with diffuse abdominal pain, nausea, and vomiting as well as urinary urgency/hesitation was found to have jejunal diverticulitis.  8/29 imaging concerning for renal cell carcinoma.  Pt underwent small bowel resection. PMH:  atrial fibrillation on xarelto, HTN, recent covid 19 infection   OT comments  Pt progressing towards acute OT goals. Focus of session was building OOB activity tolerance towards ADL management goals. Pt verbalizing feeling overwhelmed by current situation, provided therapeutic listening and offered chaplain services, pt reports she already has a pastor checking in on her. Pt up in recliner at end of session.    Recommendations for follow up therapy are one component of a multi-disciplinary discharge planning process, led by the attending physician.  Recommendations may be updated based on patient status, additional functional criteria and insurance authorization.    Follow Up Recommendations  No OT follow up    Assistance Recommended at Discharge None  Patient can return home with the following  Assist for transportation   Equipment Recommendations  BSC/3in1    Recommendations for Other Services      Precautions / Restrictions Precautions Precautions: Other (comment) Precaution Comments: new kidney CA dx Restrictions Weight Bearing Restrictions: No       Mobility Bed Mobility Overal bed mobility: Needs Assistance Bed Mobility: Sidelying to Sit Rolling: Supervision Sidelying to sit: Min guard            Transfers Overall transfer level: Needs assistance Equipment used: Straight cane Transfers: Sit to/from Stand             General transfer comment: using cane with transfers     Balance Overall balance assessment: Needs assistance Sitting-balance  support: Feet supported Sitting balance-Leahy Scale: Good     Standing balance support: Single extremity supported Standing balance-Leahy Scale: Fair                             ADL either performed or assessed with clinical judgement   ADL Overall ADL's : Needs assistance/impaired                         Toilet Transfer: Min guard;Ambulation;Regular Glass blower/designer Details (indicate cue type and reason): simulated with up to chair         Functional mobility during ADLs: Min guard;Cane General ADL Comments: Pt appears overwhelmed by current situation. reports having a hard time remembering what medical team says to her "I just zone out." Discussed setting up patient portal and also provided paper and pen for taking notes. Offered Chaplain visit but pt declined at this time citing that she already receiving pastoral services.    Extremity/Trunk Assessment Upper Extremity Assessment Upper Extremity Assessment: Overall WFL for tasks assessed   Lower Extremity Assessment Lower Extremity Assessment: Defer to PT evaluation        Vision       Perception     Praxis      Cognition Arousal/Alertness: Awake/alert Behavior During Therapy: WFL for tasks assessed/performed, Anxious (tearful at times) Overall Cognitive Status: Impaired/Different from baseline Area of Impairment: Memory                     Memory: Decreased short-term memory  Exercises      Shoulder Instructions       General Comments VSS    Pertinent Vitals/ Pain       Pain Assessment Pain Assessment: No/denies pain  Home Living                                          Prior Functioning/Environment              Frequency  Min 2X/week        Progress Toward Goals  OT Goals(current goals can now be found in the care plan section)  Progress towards OT goals: Progressing toward goals  Acute Rehab OT  Goals Patient Stated Goal: to be able to get real food on time OT Goal Formulation: With patient Time For Goal Achievement: 06/18/22 Potential to Achieve Goals: Good ADL Goals Pt Will Perform Tub/Shower Transfer: with modified independence;ambulating Additional ADL Goal #1: pt will demonstrate 2 energy conservation strategies with adls.  Plan Discharge plan remains appropriate    Co-evaluation                 AM-PAC OT "6 Clicks" Daily Activity     Outcome Measure   Help from another person eating meals?: None Help from another person taking care of personal grooming?: None Help from another person toileting, which includes using toliet, bedpan, or urinal?: A Little Help from another person bathing (including washing, rinsing, drying)?: A Little Help from another person to put on and taking off regular upper body clothing?: None Help from another person to put on and taking off regular lower body clothing?: None 6 Click Score: 22    End of Session Equipment Utilized During Treatment: Other (comment) (cane)  OT Visit Diagnosis: Unsteadiness on feet (R26.81)   Activity Tolerance Patient tolerated treatment well   Patient Left in chair;with call bell/phone within reach   Nurse Communication Mobility status;Precautions        Time: 6283-6629 OT Time Calculation (min): 32 min  Charges: OT General Charges $OT Visit: 1 Visit OT Treatments $Self Care/Home Management : 23-37 mins  Tyrone Schimke, OT Acute Rehabilitation Services Office: [phone]   Hortencia Pilar 06/09/2022, 12:13 PM

## 2022-06-09 NOTE — Progress Notes (Signed)
PROGRESS NOTE  Yolanda Bell  FFM:[id number] DOB: January 22, 1941 DOA: 05/31/2022 PCP: Faustino Congress, NP  Chief Complaint  Patient presents with   Abdominal Pain    Brief Narrative:  81 yo with hx atrial fibrillation on xarelto, HTN, recent covid 19 infection who presented with diffuse abdominal pain, nausea, and vomiting as well as urinary urgency/hesitation.  CT scan showed a giant small bowel diverticulum with acute diverticulitis-underwent small bowel resection ex lap 8/24 Diet being advanced as tolerated per surgery.  Heparin currently on hold with suspicion of oozing from small bowel anastomosis.  See below   Assessment & Plan:   Principal Problem:   Diverticulitis of small bowel Active Problems:   GI bleeding   Diverticulitis large intestine   Urinary urgency   Hypertension   PAF (paroxysmal atrial fibrillation) (HCC)   Hypophosphatemia   Nodule of kidney   Hypokalemia   Compression fracture of lumbar vertebra (HCC)   History of COVID-19   History of liver disease   Diverticulitis   Assessment and Plan: * Diverticulitis of small bowel Giant small bowel diverticulum,--5.3 x 7.3 x 6.2 cm up from 2.5cm in 2022. heparin gtt - currently on hold Appreciate general surgery recs ->now s/p small bowel resection and ex lap on 8/24 -> d/c NGT today, concern for oozing from SB anastomosis, holding heparin for now, per surgery  GI bleeding Suspected oozing from small bowel anastomoses Trend Hb/Hct Transfuse for <7 or symptomatic  Diverticulitis large intestine abx Appreciate general surgery recs  Urinary urgency Inflammation about bladder from diverticulitis  Overall seems resolved  Hypertension Cont BB-Toprol-XL resumed Hold diuretics at this time  Hypophosphatemia Replace and follow  PAF (paroxysmal atrial fibrillation) ([place]) Cont BB. Hold Xarelto, Heparin on hold for GI bleeding Was on apparently may be propanolol-can resume that for heart rate above  120 Seen by cardiology 8/22, can hold anticoagulation as needed, continue beta blocker, high risk for post surgical atrial fib  Hypokalemia Replaced--- stop replacement at this time and recheck in a.m.  Nodule of kidney Concerning for renal cell carcinoma--Long discussion at bedside about outpatient follow-up and need for imaging  Compression fracture of lumbar vertebra (HCC) Compression Fx of L5, while severe, is old, stable on imaging and chronic (was chronic even on 06/2021 CT).  History of COVID-19 August 9th symptoms started, tested positive 8/10 8/17 she reports home test was negative Has some residual L ear pain, otherwise sx improved  History of liver disease Unclear hx surrounding this Related to steroids/volume?  She has several questions about her diuretics we will resume Aldactone 50 torsemide 20 with as needed's in the outpatient setting  Spasmodic dysphonia, tremor      DVT prophylaxis: heparin gtt on hold, SCD Code Status: full Family Communication: none  Disposition:   Status is: Observation The patient will require care spanning > 2 midnights and should be moved to inpatient because: need for abx/surgery   Consultants:  Surgery cardiology  Procedures:  none  Antimicrobials:  Anti-infectives (From admission, onward)    Start     Dose/Rate Route Frequency Ordered Stop   06/03/22 1400  piperacillin-tazobactam (ZOSYN) IVPB 3.375 g        3.375 g 12.5 mL/hr over 240 Minutes Intravenous Every 8 hours 06/03/22 1346 06/08/22 1009   06/01/22 1200  piperacillin-tazobactam (ZOSYN) IVPB 3.375 g  Status:  Discontinued        3.375 g 12.5 mL/hr over 240 Minutes Intravenous Every 8 hours 06/01/22 0335 06/03/22 1346  06/01/22 0315  piperacillin-tazobactam (ZOSYN) IVPB 3.375 g        3.375 g 100 mL/hr over 30 Minutes Intravenous  Once 06/01/22 0305 06/01/22 0408       Subjective:  Somewhat anxious many questions which were all answered as best as I  could No pain-was ambulatory Has been advanced to full liquids   Objective: Vitals:   06/08/22 0953 06/08/22 1407 06/08/22 1945 06/09/22 0755  BP: (!) 141/66 128/66 (!) 110/51 103/65  Pulse: 81 (!) 107 77   Resp: '18 18 17   '$ Temp:  98.1 F (36.7 C) 98.5 F (36.9 C) 98.2 F (36.8 C)  TempSrc:  Oral Oral Oral  SpO2: 99% 98%    Weight:      Height:       No intake or output data in the 24 hours ending 06/09/22 1431   Filed Weights   05/31/22 2332 06/03/22 0939  Weight: 63 kg 63 kg    Examination:  Awake coherent no distress EOMI NCAT no focal deficit S1-S2 no murmur Chest is clear She does have midline scar which is open and packed with thoracic No lower extremity edema Neurologically intact  Data Reviewed: I have personally reviewed following labs and imaging studies  CBC: Recent Labs  Lab 06/03/22 0225 06/04/22 0119 06/04/22 2302 06/06/22 0101 06/07/22 0205 06/07/22 1343 06/07/22 2022 06/08/22 0130 06/09/22 0444  WBC 14.6* 9.2   < > 6.7 5.3 5.9  --  6.7 4.6  NEUTROABS 13.3* 8.2*  --   --  3.9  --   --  4.3  --   HGB 11.0* 9.7*   < > 9.5* 8.7* 7.9* 8.6* 8.5* 7.8*  HCT 31.4* 28.3*   < > 27.8* 26.4* 23.7* 25.6* 25.5* 23.6*  MCV 89.0 89.3   < > 88.5 90.7 89.1  --  90.4 91.5  PLT 188 161   < > 224 244 218  --  265 224   < > = values in this interval not displayed.     Basic Metabolic Panel: Recent Labs  Lab 06/03/22 0225 06/04/22 0119 06/04/22 2302 06/06/22 0915 06/07/22 0205 06/08/22 0130  NA 135 137 139 138 139 136  K 3.4* 3.5 3.6 2.8* 3.1* 3.9  CL 102 106 106 99 103 104  CO2 '24 22 26 25 27 25  '$ GLUCOSE 104* 97 89 92 113* 109*  BUN '10 10 10 9 '$ 6* <5*  CREATININE 0.85 0.71 0.73 0.81 0.59 0.58  CALCIUM 8.3* 7.4* 7.5* 7.7* 7.2* 7.4*  MG 1.9 1.8  --  2.0 1.9 1.8  PHOS 2.0* 3.0  --   --  1.3* 1.5*     GFR: Estimated Creatinine Clearance: 50.1 mL/min (by C-G formula based on SCr of 0.58 mg/dL).  Liver Function Tests: Recent Labs  Lab  06/03/22 0225 06/04/22 0119 06/06/22 0915 06/07/22 0205 06/08/22 0130  AST '22 19 30 27 30  '$ ALT '17 14 20 17 20  '$ ALKPHOS 60 47 87 66 67  BILITOT 2.3* 1.4* 1.4* 1.0 0.6  PROT 6.1* 4.6* 6.0* 5.1* 5.3*  ALBUMIN 3.0* 2.0* 2.7* 2.3* 2.4*     CBG: No results for input(s): "GLUCAP" in the last 168 hours.   Recent Results (from the past 240 hour(s))  Surgical PCR screen     Status: None   Collection Time: 06/03/22  2:52 AM   Specimen: Nasal Mucosa; Nasal Swab  Result Value Ref Range Status   MRSA, PCR NEGATIVE NEGATIVE Final   Staphylococcus  aureus NEGATIVE NEGATIVE Final    Comment: (NOTE) The Xpert SA Assay (FDA approved for NASAL specimens in patients 37 years of age and older), is one component of a comprehensive surveillance program. It is not intended to diagnose infection nor to guide or monitor treatment. Performed at [facility], [place] [address], [place], [place] [address]     Radiology Studies: No results found.  Scheduled Meds:  feeding supplement  1 Container Oral TID BM   metoprolol succinate  25 mg Oral QHS   pantoprazole (PROTONIX) IV  40 mg Intravenous Q24H   Continuous Infusions:  methocarbamol (ROBAXIN) IV      LOS: 7 days    Time spent: 37 minutes  Nita Sells, MD [facility]  To contact the attending provider between 7A-7P or the covering provider during after hours 7P-7A, please log into the web site [email or web address] and access using universal [place] password for that web site. If you do not have the password, please call the hospital operator.  06/09/2022, 2:31 PM

## 2022-06-09 NOTE — Progress Notes (Signed)
6 Days Post-Op  Subjective: CC: Reports no abdominal pain. Tolerating cld. She had some nausea after beef broth this am but thinks it because of what she ate. No n/v yesterday. No vomiting this am and nausea has since resolved. Passing flatus. Dark stool this am that she thinks is getting more formed. Working with therapies.   Objective: Vital signs in last 24 hours: Temp:  [98.1 F (36.7 C)-98.5 F (36.9 C)] 98.2 F (36.8 C) (08/30 0755) Pulse Rate:  [77-107] 77 (08/29 1945) Resp:  [17-18] 17 (08/29 1945) BP: (103-128)/(51-66) 103/65 (08/30 0755) SpO2:  [98 %] 98 % (08/29 1407) Last BM Date : 06/08/22  Intake/Output from previous day: No intake/output data recorded. Intake/Output this shift: No intake/output data recorded.  PE: Gen:  Alert, NAD Pulm: rate and effort normal Abd: Soft, minimal distension, nontender, open midline incision with healthy granulation tissue and no cellulitis or purulent drainage/no bleeding  Lab Results:  Recent Labs    06/08/22 0130 06/09/22 0444  WBC 6.7 4.6  HGB 8.5* 7.8*  HCT 25.5* 23.6*  PLT 265 224   BMET Recent Labs    06/07/22 0205 06/08/22 0130  NA 139 136  K 3.1* 3.9  CL 103 104  CO2 27 25  GLUCOSE 113* 109*  BUN 6* <5*  CREATININE 0.59 0.58  CALCIUM 7.2* 7.4*   PT/INR No results for input(s): "LABPROT", "INR" in the last 72 hours. CMP     Component Value Date/Time   NA 136 06/08/2022 0130   NA 138 04/06/2022 1429   K 3.9 06/08/2022 0130   CL 104 06/08/2022 0130   CO2 25 06/08/2022 0130   GLUCOSE 109 (H) 06/08/2022 0130   BUN <5 (L) 06/08/2022 0130   BUN 10 04/06/2022 1429   CREATININE 0.58 06/08/2022 0130   CREATININE 0.90 08/12/2016 1022   CALCIUM 7.4 (L) 06/08/2022 0130   PROT 5.3 (L) 06/08/2022 0130   PROT 6.9 11/21/2019 1035   ALBUMIN 2.4 (L) 06/08/2022 0130   ALBUMIN 4.5 11/21/2019 1035   AST 30 06/08/2022 0130   ALT 20 06/08/2022 0130   ALKPHOS 67 06/08/2022 0130   BILITOT 0.6 06/08/2022 0130    BILITOT 0.9 11/21/2019 1035   GFRNONAA >60 06/08/2022 0130   GFRAA 66 11/21/2019 1035   Lipase     Component Value Date/Time   LIPASE 44 05/31/2022 2344    Studies/Results: No results found.  Anti-infectives: Anti-infectives (From admission, onward)    Start     Dose/Rate Route Frequency Ordered Stop   06/03/22 1400  piperacillin-tazobactam (ZOSYN) IVPB 3.375 g        3.375 g 12.5 mL/hr over 240 Minutes Intravenous Every 8 hours 06/03/22 1346 06/08/22 1009   06/01/22 1200  piperacillin-tazobactam (ZOSYN) IVPB 3.375 g  Status:  Discontinued        3.375 g 12.5 mL/hr over 240 Minutes Intravenous Every 8 hours 06/01/22 0335 06/03/22 1346   06/01/22 0315  piperacillin-tazobactam (ZOSYN) IVPB 3.375 g        3.375 g 100 mL/hr over 30 Minutes Intravenous  Once 06/01/22 0305 06/01/22 0408        Assessment/Plan JEJUNAL DIVERTICULITIS with necrosis  -POD#6 EXPLORATORY LAPAROTOMY, SMALL BOWEL RESECTION 8/24 Dr. Redmond Pulling - Advance to [place]. Add shakes.  - Continues to have some dark stools, likely oozing from her SB anastomosis. Hgb 7.8 from 8.5. Some tachycardia this am but SBP okay. Continue to hold heparin gtt. Recheck labs in am.  - IV PPI  -  Mobilize, PT/OT - recommending HH PT - Daily wet to dry dressing changes. She is discussing with friends and family if she will be able to get assistance with this at home. TOC also working on [employer] RN  - prealbumin low, but hold on TNA as hopeful to advance diet   ID - zosyn 8/22>>8/29  FEN - IVF, FLD VTE - SCDs, Heparin gtt on hold Foley - d/c 8/25 and voiding   Paroxysmal A Fib - xarelto on hold, heparin gtt on hold due to likely bleeding from anastomosis. Cards has seen, high risk for A fib postop and they have given medication recommendations History of constrictive pericarditis s/p pericardial stripping in 2006 HTN Recent Covid-19 infection (diagnosed 05/19/22)   LOS: 7 days    Jillyn Ledger , [facility]  Surgery 06/09/2022, 10:38 AM Please see Amion for pager number during day hours 7:00am-4:30pm

## 2022-06-10 DIAGNOSIS — K5712 Diverticulitis of small intestine without perforation or abscess without bleeding: Secondary | ICD-10-CM | POA: Diagnosis not present

## 2022-06-10 LAB — CBC
HCT: 23.7 % — ABNORMAL LOW (ref 36.0–46.0)
Hemoglobin: 7.9 g/dL — ABNORMAL LOW (ref 12.0–15.0)
MCH: 30.5 pg (ref 26.0–34.0)
MCHC: 33.3 g/dL (ref 30.0–36.0)
MCV: 91.5 fL (ref 80.0–100.0)
Platelets: 222 10*3/uL (ref 150–400)
RBC: 2.59 MIL/uL — ABNORMAL LOW (ref 3.87–5.11)
RDW: 15.1 % (ref 11.5–15.5)
WBC: 5.1 10*3/uL (ref 4.0–10.5)
nRBC: 0 % (ref 0.0–0.2)

## 2022-06-10 LAB — COMPREHENSIVE METABOLIC PANEL
ALT: 21 U/L (ref 0–44)
AST: 33 U/L (ref 15–41)
Albumin: 2.5 g/dL — ABNORMAL LOW (ref 3.5–5.0)
Alkaline Phosphatase: 64 U/L (ref 38–126)
Anion gap: 4 — ABNORMAL LOW (ref 5–15)
BUN: <5 mg/dL — ABNORMAL LOW (ref 8–23)
CO2: 25 mmol/L (ref 22–32)
Calcium: 7.9 mg/dL — ABNORMAL LOW (ref 8.9–10.3)
Chloride: 110 mmol/L (ref 98–111)
Creatinine, Ser: 0.54 mg/dL (ref 0.44–1.00)
GFR, Estimated: >60 mL/min (ref >60)
Glucose, Bld: 99 mg/dL (ref 70–99)
Potassium: 4.2 mmol/L (ref 3.5–5.1)
Sodium: 139 mmol/L (ref 135–145)
Total Bilirubin: 0.5 mg/dL (ref 0.3–1.2)
Total Protein: 5.2 g/dL — ABNORMAL LOW (ref 6.5–8.1)

## 2022-06-10 LAB — MAGNESIUM: Magnesium: 1.9 mg/dL (ref 1.7–2.4)

## 2022-06-10 MED ORDER — METOPROLOL SUCCINATE ER 25 MG PO TB24
25.0000 mg | ORAL_TABLET | Freq: Two times a day (BID) | ORAL | Status: DC
  Administered 2022-06-10 – 2022-06-11 (×2): 25 mg via ORAL
  Filled 2022-06-10 (×2): qty 1

## 2022-06-10 MED ORDER — SPIRONOLACTONE 25 MG PO TABS
50.0000 mg | ORAL_TABLET | Freq: Every day | ORAL | Status: DC
  Administered 2022-06-10 – 2022-06-11 (×2): 50 mg via ORAL
  Filled 2022-06-10 (×2): qty 2

## 2022-06-10 NOTE — Progress Notes (Signed)
General Surgery Follow Up Note  Subjective:    Overnight Issues:   Objective:  Vital signs for last 24 hours: Temp:  [98 F (36.7 C)-98.3 F (36.8 C)] 98.3 F (36.8 C) (08/31 1626) Pulse Rate:  [85-98] 98 (08/31 1626) Resp:  [20] 20 (08/30 1656) BP: (114-137)/(55-64) 124/55 (08/31 1626) SpO2:  [95 %-97 %] 97 % (08/31 1626)  Hemodynamic parameters for last 24 hours:    Intake/Output from previous day: 08/30 0701 - 08/31 0700 In: 120 [P.O.:120] Out: 500 [Urine:500]  Intake/Output this shift: Total I/O In: 480 [P.O.:480] Out: -   Vent settings for last 24 hours:    Physical Exam:  Gen: comfortable, no distress Neuro: non-focal exam HEENT: PERRL Neck: supple CV: RRR Pulm: unlabored breathing Abd: soft, NT, wound healing well GU: clear yellow urine Extr: wwp, no edema   Results for orders placed or performed during the hospital encounter of 05/31/22 (from the past 24 hour(s))  Comprehensive metabolic panel     Status: Abnormal   Collection Time: 06/10/22  1:40 AM  Result Value Ref Range   Sodium 139 135 - 145 mmol/L   Potassium 4.2 3.5 - 5.1 mmol/L   Chloride 110 98 - 111 mmol/L   CO2 25 22 - 32 mmol/L   Glucose, Bld 99 70 - 99 mg/dL   BUN <5 (L) 8 - 23 mg/dL   Creatinine, Ser 0.54 0.44 - 1.00 mg/dL   Calcium 7.9 (L) 8.9 - 10.3 mg/dL   Total Protein 5.2 (L) 6.5 - 8.1 g/dL   Albumin 2.5 (L) 3.5 - 5.0 g/dL   AST 33 15 - 41 U/L   ALT 21 0 - 44 U/L   Alkaline Phosphatase 64 38 - 126 U/L   Total Bilirubin 0.5 0.3 - 1.2 mg/dL   GFR, Estimated >60 >60 mL/min   Anion gap 4 (L) 5 - 15  CBC     Status: Abnormal   Collection Time: 06/10/22  1:40 AM  Result Value Ref Range   WBC 5.1 4.0 - 10.5 K/uL   RBC 2.59 (L) 3.87 - 5.11 MIL/uL   Hemoglobin 7.9 (L) 12.0 - 15.0 g/dL   HCT 23.7 (L) 36.0 - 46.0 %   MCV 91.5 80.0 - 100.0 fL   MCH 30.5 26.0 - 34.0 pg   MCHC 33.3 30.0 - 36.0 g/dL   RDW 15.1 11.5 - 15.5 %   Platelets 222 150 - 400 K/uL   nRBC 0.0 0.0 - 0.2  %  Magnesium     Status: None   Collection Time: 06/10/22  1:40 AM  Result Value Ref Range   Magnesium 1.9 1.7 - 2.4 mg/dL    Assessment & Plan: \  Present on Admission:  PAF (paroxysmal atrial fibrillation) (HCC)  Diverticulitis of small bowel  Hypertension  Urinary urgency  Diverticulitis    LOS: 8 days   Additional comments:None  JEJUNAL DIVERTICULITIS with necrosis  -POD#7 EXPLORATORY LAPAROTOMY, SMALL BOWEL RESECTION 8/24 Dr. Redmond Pulling - Advance to soft diet. Cont shakes.  - Reports normal appearing stools, likely had oozing from her SB anastomosis. Hgb stable. Continue to hold heparin gtt and would not recommend restarting DOAC for at least 1 week. - IV PPI  - Mobilize, PT/OT - recommending HH PT - Daily wet to dry dressing changes. She is discussing with friends and family if she will be able to get assistance with this at home. TOC also working on [employer] RN  - prealbumin low, but advancing diet  ID - zosyn 8/22>>8/29  FEN - IVF, FLD VTE - SCDs, Heparin gtt on hold Foley - d/c 8/25 and voiding   Paroxysmal A Fib - xarelto on hold, heparin gtt on hold due to likely bleeding from anastomosis. Cards has seen, high risk for A fib postop and they have given medication recommendations History of constrictive pericarditis s/p pericardial stripping in 2006 HTN Recent Covid-19 infection (diagnosed 05/19/22)  Yolanda Oka, MD Trauma & General Surgery Please use [email or web address] to contact on call provider  06/10/2022  *Care during the described time interval was provided by me. I have reviewed this patient's available data, including medical history, events of note, physical examination and test results as part of my evaluation.

## 2022-06-10 NOTE — Progress Notes (Signed)
PROGRESS NOTE  Yolanda Bell  JAS:[id number] DOB: March 31, 1941 DOA: 05/31/2022 PCP: Faustino Congress, NP  Chief Complaint  Patient presents with   Abdominal Pain    Brief Narrative:  81 yo with hx atrial fibrillation on xarelto, HTN, recent covid 19 infection who presented with diffuse abdominal pain, nausea, and vomiting as well as urinary urgency/hesitation.  CT scan showed a giant small bowel diverticulum with acute diverticulitis-underwent small bowel resection ex lap 8/24 Diet being advanced as tolerated per surgery.  Heparin currently on hold with suspicion of oozing from small bowel anastomosis.  See below   Assessment & Plan:   Principal Problem:   Diverticulitis of small bowel Active Problems:   GI bleeding   Diverticulitis large intestine   Urinary urgency   Hypertension   PAF (paroxysmal atrial fibrillation) (HCC)   Hypophosphatemia   Nodule of kidney   Hypokalemia   Compression fracture of lumbar vertebra (HCC)   History of COVID-19   History of liver disease   Diverticulitis   Assessment and Plan: * Diverticulitis of small bowel Giant small bowel diverticulum,--5.3 x 7.3 x 6.2 cm up from 2.5cm in 2022. heparin gtt - currently on hold (suspected small bowel oozing) defer to surgery Appreciate general surgery recs ->now s/p small bowel resection and ex lap on 8/24 -> NG D/c 8/30, full liquid diet per surgery  GI bleeding Suspected oozing from small bowel anastomoses Hemoglobin is stable in the 7.8-8.5 range Transfuse for <7 or symptomatic  Diverticulitis large intestine abx Appreciate general surgery recs  Urinary urgency Inflammation about bladder from diverticulitis  Overall seems resolved  Hypertension Cont BB-Toprol-XL resumed at 25 twice daily which is home dose Resumed Aldactone 50 daily at patient request  Hypokalemia Replaced--currently in the 4 range  Hypophosphatemia Recheck in a.m.  PAF (paroxysmal atrial fibrillation) (HCC) Cont  BB. Hold Xarelto, Heparin on hold for GI bleeding Check a.m. magnesium Resumed propanolol-can resume that for heart rate above 120 Seen by cardiology 8/22, can hold anticoagulation as needed, continue beta blocker, high risk for post surgical atrial fib  Nodule of kidney Concerning for renal cell carcinoma--understand needs outpatient characterization with MRI and follow-up with PCP to coordinate this  Compression fracture of lumbar vertebra (HCC) Compression Fx of L5, while severe, is old, stable on imaging and chronic (was chronic even on 06/2021 CT).  History of COVID-19 August 9th symptoms started, tested positive 8/10 8/17 she reports home test was negative Has some residual L ear pain, otherwise sx improved  History of liver disease Unclear hx surrounding this Related to steroids/volume?  She has several questions about her diuretics we will resume Aldactone 50--torsemide 20 is on hold at this time  Spasmodic dysphonia, tremor Stable at this time     DVT prophylaxis: heparin gtt on hold, SCD Code Status: full Family Communication: none  Disposition:   Status is: Observation The patient will require care spanning > 2 midnights and should be moved to inpatient because: need for abx/surgery   Consultants:  Surgery cardiology  Procedures:  none  Antimicrobials:  Anti-infectives (From admission, onward)    Start     Dose/Rate Route Frequency Ordered Stop   06/03/22 1400  piperacillin-tazobactam (ZOSYN) IVPB 3.375 g        3.375 g 12.5 mL/hr over 240 Minutes Intravenous Every 8 hours 06/03/22 1346 06/08/22 1009   06/01/22 1200  piperacillin-tazobactam (ZOSYN) IVPB 3.375 g  Status:  Discontinued        3.375 g 12.5 mL/hr  over 240 Minutes Intravenous Every 8 hours 06/01/22 0335 06/03/22 1346   06/01/22 0315  piperacillin-tazobactam (ZOSYN) IVPB 3.375 g        3.375 g 100 mL/hr over 30 Minutes Intravenous  Once 06/01/22 0305 06/01/22 0408        Subjective:  Remains anxious Otherwise seems fair No distress No chest pain states stool today was brown and formed more consistency no nausea no vomiting able to eat full liquid   Objective: Vitals:   06/09/22 0755 06/09/22 1656 06/09/22 2100 06/10/22 0818  BP: 103/65 124/60 137/64 114/63  Pulse:   98 85  Resp:  20    Temp: 98.2 F (36.8 C) 98.1 F (36.7 C) 98 F (36.7 C) 98.1 F (36.7 C)  TempSrc: Oral Oral Oral Oral  SpO2:   95% 97%  Weight:      Height:        Intake/Output Summary (Last 24 hours) at 06/10/2022 1104 Last data filed at 06/10/2022 0129 Gross per 24 hour  Intake 120 ml  Output 500 ml  Net -380 ml     Filed Weights   05/31/22 2332 06/03/22 0939  Weight: 63 kg 63 kg    Examination:  Awake coherent no distress EOMI NCAT no focal deficit S1-S2 no murmur-sinus tach at times to about 120 Chest is clear Abdomen is nontender I did not examine below the dressings No lower extremity edema Neurologically intact  Data Reviewed: I have personally reviewed following labs and imaging studies  CBC: Recent Labs  Lab 06/04/22 0119 06/04/22 2302 06/07/22 0205 06/07/22 1343 06/07/22 2022 06/08/22 0130 06/09/22 0444 06/10/22 0140  WBC 9.2   < > 5.3 5.9  --  6.7 4.6 5.1  NEUTROABS 8.2*  --  3.9  --   --  4.3  --   --   HGB 9.7*   < > 8.7* 7.9* 8.6* 8.5* 7.8* 7.9*  HCT 28.3*   < > 26.4* 23.7* 25.6* 25.5* 23.6* 23.7*  MCV 89.3   < > 90.7 89.1  --  90.4 91.5 91.5  PLT 161   < > 244 218  --  265 224 222   < > = values in this interval not displayed.     Basic Metabolic Panel: Recent Labs  Lab 06/04/22 0119 06/04/22 2302 06/06/22 0915 06/07/22 0205 06/08/22 0130 06/10/22 0140  NA 137 139 138 139 136 139  K 3.5 3.6 2.8* 3.1* 3.9 4.2  CL 106 106 99 103 104 110  CO2 '22 26 25 27 25 25  '$ GLUCOSE 97 89 92 113* 109* 99  BUN '10 10 9 '$ 6* <5* <5*  CREATININE 0.71 0.73 0.81 0.59 0.58 0.54  CALCIUM 7.4* 7.5* 7.7* 7.2* 7.4* 7.9*  MG 1.8  --  2.0 1.9  1.8 1.9  PHOS 3.0  --   --  1.3* 1.5*  --      GFR: Estimated Creatinine Clearance: 50.1 mL/min (by C-G formula based on SCr of 0.54 mg/dL).  Liver Function Tests: Recent Labs  Lab 06/04/22 0119 06/06/22 0915 06/07/22 0205 06/08/22 0130 06/10/22 0140  AST '19 30 27 30 '$ 33  ALT '14 20 17 20 21  '$ ALKPHOS 47 87 66 67 64  BILITOT 1.4* 1.4* 1.0 0.6 0.5  PROT 4.6* 6.0* 5.1* 5.3* 5.2*  ALBUMIN 2.0* 2.7* 2.3* 2.4* 2.5*     CBG: No results for input(s): "GLUCAP" in the last 168 hours.   Recent Results (from the past 240 hour(s))  Surgical PCR  screen     Status: None   Collection Time: 06/03/22  2:52 AM   Specimen: Nasal Mucosa; Nasal Swab  Result Value Ref Range Status   MRSA, PCR NEGATIVE NEGATIVE Final   Staphylococcus aureus NEGATIVE NEGATIVE Final    Comment: (NOTE) The Xpert SA Assay (FDA approved for NASAL specimens in patients 6 years of age and older), is one component of a comprehensive surveillance program. It is not intended to diagnose infection nor to guide or monitor treatment. Performed at [facility], [place] [address] [place], [place] [address]     Radiology Studies: No results found.  Scheduled Meds:  feeding supplement  1 Container Oral TID BM   metoprolol succinate  25 mg Oral BID   pantoprazole (PROTONIX) IV  40 mg Intravenous Q24H   spironolactone  50 mg Oral Daily   Continuous Infusions:  methocarbamol (ROBAXIN) IV      LOS: 8 days    Time spent: 37 minutes  Nita Sells, MD [facility]  To contact the attending provider between 7A-7P or the covering provider during after hours 7P-7A, please log into the web site [email or web address] and access using universal [facility] password for that web site. If you do not have the password, please call the hospital operator.  06/10/2022, 11:04 AM

## 2022-06-10 NOTE — Progress Notes (Signed)
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  8/29 imaging concerning for renal cell carcinoma.  Pt underwent small bowel resection. PMH:  atrial fibrillation on xarelto, HTN, recent covid 19 infection    PT Comments    Pt was seen for mobility on RW with help and noted her efforts to move were good but tiring.  Titrating speed and conversation to avoid overdoing SOB and HR, with successful control to 117 HR with second half of walk.  Pt is up in a chair at end of session with safe transfers observed, safe transition around obstacles in her room.  Follow up as stay permits, but is expecting to go home tomorrow.  Encourage OOB in chair with gait as opportunities present.  Recommendations for follow up therapy are one component of a multi-disciplinary discharge planning process, led by the attending physician.  Recommendations may be updated based on patient status, additional functional criteria and insurance authorization.  Follow Up Recommendations  Home health PT     Assistance Recommended at Discharge PRN  Patient can return home with the following A little help with walking and/or transfers;A little help with bathing/dressing/bathroom;Assistance with cooking/housework;Assist for transportation   Equipment Recommendations  None recommended by PT    Recommendations for Other Services       Precautions / Restrictions Precautions Precautions: Other (comment) Precaution Comments: new kidney CA dx, WATCH HR Restrictions Weight Bearing Restrictions: No     Mobility  Bed Mobility Overal bed mobility: Needs Assistance Bed Mobility: Supine to Sit   Sidelying to sit: Supervision Supine to sit: Supervision           Transfers Overall transfer level: Needs assistance Equipment used: Straight cane Transfers: Sit to/from Stand Sit to Stand: Supervision                Ambulation/Gait Ambulation/Gait assistance: Supervision Gait Distance (Feet): 150 Feet Assistive device: Straight cane (hurry cane) Gait Pattern/deviations: Step-through pattern, Decreased stride length Gait velocity: reduced Gait velocity interpretation: <1.31 ft/sec, indicative of household ambulator   General Gait Details: SOB with gait and HR up to 129 briefly, stood and rested and then controlled HR and SOB   Stairs             Wheelchair Mobility    Modified Rankin (Stroke Patients Only)       Balance Overall balance assessment: Needs assistance Sitting-balance support: Feet supported Sitting balance-Leahy Scale: Good     Standing balance support: Single extremity supported Standing balance-Leahy Scale: Fair                              Cognition Arousal/Alertness: Awake/alert Behavior During Therapy: WFL for tasks assessed/performed, Anxious Overall Cognitive Status: Impaired/Different from baseline Area of Impairment: Memory                     Memory: Decreased short-term memory (minor reminders needed)         General Comments: had a challenging day but reported [name] had really helped her and cheered her up        Exercises      General Comments General comments (skin integrity, edema, etc.): pt is up to walk with help  and requires minor assist to maneuver, HR observed entire trip.  Her efforts were good with 129 max rate, but with reduced talking kept HR down to 117 max      Pertinent Vitals/Pain Pain Assessment Pain Assessment: No/denies pain    Home Living                          Prior Function            PT Goals (current goals can now be found in the care plan section) Acute Rehab PT Goals Patient Stated Goal: to go home Progress  towards PT goals: Progressing toward goals    Frequency    Min 3X/week      PT Plan Current plan remains appropriate    Co-evaluation              AM-PAC PT "6 Clicks" Mobility   Outcome Measure  Help needed turning from your back to your side while in a flat bed without using bedrails?: A Little Help needed moving from lying on your back to sitting on the side of a flat bed without using bedrails?: A Little Help needed moving to and from a bed to a chair (including a wheelchair)?: A Little Help needed standing up from a chair using your arms (e.g., wheelchair or bedside chair)?: A Little Help needed to walk in hospital room?: A Little Help needed climbing 3-5 steps with a railing? : A Little 6 Click Score: 18    End of Session Equipment Utilized During Treatment: Gait belt Activity Tolerance: Patient limited by fatigue;Treatment limited secondary to medical complications (Comment) Patient left: in chair;with call bell/phone within reach;with chair alarm set Nurse Communication: Mobility status PT Visit Diagnosis: Muscle weakness (generalized) (M62.81);Difficulty in walking, not elsewhere classified (R26.2)     Time: 1610-9604 PT Time Calculation (min) (ACUTE ONLY): 23 min  Charges:  $Gait Training: 8-22 mins $Therapeutic Activity: 8-22 mins      Ramond Dial 06/10/2022, 3:01 PM  Mee Hives, PT PhD Acute Rehab Dept. Number: [place]

## 2022-06-10 NOTE — Care Management Important Message (Signed)
Important Message  Patient Details  Name: Yolanda Bell MRN: [id number] Date of Birth: [birth year]   Medicare Important Message Given:  Yes     Hannah Beat 06/10/2022, 2:11 PM

## 2022-06-11 DIAGNOSIS — K5712 Diverticulitis of small intestine without perforation or abscess without bleeding: Secondary | ICD-10-CM | POA: Diagnosis not present

## 2022-06-11 LAB — CBC
HCT: 24.5 % — ABNORMAL LOW (ref 36.0–46.0)
Hemoglobin: 8 g/dL — ABNORMAL LOW (ref 12.0–15.0)
MCH: 30.3 pg (ref 26.0–34.0)
MCHC: 32.7 g/dL (ref 30.0–36.0)
MCV: 92.8 fL (ref 80.0–100.0)
Platelets: 215 10*3/uL (ref 150–400)
RBC: 2.64 MIL/uL — ABNORMAL LOW (ref 3.87–5.11)
RDW: 15.6 % — ABNORMAL HIGH (ref 11.5–15.5)
WBC: 5.5 10*3/uL (ref 4.0–10.5)
nRBC: 0 % (ref 0.0–0.2)

## 2022-06-11 LAB — COMPREHENSIVE METABOLIC PANEL
ALT: 20 U/L (ref 0–44)
AST: 25 U/L (ref 15–41)
Albumin: 2.5 g/dL — ABNORMAL LOW (ref 3.5–5.0)
Alkaline Phosphatase: 66 U/L (ref 38–126)
Anion gap: 6 (ref 5–15)
BUN: <5 mg/dL — ABNORMAL LOW (ref 8–23)
CO2: 25 mmol/L (ref 22–32)
Calcium: 8.2 mg/dL — ABNORMAL LOW (ref 8.9–10.3)
Chloride: 107 mmol/L (ref 98–111)
Creatinine, Ser: 0.55 mg/dL (ref 0.44–1.00)
GFR, Estimated: >60 mL/min (ref >60)
Glucose, Bld: 112 mg/dL — ABNORMAL HIGH (ref 70–99)
Potassium: 4 mmol/L (ref 3.5–5.1)
Sodium: 138 mmol/L (ref 135–145)
Total Bilirubin: 0.4 mg/dL (ref 0.3–1.2)
Total Protein: 5.3 g/dL — ABNORMAL LOW (ref 6.5–8.1)

## 2022-06-11 LAB — MAGNESIUM: Magnesium: 1.9 mg/dL (ref 1.7–2.4)

## 2022-06-11 LAB — PHOSPHORUS: Phosphorus: 3.7 mg/dL (ref 2.5–4.6)

## 2022-06-11 MED ORDER — RIVAROXABAN 20 MG PO TABS: 20.0000 mg | ORAL_TABLET | Freq: Every day | ORAL | 0 refills | Status: DC

## 2022-06-11 MED ORDER — OXYCODONE HCL 5 MG PO TABS: 2.5000 mg | ORAL_TABLET | Freq: Four times a day (QID) | ORAL | 0 refills | Status: DC | PRN

## 2022-06-11 NOTE — Progress Notes (Signed)
8 Days Post-Op  Subjective: CC: Doing well. No abdominal pain. Tolerating soft diet and finished almost of breakfast without n/v. Mobilizing with therapies yesterday (180f with straight cane). Voiding. Non-bloody bm yesterday.   Objective: Vital signs in last 24 hours: Temp:  [98.1 F (36.7 C)-98.4 F (36.9 C)] 98.1 F (36.7 C) (09/01 0413) Pulse Rate:  [85-98] 85 (09/01 0413) Resp:  [15-20] 20 (09/01 0413) BP: (119-124)/(55-61) 119/59 (09/01 0920) SpO2:  [96 %-99 %] 96 % (09/01 0413) Last BM Date : 06/10/22  Intake/Output from previous day: 08/31 0701 - 09/01 0700 In: 840 [P.O.:840] Out: -  Intake/Output this shift: Total I/O In: 240 [P.O.:240] Out: -   PE: Gen:  Alert, NAD Pulm: rate and effort normal Abd: Soft, ND, nontender, open midline incision with healthy granulation tissue at the base. No evidence of dehiscence.   Lab Results:  Recent Labs    06/10/22 0140 06/11/22 0310  WBC 5.1 5.5  HGB 7.9* 8.0*  HCT 23.7* 24.5*  PLT 222 215   BMET Recent Labs    06/10/22 0140 06/11/22 0310  NA 139 138  K 4.2 4.0  CL 110 107  CO2 25 25  GLUCOSE 99 112*  BUN <5* <5*  CREATININE 0.54 0.55  CALCIUM 7.9* 8.2*   PT/INR No results for input(s): "LABPROT", "INR" in the last 72 hours. CMP     Component Value Date/Time   NA 138 06/11/2022 0310   NA 138 04/06/2022 1429   K 4.0 06/11/2022 0310   CL 107 06/11/2022 0310   CO2 25 06/11/2022 0310   GLUCOSE 112 (H) 06/11/2022 0310   BUN <5 (L) 06/11/2022 0310   BUN 10 04/06/2022 1429   CREATININE 0.55 06/11/2022 0310   CREATININE 0.90 08/12/2016 1022   CALCIUM 8.2 (L) 06/11/2022 0310   PROT 5.3 (L) 06/11/2022 0310   PROT 6.9 11/21/2019 1035   ALBUMIN 2.5 (L) 06/11/2022 0310   ALBUMIN 4.5 11/21/2019 1035   AST 25 06/11/2022 0310   ALT 20 06/11/2022 0310   ALKPHOS 66 06/11/2022 0310   BILITOT 0.4 06/11/2022 0310   BILITOT 0.9 11/21/2019 1035   GFRNONAA >60 06/11/2022 0310   GFRAA 66 11/21/2019 1035    Lipase     Component Value Date/Time   LIPASE 44 05/31/2022 2344    Studies/Results: No results found.  Anti-infectives: Anti-infectives (From admission, onward)    Start     Dose/Rate Route Frequency Ordered Stop   06/03/22 1400  piperacillin-tazobactam (ZOSYN) IVPB 3.375 g        3.375 g 12.5 mL/hr over 240 Minutes Intravenous Every 8 hours 06/03/22 1346 06/08/22 1009   06/01/22 1200  piperacillin-tazobactam (ZOSYN) IVPB 3.375 g  Status:  Discontinued        3.375 g 12.5 mL/hr over 240 Minutes Intravenous Every 8 hours 06/01/22 0335 06/03/22 1346   06/01/22 0315  piperacillin-tazobactam (ZOSYN) IVPB 3.375 g        3.375 g 100 mL/hr over 30 Minutes Intravenous  Once 06/01/22 0305 06/01/22 0408        Assessment/Plan JEJUNAL DIVERTICULITIS with necrosis  POD#8 EXPLORATORY LAPAROTOMY, SMALL BOWEL RESECTION 8/24 Dr. WRedmond Pulling- Patient is tolerating soft diet without abdominal pain, n/v. She is ND/NT on exam. She is not requiring any prn medications. Midline wound clean. Cont daily WTD. I showed her how to perform these. She is asking friends/family for assistance as well. TOC has arranging [place] [place]- written for RN and PT. No further bloody  bm's. Lkely had oozing from her SB anastomosis that has now resolved. Hgb stable. Would recommend holding DOAC for at least 1 wee and following up with PCP at d/c. If above can be arranged, she is okay for d/c from our standpoint. We will arrange follow up in the office. Will send pain meds to her pharmacy. Discussed discharge instructions, restrictions and return/call back precautions    ID - zosyn 8/22>>8/29. None currently.  FEN - IVF, Soft diet.  VTE - SCDs, Heparin gtt on hold as noted above.  Foley - d/c 8/25 and voiding   Paroxysmal A Fib - xarelto on hold, heparin gtt on hold due to likely bleeding from anastomosis. Cards has seen, high risk for A fib postop and they have given medication recommendations History of constrictive pericarditis  s/p pericardial stripping in 2006 HTN Recent Covid-19 infection (diagnosed 05/19/22)     LOS: 9 days    Jillyn Ledger , [facility] 06/11/2022, 10:35 AM Please see Amion for pager number during day hours 7:00am-4:30pm

## 2022-06-11 NOTE — TOC Progression Note (Signed)
Transition of Care [facility]) - Progression Note    Patient Details  Name: Yolanda Bell MRN: [id number] Date of Birth: 1940/11/30  Transition of Care [facility]) CM/SW Contact  Loletha Grayer Beverely Pace, RN Phone Number: 06/11/2022, 11:57 AM  Clinical Narrative:    Case manager spoke with patient concerning her discharge plan, she also informed me that she will be receiving meals through "[employer]" for two weeks, covered by [facility].    Expected Discharge Plan: [place] Barriers to Discharge: Continued Medical Work up  Expected Discharge Plan and Services Expected Discharge Plan: [place]   Discharge Planning Services: CM Consult Post Acute Care Choice: [place] arrangements for the past 2 months: Single Family Home Expected Discharge Date: 06/11/22                         HH Arranged: PT, Nurse's Aide HH Agency: [place] Date [facility] Agency Contacted: 06/05/22 Time [place]: [id number] Representative spoke with at [place]: [place] Determinants of Health ([place]) Interventions    Readmission Risk Interventions     No data to display

## 2022-06-11 NOTE — Discharge Summary (Signed)
Physician Discharge Summary  Yolanda Bell VZD:[id number] DOB: 16-May-1941 DOA: 05/31/2022  PCP: Faustino Congress, NP  Admit date: 05/31/2022 Discharge date: 06/11/2022  Time spent: 37 minutes  Recommendations for Outpatient Follow-up:  Requires Chem-12 CBC magnesium probably in about a week's time Xarelto 20 mg held on discharge-resume on 06/18/2022 after about 1 week's time Postop care as below-General surgery to schedule follow-up outpatient appointment Recommend outpatient scan in about a month to determine if she does not fact have [place] of the kidney Torsemide was held this hospital stay and could be considered to resume in the outpatient setting  Discharge Diagnoses:  MAIN problem for hospitalization   Large small bowel diverticulum status post ex lap Nodule on kidney suspicious for RCC Paroxysmal A-fib during hospitalization with tachycardia Subacute GI bleed from post anastomotic leak     Please see below for itemized issues addressed in [place]- refer to other progress notes for clarity if needed  Discharge Condition: Improved  Diet recommendation: Heart healthy  Filed Weights   05/31/22 2332 06/03/22 0939  Weight: 63 kg 63 kg    History of present illness:  81 yo with hx atrial fibrillation on xarelto, HTN, recent covid 19 infection who presented with diffuse abdominal pain, nausea, and vomiting as well as urinary urgency/hesitation.   CT scan showed a giant small bowel diverticulum with acute diverticulitis-underwent small bowel resection ex lap 8/24 Diet being advanced as tolerated per surgery.  Heparin currently on hold with suspicion of oozing from small bowel anastomosis.    Hospital Course:  Diverticulitis of small bowel Giant small bowel diverticulum,--5.3 x 7.3 x 6.2 cm up from 2.5cm in 2022. heparin gtt - currently on hold (suspected small bowel oozing) defer to surgery Appreciate general surgery recs ->now s/p small bowel resection and ex lap on 8/24 ->  NG D/c 8/30, diet was graduated and patient was tolerating the same very well   GI bleeding Suspected oozing from small bowel anastomoses Hemoglobin is stable in the 7.8-8.5 range Transfuse for <7 or symptomatic Discussed with general surgery who recommends the patient can resume Xarelto 20 mg in about 7 days from discharge to allow for healing of anastomotic oozing and patient was instructed with regards to this   Diverticulitis large intestine abx Appreciate general surgery recs   Urinary urgency Inflammation about bladder from diverticulitis  Overall seems resolved   Hypertension Cont BB-Toprol-XL resumed at 25 twice daily which is home dose Resumed Aldactone 50 daily   Hypokalemia Stabilized and replaced on discharge   Hypophosphatemia Stabilized on discharge   PAF (paroxysmal atrial fibrillation) ([place]) Cont BB. Hold Xarelto, Heparin on hold for GI bleeding Propranolol was resumed that for heart rate above 120 Seen by cardiology 8/22, can hold anticoagulation as needed, continue beta blocker, high risk for post surgical atrial fib   Nodule of kidney Concerning for renal cell carcinoma--understand needs outpatient characterization with MRI and follow-up with PCP to coordinate this   Compression fracture of lumbar vertebra (HCC) Compression Fx of L5, while severe, is old, stable on imaging and chronic (was chronic even on 06/2021 CT).   History of COVID-19 August 9th symptoms started, tested positive 8/10 8/17 she reports home test was negative Has some residual L ear pain, otherwise sx improved   History of liver disease Unclear hx surrounding this Related to steroids/volume?  She has several questions about her diuretics we will resume Aldactone 50-- Torsemide can be resumed as an outpatient   Spasmodic dysphonia, tremor Outpatient further  follow-up-stable   Discharge Exam: Vitals:   06/10/22 2119 06/11/22 0413  BP: 123/61 (!) 119/59  Pulse: 95 85  Resp:  15 20  Temp: 98.4 F (36.9 C) 98.1 F (36.7 C)  SpO2: 99% 96%    Subj on day of d/c   In good spirits, sitting up at the bedside table eating a diet no chest pain no fever`  General Exam on discharge  Awake coherent no distress EOMI NCAT no focal deficit no icterus no pallor Chest clear no added sound S1-S2 no murmur seems to be in sinus Abdomen is soft I did not disturb bandages No lower extremity edema Neurologically intact   Discharge Instructions   Discharge Instructions     Diet - low sodium heart healthy   Complete by: As directed    Discharge instructions   Complete by: As directed    All please ensure that you take all of the meds as indicated as you have been taking We will get home health come out and provide you with therapy services in addition to home health nursing for daily wet-to-dry dressings to the abdomen This hospital stay you have an abnormality noticed on your kidneys which will require further outpatient characterization as we discussed in about a month's time-please follow-up with the general surgeon first to ensure that the incisions are clean and that the healing of your abdomen is going well and subsequently your primary care physician and you can figure out the next steps including imaging of the area in your back with another scan It would be a good idea to get labs in about 1 week's time to check your blood count nausea chemistries You can use Tylenol for mild pain as you are not experiencing any severe pain As noted you are supposed to be on Xarelto 20 mg daily for your atrial fibrillation and you should resume this probably on 06/18/2022 after about 1 week as this would allow for adequate healing of your surgical area  Try not to take your indomethacin for the next week or any other nonsteroidals (only use Tylenol) because of risk of causing oozing from your abdomen  Should you have high fever chills severe dark stool or tarry stool or unformed  stool or severe pain please present yourself to be seen at the emergency room   Discharge wound care:   Complete by: As directed    Need wet-to-dry dressings daily   Increase activity slowly   Complete by: As directed       Allergies as of 06/11/2022       Reactions   Corticosteroids Other (See Comments)   Liver scarring as a child   Covid-19 (mrna) Vaccine Other (See Comments)   Postural Orthostatic Tachycardia Syndrome (POTS)   Other Other (See Comments)   Cannot take STEROIDS-unknown reaction-caused by internal scarring on liver.    Anesthesia precaution due to excessive liver scarring    Pacerone [amiodarone] Other (See Comments)   Dizziness Visual hallucinations   Erythromycin Nausea And Vomiting, Rash   Zyloprim [allopurinol] Rash, Other (See Comments)   Skin peeling More severe gout         Medication List     STOP taking these medications    Delsym 30 MG/5ML liquid Generic drug: dextromethorphan   indomethacin 25 MG capsule Commonly known as: INDOCIN       TAKE these medications    CALCIUM + VITAMIN D3 PO Take 1 tablet by mouth daily.   denosumab 60  MG/ML Sosy injection Commonly known as: PROLIA Inject 60 mg into the skin every 6 (six) months. Last dose End of may early april   Denta 5000 Plus 1.1 % Crea dental cream Generic drug: sodium fluoride Place 1 application  onto teeth 2 (two) times daily.   metoprolol succinate 25 MG 24 hr tablet Commonly known as: TOPROL-XL Take one tablet by mouth (25 mg) twice a day. What changed:  how much to take how to take this when to take this additional instructions   propranolol 10 MG tablet Commonly known as: INDERAL TAKE 1 TABLET BY MOUTH 4 TIMES A DAY AS NEEDED What changed: See the new instructions.   spironolactone 50 MG tablet Commonly known as: ALDACTONE TAKE 1 TABLET BY MOUTH EVERY DAY   VITAMIN C PO Take 1 tablet by mouth daily.               Discharge Care Instructions  (From  admission, onward)           Start     Ordered   06/11/22 0000  Discharge wound care:       Comments: Need wet-to-dry dressings daily   06/11/22 0909           Allergies  Allergen Reactions   Corticosteroids Other (See Comments)    Liver scarring as a child   Covid-19 (Mrna) Vaccine Other (See Comments)    Postural Orthostatic Tachycardia Syndrome (POTS)   Other Other (See Comments)    Cannot take STEROIDS-unknown reaction-caused by internal scarring on liver.    Anesthesia precaution due to excessive liver scarring    Pacerone [Amiodarone] Other (See Comments)    Dizziness Visual hallucinations   Erythromycin Nausea And Vomiting and Rash   Zyloprim [Allopurinol] Rash and Other (See Comments)    Skin peeling More severe gout     Follow-up Information     [facility], [place] Follow up.   Specialty: [place] Why: someone from the office at [facility] will call to schedule home health visits Contact information: [place] [employer] [address] [phone]                  The results of significant diagnostics from this hospitalization (including imaging, microbiology, ancillary and laboratory) are listed below for reference.    Significant Diagnostic Studies: CT ABDOMEN PELVIS W CONTRAST  Result Date: 06/01/2022 CLINICAL DATA:  Abdominal pain, acute, nonlocalized. Nausea, vomiting. Urinary hesitancy. EXAM: CT ABDOMEN AND PELVIS WITH CONTRAST TECHNIQUE: Multidetector CT imaging of the abdomen and pelvis was performed using the standard protocol following bolus administration of intravenous contrast. RADIATION DOSE REDUCTION: This exam was performed according to the departmental dose-optimization program which includes automated exposure control, adjustment of the mA and/or kV according to patient size and/or use of iterative reconstruction technique. CONTRAST:  152m OMNIPAQUE IOHEXOL 300 MG/ML  SOLN COMPARISON:  06/20/2021  FINDINGS: Lower chest: Stable 8 mm left lower lobe pulmonary nodule since remote prior examination of 12/25/2004, benign. Mild cardiomegaly. Pericardial calcifications are again identified. Small hiatal hernia. Hepatobiliary: Status post cholecystectomy. Mild intra and extrahepatic biliary ductal dilation is stable in keeping with post cholecystectomy change. Liver unremarkable. Pancreas: Unremarkable Spleen: Unremarkable Adrenals/Urinary Tract: The adrenal glands are unremarkable. The kidneys are normal in size and position. A 16 mm enhancing nodule arises exophytically from the interpolar region the left kidney most in keeping with a primary renal cell carcinoma. This appears stable in size since prior examination. No hydronephrosis.  No intrarenal or ureteral calculi. The bladder is unremarkable. Stomach/Bowel: There is a 5.3 x 7.3 x 6.2 cm debris and gas filled collection within the left mid abdomen demonstrating communication with a a loop of mid jejunum at axial image # 37/3 most in keeping with a giant small-bowel diverticulum. This was likely present on prior examination on image # 39/3 of the prior exam but has significantly increased in size since prior examination, where this measured 2.5 cm, and demonstrates significantly increased intraluminal debris. There is now extensive surrounding inflammatory change as well as thickening of the adjacent loop of small bowel in keeping with changes of superimposed small bowel diverticulitis. There is severe descending and sigmoid colonic diverticulosis. There is a superimposed focus of acute inflammatory change involving the mid sigmoid colon best seen on axial image # 70/3 in keeping with mild superimposed acute sigmoid diverticulitis. The inflammatory changes abut the bladder. No evidence of obstruction or perforation. The stomach, small bowel, and large bowel are otherwise unremarkable. No free intraperitoneal gas. Small free fluid within the pelvis and within the  perihepatic region Vascular/Lymphatic: Aortic atherosclerosis. No enlarged abdominal or pelvic lymph nodes. Reproductive: Status post hysterectomy. No adnexal masses. Other: Tiny fat containing umbilical hernia Musculoskeletal: Severe compression deformity of L5 with 80% loss of height and retropulsion of the posterior aspect of the vertebral body appears stable. Marked resultant central canal stenosis again noted. No acute bone abnormality. IMPRESSION: 1. Giant small-bowel diverticulum within the left mid abdomen demonstrating communication with a loop of mid jejunum. This has significantly increased in size since prior examination and demonstrates significantly increased intraluminal debris. There is now extensive surrounding inflammatory change as well as thickening of the adjacent loop of small bowel in keeping with superimposed small bowel diverticulitis. No evidence of obstruction or perforation. 2. Superimposed mild acute sigmoid diverticulitis. No evidence of obstruction or perforation. Inflammatory changes abut the bladder and may account for the patient's urinary symptoms. 3. Stable 16 mm enhancing nodule within the left kidney most in keeping with a primary renal cell carcinoma. This should be confirmed with dedicated renal mass protocol CT or MRI examination once the patient's acute issues have resolved. 4. Stable severe compression deformity of L5 with 80% loss of height and retropulsion of the posterior aspect of the vertebral body. Marked resultant central canal stenosis. 5. Mild cardiomegaly. Pericardial calcifications again noted. Aortic Atherosclerosis (ICD10-I70.0). Electronically Signed   By: Fidela Salisbury M.D.   On: 06/01/2022 02:21   US Venous Img Lower Unilateral Right  Result Date: 05/23/2022 CLINICAL DATA:  Right lower extremity pain EXAM: RIGHT LOWER EXTREMITY VENOUS DOPPLER ULTRASOUND TECHNIQUE: Gray-scale sonography with compression, as well as color and duplex ultrasound, were  performed to evaluate the deep venous system(s) from the level of the common femoral vein through the popliteal and proximal calf veins. COMPARISON:  None Available. FINDINGS: VENOUS Normal compressibility of the common femoral, superficial femoral, and popliteal veins, as well as the visualized calf veins. Visualized portions of profunda femoral vein and great saphenous vein unremarkable. No filling defects to suggest DVT on grayscale or color Doppler imaging. Doppler waveforms show normal direction of venous flow, normal respiratory plasticity and response to augmentation. Limited views of the contralateral common femoral vein are unremarkable. OTHER None. Limitations: none IMPRESSION: Negative. Electronically Signed   By: Kathreen Devoid M.D.   On: 05/23/2022 10:57   DG Foot Complete Right  Result Date: 05/22/2022 CLINICAL DATA:  Bruise EXAM: RIGHT FOOT COMPLETE - 3+ VIEW COMPARISON:  None Available. FINDINGS: There is no evidence of fracture or dislocation. There are mild degenerative changes of the first metatarsophalangeal joint with joint space narrowing and sclerosis. Small posterior calcaneal spur is present. Soft tissues are unremarkable. IMPRESSION: No acute abnormality. Electronically Signed   By: Ronney Asters M.D.   On: 05/22/2022 22:18    Microbiology: Recent Results (from the past 240 hour(s))  Surgical PCR screen     Status: None   Collection Time: 06/03/22  2:52 AM   Specimen: Nasal Mucosa; Nasal Swab  Result Value Ref Range Status   MRSA, PCR NEGATIVE NEGATIVE Final   Staphylococcus aureus NEGATIVE NEGATIVE Final    Comment: (NOTE) The Xpert SA Assay (FDA approved for NASAL specimens in patients 65 years of age and older), is one component of a comprehensive surveillance program. It is not intended to diagnose infection nor to guide or monitor treatment. Performed at [facility], [place] [address], [place], [place] [address]      Labs: Basic Metabolic Panel: Recent Labs   Lab 06/06/22 0915 06/07/22 0205 06/08/22 0130 06/10/22 0140 06/11/22 0310  NA 138 139 136 139 138  K 2.8* 3.1* 3.9 4.2 4.0  CL 99 103 104 110 107  CO2 '25 27 25 25 25  '$ GLUCOSE 92 113* 109* 99 112*  BUN 9 6* <5* <5* <5*  CREATININE 0.81 0.59 0.58 0.54 0.55  CALCIUM 7.7* 7.2* 7.4* 7.9* 8.2*  MG 2.0 1.9 1.8 1.9 1.9  PHOS  --  1.3* 1.5*  --  3.7   Liver Function Tests: Recent Labs  Lab 06/06/22 0915 06/07/22 0205 06/08/22 0130 06/10/22 0140 06/11/22 0310  AST '30 27 30 '$ 33 25  ALT '20 17 20 21 20  '$ ALKPHOS 87 66 67 64 66  BILITOT 1.4* 1.0 0.6 0.5 0.4  PROT 6.0* 5.1* 5.3* 5.2* 5.3*  ALBUMIN 2.7* 2.3* 2.4* 2.5* 2.5*   No results for input(s): "LIPASE", "AMYLASE" in the last 168 hours. No results for input(s): "AMMONIA" in the last 168 hours. CBC: Recent Labs  Lab 06/07/22 0205 06/07/22 1343 06/07/22 2022 06/08/22 0130 06/09/22 0444 06/10/22 0140 06/11/22 0310  WBC 5.3 5.9  --  6.7 4.6 5.1 5.5  NEUTROABS 3.9  --   --  4.3  --   --   --   HGB 8.7* 7.9* 8.6* 8.5* 7.8* 7.9* 8.0*  HCT 26.4* 23.7* 25.6* 25.5* 23.6* 23.7* 24.5*  MCV 90.7 89.1  --  90.4 91.5 91.5 92.8  PLT 244 218  --  265 224 222 215   Cardiac Enzymes: No results for input(s): "CKTOTAL", "CKMB", "CKMBINDEX", "TROPONINI" in the last 168 hours. BNP: BNP (last 3 results) Recent Labs    06/20/21 1034  BNP 151.3*    ProBNP (last 3 results) No results for input(s): "PROBNP" in the last 8760 hours.  CBG: No results for input(s): "GLUCAP" in the last 168 hours.     Signed:  Nita Sells MD   [facility] 06/11/2022, 9:09 AM

## 2022-06-11 NOTE — Discharge Instructions (Addendum)
Wet to Dry WOUND CARE: - Change dressing daily - Supplies: sterile saline, kerlex, scissors, ABD pads, tape  Remove dressing and all packing carefully, moistening with sterile saline as needed to avoid packing/internal dressing sticking to the wound. 2.   Clean edges of skin around the wound with water/gauze, making sure there is no tape debris or leakage left on skin that could cause skin irritation or breakdown. 3.   Dampen and clean kerlex with sterile saline and pack wound from wound base to skin level, making sure to take note of any possible areas of wound tracking, tunneling and packing appropriately. Wound can be packed loosely. Trim kerlex to size if a whole kerlex is not required. 4.   Cover wound with a dry ABD pad and secure with tape.  5.   Write the date/time on the dry dressing/tape to better track when the last dressing change occurred. - apply any skin protectant/powder if recommended by clinician to protect skin/skin folds. - change dressing as needed if leakage occurs, wound gets contaminated, or patient requests to shower. - You may shower daily with wound open and following the shower the wound should be dried and a clean dressing placed.  - Medical grade tape as well as packing supplies can be found at [employer] on [address] or [employer] on [address]. The remaining supplies can be found at your local drug store, [employer] etc.   [facility], [place] [phone]  OPEN ABDOMINAL SURGERY: POST OP INSTRUCTIONS  Always review your discharge instruction sheet given to you by the facility where your surgery was performed.  IF YOU HAVE DISABILITY OR FAMILY LEAVE FORMS, YOU MUST BRING THEM TO THE OFFICE FOR PROCESSING.  PLEASE DO NOT GIVE THEM TO YOUR DOCTOR.  A prescription for pain medication may be given to you upon discharge.  Take your pain medication as prescribed, if needed.  If narcotic pain medicine is not needed, then  you may take acetaminophen (Tylenol) as needed. Take your usually prescribed medications unless otherwise directed. If you need a refill on your pain medication, please contact your pharmacy. They will contact our office to request authorization.  Prescriptions will not be filled after 5pm or on week-ends. You should follow a light diet the first few days after arrival home, such as soup and crackers, pudding, etc.unless your doctor has advised otherwise. A high-fiber, low fat diet can be resumed as tolerated.   Be sure to include lots of fluids daily. Most patients will experience some swelling and bruising on the chest and neck area.  Ice packs will help.  Swelling and bruising can take several days to resolve Most patients will experience some swelling and bruising in the area of the incision. Ice pack will help. Swelling and bruising can take several days to resolve..  It is common to experience some constipation if taking pain medication after surgery.  Increasing fluid intake and taking a stool softener will usually help or prevent this problem from occurring.  A mild laxative (Milk of Magnesia or Miralax) should be taken according to package directions if there are no bowel movements after 48 hours.  Follow wound care instructions as above. You may shower and replace the bandage daily. Please do not take a bath/submerge in water until cleared at your follow up appointment.  ACTIVITIES:  You may resume regular (light) daily activities beginning the next day--such as daily self-care, walking, climbing stairs--gradually increasing activities as  tolerated.  You may have sexual intercourse when it is comfortable.  Refrain from any heavy lifting or straining until approved by your doctor. Please do not lift greater than 10lbs before 07/15/22 unless told otherwise by your surgeon.  You may drive when you no longer are taking prescription pain medication, you can comfortably wear a seatbelt, and you can safely  maneuver your car and apply brakes You should see your doctor in the office for a follow-up appointment approximately two weeks after your surgery.  Make sure that you call for this appointment within a day or two after you arrive home to insure a convenient appointment time.  WHEN TO CALL YOUR DOCTOR: Fever over 101.0 Inability to urinate Nausea and/or vomiting Extreme swelling or bruising Continued bleeding from incision. Increased pain, redness, or drainage from the incision. Difficulty swallowing or breathing Muscle cramping or spasms. Numbness or tingling in hands or feet or around lips.  The clinic staff is available to answer your questions during regular business hours.  Please don't hesitate to call and ask to speak to one of the nurses if you have concerns.  For further questions, please visit [email or web address]

## 2022-06-12 DIAGNOSIS — M109 Gout, unspecified: Secondary | ICD-10-CM | POA: Diagnosis not present

## 2022-06-12 DIAGNOSIS — M545 Low back pain, unspecified: Secondary | ICD-10-CM | POA: Diagnosis not present

## 2022-06-12 DIAGNOSIS — K91840 Postprocedural hemorrhage and hematoma of a digestive system organ or structure following a digestive system procedure: Secondary | ICD-10-CM | POA: Diagnosis not present

## 2022-06-12 DIAGNOSIS — M438X6 Other specified deforming dorsopathies, lumbar region: Secondary | ICD-10-CM | POA: Diagnosis not present

## 2022-06-12 DIAGNOSIS — I480 Paroxysmal atrial fibrillation: Secondary | ICD-10-CM | POA: Diagnosis not present

## 2022-06-12 DIAGNOSIS — M48061 Spinal stenosis, lumbar region without neurogenic claudication: Secondary | ICD-10-CM | POA: Diagnosis not present

## 2022-06-12 DIAGNOSIS — I119 Hypertensive heart disease without heart failure: Secondary | ICD-10-CM | POA: Diagnosis not present

## 2022-06-12 DIAGNOSIS — J383 Other diseases of vocal cords: Secondary | ICD-10-CM | POA: Diagnosis not present

## 2022-06-12 DIAGNOSIS — K769 Liver disease, unspecified: Secondary | ICD-10-CM | POA: Diagnosis not present

## 2022-06-17 ENCOUNTER — Telehealth: Payer: Self-pay | Admitting: Cardiovascular Disease

## 2022-06-17 NOTE — Telephone Encounter (Signed)
Sharyon Cable, Home Health Nurse called and wanted to ask about the parameters of the medication propranolol (INDERAL) 10 MG tablet

## 2022-06-18 DIAGNOSIS — K5792 Diverticulitis of intestine, part unspecified, without perforation or abscess without bleeding: Secondary | ICD-10-CM | POA: Diagnosis not present

## 2022-06-18 DIAGNOSIS — N289 Disorder of kidney and ureter, unspecified: Secondary | ICD-10-CM | POA: Diagnosis not present

## 2022-06-18 DIAGNOSIS — D72828 Other elevated white blood cell count: Secondary | ICD-10-CM | POA: Diagnosis not present

## 2022-06-18 DIAGNOSIS — Z9049 Acquired absence of other specified parts of digestive tract: Secondary | ICD-10-CM | POA: Diagnosis not present

## 2022-06-18 DIAGNOSIS — I480 Paroxysmal atrial fibrillation: Secondary | ICD-10-CM | POA: Diagnosis not present

## 2022-06-18 DIAGNOSIS — D6869 Other thrombophilia: Secondary | ICD-10-CM | POA: Diagnosis not present

## 2022-06-18 DIAGNOSIS — Z7901 Long term (current) use of anticoagulants: Secondary | ICD-10-CM | POA: Diagnosis not present

## 2022-06-18 NOTE — Telephone Encounter (Signed)
Beth returned call, gave order parameters for Propranolol. States patient knew this, but they had to verify for their documentation.

## 2022-06-18 NOTE — Telephone Encounter (Signed)
Per last OV on 04/06/22:  2.  A-Flutter / Fibrillation -   she presents with recurrent atrial fibrillation today.  She could tell that her heart rate is fast.  She is not especially symptomatic. We will increase her metoprolol to 50 mg twice a day at least until she is cardioverted.  At that time we can decide based on her heart rate whether to continue metoprolol 50 mg twice daily or reduce her back down to metoprolol 25 mg a day. I will also give her some propranolol to take on an as-needed basis if she needs rapid acting beta-blocker. She has been intolerant to amiodarone in the past. I discussed the case with Dr. Curt Bears.  He thinks that she would be a good candidate for redo ablation. They apparently have new equipment/new catheters and current ablations are quite successful.  Per telephone note on 05/20/22:  Pt states current HR is 85.  She denies current CP, SOB or dizziness.  She is taking Xarelto and Metoprolol and has Propranolol to take for HR greater than 120.  She is scheduled to see Dr Curt Bears 05/24/2022 for discussion of ablation.   Returned call to [place], left message asking her to call back for clarification on her question regarding Propranolol parameters. Dr Acie Fredrickson provides this to Mrs Nass to take only as needed if HR greater than 120 and typically, advises up to four times daily if needed, but to monitor pressure before repeating dose. Will relay this info upon return call to office.

## 2022-06-24 ENCOUNTER — Encounter: Payer: Self-pay | Admitting: Cardiovascular Disease

## 2022-06-24 NOTE — Progress Notes (Unsigned)
Cardiology Office Note     ID:  Yolanda Bell, Yolanda Bell 02-12-1941, MRN [id number]  PCP:  Faustino Congress, NP  Cardiologist:   Mertie Moores, MD   Chief Complaint  Patient presents with   Atrial Fibrillation   1.  Constrictive Pericarditis 2.  A-Flutter / Fibrillation 3.  Gout 4.  Spastic dysphonia 5. SVT 6. HTN 7.  Diverticulitis 8.  L renal mass - ? Renal cell carcinoma     She has done well from a cardiac standpoint.  She has had a flare up of gout which has affected both feet.   She has been taking indocin for the flare ups.    She had a bad accident in February. She was getting off an airplane and tripped on the off ramp in [place].  She broke both shoulders, left thumb and had soft tissue injury to her left foot.  She was treated conservatively (bilateral arm slings).   Her left thumb fracture was not found until she returned to [place].   She had some tachypalpitations for 22 hours during her recovery.  Her HR was 130-140 for 22 hours and resolved spontaneously.   I suspect she had a brief episode of atrial flutter.  She was quite emotional when she was telling me the issues of her fall. It is  apparently still is quite upsetting to her.  October 19, 2012: She presents today for further evaluation of atrial fib / flutter that was found at cardiac rehab.  She really cannot tell that her HR is fast and irregular.   She can feel her HR when she is lying down.  She is asymptomatic when she is up and busy doing chores.  She denies any chest pain or dyspnea.   Feb. 4, 2014: Yolanda Bell seems to have rapid atrial fibrillation. [name] on relatively high dose of metoprolol XL and also is on when necessary propranolol. He is tolerating the atrial ablation but certainly does not feel as well as she would like to. [name] here to schedule her cardioversion next week. She denies any angina. She does have some shortness of breath.  January 01, 2013:  She has maintained NSR since her  cardioversion ( Feb. 11, 2014) .   She was quite frustrated with her recent Out patient area / endoscopy department  cardioversion.  Lots of computer issues / scheduling issues.    She has done well from a cardiac standpoint.    Sept. 22, 2014:  She has done well.   She had some redness and tenderness just below her right eye.  She is not having any cardiac problems.     January 14, 2014:  She has done well.  She had a brief episode of tachycardia that lasted for several days.    Sounds like an episode of SVT.  She tried some propranolol and eventually resolved after about 5-6 hours.    June 07, 2014:  Yolanda Bell presents with several weeks of palpitations. She came in today and was noted to be in atrial fibrillation. She started back on her xarelto 4 days ago. Her HR is well controlled  Oct. 5, 2015:  Yolanda Bell had a cardioversion August 31.  She had a brief episode of irregular Hr  - she took some propraoolol.  .  She thinks that she eventually converted back to NSR.    In looking back , these were probably PVCs.   May , 10 , 2016;   Yolanda Bell is a  81 y.o. female who presents for follow up of her atrial fib She was cardioverted last august.   She was found to have a few RBCs in a UA - no obvious blood   Nov. 7, 2016:  Doing well from a cardiac standpoint.  No CP , no dyspnea.   Feb 26, 2016: Feeling well  Needs laser surgery on her right eye.  Doing well.   Feb.  6, 2019: Doing well from a cardiac standpoint.   Has some bleeding folowing oral surgery .  She bled for 4 days.   Only issue is her spastic dysphonia   Feb. 10, 2020: On Jan. 20, 2020, she developed and irregular HR .   took Metoprolol 50 PO .   Felt better after several hours.   Has sinus brady with PACs on ECG  .  Is on Xarelto .  Several days later developed a brief pinching sensation in her chest    November 21, 2019: Yolanda Bell is seen today for follow-up visit.  She has a history of atrial fibrillation and is  status post ablation.  She is on Xarelto. She has been doing well.  She is not had any symptoms of Covid.  She received her second vaccine this past Monday. Has been on torsemide.   She did not respond well to furosemide She is going to a casino in [place] tomorrow. By plane   December 15, 2020 Yolanda Bell is doing well.   Having some knee issues.  No cardiac issues  Is in normal rhythm  Fell during a dog walking accident  And has a compression fx of a vertebra Has healed up now ,  Still having some buttock pain   Jan. 9, 2023 Yolanda Bell is seen for follow up of her atrial fib - s/p ablation. On xarelto She had a vasovagal syncope,  (fell in a chair,  broke the chair, ) following her covid booster in Sept. Fell on her buttocks,  had a large hematoma - Hb fell from 11 to 7.4 Received 2u PRBC. Yolanda Bell was held  Echo shows EF of 50-55%  April 06, 2022: Yolanda Bell is seen today for a working visit.  She called the office yesterday complaining of a fast heart rate.  Heart rates been fast for the last day or so.  She has a history of atrial flutter/atrial fibrillation many years ago.  She has been on  Xarelto 20 mg a day since that time.  Has a hx of Afib ablation in 2017 or 2018 [facility])  She needs to have some oral surgery performed in September.  In the past she sees she has had similar oral surgery and had lots of bleeding complications when her Xarelto was started on the day immediately after her procedure.  I have recommended that we hold her Xarelto for another day or so to allow for the surgical site to heal up before we restart Xarelto.   Sept. 15, 2023 Yolanda Bell is seen today for follow up of her atrial fib  Had COVID Aug. 9 Has had COVID toe and COVID black foot  Went to the ER  Venous duplex looked ok  Then had gout  Then developed diverticulitis  Had surgery  - had partial resection of small bowel  No colostomy  Incision is still open, closing by secondary intention  The hospitalists  held her metoprolol and spiro for several days She has restarted the metoprolol XL 25 mg twice a day  And has  restarted the spiro 50 mg a day   CT of the abdomen found a 16 mm nodule in the Left kidney - c/w Renal cell carcinoma   Has missed her appt with [facility] - has been rescheduled  Missed her appt to get tooth pulled. - Ive encouraged her to reschedule it as soon as possible. She has healed up from her abdominal surgery to have this tooth pulled.             Past Medical History:  Diagnosis Date   Atrial fibrillation ([place])    Status post TEE cardioversion   Constrictive pericarditis    2 status post pericardial stripping at the [facility]   Gout    Hypertension    Spastic dysphonia    Supraventricular tachycardia ([place])    Status post RF ablation by Dr. Peterson Lombard    Past Surgical History:  Procedure Laterality Date   [place] N/A 06/03/2022   Procedure: SMALL BOWEL RESECTION;  Surgeon: Greer Pickerel, MD;  Location: [place];  Service: General;  Laterality: N/A;   CARDIOVERSION  11/21/2012   Procedure: CARDIOVERSION;  Surgeon: Darlin Coco, MD;  Location: [place];  Service: Cardiovascular;;   CARDIOVERSION N/A 06/10/2014   Procedure: CARDIOVERSION;  Surgeon: Thayer Headings, MD;  Location: [place];  Service: Cardiovascular;  Laterality: N/A;   CARDIOVERSION N/A 10/14/2015   Procedure: CARDIOVERSION;  Surgeon: Josue Hector, MD;  Location: [facility] ENDOSCOPY;  Service: Cardiovascular;  Laterality: N/A;   CARDIOVERSION N/A 05/12/2016   Procedure: CARDIOVERSION;  Surgeon: Skeet Latch, MD;  Location: [facility] ENDOSCOPY;  Service: Cardiovascular;  Laterality: N/A;   CARDIOVERSION N/A 04/09/2022   Procedure: CARDIOVERSION;  Surgeon: Elouise Munroe, MD;  Location: [place];  Service: Cardiovascular;  Laterality: N/A;   [place] N/A 08/20/2016   Procedure: Atrial Fibrillation  Ablation;  Surgeon: [name], MD;  Location: [place] CV LAB;  Service: Cardiovascular;  Laterality: N/A;   EYE SURGERY     cataracts   LAPAROTOMY N/A 06/03/2022   Procedure: EXPLORATORY LAPAROTOMY;  Surgeon: Greer Pickerel, MD;  Location: [place];  Service: General;  Laterality: N/A;   PERICARDIECTOMY     TEE WITHOUT CARDIOVERSION N/A 06/10/2014   Procedure: TRANSESOPHAGEAL ECHOCARDIOGRAM (TEE);  Surgeon: Thayer Headings, MD;  Location: [facility] ENDOSCOPY;  Service: Cardiovascular;  Laterality: N/A;   TONSILLECTOMY       Current Outpatient Medications  Medication Sig Dispense Refill   Ascorbic Acid (VITAMIN C PO) Take 1 tablet by mouth daily.     Calcium Carb-Cholecalciferol (CALCIUM + VITAMIN D3 PO) Take 1 tablet by mouth daily.     DENTA 5000 PLUS 1.1 % CREA dental cream Place 1 application  onto teeth 2 (two) times daily.     metoprolol succinate (TOPROL-XL) 25 MG 24 hr tablet Take one tablet by mouth (25 mg) twice a day. 180 tablet 3   rivaroxaban (XARELTO) 20 MG TABS tablet Take 1 tablet (20 mg total) by mouth daily with supper. 30 tablet 0   spironolactone (ALDACTONE) 50 MG tablet TAKE 1 TABLET BY MOUTH EVERY DAY 90 tablet 2   denosumab (PROLIA) 60 MG/ML SOSY injection Inject 60 mg into the skin every 6 (six) months. Last dose End of may early april     oxyCODONE (OXY IR/ROXICODONE) 5 MG immediate release tablet Take 0.5-1 tablets (2.5-5 mg total) by mouth every 6 (six)  hours as needed for breakthrough pain. (Patient not taking: Reported on 06/25/2022) 10 tablet 0   propranolol (INDERAL) 10 MG tablet TAKE 1 TABLET BY MOUTH 4 TIMES A DAY AS NEEDED (Patient not taking: Reported on 06/25/2022) 120 tablet 10   No current facility-administered medications for this visit.    Allergies:   Corticosteroids, Covid-19 (mrna) vaccine, Other, Pacerone [amiodarone], Erythromycin, and Zyloprim [allopurinol]    Social History:  The patient  reports that she has never smoked. She has never used  smokeless tobacco. She reports current alcohol use. She reports that she does not use drugs.   Family History:  The patient's family history includes Breast cancer in her sister; Emphysema in her father; Heart disease in her mother.    ROS: Noted in current history.  Otherwise negative.    Physical Exam: Blood pressure 132/70, pulse 76, height 5' 2.25" (1.581 m), weight 136 lb 9.6 oz (62 kg), SpO2 97 %.       GEN:  Well nourished, well developed in no acute distress HEENT: Normal NECK: No JVD; No carotid bruits LYMPHATICS: No lymphadenopathy CARDIAC: RRR , no murmurs, rubs, gallops RESPIRATORY:  Clear to auscultation without rales, wheezing or rhonchi  ABDOMEN: Soft, non-tender, non-distended MUSCULOSKELETAL:  No edema; No deformity  SKIN: Warm and dry NEUROLOGIC:  Alert and oriented x 3, voice is c/w spastic dysphonia      EKG:        Recent Labs: 06/11/2022: ALT 20; BUN <5; Creatinine, Ser 0.55; Hemoglobin 8.0; Magnesium 1.9; Platelets 215; Potassium 4.0; Sodium 138    Lipid Panel    Component Value Date/Time   CHOL 172 11/21/2019 1035   TRIG 96 11/21/2019 1035   HDL 59 11/21/2019 1035   CHOLHDL 2.9 11/21/2019 1035   CHOLHDL 3.4 08/18/2015 1147   VLDL 17 08/18/2015 1147   LDLCALC 96 11/21/2019 1035      Wt Readings from Last 3 Encounters:  06/25/22 136 lb 9.6 oz (62 kg)  06/03/22 139 lb (63 kg)  05/22/22 137 lb (62.1 kg)      Other studies Reviewed: Additional studies/ records that were reviewed today include: . Review of the above records demonstrates:    ASSESSMENT AND PLAN:  1.  Constrictive Pericarditis -no evidence of recurrence   2.  A-Flutter / Fibrillation -   she presents with recurrent atrial fibrillation today.  She could tell that her heart rate is fast.  She is not especially symptomatic. At some point we will get her to see Dr. Curt Bears.  She missed her recent appointment with Dr. Curt Bears because of hospitalization for diverticulitis.   We will get her rescheduled with [facility] in October.    3.  Renal cell carcinoma: During her hospitalization she had an abdominal CT scan for further evaluation of her diverticulitis.  She was incidentally noted to have a mass that was consistent with renal cell carcinoma of her left kidney.  We will refer her to urology for further evaluation and management.  [phone])  4. Spastic dysphonia -   stable     6. HTN -   well controlled   Current medicines are reviewed at length with the patient today.  The patient does not have concerns regarding medicines.  The following changes have been made:  no change  Labs/ tests ordered today include:   Orders Placed This Encounter  Procedures   Ambulatory referral to Urology     Disposition:        Mertie Moores,  MD  06/25/2022 1:48 PM    [address], [place], [place]  [address] Phone: [phone]; Fax: [phone]

## 2022-06-25 ENCOUNTER — Ambulatory Visit: Payer: Medicare PPO | Attending: Cardiovascular Disease | Admitting: Cardiovascular Disease

## 2022-06-25 ENCOUNTER — Encounter: Payer: Self-pay | Admitting: Cardiovascular Disease

## 2022-06-25 VITALS — BP 132/70 | HR 76 | Ht 62.25 in | Wt 136.6 lb

## 2022-06-25 DIAGNOSIS — N2889 Other specified disorders of kidney and ureter: Secondary | ICD-10-CM

## 2022-06-25 NOTE — Patient Instructions (Signed)
Medication Instructions:  Your physician recommends that you continue on your current medications as directed. Please refer to the Current Medication list given to you today.  *If you need a refill on your cardiac medications before your next appointment, please call your pharmacy*  Lab Work: If you have labs (blood work) drawn today and your tests are completely normal, you will receive your results only by: [place] (if you have MyChart) OR A paper copy in the mail If you have any lab test that is abnormal or we need to change your treatment, we will call you to review the results.  Testing/Procedures: None ordered today.  Follow-Up: At [facility], you and your health needs are our priority.  As part of our continuing mission to provide you with exceptional heart care, we have created designated Provider Care Teams.  These Care Teams include your primary Cardiologist (physician) and Advanced Practice Providers (APPs -  Physician Assistants and Nurse Practitioners) who all work together to provide you with the care you need, when you need it.  We recommend signing up for the patient portal called "MyChart".  Sign up information is provided on this After Visit Summary.  MyChart is used to connect with patients for Virtual Visits (Telemedicine).  Patients are able to view lab/test results, encounter notes, upcoming appointments, etc.  Non-urgent messages can be sent to your provider as well.   To learn more about what you can do with MyChart, go to [email or web address].    Your next appointment:   6 month(s)  The format for your next appointment:   In Person  Provider:   Mertie Moores, MD     You have been referred to Urology.   Important Information About [place]

## 2022-07-07 DIAGNOSIS — D49512 Neoplasm of unspecified behavior of left kidney: Secondary | ICD-10-CM | POA: Diagnosis not present

## 2022-07-08 ENCOUNTER — Other Ambulatory Visit: Payer: Self-pay | Admitting: Urology

## 2022-07-08 DIAGNOSIS — D49512 Neoplasm of unspecified behavior of left kidney: Secondary | ICD-10-CM

## 2022-07-16 ENCOUNTER — Institutional Professional Consult (permissible substitution): Payer: Medicare PPO | Admitting: Cardiology

## 2022-07-20 ENCOUNTER — Telehealth: Payer: Self-pay

## 2022-07-20 NOTE — Patient Outreach (Signed)
  Care Coordination   Initial Visit Note   07/20/2022 Name: LYNELL GREENHOUSE MRN: [id number] DOB: May 28, 1941  Suly Vukelich Vary is a 81 y.o. year old female who sees Faustino Congress, NP for primary care. I spoke with  [name] by phone today.  What matters to the patients health and wellness today?  Patient reports that she is slowly but surely recovering from surgery a few months ago. Wound is almost totally healed. She has ben getting [place] services and will be discharged tomorrow. Patient voices she is able to manage her care on her own. She is a little anxious about upcomming MRI ordered by urologist. She does not lie the idea of lying still for 40 mins to get test done and fearful of closed in areas. Patient will contact MD to see if she can have something ordered to pre-medicate prior to test. She denies any RN CM needs or concerns at this time.     Goals Addressed             This Visit's Progress    COMPLETED: Care Coordination-no follow up required       Care Coordination Interventions: Advised patient to schedule immunization appts-plans to get flu vaccine at pharmacy, had very serious/bad reaction to COVID booster vaccine last year and advised by MD to not get any more, pt completed Annual wellness Visit on 04/26/22 Assessed social determinant of health barriers Advised patient to discus with MD 'pre-medication med' for upcoming MRI appt          SDOH assessments and interventions completed:  Yes  SDOH Interventions Today    Flowsheet Row Most Recent Value  SDOH Interventions   Food Insecurity Interventions Intervention Not Indicated  Transportation Interventions Intervention Not Indicated        Care Coordination Interventions Activated:  Yes  Care Coordination Interventions:  Yes, provided   Follow up plan: No further intervention required.   Encounter Outcome:  Pt. Visit Completed   Enzo Montgomery, RN,BSN,CCM [facility] Telephonic Care  Management Coordinator Direct Phone: [phone] Toll Free: [phone] Fax: [phone]

## 2022-07-23 ENCOUNTER — Ambulatory Visit: Payer: Medicare PPO | Attending: Cardiology | Admitting: Cardiology

## 2022-07-23 ENCOUNTER — Encounter: Payer: Self-pay | Admitting: Cardiology

## 2022-07-23 VITALS — BP 128/72 | HR 76 | Ht 62.25 in | Wt 135.0 lb

## 2022-07-23 DIAGNOSIS — I4819 Other persistent atrial fibrillation: Secondary | ICD-10-CM

## 2022-07-23 DIAGNOSIS — D6869 Other thrombophilia: Secondary | ICD-10-CM

## 2022-07-23 NOTE — Patient Instructions (Signed)
Medication Instructions:  None  *If you need a refill on your cardiac medications before your next appointment, please call your pharmacy*   Lab Work: none If you have labs (blood work) drawn today and your tests are completely normal, you will receive your results only by: [place] (if you have MyChart) OR A paper copy in the mail If you have any lab test that is abnormal or we need to change your treatment, we will call you to review the results.   Testing/Procedures: none   Follow-Up: At [facility], you and your health needs are our priority.  As part of our continuing mission to provide you with exceptional heart care, we have created designated Provider Care Teams.  These Care Teams include your primary Cardiologist (physician) and Advanced Practice Providers (APPs -  Physician Assistants and Nurse Practitioners) who all work together to provide you with the care you need, when you need it.  We recommend signing up for the patient portal called "MyChart".  Sign up information is provided on this After Visit Summary.  MyChart is used to connect with patients for Virtual Visits (Telemedicine).  Patients are able to view lab/test results, encounter notes, upcoming appointments, etc.  Non-urgent messages can be sent to your provider as well.   To learn more about what you can do with MyChart, go to [email or web address].    Your next appointment:   3 month(s)  The format for your next appointment:   In Person  Provider:   You will see one of the following Advanced Practice Providers on your designated Care Team:   Tommye Standard, [place] Legrand Como "Jonni Sanger" Chalmers Cater, [place]      Other Instructions None   Important Information About [place]

## 2022-07-23 NOTE — Progress Notes (Signed)
Electrophysiology Office Note   Date:  07/23/2022   ID:  Adia, Crammer [birth year], MRN [id number]  PCP:  Faustino Congress, NP  Cardiologist:  Nahser Primary Electrophysiologist:  Evona Westra Meredith Leeds, MD    No chief complaint on file.    History of Present Illness: Yolanda Bell is a 81 y.o. female who presents today for electrophysiology evaluation.   She is being referred for evaluation of atrial fibrillation by Dr. Acie Fredrickson.  She has a history significant for constrictive pericarditis status post pericardial stripping in [facility] 2006, SVT post ablation by Dr. Caryl Comes, hypertension, gout, atrial fibrillation/flutter.  She is status post atrial fibrillation ablation in 2017.   Today, denies symptoms of palpitations, chest pain, shortness of breath, orthopnea, PND, lower extremity edema, claudication, dizziness, presyncope, syncope, bleeding, or neurologic sequela. The patient is tolerating medications without difficulties.  She has had an eventful few months.  She had diverticulitis and had surgery.  She also went into atrial fibrillation.  She is status post cardioversion 04/09/2022.  She has not had any further episodes of atrial fibrillation since her cardioversion.  She has felt well without complaint.   Past Medical History:  Diagnosis Date   Atrial fibrillation ([place])    Status post TEE cardioversion   Constrictive pericarditis    2 status post pericardial stripping at the [facility]   Gout    Hypertension    Spastic dysphonia    Supraventricular tachycardia    Status post RF ablation by Dr. Peterson Lombard   Past Surgical History:  Procedure Laterality Date   [place] N/A 06/03/2022   Procedure: SMALL BOWEL RESECTION;  Surgeon: Greer Pickerel, MD;  Location: [place];  Service: General;  Laterality: N/A;   CARDIOVERSION  11/21/2012   Procedure: CARDIOVERSION;  Surgeon: Darlin Coco, MD;  Location: [place];  Service:  Cardiovascular;;   CARDIOVERSION N/A 06/10/2014   Procedure: CARDIOVERSION;  Surgeon: Thayer Headings, MD;  Location: [place];  Service: Cardiovascular;  Laterality: N/A;   CARDIOVERSION N/A 10/14/2015   Procedure: CARDIOVERSION;  Surgeon: Josue Hector, MD;  Location: [facility] ENDOSCOPY;  Service: Cardiovascular;  Laterality: N/A;   CARDIOVERSION N/A 05/12/2016   Procedure: CARDIOVERSION;  Surgeon: Skeet Latch, MD;  Location: [facility] ENDOSCOPY;  Service: Cardiovascular;  Laterality: N/A;   CARDIOVERSION N/A 04/09/2022   Procedure: CARDIOVERSION;  Surgeon: Elouise Munroe, MD;  Location: [place];  Service: Cardiovascular;  Laterality: N/A;   [place] N/A 08/20/2016   Procedure: Atrial Fibrillation Ablation;  Surgeon: Dajuana Palen Meredith Leeds, MD;  Location: [place] CV LAB;  Service: Cardiovascular;  Laterality: N/A;   EYE SURGERY     cataracts   LAPAROTOMY N/A 06/03/2022   Procedure: EXPLORATORY LAPAROTOMY;  Surgeon: Greer Pickerel, MD;  Location: [place];  Service: General;  Laterality: N/A;   PERICARDIECTOMY     TEE WITHOUT CARDIOVERSION N/A 06/10/2014   Procedure: TRANSESOPHAGEAL ECHOCARDIOGRAM (TEE);  Surgeon: Thayer Headings, MD;  Location: [facility] ENDOSCOPY;  Service: Cardiovascular;  Laterality: N/A;   TONSILLECTOMY       Current Outpatient Medications  Medication Sig Dispense Refill   Ascorbic Acid (VITAMIN C PO) Take 1 tablet by mouth daily.     Calcium Carb-Cholecalciferol (CALCIUM + VITAMIN D3 PO) Take 1 tablet by mouth daily.     denosumab (PROLIA) 60 MG/ML SOSY injection Inject 60 mg into the skin every 6 (  six) months.     DENTA 5000 PLUS 1.1 % CREA dental cream Place 1 application  onto teeth 2 (two) times daily.     fluticasone (FLONASE) 50 MCG/ACT nasal spray Place into both nostrils.     metoprolol succinate (TOPROL-XL) 25 MG 24 hr tablet Take one tablet by mouth (25 mg) twice a day. 180 tablet 3   propranolol (INDERAL) 10 MG tablet  TAKE 1 TABLET BY MOUTH 4 TIMES A DAY AS NEEDED 120 tablet 10   rivaroxaban (XARELTO) 20 MG TABS tablet Take 1 tablet (20 mg total) by mouth daily with supper. 30 tablet 0   spironolactone (ALDACTONE) 50 MG tablet TAKE 1 TABLET BY MOUTH EVERY DAY 90 tablet 2   No current facility-administered medications for this visit.    Allergies:   Corticosteroids, Covid-19 (mrna) vaccine, Other, Pacerone [amiodarone], Erythromycin, and Zyloprim [allopurinol]   Social History:  The patient  reports that she has never smoked. She has never used smokeless tobacco. She reports current alcohol use. She reports that she does not use drugs.   Family History:  The patient's family history includes Breast cancer in her sister; Emphysema in her father; Heart disease in her mother.   ROS:  Please see the history of present illness.   Otherwise, review of systems is positive for none.   All other systems are reviewed and negative.   PHYSICAL EXAM: VS:  BP 128/72   Pulse 76   Ht 5' 2.25" (1.581 m)   Wt 135 lb (61.2 kg)   SpO2 98%   BMI 24.49 kg/m  , BMI Body mass index is 24.49 kg/m. GEN: Well nourished, well developed, in no acute distress  HEENT: normal  Neck: no JVD, carotid bruits, or masses Cardiac: RRR; no murmurs, rubs, or gallops,no edema  Respiratory:  clear to auscultation bilaterally, normal work of breathing GI: soft, nontender, nondistended, + BS MS: no deformity or atrophy  Skin: warm and dry Neuro:  Strength and sensation are intact Psych: euthymic mood, full affect  EKG:  EKG is ordered today. Personal review of the ekg ordered shows sinus rhythm    Recent Labs: 06/11/2022: ALT 20; BUN <5; Creatinine, Ser 0.55; Hemoglobin 8.0; Magnesium 1.9; Platelets 215; Potassium 4.0; Sodium 138    Lipid Panel     Component Value Date/Time   CHOL 172 11/21/2019 1035   TRIG 96 11/21/2019 1035   HDL 59 11/21/2019 1035   CHOLHDL 2.9 11/21/2019 1035   CHOLHDL 3.4 08/18/2015 1147   VLDL 17  08/18/2015 1147   LDLCALC 96 11/21/2019 1035     Wt Readings from Last 3 Encounters:  07/23/22 135 lb (61.2 kg)  06/25/22 136 lb 9.6 oz (62 kg)  06/03/22 139 lb (63 kg)      Other studies Reviewed: Additional studies/ records that were reviewed today include: TEE/cardioversion Review of the above records today demonstrates:  - Left ventricle: Systolic function was mildly reduced. The   estimated ejection fraction was in the range of 45% to 50%. No   evidence of thrombus. - Mitral valve: There was moderate regurgitation. - Left atrium: No evidence of thrombus in the atrial cavity or   appendage. - Tricuspid valve: There was mild-moderate regurgitation.  Impressions:  - Successful cardioversion. No cardiac source of emboli was   indentified.   TTE 05/18/16 - Low normal LV systolic function; grade 1 diastolic dysfunction;   mild MR; mild LAE.  ASSESSMENT AND PLAN:  1.  Persistent atrial fibrillation: Currently  on Xarelto 20 mg daily.  Status post ablation 08/20/2016.  CHA2DS2-VASc of 4.  She is unfortunately had more episodes of atrial fibrillation.  She has had 1 episode over the last 5-1/2 years.  At this point, she would prefer to avoid further therapy if possible.  If she does go back into atrial fibrillation, she would be amenable to ablation.  For now, we Emerson Barretto hold off on therapy adjustments.  2.  Constrictive pericarditis: Status post pericardial stripping at the [facility]  3.  SVT: Status post ablation by Dr. Caryl Comes  4.  Hypertension: Currently well controlled  5.  Secondary hypercoagulable state: Currently on Xarelto for atrial fibrillation as above   Current medicines are reviewed at length with the patient today.   The patient does not have concerns regarding her medicines.  The following changes were made today: None  Labs/ tests ordered today include:  Orders Placed This Encounter  Procedures   EKG 12-Lead     Disposition:   FU 3  months  Signed, Kerly Rigsbee Meredith Leeds, MD  07/23/2022 12:02 PM     [place] [address] [place] [phone] (office) [phone] (fax)

## 2022-08-09 ENCOUNTER — Ambulatory Visit
Admission: RE | Admit: 2022-08-09 | Discharge: 2022-08-09 | Disposition: A | Discharge status: Home / Self Care | Payer: Medicare PPO | Source: Ambulatory Visit | Attending: Urology | Admitting: Urology

## 2022-08-09 DIAGNOSIS — D49512 Neoplasm of unspecified behavior of left kidney: Secondary | ICD-10-CM

## 2022-08-09 DIAGNOSIS — K5730 Diverticulosis of large intestine without perforation or abscess without bleeding: Secondary | ICD-10-CM | POA: Diagnosis not present

## 2022-08-09 DIAGNOSIS — N2889 Other specified disorders of kidney and ureter: Secondary | ICD-10-CM | POA: Diagnosis not present

## 2022-08-09 MED ORDER — GADOPICLENOL 0.5 MMOL/ML IV SOLN
6.0000 mL | Freq: Once | INTRAVENOUS | Status: AC | PRN
  Administered 2022-08-09: 6 mL via INTRAVENOUS

## 2022-08-17 DIAGNOSIS — D49512 Neoplasm of unspecified behavior of left kidney: Secondary | ICD-10-CM | POA: Diagnosis not present

## 2022-09-16 DIAGNOSIS — M810 Age-related osteoporosis without current pathological fracture: Secondary | ICD-10-CM | POA: Diagnosis not present

## 2022-10-12 NOTE — Progress Notes (Signed)
PCP:  Faustino Congress, NP Primary Cardiologist: Mertie Moores, MD Electrophysiologist: Constance Haw, MD   Yolanda Bell is a 82 y.o. female seen today for Yolanda Meredith Leeds, MD for routine electrophysiology followup. Since last being seen in our clinic the patient reports doing well overall. She has not felt any breakthrough AF since cardioversion in June.  she denies chest pain, palpitations, dyspnea, PND, orthopnea, nausea, vomiting, dizziness, syncope, edema, weight gain, or early satiety.   Past Medical History:  Diagnosis Date   Atrial fibrillation ([place])    Status post TEE cardioversion   Constrictive pericarditis    2 [facility]   Gout    Hypertension    Spastic dysphonia    Supraventricular tachycardia    Status post RF ablation by Dr. Peterson Lombard   Past Surgical History:  Procedure Laterality Date   [place] N/A 06/03/2022   Procedure: SMALL BOWEL RESECTION;  Surgeon: Greer Pickerel, MD;  Location: [place];  Service: General;  Laterality: N/A;   CARDIOVERSION  11/21/2012   Procedure: CARDIOVERSION;  Surgeon: Darlin Coco, MD;  Location: [place];  Service: Cardiovascular;;   CARDIOVERSION N/A 06/10/2014   Procedure: CARDIOVERSION;  Surgeon: Thayer Headings, MD;  Location: [place];  Service: Cardiovascular;  Laterality: N/A;   CARDIOVERSION N/A 10/14/2015   Procedure: CARDIOVERSION;  Surgeon: Josue Hector, MD;  Location: [facility] ENDOSCOPY;  Service: Cardiovascular;  Laterality: N/A;   CARDIOVERSION N/A 05/12/2016   Procedure: CARDIOVERSION;  Surgeon: Skeet Latch, MD;  Location: [facility] ENDOSCOPY;  Service: Cardiovascular;  Laterality: N/A;   CARDIOVERSION N/A 04/09/2022   Procedure: CARDIOVERSION;  Surgeon: Elouise Munroe, MD;  Location: [place];  Service: Cardiovascular;  Laterality: N/A;   [place] N/A 08/20/2016    Procedure: Atrial Fibrillation Ablation;  Surgeon: Yolanda Meredith Leeds, MD;  Location: [place] CV LAB;  Service: Cardiovascular;  Laterality: N/A;   EYE SURGERY     cataracts   LAPAROTOMY N/A 06/03/2022   Procedure: EXPLORATORY LAPAROTOMY;  Surgeon: Greer Pickerel, MD;  Location: [place];  Service: General;  Laterality: N/A;   PERICARDIECTOMY     TEE WITHOUT CARDIOVERSION N/A 06/10/2014   Procedure: TRANSESOPHAGEAL ECHOCARDIOGRAM (TEE);  Surgeon: Thayer Headings, MD;  Location: [place] ENDOSCOPY;  Service: Cardiovascular;  Laterality: N/A;   TONSILLECTOMY      Current Outpatient Medications  Medication Sig Dispense Refill   Ascorbic Acid (VITAMIN C PO) Take 1 tablet by mouth daily.     Calcium Carb-Cholecalciferol (CALCIUM + VITAMIN D3 PO) Take 1 tablet by mouth daily.     denosumab (PROLIA) 60 MG/ML SOSY injection Inject 60 mg into the skin every 6 (six) months.     DENTA 5000 PLUS 1.1 % CREA dental cream Place 1 application  onto teeth 2 (two) times daily.     metoprolol succinate (TOPROL-XL) 25 MG 24 hr tablet Take one tablet by mouth (25 mg) twice a day. 180 tablet 3   propranolol (INDERAL) 10 MG tablet TAKE 1 TABLET BY MOUTH 4 TIMES A DAY AS NEEDED 120 tablet 10   rivaroxaban (XARELTO) 20 MG TABS tablet Take 1 tablet (20 mg total) by mouth daily with supper. 30 tablet 0   spironolactone (ALDACTONE) 50 MG tablet TAKE 1 TABLET BY MOUTH EVERY DAY 90 tablet 2   fluticasone (FLONASE) 50 MCG/ACT nasal spray Place into both  nostrils.     No current facility-administered medications for this visit.    Allergies  Allergen Reactions   Corticosteroids Other (See Comments)    Liver scarring as a child   Covid-19 (Mrna) Vaccine Other (See Comments)    Postural Orthostatic Tachycardia Syndrome (POTS)   Other Other (See Comments)    Cannot take STEROIDS-unknown reaction-caused by internal scarring on liver.    Anesthesia precaution due to excessive liver scarring    Pacerone [Amiodarone] Other  (See Comments)    Dizziness Visual hallucinations   Erythromycin Nausea And Vomiting and Rash   Zyloprim [Allopurinol] Rash and Other (See Comments)    Skin peeling More severe gout     Social History   Socioeconomic History   Marital status: Divorced    Spouse name: Not on file   Number of children: Not on file   Years of education: Not on file   Highest education level: Not on file  Occupational History   Not on file  Tobacco Use   Smoking status: Never   Smokeless tobacco: Never  Vaping Use   Vaping Use: Never used  Substance and Sexual Activity   Alcohol use: Yes    Comment: occasionally   Drug use: No   Sexual activity: Not on file  Other Topics Concern   Not on file  Social History Narrative   Not on file   Social Determinants of Health   Financial Resource Strain: Not on file  Food Insecurity: No Food Insecurity (07/20/2022)   Hunger Vital Sign    Worried About Running Out of Food in the Last Year: Never true    Ran Out of Food in the Last Year: Never true  Transportation Needs: No Transportation Needs (07/20/2022)   PRAPARE - Hydrologist (Medical): No    Lack of Transportation (Non-Medical): No  Physical Activity: Not on file  Stress: Not on file  Social Connections: Not on file  Intimate Partner Violence: Not on file     Review of Systems: All other systems reviewed and are otherwise negative except as noted above.  Physical Exam: Vitals:   10/20/22 1103  BP: 132/64  Pulse: 65  SpO2: 99%  Weight: 136 lb 3.2 oz (61.8 kg)  Height: 5' 2.25" (1.581 m)    GEN- The patient is well appearing, alert and oriented x 3 today.   HEENT: normocephalic, atraumatic; sclera clear, conjunctiva pink; hearing intact; oropharynx clear; neck supple, no JVP Lymph- no cervical lymphadenopathy Lungs- Clear to ausculation bilaterally, normal work of breathing.  No wheezes, rales, rhonchi Heart- Regular rate and rhythm, no murmurs, rubs  or gallops, PMI not laterally displaced GI- soft, non-tender, non-distended, bowel sounds present, no hepatosplenomegaly Extremities- No peripheral edema. no clubbing or cyanosis; DP/PT/radial pulses 2+ bilaterally MS- no significant deformity or atrophy Skin- warm and dry, no rash or lesion Psych- euthymic mood, full affect Neuro- strength and sensation are intact  EKG is not ordered.   Additional studies reviewed include: Previous EP notes.   Assessment and Plan:  1.  Persistent atrial fibrillation: She has not felt any further AF since june Continue on Xarelto 20 mg daily. CHA2DS2-VASc of at least 4 Status post ablation 08/20/2016.   Burden very low. She would prefer to continue to hold off on re-do ablation unless burden increases No indication for re-do at this juncture.    2.  Constrictive pericarditis:  Status post pericardial stripping at the [facility].  3.  SVT: Status post ablation by Dr. Caryl Comes. Stable.   4.  HTN Stable on current regimen    5.  Secondary hypercoagulable state:  Currently on Xarelto for atrial fibrillation as above  6. Arthritis She says she Yolanda eventually need knee replacement. Yolanda likely be at low to moderate risk from cardiac perspective but able to proceed when a more clear plan is in place.   Follow up with Dr. Curt Bears in 6 months (In September to having alternating 6 mo visits with gen cards and EP)  Shirley Friar, PA-C  10/20/22 11:15 AM

## 2022-10-20 ENCOUNTER — Encounter: Payer: Self-pay | Admitting: Student

## 2022-10-20 ENCOUNTER — Ambulatory Visit: Payer: Medicare PPO | Attending: Student | Admitting: Student

## 2022-10-20 VITALS — BP 132/64 | HR 65 | Ht 62.25 in | Wt 136.2 lb

## 2022-10-20 DIAGNOSIS — I4819 Other persistent atrial fibrillation: Secondary | ICD-10-CM | POA: Diagnosis not present

## 2022-10-20 DIAGNOSIS — I10 Essential (primary) hypertension: Secondary | ICD-10-CM

## 2022-10-20 DIAGNOSIS — I471 Supraventricular tachycardia, unspecified: Secondary | ICD-10-CM | POA: Diagnosis not present

## 2022-10-20 DIAGNOSIS — I311 Chronic constrictive pericarditis: Secondary | ICD-10-CM | POA: Diagnosis not present

## 2022-10-20 NOTE — Patient Instructions (Signed)
Medication Instructions:  Your physician recommends that you continue on your current medications as directed. Please refer to the Current Medication list given to you today.  *If you need a refill on your cardiac medications before your next appointment, please call your pharmacy*   Lab Work: TODAY: BMET, CBC  If you have labs (blood work) drawn today and your tests are completely normal, you will receive your results only by: [place] (if you have MyChart) OR A paper copy in the mail If you have any lab test that is abnormal or we need to change your treatment, we will call you to review the results.   Follow-Up: At [facility], you and your health needs are our priority.  As part of our continuing mission to provide you with exceptional heart care, we have created designated Provider Care Teams.  These Care Teams include your primary Cardiologist (physician) and Advanced Practice Providers (APPs -  Physician Assistants and Nurse Practitioners) who all work together to provide you with the care you need, when you need it.   Your next appointment:   6 month(s)  The format for your next appointment:   In Person  Provider:   Allegra Lai, MD     Important Information About Sugar

## 2022-10-21 LAB — BASIC METABOLIC PANEL
BUN/Creatinine Ratio: 12 (ref 12–28)
BUN: 9 mg/dL (ref 8–27)
CO2: 22 mmol/L (ref 20–29)
Calcium: 9.2 mg/dL (ref 8.7–10.3)
Chloride: 100 mmol/L (ref 96–106)
Creatinine, Ser: 0.78 mg/dL (ref 0.57–1.00)
Glucose: 94 mg/dL (ref 70–99)
Potassium: 4.1 mmol/L (ref 3.5–5.2)
Sodium: 137 mmol/L (ref 134–144)
eGFR: 77 mL/min/{1.73_m2} (ref >59)

## 2022-10-21 LAB — CBC
Hematocrit: 37.2 % (ref 34.0–46.6)
Hemoglobin: 12.5 g/dL (ref 11.1–15.9)
MCH: 29.5 pg (ref 26.6–33.0)
MCHC: 33.6 g/dL (ref 31.5–35.7)
MCV: 88 fL (ref 79–97)
Platelets: 193 10*3/uL (ref 150–450)
RBC: 4.24 x10E6/uL (ref 3.77–5.28)
RDW: 14.5 % (ref 11.7–15.4)
WBC: 5.5 10*3/uL (ref 3.4–10.8)

## 2022-11-15 ENCOUNTER — Telehealth: Payer: Self-pay | Admitting: Cardiovascular Disease

## 2022-11-15 NOTE — Telephone Encounter (Signed)
Pt states on Friday night, she got up to use the bathroom and her hr seemed very fast. She states in the morning she took her medications as normal but she was lightheaded. She took her bp after and it was 84/62 hr 155. She states all she did was lay around that day. She was hesitant on taking propanolol (which she was told to take when her hr is high), she went ahead and took it twice on Saturday, at 12:30pm and 6:30pm. She states her hr stabilized, but Sunday night  she took her night medications and her hr went down to 43. This morning her bp and hr are normal - 114/ 54 hr 62. She states she feels fine, asymptomatic but she wanted to make Dr. Acie Fredrickson aware of her readings over the weekend.

## 2022-11-15 NOTE — Telephone Encounter (Signed)
Spoke with the patient who reports elevated heart rate on Saturday up at 155. She did end up taking two propanolol about 6 hours apart. She states that she was having some lightheadedness. She could feel her heart racing but she could tell that she was not in AFIB. She states that she rested and monitored her vitas the remainder of the day and HR came down. Blood pressure also normalized. She has felt fine ever since but did notice last night that her heart rate was 43. She was not symptomatic. She feels good today and BP/HR are normal. She will continue to monitor.

## 2022-11-17 NOTE — Telephone Encounter (Signed)
Nahser, Wonda Cheng, MD  You13 hours ago (8:03 PM)   If these have continued, I would like to place a 14 day monitor.  She has a hx of atrial flutter   PN   Returned call to patient who states she has not had anymore episodes of tachycardia. States her BP and heart rate have been normal and she feels great. She will call us back if this recurs and we will do monitor at that time.

## 2022-11-18 ENCOUNTER — Other Ambulatory Visit: Payer: Self-pay | Admitting: Cardiovascular Disease

## 2022-11-26 ENCOUNTER — Other Ambulatory Visit: Payer: Self-pay | Admitting: Cardiovascular Disease

## 2022-11-26 DIAGNOSIS — I480 Paroxysmal atrial fibrillation: Secondary | ICD-10-CM

## 2022-11-26 NOTE — Telephone Encounter (Signed)
Prescription refill request for Xarelto received.   Indication: afib  Last office visit: [place], 10/20/2022 Weight:61.8 kg  Age: 82 yo  Scr: 0.78, 10/20/2022 CrCl: 57 ml/min   Refill sent.

## 2022-11-30 DIAGNOSIS — Z961 Presence of intraocular lens: Secondary | ICD-10-CM | POA: Diagnosis not present

## 2022-12-19 ENCOUNTER — Encounter: Payer: Self-pay | Admitting: Cardiovascular Disease

## 2022-12-19 NOTE — Progress Notes (Signed)
Cardiology Office Note     ID:  Yolanda, Bell August 27, 1941, MRN [id number]  PCP:  Faustino Congress, NP  Cardiologist:   Mertie Moores, MD   Chief Complaint  Patient presents with   Atrial Fibrillation   Hypertension   1.  Constrictive Pericarditis 2.  A-Flutter / Fibrillation 3.  Gout 4.  Spastic dysphonia 5. SVT 6. HTN 7.  Diverticulitis 8.  L renal mass - ? Renal cell carcinoma     She has done well from a cardiac standpoint.  She has had a flare up of gout which has affected both feet.   She has been taking indocin for the flare ups.    She had a bad accident in February. She was getting off an airplane and tripped on the off ramp in [place].  She broke both shoulders, left thumb and had soft tissue injury to her left foot.  She was treated conservatively (bilateral arm slings).   Her left thumb fracture was not found until she returned to [place].   She had some tachypalpitations for 22 hours during her recovery.  Her HR was 130-140 for 22 hours and resolved spontaneously.   I suspect she had a brief episode of atrial flutter.  She was quite emotional when she was telling me the issues of her fall. It is  apparently still is quite upsetting to her.  October 19, 2012: She presents today for further evaluation of atrial fib / flutter that was found at cardiac rehab.  She really cannot tell that her HR is fast and irregular.   She can feel her HR when she is lying down.  She is asymptomatic when she is up and busy doing chores.  She denies any chest pain or dyspnea.   Feb. 4, 2014: Yolanda Bell seems to have rapid atrial fibrillation. [name] on relatively high dose of metoprolol XL and also is on when necessary propranolol. He is tolerating the atrial ablation but certainly does not feel as well as she would like to. [name] here to schedule her cardioversion next week. She denies any angina. She does have some shortness of breath.  January 01, 2013:  She has maintained  NSR since her cardioversion ( Feb. 11, 2014) .   She was quite frustrated with her recent Out patient area / endoscopy department  cardioversion.  Lots of computer issues / scheduling issues.    She has done well from a cardiac standpoint.    Sept. 22, 2014:  She has done well.   She had some redness and tenderness just below her right eye.  She is not having any cardiac problems.     January 14, 2014:  She has done well.  She had a brief episode of tachycardia that lasted for several days.    Sounds like an episode of SVT.  She tried some propranolol and eventually resolved after about 5-6 hours.    June 07, 2014:  Yolanda Bell presents with several weeks of palpitations. She came in today and was noted to be in atrial fibrillation. She started back on her xarelto 4 days ago. Her HR is well controlled  Oct. 5, 2015:  Yolanda Bell had a cardioversion August 31.  She had a brief episode of irregular Hr  - she took some propraoolol.  .  She thinks that she eventually converted back to NSR.    In looking back , these were probably PVCs.   May , 10 , 2016;   Yolanda Kriel  Bell is a 82 y.o. female who presents for follow up of her atrial fib She was cardioverted last august.   She was found to have a few RBCs in a UA - no obvious blood   Nov. 7, 2016:  Doing well from a cardiac standpoint.  No CP , no dyspnea.   Feb 26, 2016: Feeling well  Needs laser surgery on her right eye.  Doing well.   Feb.  6, 2019: Doing well from a cardiac standpoint.   Has some bleeding folowing oral surgery .  She bled for 4 days.   Only issue is her spastic dysphonia   Feb. 10, 2020: On Jan. 20, 2020, she developed and irregular HR .   took Metoprolol 50 PO .   Felt better after several hours.   Has sinus brady with PACs on ECG  .  Is on Xarelto .  Several days later developed a brief pinching sensation in her chest    November 21, 2019: Yolanda Bell is seen today for follow-up visit.  She has a history of atrial  fibrillation and is status post ablation.  She is on Xarelto. She has been doing well.  She is not had any symptoms of Covid.  She received her second vaccine this past Monday. Has been on torsemide.   She did not respond well to furosemide She is going to a casino in [place] tomorrow. By plane   December 15, 2020 Yolanda Bell is doing well.   Having some knee issues.  No cardiac issues  Is in normal rhythm  Fell during a dog walking accident  And has a compression fx of a vertebra Has healed up now ,  Still having some buttock pain   Jan. 9, 2023 Yolanda Bell is seen for follow up of her atrial fib - s/p ablation. On xarelto She had a vasovagal syncope,  (fell in a chair,  broke the chair, ) following her covid booster in Sept. Fell on her buttocks,  had a large hematoma - Hb fell from 11 to 7.4 Received 2u PRBC. Yolanda Bell was held  Echo shows EF of 50-55%  April 06, 2022: Yolanda Bell is seen today for a working visit.  She called the office yesterday complaining of a fast heart rate.  Heart rates been fast for the last day or so.  She has a history of atrial flutter/atrial fibrillation many years ago.  She has been on  Xarelto 20 mg a day since that time.  Has a hx of Afib ablation in 2017 or 2018 [facility])  She needs to have some oral surgery performed in September.  In the past she sees she has had similar oral surgery and had lots of bleeding complications when her Xarelto was started on the day immediately after her procedure.  I have recommended that we hold her Xarelto for another day or so to allow for the surgical site to heal up before we restart Xarelto.   Sept. 15, 2023 Yolanda Bell is seen today for follow up of her atrial fib  Had COVID Aug. 9 Has had COVID toe and COVID black foot  Went to the ER  Venous duplex looked ok  Then had gout  Then developed diverticulitis  Had surgery  - had partial resection of small bowel  No colostomy  Incision is still open, closing by secondary  intention  The hospitalists held her metoprolol and spiro for several days She has restarted the metoprolol XL 25 mg twice a day  And has restarted the spiro 50 mg a day   CT of the abdomen found a 16 mm nodule in the Left kidney - c/w Renal cell carcinoma   Has missed her appt with [place] - has been rescheduled  Missed her appt to get tooth pulled. - Ive encouraged her to reschedule it as soon as possible. She has healed up from her abdominal surgery to have this tooth pulled.    December 22, 2022 Yolanda Bell is seen for follow up of her atrial fib Was seen by Oda Kilts in Jan, 2024 MR of the abdomen revealed a mass suspicious for papillary renal cell carcinoma .  Mass is small .  She had her urologist are watching for now  Repeat MRI in may .   Has lost weight  Wt is 135 lbs            Past Medical History:  Diagnosis Date   Atrial fibrillation ([place])    Status post TEE cardioversion   Constrictive pericarditis    2 status post pericardial stripping at the [facility]   Gout    Hypertension    Spastic dysphonia    Supraventricular tachycardia    Status post RF ablation by Dr. Peterson [place]ard    Past Surgical History:  Procedure Laterality Date   [place] N/A 06/03/2022   Procedure: SMALL BOWEL RESECTION;  Surgeon: Greer Pickerel, MD;  Location: [facility];  Service: General;  Laterality: N/A;   CARDIOVERSION  11/21/2012   Procedure: CARDIOVERSION;  Surgeon: Darlin Coco, MD;  Location: [facility];  Service: Cardiovascular;;   CARDIOVERSION N/A 06/10/2014   Procedure: CARDIOVERSION;  Surgeon: Thayer Headings, MD;  Location: [facility];  Service: Cardiovascular;  Laterality: N/A;   CARDIOVERSION N/A 10/14/2015   Procedure: CARDIOVERSION;  Surgeon: Josue Hector, MD;  Location: [facility] ENDOSCOPY;  Service: Cardiovascular;  Laterality: N/A;   CARDIOVERSION N/A 05/12/2016   Procedure: CARDIOVERSION;  Surgeon: Skeet Latch, MD;  Location:  [facility] ENDOSCOPY;  Service: Cardiovascular;  Laterality: N/A;   CARDIOVERSION N/A 04/09/2022   Procedure: CARDIOVERSION;  Surgeon: Elouise Munroe, MD;  Location: [facility];  Service: Cardiovascular;  Laterality: N/A;   [place] N/A 08/20/2016   Procedure: Atrial Fibrillation Ablation;  Surgeon: [name], MD;  Location: [place] CV LAB;  Service: Cardiovascular;  Laterality: N/A;   EYE SURGERY     cataracts   LAPAROTOMY N/A 06/03/2022   Procedure: EXPLORATORY LAPAROTOMY;  Surgeon: Greer Pickerel, MD;  Location: [place];  Service: General;  Laterality: N/A;   PERICARDIECTOMY     TEE WITHOUT CARDIOVERSION N/A 06/10/2014   Procedure: TRANSESOPHAGEAL ECHOCARDIOGRAM (TEE);  Surgeon: Thayer Headings, MD;  Location[facility] ENDOSCOPY;  Service: Cardiovascular;  Laterality: N/A;   TONSILLECTOMY       Current Outpatient Medications  Medication Sig Dispense Refill   Ascorbic Acid (VITAMIN C PO) Take 1 tablet by mouth daily.     Calcium Carb-Cholecalciferol (CALCIUM + VITAMIN D3 PO) Take 1 tablet by mouth daily.     denosumab (PROLIA) 60 MG/ML SOSY injection Inject 60 mg into the skin every 6 (six) months.     DENTA 5000 PLUS 1.1 % CREA dental cream Place 1 application  onto teeth 2 (two) times daily.     metoprolol succinate (TOPROL-XL) 25 MG 24 hr tablet Take one tablet by mouth (25 mg) twice a day. 180 tablet  3   propranolol (INDERAL) 10 MG tablet TAKE 1 TABLET BY MOUTH 4 TIMES A DAY AS NEEDED 120 tablet 10   spironolactone (ALDACTONE) 50 MG tablet TAKE 1 TABLET BY MOUTH EVERY DAY 90 tablet 2   XARELTO 20 MG TABS tablet TAKE 1 TABLET BY MOUTH DAILY WITH SUPPER 90 tablet 1   No current facility-administered medications for this visit.    Allergies:   Corticosteroids, Covid-19 (mrna) vaccine, Other, Pacerone [amiodarone], Erythromycin, and Zyloprim [allopurinol]    Social History:  The patient  reports that she has never smoked. She has never  used smokeless tobacco. She reports current alcohol use. She reports that she does not use drugs.   Family History:  The patient's family history includes Breast cancer in her sister; Emphysema in her father; Heart disease in her mother.    ROS: Noted in current history.  Otherwise negative.   Physical Exam: Blood pressure 108/62, pulse 75, height '5\' 2"'$  (1.575 m), weight 135 lb 6.4 oz (61.4 kg), SpO2 98 %.       GEN:  Well nourished, well developed in no acute distress HEENT: Normal NECK: No JVD; No carotid bruits LYMPHATICS: No lymphadenopathy CARDIAC: RRR   RESPIRATORY:  Clear to auscultation without rales, wheezing or rhonchi  ABDOMEN: Soft, non-tender, non-distended MUSCULOSKELETAL:  No edema; No deformity  SKIN: Warm and dry NEUROLOGIC:  Alert and oriented x 3      EKG:        Recent Labs: 06/11/2022: ALT 20; Magnesium 1.9 10/20/2022: BUN 9; Creatinine, Ser 0.78; Hemoglobin 12.5; Platelets 193; Potassium 4.1; Sodium 137    Lipid Panel    Component Value Date/Time   CHOL 172 11/21/2019 1035   TRIG 96 11/21/2019 1035   HDL 59 11/21/2019 1035   CHOLHDL 2.9 11/21/2019 1035   CHOLHDL 3.4 08/18/2015 1147   VLDL 17 08/18/2015 1147   LDLCALC 96 11/21/2019 1035      Wt Readings from Last 3 Encounters:  12/22/22 135 lb 6.4 oz (61.4 kg)  10/20/22 136 lb 3.2 oz (61.8 kg)  07/23/22 135 lb (61.2 kg)      Other studies Reviewed: Additional studies/ records that were reviewed today include: . Review of the above records demonstrates:    ASSESSMENT AND PLAN:  1.  Constrictive Pericarditis -  S/p stripping   2.  A-Flutter / Fibrillation -    no recurrent episode    3.  Renal cell carcinoma:    small renal mass,   4. Spastic dysphonia -   stable ,  she and urologist are just watching for now     6. HTN -   stable, cont  meds    Current medicines are reviewed at length with the patient today.  The patient does not have concerns regarding  medicines.  The following changes have been made:  no change  Labs/ tests ordered today include:   No orders of the defined types were placed in this encounter.    Disposition:        Mertie Moores, MD  12/22/2022 5:39 PM    [address], [place], [place]  [address] Phone: [phone]; Fax: [phone]

## 2022-12-22 ENCOUNTER — Ambulatory Visit: Payer: Medicare PPO | Attending: Cardiovascular Disease | Admitting: Cardiovascular Disease

## 2022-12-22 ENCOUNTER — Encounter: Payer: Self-pay | Admitting: Cardiovascular Disease

## 2022-12-22 VITALS — BP 108/62 | HR 75 | Ht 62.0 in | Wt 135.4 lb

## 2022-12-22 DIAGNOSIS — I480 Paroxysmal atrial fibrillation: Secondary | ICD-10-CM | POA: Diagnosis not present

## 2022-12-22 DIAGNOSIS — I10 Essential (primary) hypertension: Secondary | ICD-10-CM

## 2022-12-22 NOTE — Patient Instructions (Signed)
Medication Instructions:  REMOVED Flonase *If you need a refill on your cardiac medications before your next appointment, please call your pharmacy*   Lab Work: NONE If you have labs (blood work) drawn today and your tests are completely normal, you will receive your results only by: [place] (if you have MyChart) OR A paper copy in the mail If you have any lab test that is abnormal or we need to change your treatment, we will call you to review the results.   Testing/Procedures: NONE   Follow-Up: At [facility], you and your health needs are our priority.  As part of our continuing mission to provide you with exceptional heart care, we have created designated Provider Care Teams.  These Care Teams include your primary Cardiologist (physician) and Advanced Practice Providers (APPs -  Physician Assistants and Nurse Practitioners) who all work together to provide you with the care you need, when you need it.  We recommend signing up for the patient portal called "MyChart".  Sign up information is provided on this After Visit Summary.  MyChart is used to connect with patients for Virtual Visits (Telemedicine).  Patients are able to view lab/test results, encounter notes, upcoming appointments, etc.  Non-urgent messages can be sent to your provider as well.   To learn more about what you can do with MyChart, go to [email or web address].    Your next appointment:   1 year(s)  Provider:   Mertie Moores, MD

## 2023-01-12 DIAGNOSIS — M1711 Unilateral primary osteoarthritis, right knee: Secondary | ICD-10-CM | POA: Diagnosis not present

## 2023-01-18 DIAGNOSIS — M1711 Unilateral primary osteoarthritis, right knee: Secondary | ICD-10-CM | POA: Diagnosis not present

## 2023-01-25 DIAGNOSIS — M25561 Pain in right knee: Secondary | ICD-10-CM | POA: Diagnosis not present

## 2023-01-25 DIAGNOSIS — M1711 Unilateral primary osteoarthritis, right knee: Secondary | ICD-10-CM | POA: Diagnosis not present

## 2023-02-11 ENCOUNTER — Other Ambulatory Visit: Payer: Self-pay | Admitting: Urology

## 2023-02-11 ENCOUNTER — Encounter: Payer: Self-pay | Admitting: Urology

## 2023-02-11 DIAGNOSIS — D49512 Neoplasm of unspecified behavior of left kidney: Secondary | ICD-10-CM

## 2023-02-19 IMAGING — DX DG PORTABLE PELVIS
1 series · 1 of 1 positions shown · non-contrast
Comparison: None.

CLINICAL DATA: Status post fall, the patient is on blood thinners.

EXAM:
PORTABLE PELVIS 1-2 VIEWS

[pelvis]
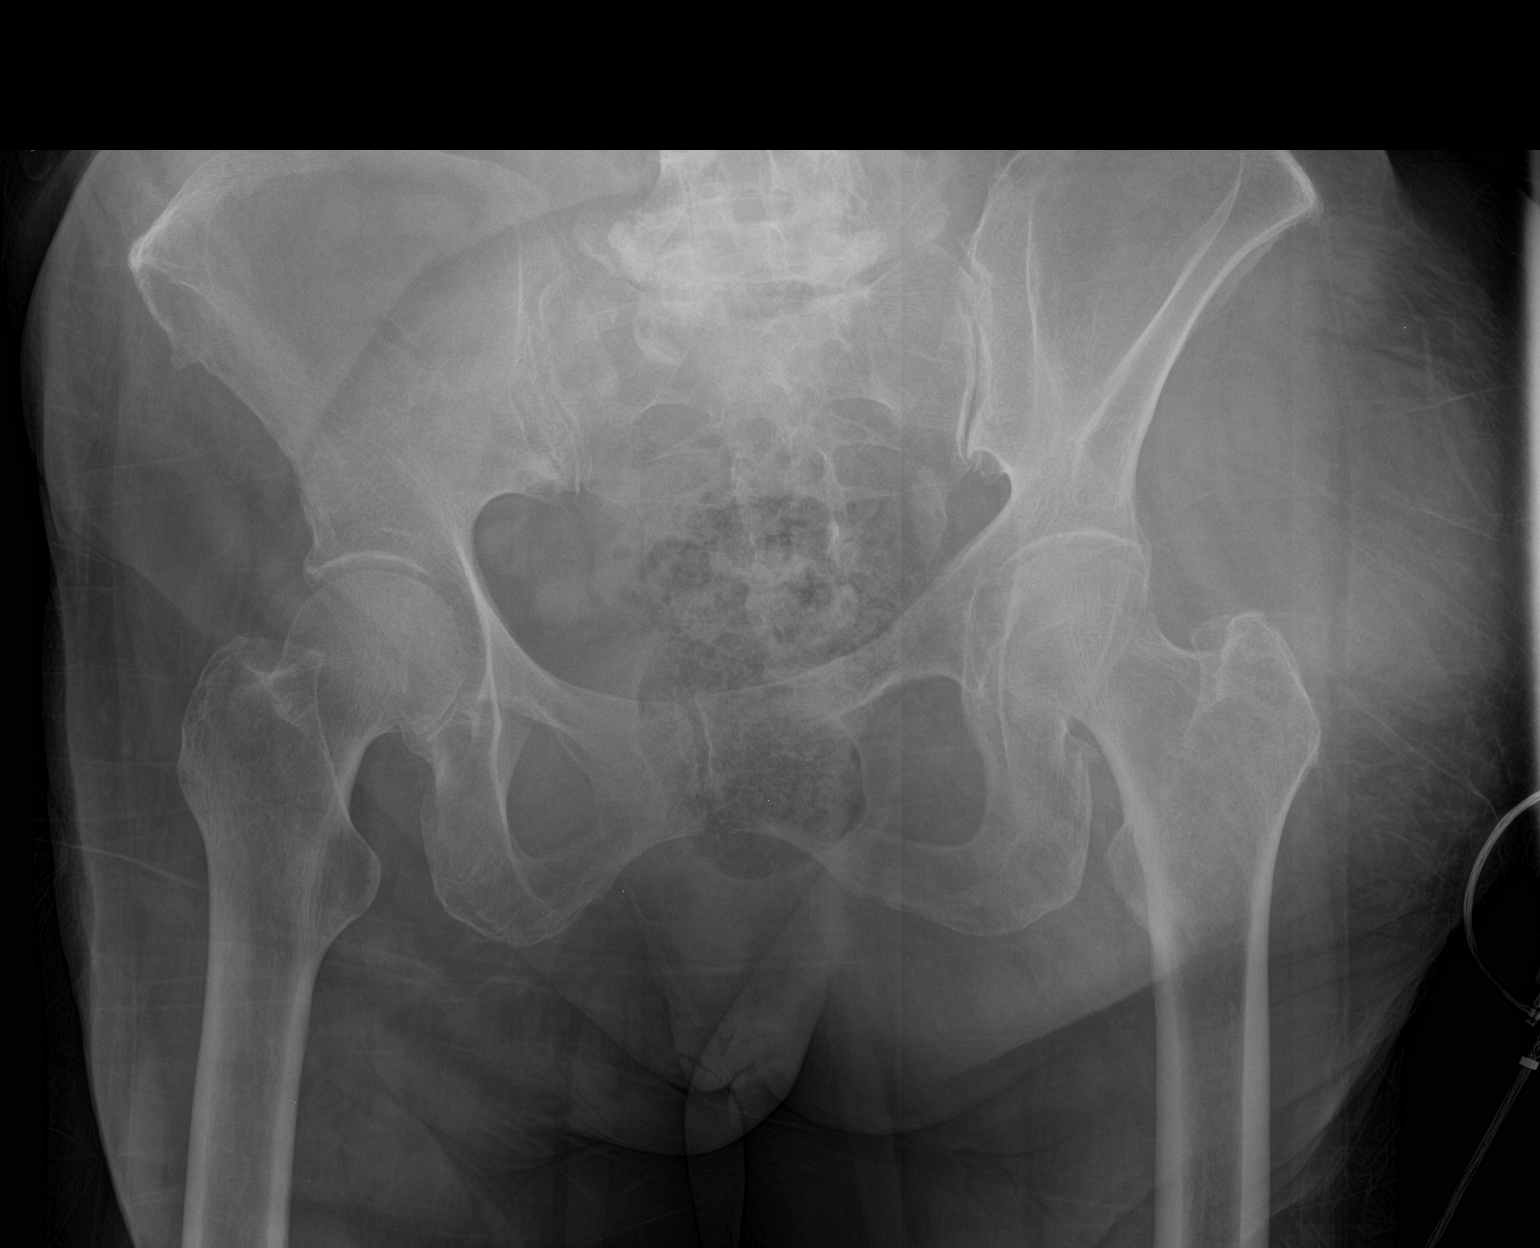

[1 of 1 positions shown; findings below may reference images not displayed]

FINDINGS: There is no evidence of pelvic fracture or dislocation. Degenerative
joint changes of the lower lumbar spine noted.
IMPRESSION: No acute fracture or dislocation.

## 2023-02-19 IMAGING — CT CT ABD-PELV W/ CM
2 of 5 series · 15 of 46 positions shown, 17 images · IV contrast (APPLIED)
Comparison: 12/25/2004.

CLINICAL DATA: Fell this morning.  On anticoagulants.

EXAM:
CT ABDOMEN AND PELVIS WITH CONTRAST
TECHNIQUE: Multidetector CT imaging of the abdomen and pelvis was performed
using the standard protocol following bolus administration of
intravenous contrast.
CONTRAST:  75mL OMNIPAQUE IOHEXOL 300 MG/ML  SOLN

[Series 3: abdomen 5.0 · axial · 0.98mm/px · z∈[-846,-411]mm · 12 of 103 slices shown, 14 images]
[im 8/103  soft-tissue]
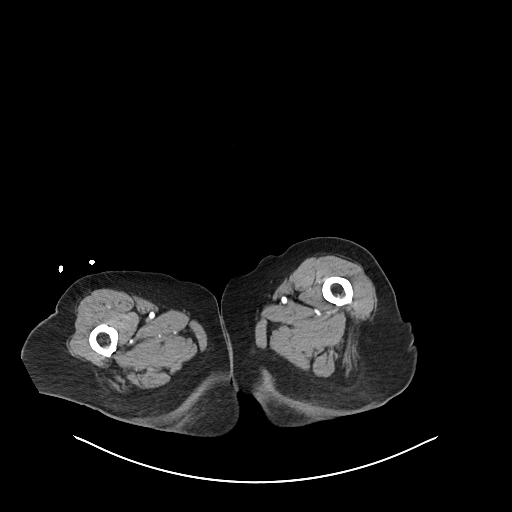
[im 8/103  bone]
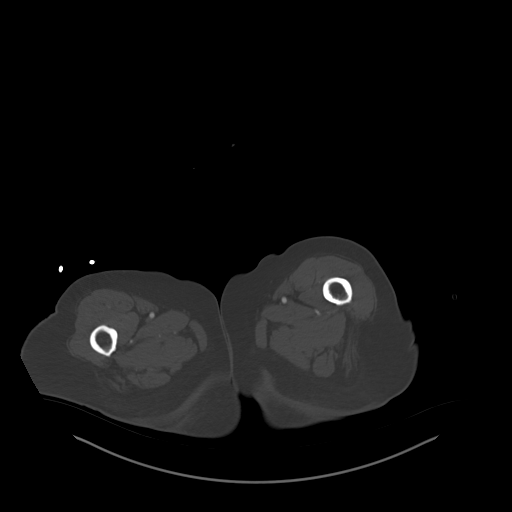
[im 16/103  soft-tissue]
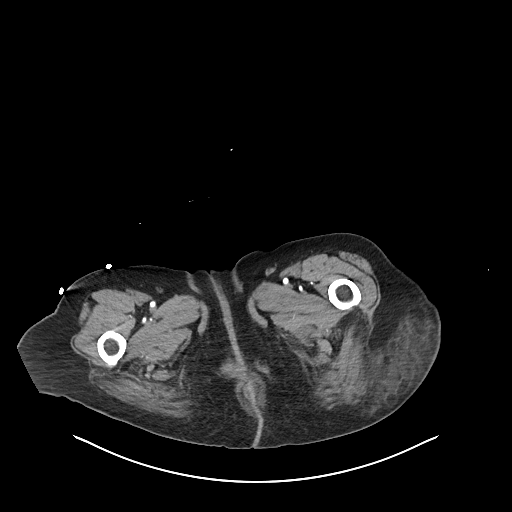
[im 24/103  soft-tissue]
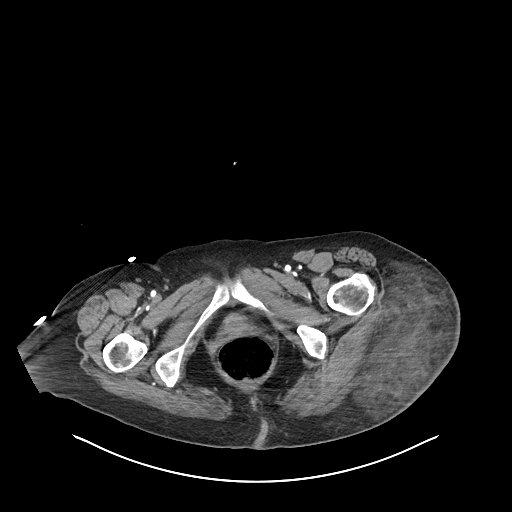
[im 32/103  soft-tissue]
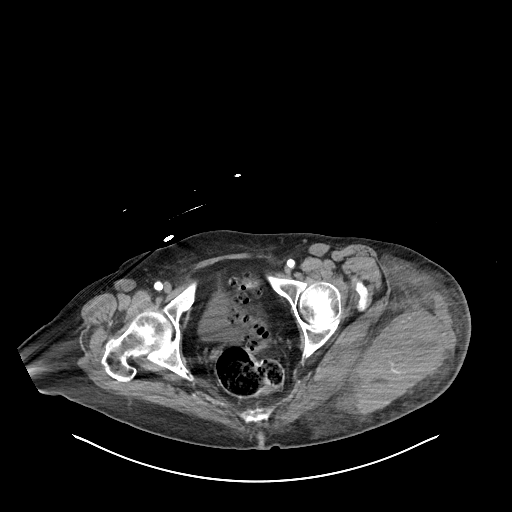
[im 40/103  soft-tissue]
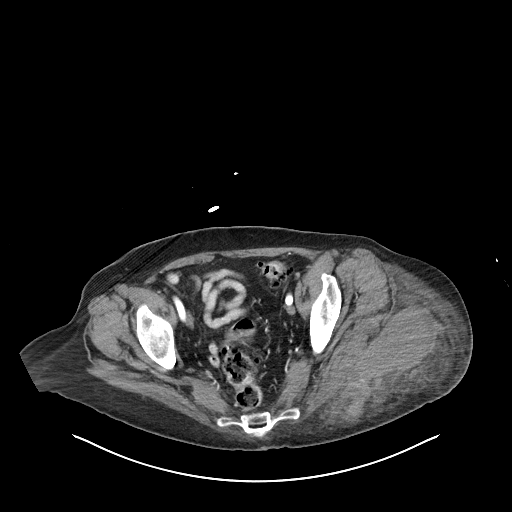
[im 48/103  soft-tissue]
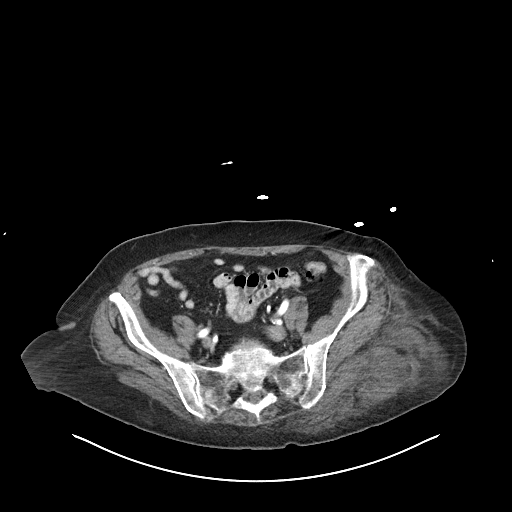
[im 55/103  soft-tissue]
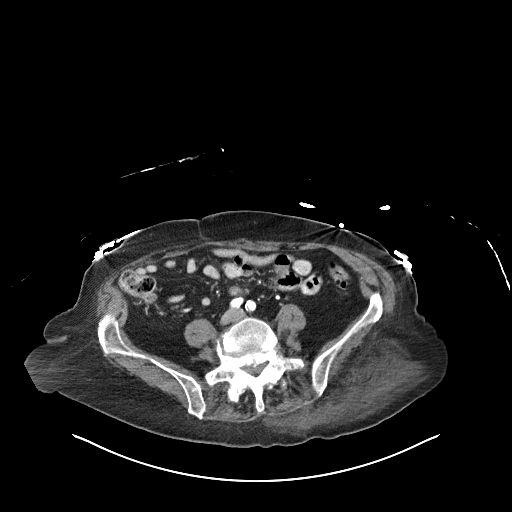
[im 63/103  soft-tissue]
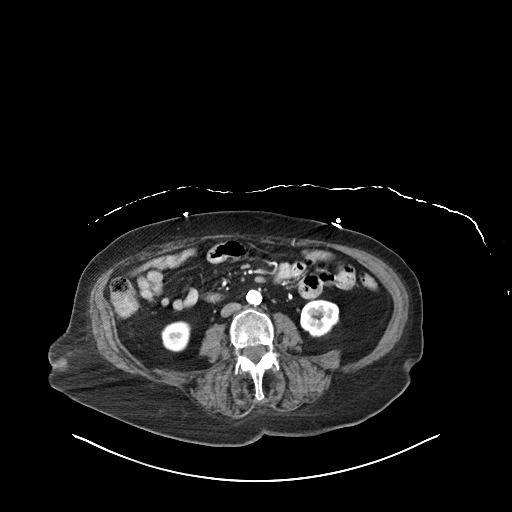
[im 71/103  soft-tissue]
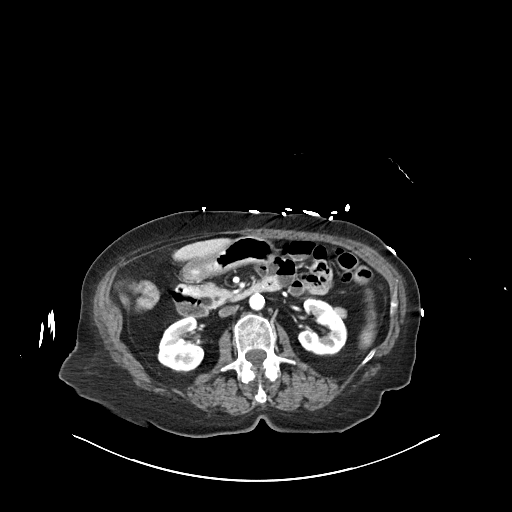
[im 71/103  bone]
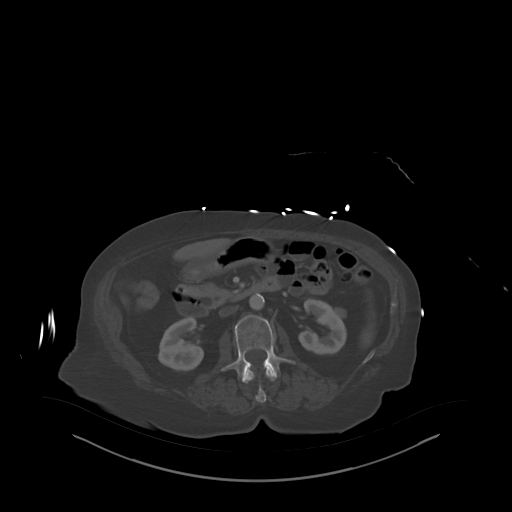
[im 79/103  soft-tissue]
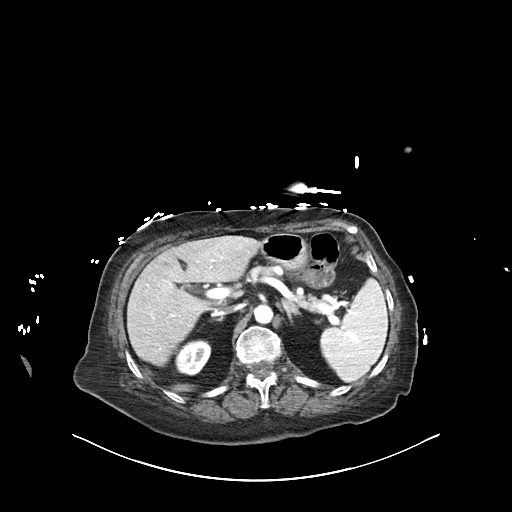
[im 87/103  soft-tissue]
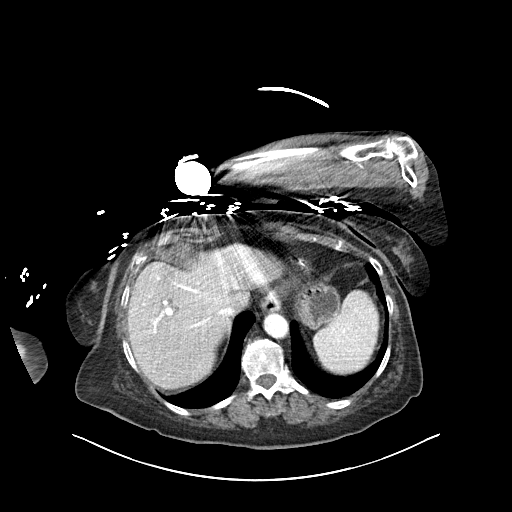
[im 95/103  soft-tissue]
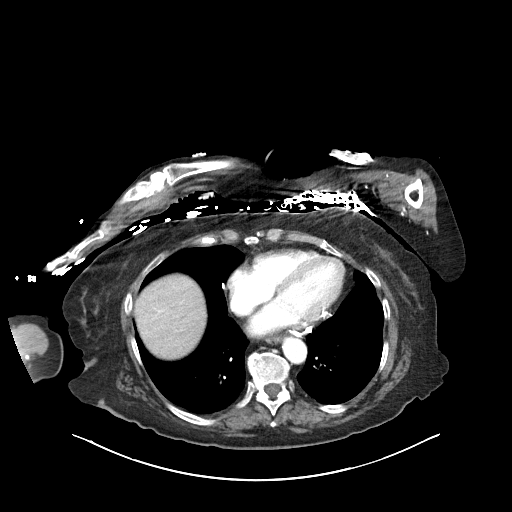

[Series 6: abdomen 3.0 mpr cor · coronal · 0.94mm/px · 3 of 96 slices shown]
[im 32/96  soft-tissue]
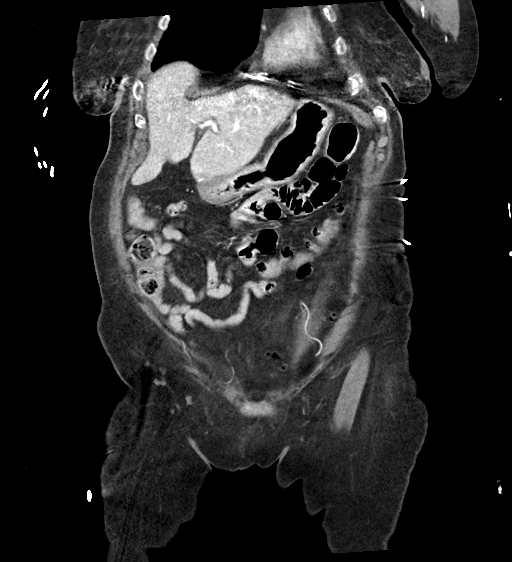
[im 43/96  soft-tissue]
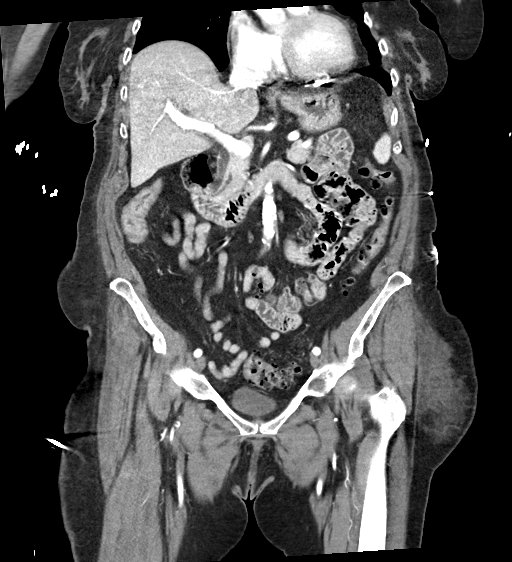
[im 53/96  soft-tissue]
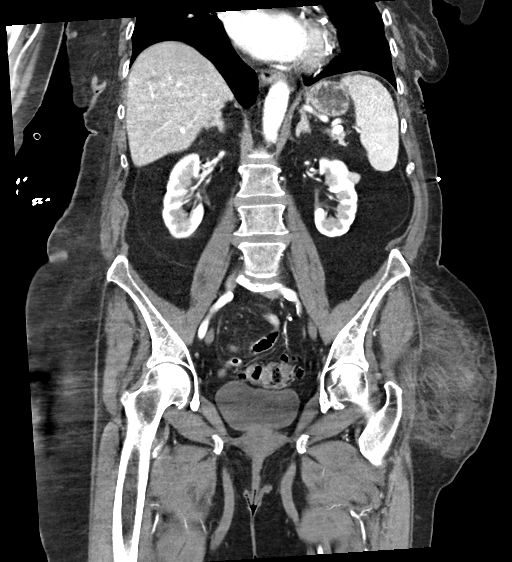

[15 of 46 positions shown; findings below may reference images not displayed]

FINDINGS: Lower chest: Interval 7 mm noncalcified nodule in the medial left
lower lobe on image number [DATE]. This measured 8 mm in maximum
diameter previously in 9666. No new nodules. Atheromatous
calcifications, including the coronary arteries and aorta.

Hepatobiliary: Diffuse low density of the liver relative to the
spleen. Cholecystectomy clips.

Pancreas: Stable mildly dilated pancreatic duct with a maximum
diameter of 3 mm. No pancreatic mass visualized.

Spleen: Normal in size without focal abnormality.

Adrenals/Urinary Tract: Normal appearing adrenal glands. Stable
small left renal cysts, including an exophytic hemorrhagic or
proteinaceous cyst. Small right kidney. Unremarkable ureters and
urinary bladder.

Stomach/Bowel: Large number of sigmoid and descending colon
diverticula without evidence of diverticulitis. Normal appearing
appendix. Unremarkable stomach and small bowel.

Vascular/Lymphatic: Atheromatous arterial calcifications without
aneurysm. No enlarged lymph nodes.

Reproductive: Status post hysterectomy. No adnexal masses.

Other: Large hematoma extensive edema in the subcutaneous fat of the
left buttocks. The hematoma measures 14.2 x 6.1 cm on image number
72/3.

Musculoskeletal: No acute fractures, dislocations or subluxations
seen. There is an approximately 80% compression deformity of the L5
vertebra with 6 mm of retropulsion combining with bilateral facet
and ligamentum flavum hypertrophy to produce marked canal stenosis
at the L4-5 level with mild bilateral foraminal stenosis. Lumbar and
lower thoracic spine degenerative changes.
IMPRESSION: 1. Large hematoma and associated subcutaneous edema in the
subcutaneous fat of the left buttocks.
2. No other acute abnormalities.
3. Diffuse hepatic steatosis.
4. No significant change in a 7 mm noncalcified nodule in the left
lower lobe. The long-term stability is compatible with a benign
process and this does not need follow-up.
5. Marked sigmoid and descending colon diverticulosis.
6. Old L5 vertebral compression fracture with retropulsion and
marked canal stenosis at the L4-5 level.

## 2023-02-19 IMAGING — CT CT HEAD W/O CM
1 series · 15 of 30 positions shown, 19 images · non-contrast
Comparison: None.

CLINICAL DATA: Fell this morning.  On anticoagulants.

EXAM:
CT HEAD WITHOUT CONTRAST
CT CERVICAL SPINE WITHOUT CONTRAST
TECHNIQUE: Multidetector CT imaging of the head and cervical spine was
performed following the standard protocol without intravenous
contrast. Multiplanar CT image reconstructions of the cervical spine
were also generated.

[Series 4: head wo · axial · 0.41mm/px · z∈[-154,+1]mm · 15 of 35 slices shown, 19 images]
[im 2/35  brain]
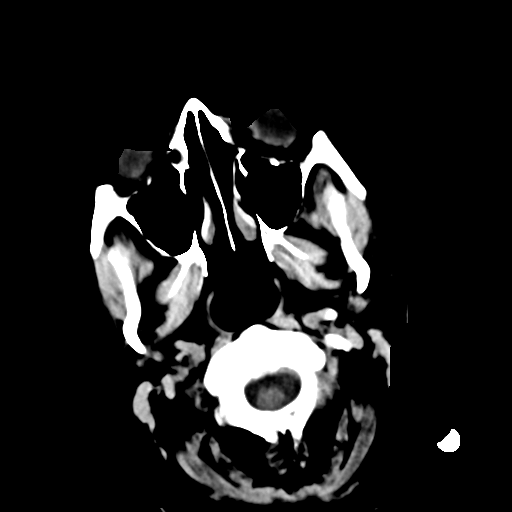
[im 2/35  bone]
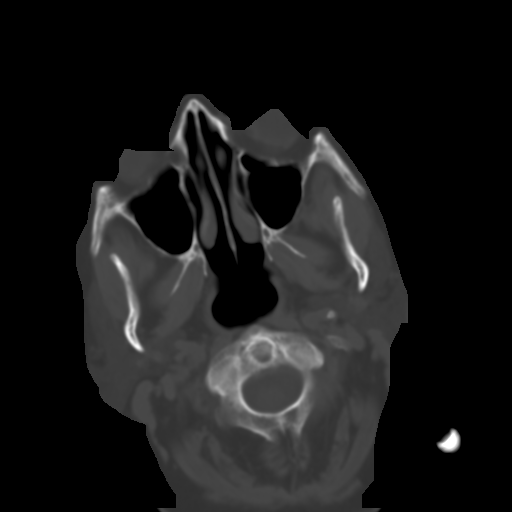
[im 4/35  brain]
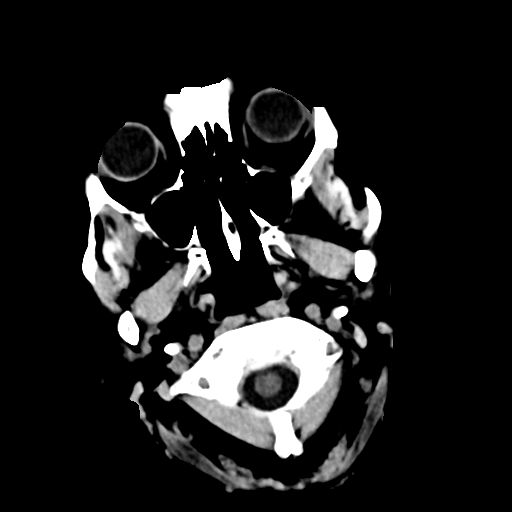
[im 6/35  brain]
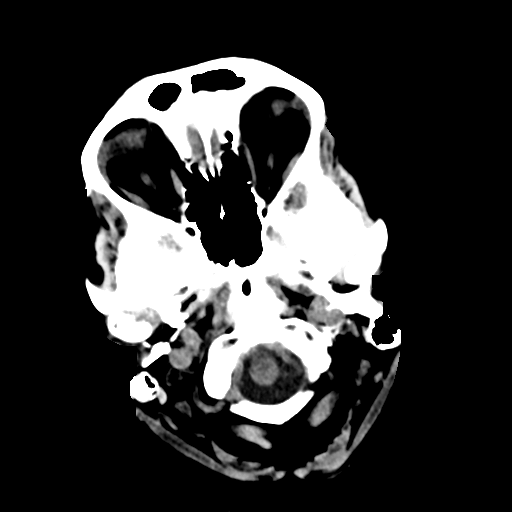
[im 9/35  brain]
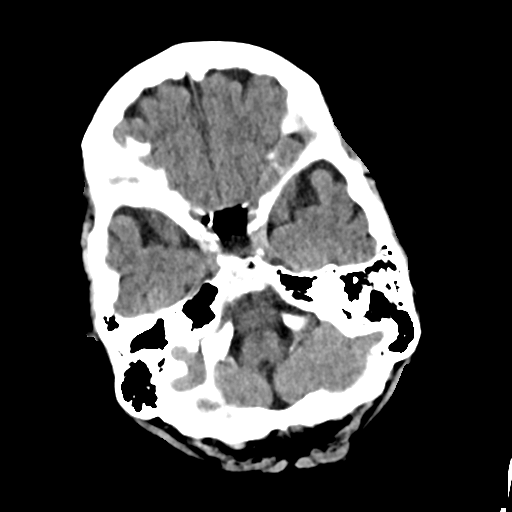
[im 11/35  brain]
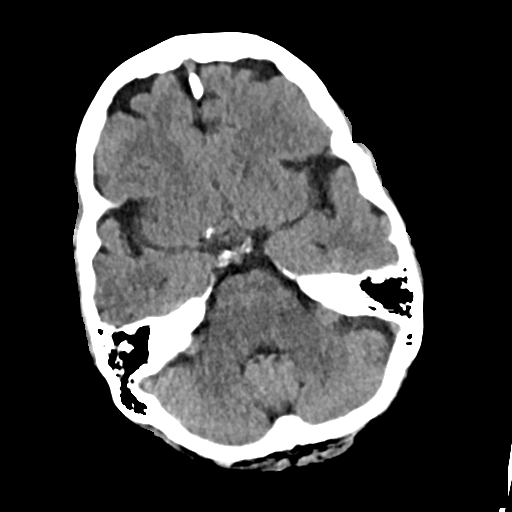
[im 11/35  bone]
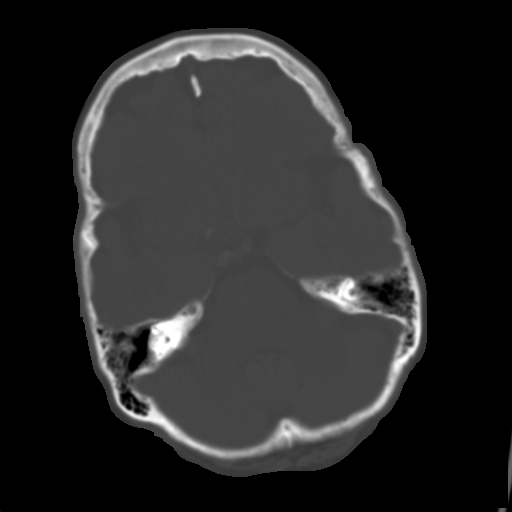
[im 13/35  brain]
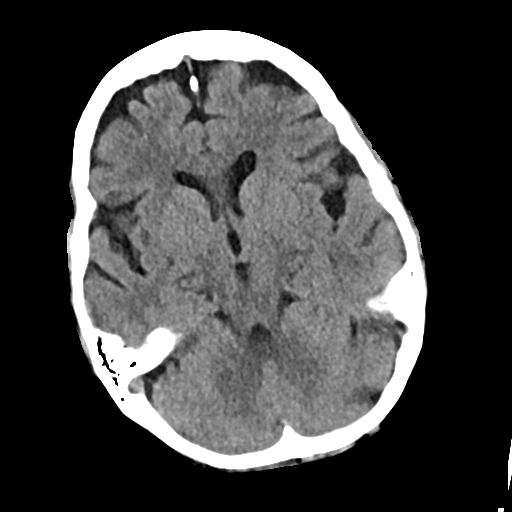
[im 16/35  brain]
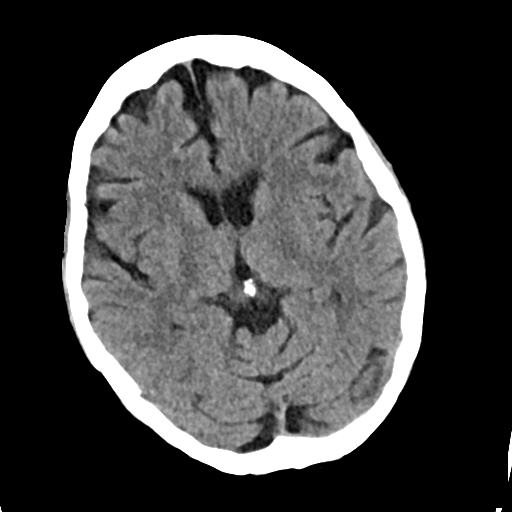
[im 18/35  brain]
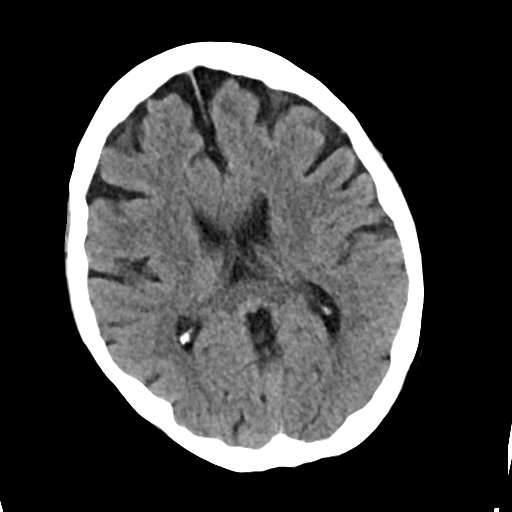
[im 19/35  brain]
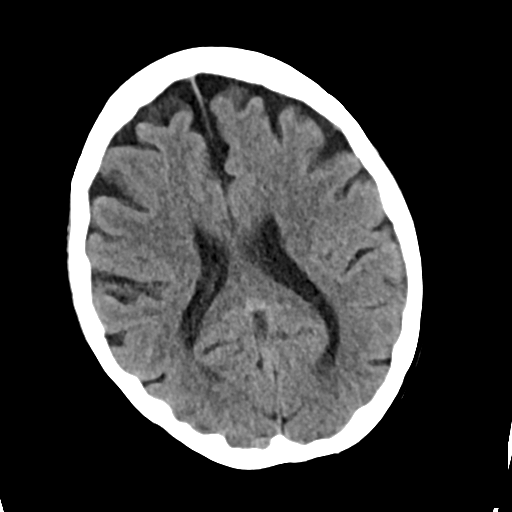
[im 19/35  bone]
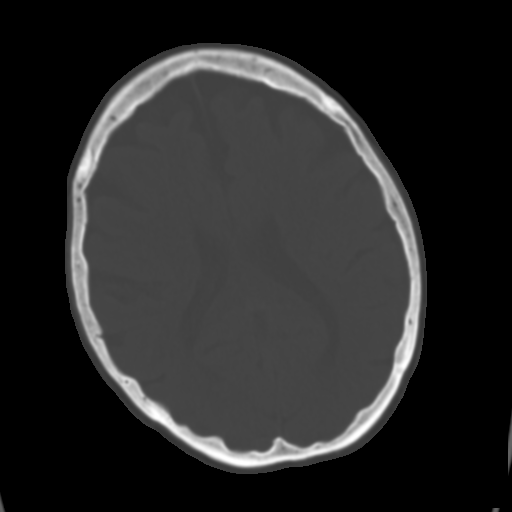
[im 22/35  brain]
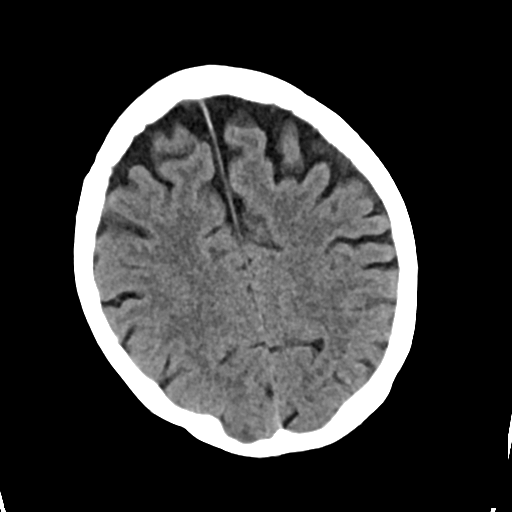
[im 24/35  brain]
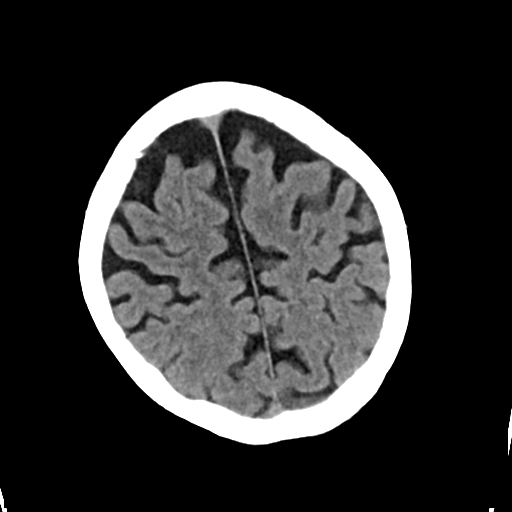
[im 26/35  brain]
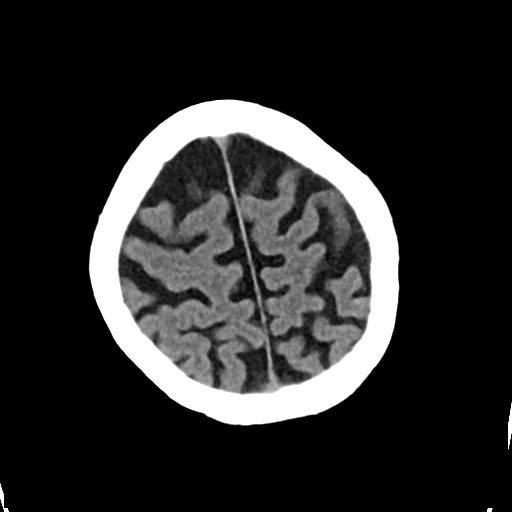
[im 29/35  brain]
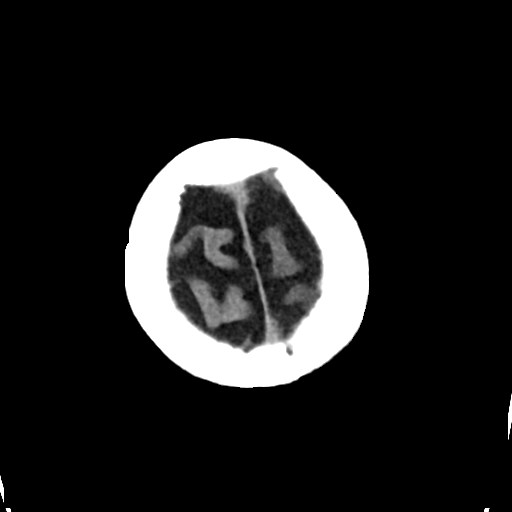
[im 29/35  bone]
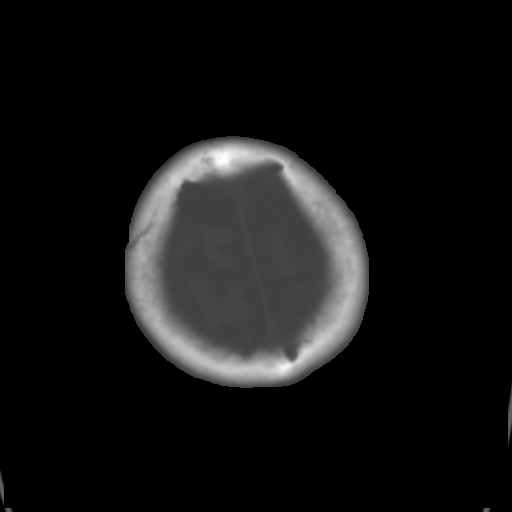
[im 31/35  brain]
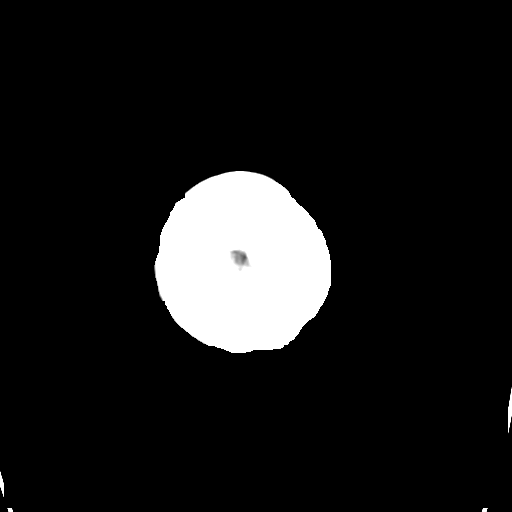
[im 33/35  brain]
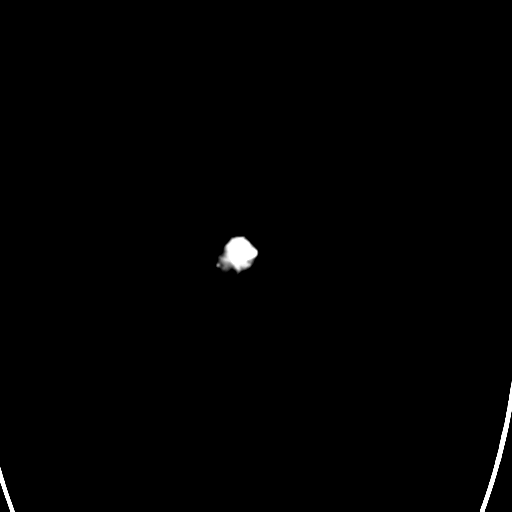

[15 of 30 positions shown; findings below may reference images not displayed]

FINDINGS: CT HEAD FINDINGS

Brain: Mildly enlarged ventricles and cortical sulci. No
intracranial hemorrhage, mass lesion or CT evidence of acute
infarction.

Vascular: No hyperdense vessel or unexpected calcification.

Skull: Normal. Negative for fracture or focal lesion.

Sinuses/Orbits: Status post bilateral cataract extraction.
Unremarkable bones and included paranasal sinuses.

Other: None.

CT CERVICAL SPINE FINDINGS

Alignment: Mild reversal of the normal cervical lordosis. Mild
anterolisthesis at the C3-4 and C6-7 levels.

Skull base and vertebrae: No acute fracture. No primary bone lesion
or focal pathologic process.

Soft tissues and spinal canal: No prevertebral fluid or swelling. No
visible canal hematoma.

Disc levels:  Multilevel degenerative changes.

Upper chest: Clear lung apices.

Other: None.
IMPRESSION: 1. No skull fracture or intracranial hemorrhage.
2. No cervical spine fracture or traumatic subluxation.
3. Mild diffuse cerebral and cerebellar atrophy.
4. Multilevel cervical and upper thoracic spine degenerative
changes.

## 2023-02-19 IMAGING — DX DG CHEST 1V PORT
1 series · 1 of 1 positions shown · non-contrast
Comparison: August 09, 2005

CLINICAL DATA: Status post fall.  Patient is on blood thinners.

EXAM:
PORTABLE CHEST 1 VIEW

[chest]
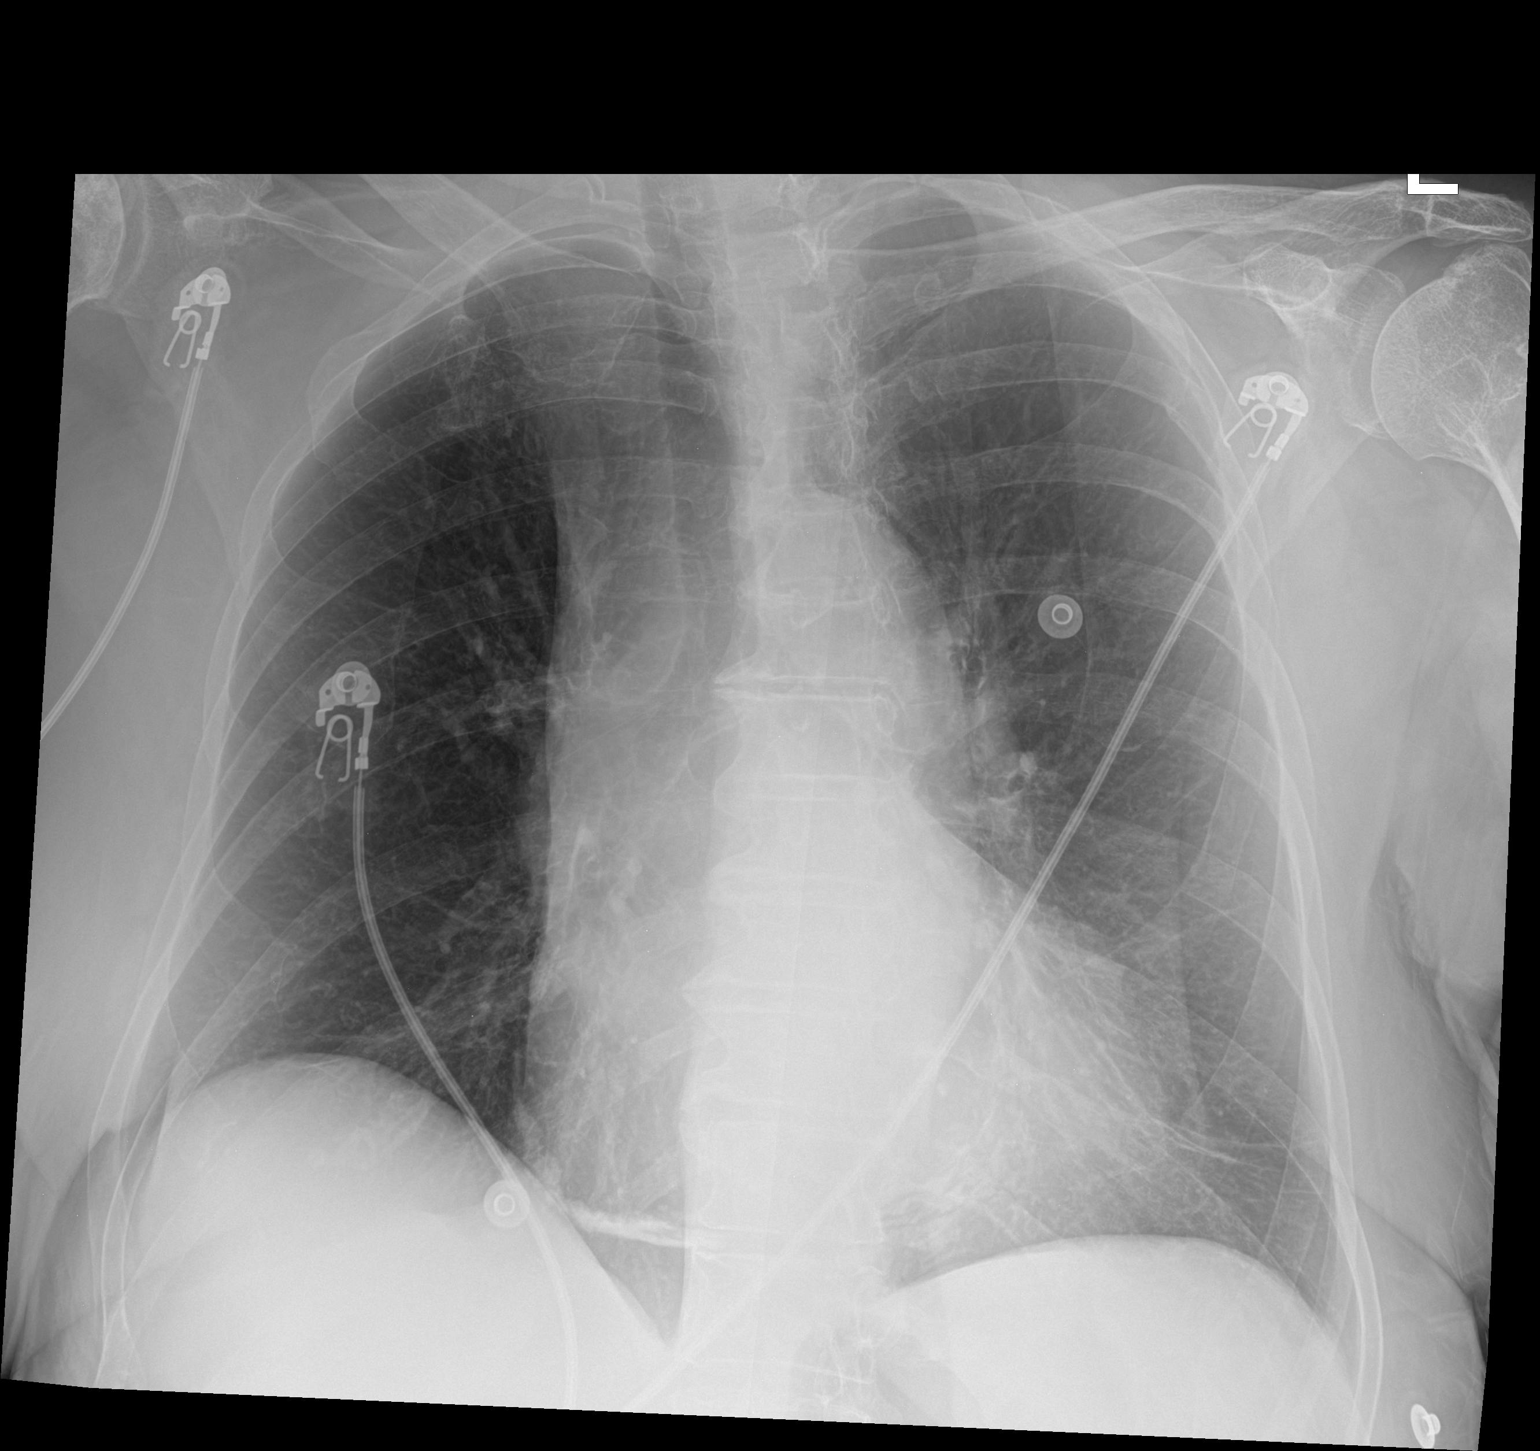

[1 of 1 positions shown; findings below may reference images not displayed]

FINDINGS: The heart size and mediastinal contours are within normal limits.
There is no focal infiltrate, pulmonary edema, or pleural effusion.
Minus scar/identified in both bases. The visualized skeletal
structures are stable.
IMPRESSION: No active disease.

## 2023-03-11 DIAGNOSIS — M25561 Pain in right knee: Secondary | ICD-10-CM | POA: Diagnosis not present

## 2023-03-16 ENCOUNTER — Ambulatory Visit
Admission: RE | Admit: 2023-03-16 | Discharge: 2023-03-16 | Disposition: A | Discharge status: Home / Self Care | Payer: Medicare PPO | Source: Ambulatory Visit | Attending: Urology | Admitting: Urology

## 2023-03-16 DIAGNOSIS — D49512 Neoplasm of unspecified behavior of left kidney: Secondary | ICD-10-CM

## 2023-03-16 DIAGNOSIS — N281 Cyst of kidney, acquired: Secondary | ICD-10-CM | POA: Diagnosis not present

## 2023-03-16 MED ORDER — GADOPICLENOL 0.5 MMOL/ML IV SOLN
6.0000 mL | Freq: Once | INTRAVENOUS | Status: AC | PRN
  Administered 2023-03-16: 6 mL via INTRAVENOUS

## 2023-03-21 DIAGNOSIS — M818 Other osteoporosis without current pathological fracture: Secondary | ICD-10-CM | POA: Diagnosis not present

## 2023-03-22 DIAGNOSIS — D49512 Neoplasm of unspecified behavior of left kidney: Secondary | ICD-10-CM | POA: Diagnosis not present

## 2023-03-28 ENCOUNTER — Telehealth: Payer: Self-pay | Admitting: Cardiovascular Disease

## 2023-03-28 NOTE — Telephone Encounter (Signed)
Patient states yesterday she was working in the yard (shaded) for about 20-30 minutes and stopped because she began feeling lightheaded. States after sitting down for "about 5 minutes or less," she felt her heart racing. She went inside and checked HR with BP kit and it was 134bpm. States she tried vagal maneuvers, but no relief. A half hour later, she took a propranolol 10mg  and sat down. Checked BP 115/something, at 6:30pm (she felt better suddenly) and HR was 51 and BP was 138/something. Skipped normal nighttime metoprolol last night at bedtime and vitals were 120/64, HR 55. Upon waking this morning, felt good and vitals HR 63, BP 100/54. No complaints at this time. Patient does has history of SVT, but states only happened once. Informed her that it sounds like that's what occurred and that she did the correct thing by taking the propranolol. Advised to call back if repeat episodes occur or begin to happen frequently.

## 2023-03-28 NOTE — Telephone Encounter (Signed)
Patient c/o Palpitations:  High priority if patient c/o lightheadedness, shortness of breath, or chest pain  How long have you had palpitations/irregular HR/ Afib? Are you having the symptoms now?  Patient states she had a tachycardic episode yesterday for about 2.5 hours, no symptoms currently  Are you currently experiencing lightheadedness, SOB or CP?  No   Do you have a history of afib (atrial fibrillation) or irregular heart rhythm?  Hx afib, but patient states she never went into afib last night  Have you checked your BP or HR? (document readings if available):  6/17: 62, 61 HR, patient states her BP was low at 100/54, but last night it was around 120/63  Are you experiencing any other symptoms?  Lightheadedness, not currently

## 2023-03-29 DIAGNOSIS — M25562 Pain in left knee: Secondary | ICD-10-CM | POA: Diagnosis not present

## 2023-03-29 DIAGNOSIS — M25561 Pain in right knee: Secondary | ICD-10-CM | POA: Diagnosis not present

## 2023-03-31 DIAGNOSIS — W57XXXA Bitten or stung by nonvenomous insect and other nonvenomous arthropods, initial encounter: Secondary | ICD-10-CM | POA: Diagnosis not present

## 2023-03-31 DIAGNOSIS — I480 Paroxysmal atrial fibrillation: Secondary | ICD-10-CM | POA: Diagnosis not present

## 2023-03-31 DIAGNOSIS — S70361A Insect bite (nonvenomous), right thigh, initial encounter: Secondary | ICD-10-CM | POA: Diagnosis not present

## 2023-04-01 ENCOUNTER — Encounter (HOSPITAL_COMMUNITY): Payer: Self-pay | Admitting: Emergency Medicine

## 2023-04-01 ENCOUNTER — Emergency Department (HOSPITAL_COMMUNITY): Payer: Medicare PPO

## 2023-04-01 ENCOUNTER — Other Ambulatory Visit: Payer: Self-pay

## 2023-04-01 ENCOUNTER — Inpatient Hospital Stay (HOSPITAL_COMMUNITY)
Admission: EM | Admit: 2023-04-01 | Discharge: 2023-04-08 | DRG: 281 | Disposition: A | Discharge status: Home / Self Care | Payer: Medicare PPO | Attending: Internal Medicine | Admitting: Internal Medicine

## 2023-04-01 DIAGNOSIS — Z881 Allergy status to other antibiotic agents status: Secondary | ICD-10-CM

## 2023-04-01 DIAGNOSIS — I700 Atherosclerosis of aorta: Secondary | ICD-10-CM | POA: Diagnosis not present

## 2023-04-01 DIAGNOSIS — I517 Cardiomegaly: Secondary | ICD-10-CM | POA: Diagnosis not present

## 2023-04-01 DIAGNOSIS — I4819 Other persistent atrial fibrillation: Principal | ICD-10-CM | POA: Diagnosis present

## 2023-04-01 DIAGNOSIS — Z803 Family history of malignant neoplasm of breast: Secondary | ICD-10-CM | POA: Diagnosis not present

## 2023-04-01 DIAGNOSIS — I441 Atrioventricular block, second degree: Secondary | ICD-10-CM | POA: Diagnosis present

## 2023-04-01 DIAGNOSIS — W57XXXA Bitten or stung by nonvenomous insect and other nonvenomous arthropods, initial encounter: Secondary | ICD-10-CM | POA: Diagnosis present

## 2023-04-01 DIAGNOSIS — Z79899 Other long term (current) drug therapy: Secondary | ICD-10-CM

## 2023-04-01 DIAGNOSIS — I110 Hypertensive heart disease with heart failure: Secondary | ICD-10-CM | POA: Diagnosis present

## 2023-04-01 DIAGNOSIS — N179 Acute kidney failure, unspecified: Secondary | ICD-10-CM | POA: Diagnosis present

## 2023-04-01 DIAGNOSIS — I5022 Chronic systolic (congestive) heart failure: Secondary | ICD-10-CM | POA: Diagnosis present

## 2023-04-01 DIAGNOSIS — I484 Atypical atrial flutter: Secondary | ICD-10-CM | POA: Diagnosis not present

## 2023-04-01 DIAGNOSIS — Z8249 Family history of ischemic heart disease and other diseases of the circulatory system: Secondary | ICD-10-CM | POA: Diagnosis not present

## 2023-04-01 DIAGNOSIS — I311 Chronic constrictive pericarditis: Secondary | ICD-10-CM | POA: Diagnosis not present

## 2023-04-01 DIAGNOSIS — S70361A Insect bite (nonvenomous), right thigh, initial encounter: Secondary | ICD-10-CM | POA: Diagnosis present

## 2023-04-01 DIAGNOSIS — I5032 Chronic diastolic (congestive) heart failure: Secondary | ICD-10-CM | POA: Insufficient documentation

## 2023-04-01 DIAGNOSIS — M25562 Pain in left knee: Secondary | ICD-10-CM | POA: Diagnosis not present

## 2023-04-01 DIAGNOSIS — I4719 Other supraventricular tachycardia: Secondary | ICD-10-CM | POA: Diagnosis not present

## 2023-04-01 DIAGNOSIS — Z888 Allergy status to other drugs, medicaments and biological substances status: Secondary | ICD-10-CM | POA: Diagnosis not present

## 2023-04-01 DIAGNOSIS — I471 Supraventricular tachycardia, unspecified: Secondary | ICD-10-CM | POA: Diagnosis not present

## 2023-04-01 DIAGNOSIS — I4891 Unspecified atrial fibrillation: Secondary | ICD-10-CM | POA: Diagnosis not present

## 2023-04-01 DIAGNOSIS — I480 Paroxysmal atrial fibrillation: Secondary | ICD-10-CM | POA: Diagnosis not present

## 2023-04-01 DIAGNOSIS — Z90710 Acquired absence of both cervix and uterus: Secondary | ICD-10-CM

## 2023-04-01 DIAGNOSIS — I080 Rheumatic disorders of both mitral and aortic valves: Secondary | ICD-10-CM | POA: Diagnosis present

## 2023-04-01 DIAGNOSIS — I492 Junctional premature depolarization: Secondary | ICD-10-CM | POA: Diagnosis not present

## 2023-04-01 DIAGNOSIS — Z825 Family history of asthma and other chronic lower respiratory diseases: Secondary | ICD-10-CM | POA: Diagnosis not present

## 2023-04-01 DIAGNOSIS — Z7901 Long term (current) use of anticoagulants: Secondary | ICD-10-CM

## 2023-04-01 DIAGNOSIS — R079 Chest pain, unspecified: Secondary | ICD-10-CM | POA: Diagnosis not present

## 2023-04-01 DIAGNOSIS — M25561 Pain in right knee: Secondary | ICD-10-CM | POA: Diagnosis not present

## 2023-04-01 DIAGNOSIS — I483 Typical atrial flutter: Secondary | ICD-10-CM | POA: Diagnosis present

## 2023-04-01 DIAGNOSIS — J383 Other diseases of vocal cords: Secondary | ICD-10-CM | POA: Diagnosis present

## 2023-04-01 DIAGNOSIS — R202 Paresthesia of skin: Secondary | ICD-10-CM | POA: Diagnosis not present

## 2023-04-01 DIAGNOSIS — I495 Sick sinus syndrome: Secondary | ICD-10-CM | POA: Diagnosis present

## 2023-04-01 DIAGNOSIS — K769 Liver disease, unspecified: Secondary | ICD-10-CM | POA: Diagnosis present

## 2023-04-01 DIAGNOSIS — I21A1 Myocardial infarction type 2: Secondary | ICD-10-CM | POA: Diagnosis present

## 2023-04-01 DIAGNOSIS — J42 Unspecified chronic bronchitis: Secondary | ICD-10-CM | POA: Diagnosis not present

## 2023-04-01 DIAGNOSIS — I519 Heart disease, unspecified: Secondary | ICD-10-CM | POA: Diagnosis not present

## 2023-04-01 DIAGNOSIS — I509 Heart failure, unspecified: Secondary | ICD-10-CM | POA: Diagnosis not present

## 2023-04-01 DIAGNOSIS — Z9049 Acquired absence of other specified parts of digestive tract: Secondary | ICD-10-CM

## 2023-04-01 DIAGNOSIS — I4892 Unspecified atrial flutter: Secondary | ICD-10-CM | POA: Diagnosis not present

## 2023-04-01 DIAGNOSIS — N289 Disorder of kidney and ureter, unspecified: Secondary | ICD-10-CM | POA: Diagnosis present

## 2023-04-01 DIAGNOSIS — I10 Essential (primary) hypertension: Secondary | ICD-10-CM | POA: Diagnosis not present

## 2023-04-01 NOTE — ED Triage Notes (Signed)
Pt c/o tachycardia all day today without relief from propanolol. Pt states that she had a recent episode on 6/17 as well. Has hx of SVT

## 2023-04-02 ENCOUNTER — Other Ambulatory Visit (HOSPITAL_COMMUNITY): Payer: Medicare PPO

## 2023-04-02 ENCOUNTER — Emergency Department (HOSPITAL_COMMUNITY): Payer: Medicare PPO

## 2023-04-02 DIAGNOSIS — I441 Atrioventricular block, second degree: Secondary | ICD-10-CM | POA: Diagnosis not present

## 2023-04-02 DIAGNOSIS — Z881 Allergy status to other antibiotic agents status: Secondary | ICD-10-CM | POA: Diagnosis not present

## 2023-04-02 DIAGNOSIS — Z888 Allergy status to other drugs, medicaments and biological substances status: Secondary | ICD-10-CM | POA: Diagnosis not present

## 2023-04-02 DIAGNOSIS — I4892 Unspecified atrial flutter: Secondary | ICD-10-CM | POA: Diagnosis not present

## 2023-04-02 DIAGNOSIS — I492 Junctional premature depolarization: Secondary | ICD-10-CM | POA: Diagnosis not present

## 2023-04-02 DIAGNOSIS — Z803 Family history of malignant neoplasm of breast: Secondary | ICD-10-CM | POA: Diagnosis not present

## 2023-04-02 DIAGNOSIS — I480 Paroxysmal atrial fibrillation: Secondary | ICD-10-CM | POA: Diagnosis not present

## 2023-04-02 DIAGNOSIS — Z90710 Acquired absence of both cervix and uterus: Secondary | ICD-10-CM | POA: Diagnosis not present

## 2023-04-02 DIAGNOSIS — I080 Rheumatic disorders of both mitral and aortic valves: Secondary | ICD-10-CM | POA: Diagnosis not present

## 2023-04-02 DIAGNOSIS — I311 Chronic constrictive pericarditis: Secondary | ICD-10-CM | POA: Diagnosis not present

## 2023-04-02 DIAGNOSIS — R202 Paresthesia of skin: Secondary | ICD-10-CM | POA: Diagnosis not present

## 2023-04-02 DIAGNOSIS — I10 Essential (primary) hypertension: Secondary | ICD-10-CM

## 2023-04-02 DIAGNOSIS — I483 Typical atrial flutter: Secondary | ICD-10-CM | POA: Diagnosis not present

## 2023-04-02 DIAGNOSIS — I495 Sick sinus syndrome: Secondary | ICD-10-CM | POA: Diagnosis not present

## 2023-04-02 DIAGNOSIS — N179 Acute kidney failure, unspecified: Secondary | ICD-10-CM | POA: Diagnosis not present

## 2023-04-02 DIAGNOSIS — I21A1 Myocardial infarction type 2: Secondary | ICD-10-CM | POA: Diagnosis not present

## 2023-04-02 DIAGNOSIS — W57XXXA Bitten or stung by nonvenomous insect and other nonvenomous arthropods, initial encounter: Secondary | ICD-10-CM | POA: Diagnosis present

## 2023-04-02 DIAGNOSIS — I5032 Chronic diastolic (congestive) heart failure: Secondary | ICD-10-CM | POA: Insufficient documentation

## 2023-04-02 DIAGNOSIS — Z9049 Acquired absence of other specified parts of digestive tract: Secondary | ICD-10-CM | POA: Diagnosis not present

## 2023-04-02 DIAGNOSIS — I110 Hypertensive heart disease with heart failure: Secondary | ICD-10-CM | POA: Diagnosis not present

## 2023-04-02 DIAGNOSIS — Z8249 Family history of ischemic heart disease and other diseases of the circulatory system: Secondary | ICD-10-CM | POA: Diagnosis not present

## 2023-04-02 DIAGNOSIS — I4819 Other persistent atrial fibrillation: Secondary | ICD-10-CM

## 2023-04-02 DIAGNOSIS — I5022 Chronic systolic (congestive) heart failure: Secondary | ICD-10-CM | POA: Diagnosis not present

## 2023-04-02 DIAGNOSIS — I519 Heart disease, unspecified: Secondary | ICD-10-CM | POA: Diagnosis not present

## 2023-04-02 DIAGNOSIS — I509 Heart failure, unspecified: Secondary | ICD-10-CM | POA: Diagnosis not present

## 2023-04-02 DIAGNOSIS — I471 Supraventricular tachycardia, unspecified: Secondary | ICD-10-CM | POA: Diagnosis not present

## 2023-04-02 DIAGNOSIS — I4891 Unspecified atrial fibrillation: Secondary | ICD-10-CM | POA: Diagnosis present

## 2023-04-02 DIAGNOSIS — Z825 Family history of asthma and other chronic lower respiratory diseases: Secondary | ICD-10-CM | POA: Diagnosis not present

## 2023-04-02 DIAGNOSIS — Z79899 Other long term (current) drug therapy: Secondary | ICD-10-CM | POA: Diagnosis not present

## 2023-04-02 DIAGNOSIS — I4719 Other supraventricular tachycardia: Secondary | ICD-10-CM | POA: Diagnosis not present

## 2023-04-02 DIAGNOSIS — K769 Liver disease, unspecified: Secondary | ICD-10-CM | POA: Diagnosis not present

## 2023-04-02 DIAGNOSIS — I484 Atypical atrial flutter: Secondary | ICD-10-CM | POA: Diagnosis not present

## 2023-04-02 DIAGNOSIS — Z7901 Long term (current) use of anticoagulants: Secondary | ICD-10-CM | POA: Diagnosis not present

## 2023-04-02 DIAGNOSIS — S70361A Insect bite (nonvenomous), right thigh, initial encounter: Secondary | ICD-10-CM | POA: Diagnosis present

## 2023-04-02 LAB — CBC
HCT: 39.4 % (ref 36.0–46.0)
Hemoglobin: 13.3 g/dL (ref 12.0–15.0)
MCH: 31.4 pg (ref 26.0–34.0)
MCHC: 33.8 g/dL (ref 30.0–36.0)
MCV: 92.9 fL (ref 80.0–100.0)
Platelets: 301 10*3/uL (ref 150–400)
RBC: 4.24 MIL/uL (ref 3.87–5.11)
RDW: 15.6 % — ABNORMAL HIGH (ref 11.5–15.5)
WBC: 9.3 10*3/uL (ref 4.0–10.5)
nRBC: 0.2 % (ref 0.0–0.2)

## 2023-04-02 LAB — TROPONIN I (HIGH SENSITIVITY)
Troponin I (High Sensitivity): 18 ng/L — ABNORMAL HIGH (ref <18)
Troponin I (High Sensitivity): 33 ng/L — ABNORMAL HIGH (ref <18)

## 2023-04-02 LAB — BASIC METABOLIC PANEL
Anion gap: 13 (ref 5–15)
BUN: 11 mg/dL (ref 8–23)
CO2: 24 mmol/L (ref 22–32)
Calcium: 9.4 mg/dL (ref 8.9–10.3)
Chloride: 98 mmol/L (ref 98–111)
Creatinine, Ser: 1.19 mg/dL — ABNORMAL HIGH (ref 0.44–1.00)
GFR, Estimated: 46 mL/min — ABNORMAL LOW (ref >60)
Glucose, Bld: 104 mg/dL — ABNORMAL HIGH (ref 70–99)
Potassium: 4.2 mmol/L (ref 3.5–5.1)
Sodium: 135 mmol/L (ref 135–145)

## 2023-04-02 LAB — PROTIME-INR
INR: 2.9 — ABNORMAL HIGH (ref 0.8–1.2)
Prothrombin Time: 30.1 seconds — ABNORMAL HIGH (ref 11.4–15.2)

## 2023-04-02 LAB — MAGNESIUM: Magnesium: 1.9 mg/dL (ref 1.7–2.4)

## 2023-04-02 MED ORDER — METOPROLOL SUCCINATE ER 25 MG PO TB24: 25.0000 mg | ORAL_TABLET | Freq: Two times a day (BID) | ORAL | Status: DC

## 2023-04-02 MED ORDER — MAGNESIUM SULFATE IN D5W 1-5 GM/100ML-% IV SOLN
1.0000 g | Freq: Once | INTRAVENOUS | Status: AC
  Administered 2023-04-02: 1 g via INTRAVENOUS
  Filled 2023-04-02: qty 100

## 2023-04-02 MED ORDER — LACTATED RINGERS IV BOLUS
1000.0000 mL | Freq: Once | INTRAVENOUS | Status: DC
  Administered 2023-04-02: 1000 mL via INTRAVENOUS

## 2023-04-02 MED ORDER — RIVAROXABAN 20 MG PO TABS
20.0000 mg | ORAL_TABLET | Freq: Every day | ORAL | Status: DC
  Administered 2023-04-02 – 2023-04-07 (×6): 20 mg via ORAL
  Filled 2023-04-02 (×6): qty 1

## 2023-04-02 MED ORDER — DILTIAZEM HCL-DEXTROSE 125-5 MG/125ML-% IV SOLN (PREMIX)
5.0000 mg/h | INTRAVENOUS | Status: DC
  Administered 2023-04-02 (×2): 10 mg/h via INTRAVENOUS
  Administered 2023-04-02: 5 mg/h via INTRAVENOUS
  Administered 2023-04-03: 12.5 mg/h via INTRAVENOUS
  Administered 2023-04-03: 10 mg/h via INTRAVENOUS
  Administered 2023-04-03: 5 mg/h via INTRAVENOUS
  Filled 2023-04-02 (×3): qty 125

## 2023-04-02 MED ORDER — METOPROLOL SUCCINATE ER 25 MG PO TB24
25.0000 mg | ORAL_TABLET | Freq: Two times a day (BID) | ORAL | Status: DC
  Administered 2023-04-02 – 2023-04-08 (×9): 25 mg via ORAL
  Filled 2023-04-02 (×12): qty 1

## 2023-04-02 MED ORDER — BACITRACIN ZINC 500 UNIT/GM EX OINT: TOPICAL_OINTMENT | CUTANEOUS | Status: DC | PRN

## 2023-04-02 MED ORDER — MELATONIN 3 MG PO TABS
3.0000 mg | ORAL_TABLET | Freq: Every evening | ORAL | Status: DC | PRN
  Filled 2023-04-02: qty 1

## 2023-04-02 MED ORDER — SODIUM CHLORIDE 0.9 % IV SOLN: INTRAVENOUS | Status: DC

## 2023-04-02 MED ORDER — DILTIAZEM HCL 60 MG PO TABS: 60.0000 mg | ORAL_TABLET | Freq: Two times a day (BID) | ORAL | 0 refills | Status: DC | PRN

## 2023-04-02 MED ORDER — DILTIAZEM HCL 25 MG/5ML IV SOLN
10.0000 mg | Freq: Once | INTRAVENOUS | Status: AC
  Administered 2023-04-02: 10 mg via INTRAVENOUS
  Filled 2023-04-02: qty 5

## 2023-04-02 MED ORDER — POLYETHYLENE GLYCOL 3350 17 G PO PACK: 17.0000 g | PACK | Freq: Every day | ORAL | Status: DC | PRN

## 2023-04-02 MED ORDER — ACETAMINOPHEN 325 MG PO TABS
650.0000 mg | ORAL_TABLET | ORAL | Status: DC | PRN
Start: 2023-04-02 — End: 2023-04-02

## 2023-04-02 MED ORDER — LACTATED RINGERS IV BOLUS
1000.0000 mL | Freq: Once | INTRAVENOUS | Status: AC
  Administered 2023-04-02: 1000 mL via INTRAVENOUS

## 2023-04-02 MED ORDER — ONDANSETRON HCL 4 MG/2ML IJ SOLN: 4.0000 mg | Freq: Four times a day (QID) | INTRAMUSCULAR | Status: DC | PRN

## 2023-04-02 MED ORDER — ACETAMINOPHEN 325 MG PO TABS: 650.0000 mg | ORAL_TABLET | ORAL | Status: DC | PRN

## 2023-04-02 NOTE — ED Notes (Signed)
Pt ambulated to bathroom, HR raised to 115 and blood pressure to 143/72. Pt had no complaints and steady gait
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
## 2023-04-02 NOTE — H&P (Addendum)
History and Physical    Patient: Yolanda Bell:[id number] DOB: 04/02/1941 DOA: 04/01/2023 DOS: the patient was seen and examined on 04/02/2023 PCP: Moshe Cipro, NP  Patient coming from: Home  Chief Complaint:  Chief Complaint  Patient presents with   Tachycardia   HPI: Yolanda Bell is a 82 y.o. female with medical history significant of Paroxysmal Atrial Fibrillation on Xarelto, constrictive pericarditis with perciardial stripping at [facility], HTN, liver disease, and spasmodic dystonia. She has had multiple cardioversions and ablations in the past.   She presented to [facility] last night for palpitations and tachycardia. She took 3 doses of PRN propanolol Friday throughout the day without improvement in HR and was afraid to go to sleep with her elevated HR. She reports HR was sustaining above 125 each time she checked. She denies dyspnea, dizziness, fatigue, abdominal pain, nausea/vomiting.  She was started on a Diltiazem infusion in the ED and Cardiology was consulted.  Hospitalists contacted for admission.    Review of Systems: As mentioned in the history of present illness. All other systems reviewed and are negative. Past Medical History:  Diagnosis Date   Atrial fibrillation (HCC)    Status post TEE cardioversion   Constrictive pericarditis    2 status post pericardial stripping at the [facility]   Gout    Hypertension    Spastic dysphonia    Supraventricular tachycardia    Status post RF ablation by Dr. Anabel Halon   Past Surgical History:  Procedure Laterality Date   ABDOMINAL HYSTERECTOMY     1987   BOWEL RESECTION N/A 06/03/2022   Procedure: SMALL BOWEL RESECTION;  Surgeon: Gaynelle Adu, MD;  Location: [facility] OR;  Service: General;  Laterality: N/A;   CARDIOVERSION  11/21/2012   Procedure: CARDIOVERSION;  Surgeon: [name], MD;  Location: [facility] ENDOSCOPY;  Service: Cardiovascular;;   CARDIOVERSION N/A 06/10/2014   Procedure: CARDIOVERSION;  Surgeon: Vesta Mixer, MD;  Location: [facility] ENDOSCOPY;  Service: Cardiovascular;  Laterality: N/A;   CARDIOVERSION N/A 10/14/2015   Procedure: CARDIOVERSION;  Surgeon: Wendall Stade, MD;  Location: [facility] ENDOSCOPY;  Service: Cardiovascular;  Laterality: N/A;   CARDIOVERSION N/A 05/12/2016   Procedure: CARDIOVERSION;  Surgeon: Chilton Si, MD;  Location: [facility] ENDOSCOPY;  Service: Cardiovascular;  Laterality: N/A;   CARDIOVERSION N/A 04/09/2022   Procedure: CARDIOVERSION;  Surgeon: Parke Poisson, MD;  Location: [facility] ENDOSCOPY;  Service: Cardiovascular;  Laterality: N/A;   CHOLECYSTECTOMY     1989   ELECTROPHYSIOLOGIC STUDY N/A 08/20/2016   Procedure: Atrial Fibrillation Ablation;  Surgeon: Will Jorja Loa, MD;  Location: [facility] INVASIVE CV LAB;  Service: Cardiovascular;  Laterality: N/A;   EYE SURGERY     cataracts   LAPAROTOMY N/A 06/03/2022   Procedure: EXPLORATORY LAPAROTOMY;  Surgeon: Gaynelle Adu, MD;  Location: [facility] OR;  Service: General;  Laterality: N/A;   PERICARDIECTOMY     TEE WITHOUT CARDIOVERSION N/A 06/10/2014   Procedure: TRANSESOPHAGEAL ECHOCARDIOGRAM (TEE);  Surgeon: Vesta Mixer, MD;  Location: [facility] ENDOSCOPY;  Service: Cardiovascular;  Laterality: N/A;   TONSILLECTOMY     Social History:  reports that she has never smoked. She has never used smokeless tobacco. She reports current alcohol use. She reports that she does not use drugs.  Allergies  Allergen Reactions   Corticosteroids Other (See Comments)    Liver scarring as a child   Covid-19 (Mrna) Vaccine Other (See Comments)    Postural Orthostatic Tachycardia Syndrome (POTS)   Other Other (See Comments)  Cannot take STEROIDS-unknown reaction-caused by internal scarring on liver.    Anesthesia precaution due to excessive liver scarring    Pacerone [Amiodarone] Other (See Comments)    Dizziness Visual hallucinations   Erythromycin Nausea And Vomiting and Rash   Zyloprim [Allopurinol] Rash and Other (See Comments)    Skin  peeling More severe gout     Family History  Problem Relation Age of Onset   Heart disease Mother    Emphysema Father    Breast cancer Sister     Prior to Admission medications   Medication Sig Start Date End Date Taking? Authorizing Provider  Ascorbic Acid (VITAMIN C PO) Take 1 tablet by mouth daily.   Yes [provider]  Calcium Carb-Cholecalciferol (CALCIUM + VITAMIN D3 PO) Take 1 tablet by mouth daily.   Yes [provider]  denosumab (PROLIA) 60 MG/ML SOSY injection Inject 60 mg into the skin every 6 (six) months.   Yes [provider]  DENTA 5000 PLUS 1.1 % CREA dental cream Place 1 application  onto teeth 2 (two) times daily. 05/31/21  Yes [provider]  diltiazem (CARDIZEM) 60 MG tablet Take 1 tablet (60 mg total) by mouth every 12 (twelve) hours as needed (for heart rate > 110 bpm). 04/02/23  Yes Sponseller, Rebekah R, PA-C  metoprolol succinate (TOPROL-XL) 25 MG 24 hr tablet Take one tablet by mouth (25 mg) twice a day. 04/09/22  Yes Parke Poisson, MD  propranolol (INDERAL) 10 MG tablet TAKE 1 TABLET BY MOUTH 4 TIMES A DAY AS NEEDED Patient taking differently: Take 10 mg by mouth 4 (four) times daily as needed (for heart rate of 130). 04/30/22  Yes Nahser, Deloris Ping, MD  spironolactone (ALDACTONE) 50 MG tablet TAKE 1 TABLET BY MOUTH EVERY DAY Patient taking differently: Take 50 mg by mouth daily. 11/18/22  Yes Nahser, Deloris Ping, MD  XARELTO 20 MG TABS tablet TAKE 1 TABLET BY MOUTH DAILY WITH SUPPER 11/26/22  Yes Nahser, Deloris Ping, MD    Physical Exam: Vitals:   04/02/23 0430 04/02/23 0500 04/02/23 0530 04/02/23 0600  BP: (!) 100/59 111/62 114/66 127/75  Pulse: 71 67 80 87  Resp: 17 14 14 14   Temp:      TempSrc:      SpO2: 99% 99% 100% 99%  Weight:      Height:       Constitutional: NAD, calm, comfortable Eyes: PERRL, lids and conjunctivae normal ENMT: Mucous membranes are moist. Posterior pharynx clear of any exudate or  lesions. Neck: normal, supple, no masses, no thyromegaly Respiratory: clear to auscultation bilaterally, no wheezing, no crackles. Normal respiratory effort. No accessory muscle use.  Cardiovascular: Regular rate and irregular rhythm, no murmurs / rubs / gallops. No extremity edema. 2+ radial/pedal pulses. No carotid bruits.  Abdomen: no tenderness, no masses palpated. No hepatosplenomegaly. Bowel sounds positive x4.  Musculoskeletal: no clubbing / cyanosis. No joint deformity upper and lower extremities. Good ROM, no contractures. Normal muscle tone.  Skin: no rashes, lesions, ulcers. No induration. Bruising. Small red macule -->Bug bite (see pic below,bite is to the right and below electrode) Neurologic: CN 2-12 grossly intact. Sensation intact. Strength 5/5 x all 4 extremities.  Psychiatric: Normal judgment and insight. Alert and oriented x 3. Normal mood.           Data Reviewed: CBC    Component Value Date/Time   WBC 9.3 04/01/2023 2352   RBC 4.24 04/01/2023 2352   HGB 13.3 04/01/2023 2352  HGB 12.5 10/20/2022 1128   HCT 39.4 04/01/2023 2352   HCT 37.2 10/20/2022 1128   PLT 301 04/01/2023 2352   PLT 193 10/20/2022 1128   MCV 92.9 04/01/2023 2352   MCV 88 10/20/2022 1128   MCH 31.4 04/01/2023 2352   MCHC 33.8 04/01/2023 2352   RDW 15.6 (H) 04/01/2023 2352   RDW 14.5 10/20/2022 1128   LYMPHSABS 1.6 06/08/2022 0130   MONOABS 0.3 06/08/2022 0130   EOSABS 0.1 06/08/2022 0130   BASOSABS 0.0 06/08/2022 0130   CMP     Component Value Date/Time   NA 135 04/01/2023 2352   NA 137 10/20/2022 1128   K 4.2 04/01/2023 2352   CL 98 04/01/2023 2352   CO2 24 04/01/2023 2352   GLUCOSE 104 (H) 04/01/2023 2352   BUN 11 04/01/2023 2352   BUN 9 10/20/2022 1128   CREATININE 1.19 (H) 04/01/2023 2352   CREATININE 0.90 08/12/2016 1022   CALCIUM 9.4 04/01/2023 2352   PROT 5.3 (L) 06/11/2022 0310   PROT 6.9 11/21/2019 1035   ALBUMIN 2.5 (L) 06/11/2022 0310   ALBUMIN 4.5 11/21/2019  1035   AST 25 06/11/2022 0310   ALT 20 06/11/2022 0310   ALKPHOS 66 06/11/2022 0310   BILITOT 0.4 06/11/2022 0310   BILITOT 0.9 11/21/2019 1035   GFR 62.31 06/07/2014 0958   EGFR 77 10/20/2022 1128   GFRNONAA 46 (L) 04/01/2023 2352   Magnesium    Component Value Date/Time   MAGNESIUM 1.9 04/01/2023 2352   Cardiac Panel (last 3 results) Recent Labs    04/01/23 2352 04/02/23 0254  TROPONINIHS 18* 33*    PT/INR    Component Value Date/Time   Prothrombin Time INR 30.1 2.9 04/01/2023 2352 04/01/2023 2352    Assessment and Plan: #Atrial Flutter with Rapid Ventricular Response - Continue diltiazem drip - Continue home Xarelto - Continue home Metoprolol - Repeat ECHO, last was in Sept 2022--> LVEF 50-55%, moderate TR. - Goal K>4, Mg >2 - TSH with AM labs - Cardiology consulted, appreciate their recommendations  #AKI Creatinine 1.19 on AM BMP (previously ~0.6-0.8) - Continue IV fluid hydration - Hold home spironolactone  #Hypertension - Continue home Metoprolol  #PMH Liver Disease Specific diagnosis and details are unclear. Allergy list includes Corticosteroids as causing liver scarring as a child. Takes spironolactone at home for this. - Hold home spironolactone as above  #Insect Bite Reports a bite on her (R) inner thigh from a small red bug last Sunday. There is a small red macule on exam, no surrounding edema or inflammation. She denies headache, fatigue, joint stiffness, neck stiffness, flu-like symptoms, and no lymphadenopathy on exam.  - Bacitracin PRN - Monitor  VTE prophylaxis: Continue home Xarelto GI prophylaxis: Protonix Diet: Regular Access: PIV Lines: NONE Telemetry: Yes Disposition: Admit to Progressive   Advance Care Planning: Full Code  Consults: Cardiology  Family Communication: No family at bedside  Severity of Illness: The appropriate patient status for this patient is INPATIENT. Inpatient status is judged to be reasonable and  necessary in order to provide the required intensity of service to ensure the patient's safety. The patient's presenting symptoms, physical exam findings, and initial radiographic and laboratory data in the context of their chronic comorbidities is felt to place them at high risk for further clinical deterioration. Furthermore, it is not anticipated that the patient will be medically stable for discharge from the hospital within 2 midnights of admission.   * I certify that at the point of admission  it is my clinical judgment that the patient will require inpatient hospital care spanning beyond 2 midnights from the point of admission due to high intensity of service, high risk for further deterioration and high frequency of surveillance required.*  To reach the provider On-Call:   7AM- 7PM see care teams to locate the attending and reach out to them via [email or web address]. Password: [email or web address] 7PM-7AM contact night-coverage If you still have difficulty reaching the appropriate provider, please page the [facility] (Director on Call) for [facility] on amion for assistance  This document was prepared using Conservation officer, historic buildings and may include unintentional dictation errors.  Bishop Limbo FNP-BC, PMHNP-BC Nurse Practitioner [facility]

## 2023-04-02 NOTE — ED Provider Notes (Signed)
[place] EMERGENCY DEPARTMENT AT [facility] Provider Note   CSN: [id number] Arrival date & time: 04/01/23  2339     History  Chief Complaint  Patient presents with   Tachycardia    Yolanda Bell is a 82 y.o. female with history of paroxysmal atrial fibrillation anticoagulated on Xarelto with good medication compliance who presents with concern for tachycardia.  Patient has been had multiple cardioversions in the past as well as 2 ablations.  History of constrictive pericarditis with pericardial stripping at [facility] in the past.  She denies lightheadedness, chest pain, shortness of breath, or syncope but does endorse tachycardia that started this afternoon and persisted.  States that she was afraid to go to bed by herself due to persistent tachycardia.  States that she has taken 3 doses of her as needed propranolol today without improvement in her tachycardia and was directed to the emergency department for further evaluation.  In addition to the above listed history she has history of hypertension and spastic dysphonia.  HPI     Home Medications Prior to Admission medications   Medication Sig Start Date End Date Taking? Authorizing Provider  Ascorbic Acid (VITAMIN C PO) Take 1 tablet by mouth daily.   Yes [provider]  Calcium Carb-Cholecalciferol (CALCIUM + VITAMIN D3 PO) Take 1 tablet by mouth daily.   Yes [provider]  denosumab (PROLIA) 60 MG/ML SOSY injection Inject 60 mg into the skin every 6 (six) months.   Yes [provider]  DENTA 5000 PLUS 1.1 % CREA dental cream Place 1 application  onto teeth 2 (two) times daily. 05/31/21  Yes [provider]  diltiazem (CARDIZEM) 60 MG tablet Take 1 tablet (60 mg total) by mouth every 12 (twelve) hours as needed (for heart rate > 110 bpm). 04/02/23  Yes Sumiko Ceasar R, PA-C  metoprolol succinate (TOPROL-XL) 25 MG 24 hr tablet Take one tablet by mouth (25 mg) twice a day.  04/09/22  Yes Parke Poisson, MD  propranolol (INDERAL) 10 MG tablet TAKE 1 TABLET BY MOUTH 4 TIMES A DAY AS NEEDED Patient taking differently: Take 10 mg by mouth 4 (four) times daily as needed (for heart rate of 130). 04/30/22  Yes Nahser, Deloris Ping, MD  spironolactone (ALDACTONE) 50 MG tablet TAKE 1 TABLET BY MOUTH EVERY DAY Patient taking differently: Take 50 mg by mouth daily. 11/18/22  Yes Nahser, Deloris Ping, MD  XARELTO 20 MG TABS tablet TAKE 1 TABLET BY MOUTH DAILY WITH SUPPER 11/26/22  Yes Nahser, Deloris Ping, MD      Allergies    Corticosteroids, Covid-19 (mrna) vaccine, Other, Pacerone [amiodarone], Erythromycin, and Zyloprim [allopurinol]    Review of Systems   Review of Systems  Constitutional: Negative.   HENT: Negative.    Respiratory: Negative.    Cardiovascular:  Positive for palpitations.  Gastrointestinal: Negative.   Genitourinary: Negative.     Physical Exam Updated Vital Signs BP 111/62   Pulse 67   Temp 98.7 F (37.1 C) (Oral)   Resp 14   Ht 5\' 2"  (1.575 m)   Wt 61 kg   SpO2 99%   BMI 24.60 kg/m  Physical Exam Vitals and nursing note reviewed.  Constitutional:      Appearance: She is not ill-appearing or toxic-appearing.  HENT:     Head: Normocephalic and atraumatic.     Nose: Nose normal.     Mouth/Throat:     Mouth: Mucous membranes are moist.  Pharynx: No oropharyngeal exudate or posterior oropharyngeal erythema.  Eyes:     General:        Right eye: No discharge.        Left eye: No discharge.     Conjunctiva/sclera: Conjunctivae normal.  Cardiovascular:     Rate and Rhythm: Tachycardia present. Rhythm irregular.     Pulses: Normal pulses.     Heart sounds: Normal heart sounds. No murmur heard. Pulmonary:     Effort: Pulmonary effort is normal. No respiratory distress.     Breath sounds: Normal breath sounds. No wheezing or rales.  Abdominal:     General: Bowel sounds are normal. There is no distension.     Palpations: Abdomen is soft.      Tenderness: There is no abdominal tenderness. There is no guarding or rebound.  Musculoskeletal:        General: No deformity or signs of injury.     Cervical back: Neck supple.     Right lower leg: No edema.     Left lower leg: No edema.  Skin:    General: Skin is warm and dry.     Capillary Refill: Capillary refill takes less than 2 seconds.  Neurological:     General: No focal deficit present.     Mental Status: She is alert and oriented to person, place, and time. Mental status is at baseline.  Psychiatric:        Mood and Affect: Mood normal.     ED Results / Procedures / Treatments   Labs (all labs ordered are listed, but only abnormal results are displayed) Labs Reviewed  BASIC METABOLIC PANEL - Abnormal; Notable for the following components:      Result Value   Glucose, Bld 104 (*)    Creatinine, Ser 1.19 (*)    GFR, Estimated 46 (*)    All other components within normal limits  CBC - Abnormal; Notable for the following components:   RDW 15.6 (*)    All other components within normal limits  PROTIME-INR - Abnormal; Notable for the following components:   Prothrombin Time 30.1 (*)    INR 2.9 (*)    All other components within normal limits  TROPONIN I (HIGH SENSITIVITY) - Abnormal; Notable for the following components:   Troponin I (High Sensitivity) 18 (*)    All other components within normal limits  TROPONIN I (HIGH SENSITIVITY) - Abnormal; Notable for the following components:   Troponin I (High Sensitivity) 33 (*)    All other components within normal limits  MAGNESIUM    EKG EKG Interpretation  Date/Time:  Saturday April 02 2023 01:45:04 EDT Ventricular Rate:  69 PR Interval:  178 QRS Duration: 98 QT Interval:  446 QTC Calculation: 478 R Axis:   91 Text Interpretation: Atrial flutter with predominant 4:1 AV block Right axis deviation Nonspecific T abnrm, anterolateral leads Minimal ST elevation, inferior leads Confirmed by Kennis Carina [phone]) on  04/02/2023 1:57:53 AM  Radiology DG Chest 1 View  Result Date: 04/02/2023 CLINICAL DATA:  Chest pain EXAM: CHEST  1 VIEW COMPARISON:  Radiograph 06/20/2021 FINDINGS: Stable cardiomegaly. Aortic atherosclerotic calcification. Bibasilar atelectasis/scarring. Chronic bronchitic changes. No focal pneumonia, pleural effusion, or pneumothorax. Chronic posttraumatic deformities both humeral heads. No acute displaced rib fracture. IMPRESSION: No active disease. Electronically Signed   By: Minerva Fester M.D.   On: 04/02/2023 00:25    Procedures .Critical Care  Performed by: Paris Lore, PA-C Authorized by: Paris Lore, PA-C  Critical care provider statement:    Critical care time (minutes):  30   Critical care was time spent personally by me on the following activities:  Development of treatment plan with patient or surrogate, discussions with consultants, evaluation of patient's response to treatment, examination of patient, ordering and review of laboratory studies, ordering and review of radiographic studies, ordering and performing treatments and interventions, pulse oximetry, re-evaluation of patient's condition and review of old charts     Medications Ordered in ED Medications  0.9 %  sodium chloride infusion ( Intravenous New Bag/Given 04/02/23 0335)  diltiazem (CARDIZEM) 125 mg in dextrose 5% 125 mL (1 mg/mL) infusion (10 mg/hr Intravenous Rate/Dose Change 04/02/23 0402)  lactated ringers bolus 1,000 mL (0 mLs Intravenous Stopped 04/02/23 0256)  diltiazem (CARDIZEM) injection 10 mg (10 mg Intravenous Given 04/02/23 0105)    ED Course/ Medical Decision Making/ A&P Clinical Course as of 04/02/23 0615  Sat Apr 02, 2023  0223 Consult to cardiology fellow Dr. Jayme Cloud, who states that as patient is asymptomatic she can be discharged home to follow-up with electrophysiology in the clinic, however regarding symptomatic treatment of recurring tachycardia at home, suggests as  needed prescription for diltiazem 60 mg every 12 hours as needed for heart rate over 110 bpm.  Appreciate his collaboration in the care of this patient. [RS]  2725 Patient asymptomatic, resting calmly in her bed.  [RS]  36 EDP Dr. Pilar Plate spoke with hospitalist Dr. Maisie Fus; patient accepted to their service, however will be seen and formally admitted by the AM team. ' [RS]    Clinical Course User Index [RS] Jarold Macomber, Eugene Gavia, PA-C                             Medical Decision Making 83 year old female presents with concern for rapid heart rate.  Tachycardic to the 140s on intake.  Cardiac exam with tachycardia with regular rhythm pulmonary signs unremarkable, no lower extremity edema.  Patient well-appearing with GCS 15.  DDx includes is limited to a flutter, atrial fibrillation with rapid ventricular response, SVT, metabolic derangement, heart block.   Amount and/or Complexity of Data Reviewed Labs: ordered.    Details: CBC without leukocytosis or anemia, BMP with mild elevation in creatinine to 1.1 from patient's baseline of 0.7.  INR to 2.9 in context of anticoagulation on Xarelto.  Magnesium is normal.  Troponin mildly elevated at 18, repeat troponin elevated to 33.  Radiology:     Details:  Chest x-ray negative for acute cardiopulmonary disease.   Risk Prescription drug management. Decision regarding hospitalization.   Patient initially with EKG that appeared to be atrial flutter with tachycardia with heart rate in the 130s.  Administer dose of diltiazem with resolution of her tachycardia though she persistent atrial flutter.  Was monitored in the emergency department for a few hours, completely asymptomatic at that time.  Discussion with cardiology regarding adjustments in outpatient medications for episodic tachycardia, however unfortunately patient ultimately regressed and developed atrial flutter again.  Placed on Cardizem infusion with resolution of her tachycardia.  Patient  currently asymptomatic, however given requirement for continuous infusion will decision was made to proceed with hospital admission at this time.  Consult to hospitalist as above.  Prisma  voiced understanding of her medical evaluation and treatment plan. Each of their questions answered to their expressed satisfaction.  Return precautions were given.   This chart was dictated using voice recognition software, Dragon. Despite the  best efforts of this provider to proofread and correct errors, errors may still occur which can change documentation meaning.   Final Clinical Impression(s) / ED Diagnoses Final diagnoses:  Typical atrial flutter (HCC)  Atrial fibrillation with RVR (HCC)    Rx / DC Orders ED Discharge Orders          Ordered    Amb referral to AFIB Clinic  Status:  Canceled        04/02/23 0041    diltiazem (CARDIZEM) 60 MG tablet  Every 12 hours PRN        04/02/23 0300              Kito Cuffe, Eugene Gavia, PA-C 04/02/23 0616    Sabas Sous, MD 04/02/23 [phone]

## 2023-04-02 NOTE — ED Notes (Signed)
ED TO INPATIENT HANDOFF REPORT  ED Nurse Name and Phone #: [place], [place]  S Name/Age/Gender Yolanda Bell 82 y.o. female Room/Bed: 031C/031C  Code Status   Code Status: Prior  Home/SNF/Other Home Patient oriented to: self, place, time, and situation Is this baseline? Yes   Triage Complete: Triage complete  Chief Complaint A-fib [facility]) [I48.91]  Triage Note Pt c/o tachycardia all day today without relief from propanolol. Pt states that she had a recent episode on 6/17 as well. Has hx of SVT   Allergies Allergies  Allergen Reactions   Corticosteroids Other (See Comments)    Liver scarring as a child   Covid-19 (Mrna) Vaccine Other (See Comments)    Postural Orthostatic Tachycardia Syndrome (POTS)   Other Other (See Comments)    Cannot take STEROIDS-unknown reaction-caused by internal scarring on liver.    Anesthesia precaution due to excessive liver scarring    Pacerone [Amiodarone] Other (See Comments)    Dizziness Visual hallucinations   Erythromycin Nausea And Vomiting and Rash   Zyloprim [Allopurinol] Rash and Other (See Comments)    Skin peeling More severe gout     Level of Care/Admitting Diagnosis ED Disposition     ED Disposition  Admit   Condition  --   Comment  Hospital Area: [facility] [[id number]]  Level of Care: Progressive [102]  Admit to Progressive based on following criteria: CARDIOVASCULAR & THORACIC of moderate stability with acute coronary syndrome symptoms/low risk myocardial infarction/hypertensive urgency/arrhythmias/heart failure potentially compromising stability and stable post cardiovascular intervention patients.  May admit patient to [facility] or [facility] if equivalent level of care is available:: No  Covid Evaluation: Asymptomatic - no recent exposure (last 10 days) testing not required  Diagnosis: A-fib [name]) [[id number]]  Admitting Physician: Lurline Del [[id number]]  Attending Physician: Lurline Del  [[id number]]  Certification:: I certify this patient will need inpatient services for at least 2 midnights  Estimated Length of Stay: 3          B Medical/Surgery History Past Medical History:  Diagnosis Date   Atrial fibrillation (HCC)    Status post TEE cardioversion   Constrictive pericarditis    2 status post [facility]ial stripping [facility]   Gout    Hypertension    Spastic dysphonia    Supraventricular tachycardia    Status post RF ablation by Dr. Anabel Halon   Past Surgical History:  Procedure Laterality Date   ABDOMINAL HYSTERECTOMY     1987   BOWEL RESECTION N/A 06/03/2022   Procedure: SMALL BOWEL RESECTION;  Surgeon: Gaynelle Adu, MD;  Location[facility] OR;  Service: General;  Laterality: N/A;   CARDIOVERSION  11/21/2012   Procedure: CARDIOVERSION;  Surgeon: [name], MD;  Location: [facility] ENDOSCOPY;  Service: Cardiovascular;;   CARDIOVERSION N/A 06/10/2014   Procedure: CARDIOVERSION;  Surgeon: Vesta Mixer, MD;  Location: [facility] ENDOSCOPY;  Service: Cardiovascular;  Laterality: N/A;   CARDIOVERSION N/A 10/14/2015   Procedure: CARDIOVERSION;  Surgeon: Wendall Stade, MD;  Location: [facility] ENDOSCOPY;  Service: Cardiovascular;  Laterality: N/A;   CARDIOVERSION N/A 05/12/2016   Procedure: CARDIOVERSION;  Surgeon: Chilton Si, MD;  Location: [facility] ENDOSCOPY;  Service: Cardiovascular;  Laterality: N/A;   CARDIOVERSION N/A 04/09/2022   Procedure: CARDIOVERSION;  Surgeon: Parke Poisson, MD;  Location: [facility] ENDOSCOPY;  Service: Cardiovascular;  Laterality: N/A;   CHOLECYSTECTOMY     1989   ELECTROPHYSIOLOGIC STUDY N/A 08/20/2016   Procedure: Atrial Fibrillation Ablation;  Surgeon:  Will Jorja Loa, MD;  Location: [facility] INVASIVE CV LAB;  Service: Cardiovascular;  Laterality: N/A;   EYE SURGERY     cataracts   LAPAROTOMY N/A 06/03/2022   Procedure: EXPLORATORY LAPAROTOMY;  Surgeon: Gaynelle Adu, MD;  Location: [facility] OR;  Service: General;  Laterality: N/A;   PERICARDIECTOMY      TEE WITHOUT CARDIOVERSION N/A 06/10/2014   Procedure: TRANSESOPHAGEAL ECHOCARDIOGRAM (TEE);  Surgeon: Vesta Mixer, MD;  Location: [facility] ENDOSCOPY;  Service: Cardiovascular;  Laterality: N/A;   TONSILLECTOMY       A IV Location/Drains/Wounds Patient Lines/Drains/Airways Status     Active Line/Drains/Airways     Name Placement date Placement time Site Days   Peripheral IV 04/02/23 20 G Anterior;Right Forearm 04/02/23  0026  Forearm  less than 1            Intake/Output Last 24 hours No intake or output data in the 24 hours ending 04/02/23 0620  Labs/Imaging Results for orders placed or performed during the hospital encounter of 04/01/23 (from the past 48 hour(s))  Basic metabolic panel     Status: Abnormal   Collection Time: 04/01/23 11:52 PM  Result Value Ref Range   Sodium 135 135 - 145 mmol/L   Potassium 4.2 3.5 - 5.1 mmol/L   Chloride 98 98 - 111 mmol/L   CO2 24 22 - 32 mmol/L   Glucose, Bld 104 (H) 70 - 99 mg/dL    Comment: Glucose reference range applies only to samples taken after fasting for at least 8 hours.   BUN 11 8 - 23 mg/dL   Creatinine, Ser 1.61 (H) 0.44 - 1.00 mg/dL   Calcium 9.4 8.9 - 09.6 mg/dL   GFR, Estimated 46 (L) >60 mL/min    Comment: (NOTE) Calculated using the CKD-EPI Creatinine Equation (2021)    Anion gap 13 5 - 15    Comment: Performed at [facility] Lab, [address] [place], [place] [address]  CBC     Status: Abnormal   Collection Time: 04/01/23 11:52 PM  Result Value Ref Range   WBC 9.3 4.0 - 10.5 K/uL   RBC 4.24 3.87 - 5.11 MIL/uL   Hemoglobin 13.3 12.0 - 15.0 g/dL   HCT 98.1 19.1 - 47.8 %   MCV 92.9 80.0 - 100.0 fL   MCH 31.4 26.0 - 34.0 pg   MCHC 33.8 30.0 - 36.0 g/dL   RDW 29.5 (H) 62.1 - 30.8 %   Platelets 301 150 - 400 K/uL   nRBC 0.2 0.0 - 0.2 %    Comment: Performed at [facility], [address] [place], [place] [address]  Troponin I (High Sensitivity)     Status: Abnormal   Collection Time: 04/01/23 11:52  PM  Result Value Ref Range   Troponin I (High Sensitivity) 18 (H) <18 ng/L    Comment: (NOTE) Elevated high sensitivity troponin I (hsTnI) values and significant  changes across serial measurements may suggest ACS but many other  chronic and acute conditions are known to elevate hsTnI results.  Refer to the "Links" section for chest pain algorithms and additional  guidance. Performed at [facility], [address], [place], [place] [address]   Magnesium     Status: None   Collection Time: 04/01/23 11:52 PM  Result Value Ref Range   Magnesium 1.9 1.7 - 2.4 mg/dL    Comment: Performed at [facility], [address] [place], [place] [address]  Protime-INR (if  pt is taking coumadin)     Status: Abnormal   Collection Time: 04/01/23 11:52 PM  Result Value Ref Range   Prothrombin Time 30.1 (H) 11.4 - 15.2 seconds   INR 2.9 (H) 0.8 - 1.2    Comment: (NOTE) INR goal varies based on device and disease states. Performed at [facility], [address] [place], [place] [address]   Troponin I (High Sensitivity)     Status: Abnormal   Collection Time: 04/02/23  2:54 AM  Result Value Ref Range   Troponin I (High Sensitivity) 33 (H) <18 ng/L    Comment: (NOTE) Elevated high sensitivity troponin I (hsTnI) values and significant  changes across serial measurements may suggest ACS but many other  chronic and acute conditions are known to elevate hsTnI results.  Refer to the "Links" section for chest pain algorithms and additional  guidance. Performed at [facility], [address] [place], [place] [address]    DG Chest 1 View  Result Date: 04/02/2023 CLINICAL DATA:  Chest pain EXAM: CHEST  1 VIEW COMPARISON:  Radiograph 06/20/2021 FINDINGS: Stable cardiomegaly. Aortic atherosclerotic calcification. Bibasilar atelectasis/scarring. Chronic bronchitic changes. No focal pneumonia, pleural effusion, or pneumothorax. Chronic posttraumatic deformities both humeral  heads. No acute displaced rib fracture. IMPRESSION: No active disease. Electronically Signed   By: Minerva Fester M.D.   On: 04/02/2023 00:25    Pending Labs Unresulted Labs (From admission, onward)    None       Vitals/Pain Today's Vitals   04/02/23 0430 04/02/23 0500 04/02/23 0530 04/02/23 0600  BP: (!) 100/59 111/62 114/66 127/75  Pulse: 71 67 80 87  Resp: 17 14 14 14   Temp:      TempSrc:      SpO2: 99% 99% 100% 99%  Weight:      Height:      PainSc:        Isolation Precautions No active isolations  Medications Medications  0.9 %  sodium chloride infusion ( Intravenous New Bag/Given 04/02/23 0335)  diltiazem (CARDIZEM) 125 mg in dextrose 5% 125 mL (1 mg/mL) infusion (10 mg/hr Intravenous Rate/Dose Change 04/02/23 0402)  lactated ringers bolus 1,000 mL (0 mLs Intravenous Stopped 04/02/23 0256)  diltiazem (CARDIZEM) injection 10 mg (10 mg Intravenous Given 04/02/23 0105)    Mobility walks     Focused Assessments Cardiac Assessment Handoff:  Cardiac Rhythm: Sinus tachycardia No results found for: "CKTOTAL", "CKMB", "CKMBINDEX", "TROPONINI" No results found for: "DDIMER" Does the Patient currently have chest pain? No    R Recommendations: See Admitting Provider Note  Report given to:   Additional Notes: Pt is A&Ox4, continent x2, ambulatory.

## 2023-04-02 NOTE — Progress Notes (Signed)
Patient's HR went down to the 50's and Cardizem drip is paused. Cardiology is notified and a verbal order is received to keep the drip paused and restart later if HR go back up and BP stable.

## 2023-04-03 ENCOUNTER — Inpatient Hospital Stay (HOSPITAL_COMMUNITY): Payer: Medicare PPO

## 2023-04-03 ENCOUNTER — Encounter (HOSPITAL_COMMUNITY)
Admission: EM | Disposition: A | Discharge status: Home / Self Care | Payer: Self-pay | Source: Home / Self Care | Attending: Internal Medicine

## 2023-04-03 DIAGNOSIS — I4892 Unspecified atrial flutter: Secondary | ICD-10-CM

## 2023-04-03 DIAGNOSIS — I4891 Unspecified atrial fibrillation: Secondary | ICD-10-CM

## 2023-04-03 LAB — COMPREHENSIVE METABOLIC PANEL
ALT: 10 U/L (ref 0–44)
AST: 14 U/L — ABNORMAL LOW (ref 15–41)
Albumin: 3.4 g/dL — ABNORMAL LOW (ref 3.5–5.0)
Alkaline Phosphatase: 37 U/L — ABNORMAL LOW (ref 38–126)
Anion gap: 7 (ref 5–15)
BUN: 14 mg/dL (ref 8–23)
CO2: 23 mmol/L (ref 22–32)
Calcium: 8.2 mg/dL — ABNORMAL LOW (ref 8.9–10.3)
Chloride: 103 mmol/L (ref 98–111)
Creatinine, Ser: 0.7 mg/dL (ref 0.44–1.00)
GFR, Estimated: >60 mL/min (ref >60)
Glucose, Bld: 91 mg/dL (ref 70–99)
Potassium: 3.8 mmol/L (ref 3.5–5.1)
Sodium: 133 mmol/L — ABNORMAL LOW (ref 135–145)
Total Bilirubin: 1 mg/dL (ref 0.3–1.2)
Total Protein: 5.6 g/dL — ABNORMAL LOW (ref 6.5–8.1)

## 2023-04-03 LAB — MAGNESIUM: Magnesium: 1.8 mg/dL (ref 1.7–2.4)

## 2023-04-03 LAB — ECHOCARDIOGRAM COMPLETE
Area-P 1/2: 3.83 cm2
Calc EF: 43.9 %
Height: 62 in
S' Lateral: 4.1 cm
Single Plane A2C EF: 40.2 %
Single Plane A4C EF: 43.7 %
Weight: 2151.69 oz

## 2023-04-03 LAB — CBC
HCT: 32.4 % — ABNORMAL LOW (ref 36.0–46.0)
Hemoglobin: 11.2 g/dL — ABNORMAL LOW (ref 12.0–15.0)
MCH: 31.2 pg (ref 26.0–34.0)
MCHC: 34.6 g/dL (ref 30.0–36.0)
MCV: 90.3 fL (ref 80.0–100.0)
Platelets: 204 10*3/uL (ref 150–400)
RBC: 3.59 MIL/uL — ABNORMAL LOW (ref 3.87–5.11)
RDW: 15.7 % — ABNORMAL HIGH (ref 11.5–15.5)
WBC: 5.7 10*3/uL (ref 4.0–10.5)
nRBC: 0 % (ref 0.0–0.2)

## 2023-04-03 LAB — TSH: TSH: 3.096 u[IU]/mL (ref 0.350–4.500)

## 2023-04-03 SURGERY — CARDIOVERSION
Anesthesia: General

## 2023-04-03 MED ORDER — MAGNESIUM SULFATE 2 GM/50ML IV SOLN
2.0000 g | Freq: Once | INTRAVENOUS | Status: AC
  Administered 2023-04-03: 2 g via INTRAVENOUS
  Filled 2023-04-03: qty 50

## 2023-04-03 NOTE — Progress Notes (Signed)
Progress Note  Patient Name: Yolanda Bell Date of Encounter: 04/03/2023  Primary Cardiologist: Kristeen Miss, MD    Patient Profile:    Yolanda Bell is a 82 y.o. female with a hx of paroxysmal atrial fibrillation/flutter on Xarelto (s/p TEE DCCV 2023, ablation 2017), SVT (s/p RF ablation) constrictive pericarditis (underwent pericardial stripping with [facility] 2006), hypertension, liver disease, spasmodic dysphonia, renal mass seen 04/02/2023 for the evaluation of atrial flutter with rapid ventricular response. Had symptoms 6/16 resolved and recurred 6/20    Subjective   Recurrent SOB this am with tachycardia ( HR130s)  Feeling better now\  While I was writing the note, secure chat>> dilt stopped Tele reviewed, and still in flutter but now with a SVR and not surprisingly the patient thinks she is in regular rhythm  (See Below)   Inpatient Medications    Scheduled Meds:  metoprolol succinate  25 mg Oral BID   rivaroxaban  20 mg Oral Q supper   Continuous Infusions:  diltiazem (CARDIZEM) infusion Stopped (04/03/23 1139)   magnesium sulfate bolus IVPB     PRN Meds: acetaminophen, bacitracin, melatonin, ondansetron (ZOFRAN) IV, polyethylene glycol   Vital Signs    Vitals:   04/03/23 0733 04/03/23 0821 04/03/23 1000 04/03/23 1140  BP: 111/83 118/67 123/65 (!) 116/53  Pulse: (!) 132 (!) 113 87   Resp: 20 17 18 16   Temp:      TempSrc:      SpO2: 98% 92% 95%   Weight:      Height:        Intake/Output Summary (Last 24 hours) at 04/03/2023 1142 Last data filed at 04/03/2023 1009 Gross per 24 hour  Intake 381.51 ml  Output 2500 ml  Net -2118.49 ml   Filed Weights   04/01/23 2347  Weight: 61 kg    Telemetry    Aflutter with variably ventricular response  - Personally Reviewed  120s>>>50s   ECG      - Personally Reviewed  Physical Exam    GEN: No acute distress.   Neck: supple  Cardiac: Irregularly Irregular rate and rhythm with controlled   ventricular response , no murmurs, rubs, or gallops.  Respiratory: Clear to auscultation bilaterally. GI: Soft, nontender, non-distended  MS: No edema; No deformity. Neuro:  Nonfocal  Psych: Normal affect   Labs    Chemistry Recent Labs  Lab 04/01/23 2352 04/03/23 0017  NA 135 133*  K 4.2 3.8  CL 98 103  CO2 24 23  GLUCOSE 104* 91  BUN 11 14  CREATININE 1.19* 0.70  CALCIUM 9.4 8.2*  PROT  --  5.6*  ALBUMIN  --  3.4*  AST  --  14*  ALT  --  10  ALKPHOS  --  37*  BILITOT  --  1.0  GFRNONAA 46* >60  ANIONGAP 13 7     Hematology Recent Labs  Lab 04/01/23 2352 04/03/23 0017  WBC 9.3 5.7  RBC 4.24 3.59*  HGB 13.3 11.2*  HCT 39.4 32.4*  MCV 92.9 90.3  MCH 31.4 31.2  MCHC 33.8 34.6  RDW 15.6* 15.7*  PLT 301 204    Cardiac EnzymesNo results for input(s): "TROPONINI" in the last 168 hours. No results for input(s): "TROPIPOC" in the last 168 hours.   BNPNo results for input(s): "BNP", "PROBNP" in the last 168 hours.   DDimer No results for input(s): "DDIMER" in the last 168 hours.   Radiology    DG Chest 1 View  Result Date: 04/02/2023 CLINICAL DATA:  Chest pain EXAM: CHEST  1 VIEW COMPARISON:  Radiograph 06/20/2021 FINDINGS: Stable cardiomegaly. Aortic atherosclerotic calcification. Bibasilar atelectasis/scarring. Chronic bronchitic changes. No focal pneumonia, pleural effusion, or pneumothorax. Chronic posttraumatic deformities both humeral heads. No acute displaced rib fracture. IMPRESSION: No active disease. Electronically Signed   By: Minerva Fester M.D.   On: 04/02/2023 00:25    Cardiac Studies   Echo pending  Assessment & Plan    Atrial fib flutter recurrent   Prior ablation  Hypertension  Pericardial stripping 2006 -- constrictive pericarditis   That she is now unaware of the tachycardia with SVR is not surprising, and perhaps explains the transition in her symptoms, which we had interpreted as possible interval spontaneous reversion.  If that  did not occur, it gives [place] more reason to be optimistic about the benefit of another cardioversion ( last one 2023)   BP well controlled, will need to follow off the dilt     For questions or updates, please contact [facility] Please consult [email or web address] for contact info under Cardiology/STEMI.      Signed, Sherryl Manges, MD  04/03/2023, 11:42 AM

## 2023-04-03 NOTE — Progress Notes (Signed)
Echocardiogram 2D Echocardiogram has been performed.  Warren Lacy Lawrencia Mauney RDCS 04/03/2023, 10:52 AM

## 2023-04-03 NOTE — Progress Notes (Signed)
PROGRESS NOTE    Yolanda Bell  ZOX:[id number] DOB: 06/21/1941 DOA: 04/01/2023 PCP: Moshe Cipro, NP   Yolanda Bell is a 82 y.o. female with medical history significant of Paroxysmal Atrial Fibrillation on Xarelto, constrictive pericarditis with perciardial stripping at [facility], HTN, liver disease, and spasmodic dystonia. She has had multiple cardioversions and ablations in the past.  -Presented to [facility] ED for tachycardia and palpitations despite taking 3 doses of propranolol at home -Admitted, started on Cardizem gtt., cardiology consulting  Subjective: -Feels fair overall, heart rate bumped up transiently this morning again  Assessment and Plan:  Atrial fibrillation/flutter with RVR -Continue Cardizem gtt., continue Toprol -Continue Xarelto -Cards consulting, plan for cardioversion tomorrow  Hypertension -Stable continue metoprolol and Cardizem gtt., Aldactone on hold  History of constrictive pericarditis -Status post pericardial stripping at [facility] 2006, stable  Left renal lesion -Stable, needs repeat surveillance in  Insect bite -Left inner thigh, site appears unremarkable at this time  DVT prophylaxis: Xarelto Code Status: Full code Family Communication: None present Disposition Plan: Home likely tomorrow  Consultants:    Procedures:   Antimicrobials:    Objective: Vitals:   04/03/23 0700 04/03/23 0733 04/03/23 0821 04/03/23 1000  BP: 110/65 111/83 118/67 123/65  Pulse: (!) 122 (!) 132 (!) 113 87  Resp: 20 20 17 18   Temp: 97.6 F (36.4 C)     TempSrc: Oral     SpO2: 96% 98% 92% 95%  Weight:      Height:        Intake/Output Summary (Last 24 hours) at 04/03/2023 1023 Last data filed at 04/03/2023 1009 Gross per 24 hour  Intake 381.51 ml  Output 2700 ml  Net -2318.49 ml   Filed Weights   04/01/23 2347  Weight: 61 kg    Examination:  General exam: Appears calm and comfortable  Respiratory system: Clear to  auscultation Cardiovascular system: S1 & S2 heard, irregular rhythm Abd: nondistended, soft and nontender.Normal bowel sounds heard. Central nervous system: Alert and oriented. No focal neurological deficits. Extremities: no edema Skin: No rashes Psychiatry:  Mood & affect appropriate.     Data Reviewed:   CBC: Recent Labs  Lab 04/01/23 2352 04/03/23 0017  WBC 9.3 5.7  HGB 13.3 11.2*  HCT 39.4 32.4*  MCV 92.9 90.3  PLT 301 204   Basic Metabolic Panel: Recent Labs  Lab 04/01/23 2352 04/03/23 0017  NA 135 133*  K 4.2 3.8  CL 98 103  CO2 24 23  GLUCOSE 104* 91  BUN 11 14  CREATININE 1.19* 0.70  CALCIUM 9.4 8.2*  MG 1.9 1.8   GFR: Estimated Creatinine Clearance: 48.3 mL/min (by C-G formula based on SCr of 0.7 mg/dL). Liver Function Tests: Recent Labs  Lab 04/03/23 0017  AST 14*  ALT 10  ALKPHOS 37*  BILITOT 1.0  PROT 5.6*  ALBUMIN 3.4*   No results for input(s): "LIPASE", "AMYLASE" in the last 168 hours. No results for input(s): "AMMONIA" in the last 168 hours. Coagulation Profile: Recent Labs  Lab 04/01/23 2352  INR 2.9*   Cardiac Enzymes: No results for input(s): "CKTOTAL", "CKMB", "CKMBINDEX", "TROPONINI" in the last 168 hours. BNP (last 3 results) No results for input(s): "PROBNP" in the last 8760 hours. HbA1C: No results for input(s): "HGBA1C" in the last 72 hours. CBG: No results for input(s): "GLUCAP" in the last 168 hours. Lipid Profile: No results for input(s): "CHOL", "HDL", "LDLCALC", "TRIG", "CHOLHDL", "LDLDIRECT" in the last 72 hours. Thyroid Function Tests:  Recent Labs    04/03/23 0017  TSH 3.096   Anemia Panel: No results for input(s): "VITAMINB12", "FOLATE", "FERRITIN", "TIBC", "IRON", "RETICCTPCT" in the last 72 hours. Urine analysis:    Component Value Date/Time   COLORURINE YELLOW 06/01/2022 0243   APPEARANCEUR CLEAR 06/01/2022 0243   LABSPEC 1.044 (H) 06/01/2022 0243   PHURINE 5.0 06/01/2022 0243   GLUCOSEU NEGATIVE  06/01/2022 0243   HGBUR NEGATIVE 06/01/2022 0243   BILIRUBINUR NEGATIVE 06/01/2022 0243   KETONESUR 5 (A) 06/01/2022 0243   PROTEINUR NEGATIVE 06/01/2022 0243   NITRITE NEGATIVE 06/01/2022 0243   LEUKOCYTESUR NEGATIVE 06/01/2022 0243   Sepsis Labs: @LABRCNTIP (procalcitonin:4,lacticidven:4)  )No results found for this or any previous visit (from the past 240 hour(s)).   Radiology Studies: DG Chest 1 View  Result Date: 04/02/2023 CLINICAL DATA:  Chest pain EXAM: CHEST  1 VIEW COMPARISON:  Radiograph 06/20/2021 FINDINGS: Stable cardiomegaly. Aortic atherosclerotic calcification. Bibasilar atelectasis/scarring. Chronic bronchitic changes. No focal pneumonia, pleural effusion, or pneumothorax. Chronic posttraumatic deformities both humeral heads. No acute displaced rib fracture. IMPRESSION: No active disease. Electronically Signed   By: Minerva Fester M.D.   On: 04/02/2023 00:25     Scheduled Meds:  metoprolol succinate  25 mg Oral BID   rivaroxaban  20 mg Oral Q supper   Continuous Infusions:  diltiazem (CARDIZEM) infusion 12.5 mg/hr (04/03/23 0823)   magnesium sulfate bolus IVPB       LOS: 1 day    Time spent:    Zannie Cove, MD [facility]   04/03/2023, 10:23 AM

## 2023-04-03 NOTE — Plan of Care (Signed)
  Problem: Education: Goal: Knowledge of General Education information will improve Description: Including pain rating scale, medication(s)/side effects and non-pharmacologic comfort measures Outcome: Progressing   Problem: Clinical Measurements: Goal: Ability to maintain clinical measurements within normal limits will improve Outcome: Progressing Goal: Will remain free from infection Outcome: Progressing Goal: Respiratory complications will improve Outcome: Progressing   Problem: Activity: Goal: Risk for activity intolerance will decrease Outcome: Progressing   

## 2023-04-04 DIAGNOSIS — I311 Chronic constrictive pericarditis: Secondary | ICD-10-CM

## 2023-04-04 DIAGNOSIS — I484 Atypical atrial flutter: Secondary | ICD-10-CM

## 2023-04-04 DIAGNOSIS — I519 Heart disease, unspecified: Secondary | ICD-10-CM

## 2023-04-04 DIAGNOSIS — I4891 Unspecified atrial fibrillation: Secondary | ICD-10-CM | POA: Diagnosis not present

## 2023-04-04 LAB — CBC
HCT: 34 % — ABNORMAL LOW (ref 36.0–46.0)
Hemoglobin: 11.3 g/dL — ABNORMAL LOW (ref 12.0–15.0)
MCH: 30.4 pg (ref 26.0–34.0)
MCHC: 33.2 g/dL (ref 30.0–36.0)
MCV: 91.4 fL (ref 80.0–100.0)
Platelets: 187 10*3/uL (ref 150–400)
RBC: 3.72 MIL/uL — ABNORMAL LOW (ref 3.87–5.11)
RDW: 15.6 % — ABNORMAL HIGH (ref 11.5–15.5)
WBC: 7.1 10*3/uL (ref 4.0–10.5)
nRBC: 0 % (ref 0.0–0.2)

## 2023-04-04 LAB — COMPREHENSIVE METABOLIC PANEL
ALT: 12 U/L (ref 0–44)
AST: 16 U/L (ref 15–41)
Albumin: 3.5 g/dL (ref 3.5–5.0)
Alkaline Phosphatase: 41 U/L (ref 38–126)
Anion gap: 7 (ref 5–15)
BUN: 14 mg/dL (ref 8–23)
CO2: 24 mmol/L (ref 22–32)
Calcium: 8.2 mg/dL — ABNORMAL LOW (ref 8.9–10.3)
Chloride: 103 mmol/L (ref 98–111)
Creatinine, Ser: 0.87 mg/dL (ref 0.44–1.00)
GFR, Estimated: >60 mL/min (ref >60)
Glucose, Bld: 100 mg/dL — ABNORMAL HIGH (ref 70–99)
Potassium: 4 mmol/L (ref 3.5–5.1)
Sodium: 134 mmol/L — ABNORMAL LOW (ref 135–145)
Total Bilirubin: 0.9 mg/dL (ref 0.3–1.2)
Total Protein: 5.8 g/dL — ABNORMAL LOW (ref 6.5–8.1)

## 2023-04-04 LAB — MAGNESIUM: Magnesium: 2.1 mg/dL (ref 1.7–2.4)

## 2023-04-04 MED ORDER — SODIUM CHLORIDE 0.9 % IV SOLN: INTRAVENOUS | Status: DC

## 2023-04-04 MED ORDER — DILTIAZEM HCL-DEXTROSE 125-5 MG/125ML-% IV SOLN (PREMIX): 5.0000 mg/h | INTRAVENOUS | Status: DC

## 2023-04-04 MED ORDER — DILTIAZEM LOAD VIA INFUSION
10.0000 mg | Freq: Once | INTRAVENOUS | Status: AC
  Administered 2023-04-04: 5 mg via INTRAVENOUS
  Filled 2023-04-04: qty 10

## 2023-04-04 NOTE — H&P (View-Only) (Signed)
 Rounding Note    Patient Name: Yolanda Bell Date of Encounter: 04/04/2023  [facility] Cardiologist: Philip Nahser, MD   Subjective   Back in atrial flutter w 2:1 AV block and VR 132. Frustrated that she did not make it on to today's schedule for cardioversion.  Inpatient Medications    Scheduled Meds:  metoprolol succinate  25 mg Oral BID   rivaroxaban  20 mg Oral Q supper   Continuous Infusions:  sodium chloride     diltiazem (CARDIZEM) infusion Stopped (04/03/23 2215)   PRN Meds: acetaminophen, bacitracin, melatonin, ondansetron (ZOFRAN) IV, polyethylene glycol   Vital Signs    Vitals:   04/03/23 2330 04/04/23 0300 04/04/23 0724 04/04/23 0726  BP:   106/70 106/70  Pulse:   (!) 113 (!) 112  Resp: 20  18 13  Temp: 97.8 F (36.6 C) 97.8 F (36.6 C) 98.3 F (36.8 C)   TempSrc: Oral Oral Oral   SpO2:   97% 97%  Weight:      Height:        Intake/Output Summary (Last 24 hours) at 04/04/2023 0911 Last data filed at 04/04/2023 0322 Gross per 24 hour  Intake 120 ml  Output 3800 ml  Net -3680 ml      04/01/2023   11:47 PM 12/22/2022   11:19 AM 10/20/2022   11:03 AM  Last 3 Weights  Weight (lbs) 134 lb 7.7 oz 135 lb 6.4 oz 136 lb 3.2 oz  Weight (kg) 61 kg 61.417 kg 61.78 kg      Telemetry    AFlutter w RVR - Personally Reviewed  ECG    No new tracing - Personally Reviewed  Physical Exam  Comfortable lying almost fully flat GEN: No acute distress.   Neck: No JVD Cardiac: tachycardic, RRR, no murmurs, rubs, or gallops.  Respiratory: Clear to auscultation bilaterally. GI: Soft, nontender, non-distended  MS: No edema; No deformity. Neuro:  Nonfocal  Psych: Normal affect   Labs    High Sensitivity Troponin:   Recent Labs  Lab 04/01/23 2352 04/02/23 0254  TROPONINIHS 18* 33*     Chemistry Recent Labs  Lab 04/01/23 2352 04/03/23 0017 04/04/23 0015  NA 135 133* 134*  K 4.2 3.8 4.0  CL 98 103 103  CO2 24 23 24  GLUCOSE 104*  91 100*  BUN 11 14 14  CREATININE 1.19* 0.70 0.87  CALCIUM 9.4 8.2* 8.2*  MG 1.9 1.8 2.1  PROT  --  5.6* 5.8*  ALBUMIN  --  3.4* 3.5  AST  --  14* 16  ALT  --  10 12  ALKPHOS  --  37* 41  BILITOT  --  1.0 0.9  GFRNONAA 46* >60 >60  ANIONGAP 13 7 7    Lipids No results for input(s): "CHOL", "TRIG", "HDL", "LABVLDL", "LDLCALC", "CHOLHDL" in the last 168 hours.  Hematology Recent Labs  Lab 04/01/23 2352 04/03/23 0017 04/04/23 0015  WBC 9.3 5.7 7.1  RBC 4.24 3.59* 3.72*  HGB 13.3 11.2* 11.3*  HCT 39.4 32.4* 34.0*  MCV 92.9 90.3 91.4  MCH 31.4 31.2 30.4  MCHC 33.8 34.6 33.2  RDW 15.6* 15.7* 15.6*  PLT 301 204 187   Thyroid  Recent Labs  Lab 04/03/23 0017  TSH 3.096    BNPNo results for input(s): "BNP", "PROBNP" in the last 168 hours.  DDimer No results for input(s): "DDIMER" in the last 168 hours.   Radiology    ECHOCARDIOGRAM COMPLETE  Result   Date: 04/03/2023    ECHOCARDIOGRAM REPORT   Patient Name:   Yolanda Bell Date of Exam: 04/03/2023 Medical Rec #:  [id number]      Height:       62.0 in Accession #:    [id number]     Weight:       134.5 lb Date of Birth:  09/04/1941       BSA:          1.615 m Patient Age:    82 years       BP:           107/61 mmHg Patient Gender: F              HR:           87 bpm. Exam Location:  Inpatient Procedure: 2D Echo, Color Doppler and Cardiac Doppler Indications:    I48.92* Unspecified atrial flutter  History:        Patient has prior history of Echocardiogram examinations, most                 recent 06/23/2021. CHF, Arrythmias:Atrial Fibrillation; Risk                 Factors:Hypertension.  Sonographer:    Emily Senior RDCS Referring Phys: [id number] KATY L FOUST IMPRESSIONS  1. Left ventricular ejection fraction, by estimation, is 40 to 45%. The left ventricle has mildly decreased function. The left ventricle demonstrates global hypokinesis. Left ventricular diastolic parameters are indeterminate.  2. Right ventricular systolic function is  mildly reduced. The right ventricular size is normal. There is normal pulmonary artery systolic pressure. The estimated right ventricular systolic pressure is 22.7 mmHg.  3. Left atrial size was mildly dilated.  4. The mitral valve is abnormal. Mild mitral valve regurgitation. No evidence of mitral stenosis.  5. The aortic valve is tricuspid. There is mild calcification of the aortic valve. There is mild thickening of the aortic valve. Aortic valve regurgitation is not visualized. Aortic valve sclerosis is present, with no evidence of aortic valve stenosis.  6. The inferior vena cava is dilated in size with >50% respiratory variability, suggesting right atrial pressure of 8 mmHg. Comparison(s): Changes from prior study are noted. EF decreased slightly compared to prior. FINDINGS  Left Ventricle: Left ventricular ejection fraction, by estimation, is 40 to 45%. The left ventricle has mildly decreased function. The left ventricle demonstrates global hypokinesis. The left ventricular internal cavity size was normal in size. There is  no left ventricular hypertrophy. Left ventricular diastolic parameters are indeterminate. Right Ventricle: The right ventricular size is normal. No increase in right ventricular wall thickness. Right ventricular systolic function is mildly reduced. There is normal pulmonary artery systolic pressure. The tricuspid regurgitant velocity is 1.92 m/s, and with an assumed right atrial pressure of 8 mmHg, the estimated right ventricular systolic pressure is 22.7 mmHg. Left Atrium: Left atrial size was mildly dilated. Right Atrium: Right atrial size was normal in size. Pericardium: Trivial pericardial effusion is present. Mitral Valve: The mitral valve is abnormal. Mild mitral valve regurgitation. No evidence of mitral valve stenosis. Tricuspid Valve: The tricuspid valve is normal in structure. Tricuspid valve regurgitation is mild. Aortic Valve: The aortic valve is tricuspid. There is mild  calcification of the aortic valve. There is mild thickening of the aortic valve. Aortic valve regurgitation is not visualized. Aortic valve sclerosis is present, with no evidence of aortic valve stenosis. Pulmonic Valve: The pulmonic valve was not well visualized. Pulmonic   valve regurgitation is trivial. Aorta: The aortic root, ascending aorta, aortic arch and descending aorta are all structurally normal, with no evidence of dilitation or obstruction. Venous: The inferior vena cava is dilated in size with greater than 50% respiratory variability, suggesting right atrial pressure of 8 mmHg. IAS/Shunts: The atrial septum is grossly normal.  LEFT VENTRICLE PLAX 2D LVIDd:         5.10 cm     Diastology LVIDs:         4.10 cm     LV e' medial:    6.74 cm/s LV PW:         0.80 cm     LV E/e' medial:  10.8 LV IVS:        0.80 cm     LV e' lateral:   10.90 cm/s LVOT diam:     1.80 cm     LV E/e' lateral: 6.7 LV SV:         39 LV SV Index:   24 LVOT Area:     2.54 cm  LV Volumes (MOD) LV vol d, MOD A2C: 74.1 ml LV vol d, MOD A4C: 92.0 ml LV vol s, MOD A2C: 44.3 ml LV vol s, MOD A4C: 51.8 ml LV SV MOD A2C:     29.8 ml LV SV MOD A4C:     92.0 ml LV SV MOD BP:      37.5 ml RIGHT VENTRICLE RV S prime:     6.64 cm/s TAPSE (M-mode): 1.2 cm LEFT ATRIUM             Index        RIGHT ATRIUM           Index LA diam:        4.40 cm 2.72 cm/m   RA Area:     11.00 cm LA Vol (A2C):   52.2 ml 32.32 ml/m  RA Volume:   22.10 ml  13.68 ml/m LA Vol (A4C):   59.7 ml 36.97 ml/m LA Biplane Vol: 59.3 ml 36.72 ml/m  AORTIC VALVE LVOT Vmax:   81.70 cm/s LVOT Vmean:  58.400 cm/s LVOT VTI:    0.154 m  AORTA Ao Root diam: 3.00 cm Ao Asc diam:  3.20 cm MITRAL VALVE               TRICUSPID VALVE MV Area (PHT): 3.83 cm    TR Peak grad:   14.7 mmHg MV Decel Time: 198 msec    TR Vmax:        192.00 cm/s MV E velocity: 73.10 cm/s                            SHUNTS                            Systemic VTI:  0.15 m                            Systemic  Diam: 1.80 cm Bridgette Christopher MD Electronically signed by Bridgette Christopher MD Signature Date/Time: 04/03/2023/12:04:37 PM    Final     Cardiac Studies  ECHO 04/03/2023  1. Left ventricular ejection fraction, by estimation, is 40 to 45%. The left ventricle has mildly decreased function. The left ventricle demonstrates global hypokinesis. Left ventricular diastolic parameters are indeterminate.   2. Right ventricular systolic   function is mildly reduced. The right ventricular size is normal. There is normal pulmonary artery systolic pressure. The estimated right ventricular systolic pressure is 22.7 mmHg.   3. Left atrial size was mildly dilated.   4. The mitral valve is abnormal. Mild mitral valve regurgitation. No evidence of mitral stenosis.   5. The aortic valve is tricuspid. There is mild calcification of the aortic valve. There is mild thickening of the aortic valve. Aortic valve regurgitation is not visualized. Aortic valve sclerosis is present, with no evidence of aortic valve stenosis.   6. The inferior vena cava is dilated in size with >50% respiratory variability, suggesting right atrial pressure of 8 mmHg.  Comparison(s): Changes from prior study are noted. EF decreased slightly compared to prior.  Patient Profile     82 y.o. female w history of SVT (ablation 2007), persistent atrial fibrillation and atrial flutter (ablation 2017, most recent DCCV 04/09/2022), pericardial stripping for constriction (2006), hypertension, liver disease, spasmodic dysphonia, renal mass here with symptomatic atrial flutter w RVR.  Assessment & Plan    After diltiazem was stopped for low HR, back in RVR. Unable to perform CV today. Schedule is full. Scheduled for tomorrow 0830 w Dr. Christopher. No clinical evidence of HF decompensation. Restart IV diltiazem until tomorrow. She was not symptomatic with VR in 40s, but feels poorly w current RVR.     For questions or updates, please contact [facility] Please consult [email or web address] for contact info under        Signed, Dorian Renfro, MD  04/04/2023, 9:11 AM    

## 2023-04-04 NOTE — Progress Notes (Signed)
Rounding Note    Patient Name: Yolanda Bell Date of Encounter: 04/04/2023  [facility] Cardiologist: Kristeen Miss, MD   Subjective   Back in atrial flutter w 2:1 AV block and VR 132. Frustrated that she did not make it on to today's schedule for cardioversion.  Inpatient Medications    Scheduled Meds:  metoprolol succinate  25 mg Oral BID   rivaroxaban  20 mg Oral Q supper   Continuous Infusions:  sodium chloride     diltiazem (CARDIZEM) infusion Stopped (04/03/23 2215)   PRN Meds: acetaminophen, bacitracin, melatonin, ondansetron (ZOFRAN) IV, polyethylene glycol   Vital Signs    Vitals:   04/03/23 2330 04/04/23 0300 04/04/23 0724 04/04/23 0726  BP:   106/70 106/70  Pulse:   (!) 113 (!) 112  Resp: 20  18 13   Temp: 97.8 F (36.6 C) 97.8 F (36.6 C) 98.3 F (36.8 C)   TempSrc: Oral Oral Oral   SpO2:   97% 97%  Weight:      Height:        Intake/Output Summary (Last 24 hours) at 04/04/2023 0911 Last data filed at 04/04/2023 0322 Gross per 24 hour  Intake 120 ml  Output 3800 ml  Net -3680 ml      04/01/2023   11:47 PM 12/22/2022   11:19 AM 10/20/2022   11:03 AM  Last 3 Weights  Weight (lbs) 134 lb 7.7 oz 135 lb 6.4 oz 136 lb 3.2 oz  Weight (kg) 61 kg 61.417 kg 61.78 kg      Telemetry    AFlutter w RVR - Personally Reviewed  ECG    No new tracing - Personally Reviewed  Physical Exam  Comfortable lying almost fully flat GEN: No acute distress.   Neck: No JVD Cardiac: tachycardic, RRR, no murmurs, rubs, or gallops.  Respiratory: Clear to auscultation bilaterally. GI: Soft, nontender, non-distended  MS: No edema; No deformity. Neuro:  Nonfocal  Psych: Normal affect   Labs    High Sensitivity Troponin:   Recent Labs  Lab 04/01/23 2352 04/02/23 0254  TROPONINIHS 18* 33*     Chemistry Recent Labs  Lab 04/01/23 2352 04/03/23 0017 04/04/23 0015  NA 135 133* 134*  K 4.2 3.8 4.0  CL 98 103 103  CO2 24 23 24   GLUCOSE 104*  91 100*  BUN 11 14 14   CREATININE 1.19* 0.70 0.87  CALCIUM 9.4 8.2* 8.2*  MG 1.9 1.8 2.1  PROT  --  5.6* 5.8*  ALBUMIN  --  3.4* 3.5  AST  --  14* 16  ALT  --  10 12  ALKPHOS  --  37* 41  BILITOT  --  1.0 0.9  GFRNONAA 46* >60 >60  ANIONGAP 13 7 7     Lipids No results for input(s): "CHOL", "TRIG", "HDL", "LABVLDL", "LDLCALC", "CHOLHDL" in the last 168 hours.  Hematology Recent Labs  Lab 04/01/23 2352 04/03/23 0017 04/04/23 0015  WBC 9.3 5.7 7.1  RBC 4.24 3.59* 3.72*  HGB 13.3 11.2* 11.3*  HCT 39.4 32.4* 34.0*  MCV 92.9 90.3 91.4  MCH 31.4 31.2 30.4  MCHC 33.8 34.6 33.2  RDW 15.6* 15.7* 15.6*  PLT 301 204 187   Thyroid  Recent Labs  Lab 04/03/23 0017  TSH 3.096    BNPNo results for input(s): "BNP", "PROBNP" in the last 168 hours.  DDimer No results for input(s): "DDIMER" in the last 168 hours.   Radiology    ECHOCARDIOGRAM COMPLETE  Result  Date: 04/03/2023    ECHOCARDIOGRAM REPORT   Patient Name:   Yolanda Bell Date of Exam: 04/03/2023 Medical Rec #:  [id number]      Height:       62.0 in Accession #:    [id number]     Weight:       134.5 lb Date of Birth:  [birth year]       BSA:          1.615 m Patient Age:    82 years       BP:           107/61 mmHg Patient Gender: F              HR:           87 bpm. Exam Location:  Inpatient Procedure: 2D Echo, Color Doppler and Cardiac Doppler Indications:    I48.92* Unspecified atrial flutter  History:        Patient has prior history of Echocardiogram examinations, most                 recent 06/23/2021. CHF, Arrythmias:Atrial Fibrillation; Risk                 Factors:Hypertension.  Sonographer:    Irving Burton Senior RDCS Referring Phys: [id number] KATY L FOUST IMPRESSIONS  1. Left ventricular ejection fraction, by estimation, is 40 to 45%. The left ventricle has mildly decreased function. The left ventricle demonstrates global hypokinesis. Left ventricular diastolic parameters are indeterminate.  2. Right ventricular systolic function is  mildly reduced. The right ventricular size is normal. There is normal pulmonary artery systolic pressure. The estimated right ventricular systolic pressure is 22.7 mmHg.  3. Left atrial size was mildly dilated.  4. The mitral valve is abnormal. Mild mitral valve regurgitation. No evidence of mitral stenosis.  5. The aortic valve is tricuspid. There is mild calcification of the aortic valve. There is mild thickening of the aortic valve. Aortic valve regurgitation is not visualized. Aortic valve sclerosis is present, with no evidence of aortic valve stenosis.  6. The inferior vena cava is dilated in size with >50% respiratory variability, suggesting right atrial pressure of 8 mmHg. Comparison(s): Changes from prior study are noted. EF decreased slightly compared to prior. FINDINGS  Left Ventricle: Left ventricular ejection fraction, by estimation, is 40 to 45%. The left ventricle has mildly decreased function. The left ventricle demonstrates global hypokinesis. The left ventricular internal cavity size was normal in size. There is  no left ventricular hypertrophy. Left ventricular diastolic parameters are indeterminate. Right Ventricle: The right ventricular size is normal. No increase in right ventricular wall thickness. Right ventricular systolic function is mildly reduced. There is normal pulmonary artery systolic pressure. The tricuspid regurgitant velocity is 1.92 m/s, and with an assumed right atrial pressure of 8 mmHg, the estimated right ventricular systolic pressure is 22.7 mmHg. Left Atrium: Left atrial size was mildly dilated. Right Atrium: Right atrial size was normal in size. Pericardium: Trivial pericardial effusion is present. Mitral Valve: The mitral valve is abnormal. Mild mitral valve regurgitation. No evidence of mitral valve stenosis. Tricuspid Valve: The tricuspid valve is normal in structure. Tricuspid valve regurgitation is mild. Aortic Valve: The aortic valve is tricuspid. There is mild  calcification of the aortic valve. There is mild thickening of the aortic valve. Aortic valve regurgitation is not visualized. Aortic valve sclerosis is present, with no evidence of aortic valve stenosis. Pulmonic Valve: The pulmonic valve was not well visualized. Pulmonic  valve regurgitation is trivial. Aorta: The aortic root, ascending aorta, aortic arch and descending aorta are all structurally normal, with no evidence of dilitation or obstruction. Venous: The inferior vena cava is dilated in size with greater than 50% respiratory variability, suggesting right atrial pressure of 8 mmHg. IAS/Shunts: The atrial septum is grossly normal.  LEFT VENTRICLE PLAX 2D LVIDd:         5.10 cm     Diastology LVIDs:         4.10 cm     LV e' medial:    6.74 cm/s LV PW:         0.80 cm     LV E/e' medial:  10.8 LV IVS:        0.80 cm     LV e' lateral:   10.90 cm/s LVOT diam:     1.80 cm     LV E/e' lateral: 6.7 LV SV:         39 LV SV Index:   24 LVOT Area:     2.54 cm  LV Volumes (MOD) LV vol d, MOD A2C: 74.1 ml LV vol d, MOD A4C: 92.0 ml LV vol s, MOD A2C: 44.3 ml LV vol s, MOD A4C: 51.8 ml LV SV MOD A2C:     29.8 ml LV SV MOD A4C:     92.0 ml LV SV MOD BP:      37.5 ml RIGHT VENTRICLE RV S prime:     6.64 cm/s TAPSE (M-mode): 1.2 cm LEFT ATRIUM             Index        RIGHT ATRIUM           Index LA diam:        4.40 cm 2.72 cm/m   RA Area:     11.00 cm LA Vol (A2C):   52.2 ml 32.32 ml/m  RA Volume:   22.10 ml  13.68 ml/m LA Vol (A4C):   59.7 ml 36.97 ml/m LA Biplane Vol: 59.3 ml 36.72 ml/m  AORTIC VALVE LVOT Vmax:   81.70 cm/s LVOT Vmean:  58.400 cm/s LVOT VTI:    0.154 m  AORTA Ao Root diam: 3.00 cm Ao Asc diam:  3.20 cm MITRAL VALVE               TRICUSPID VALVE MV Area (PHT): 3.83 cm    TR Peak grad:   14.7 mmHg MV Decel Time: 198 msec    TR Vmax:        192.00 cm/s MV E velocity: 73.10 cm/s                            SHUNTS                            Systemic VTI:  0.15 m                            Systemic  Diam: 1.80 cm Jodelle Red MD Electronically signed by Jodelle Red MD Signature Date/Time: 04/03/2023/12:04:37 PM    Final     Cardiac Studies  ECHO 04/03/2023  1. Left ventricular ejection fraction, by estimation, is 40 to 45%. The left ventricle has mildly decreased function. The left ventricle demonstrates global hypokinesis. Left ventricular diastolic parameters are indeterminate.   2. Right ventricular systolic  function is mildly reduced. The right ventricular size is normal. There is normal pulmonary artery systolic pressure. The estimated right ventricular systolic pressure is 22.7 mmHg.   3. Left atrial size was mildly dilated.   4. The mitral valve is abnormal. Mild mitral valve regurgitation. No evidence of mitral stenosis.   5. The aortic valve is tricuspid. There is mild calcification of the aortic valve. There is mild thickening of the aortic valve. Aortic valve regurgitation is not visualized. Aortic valve sclerosis is present, with no evidence of aortic valve stenosis.   6. The inferior vena cava is dilated in size with >50% respiratory variability, suggesting right atrial pressure of 8 mmHg.  Comparison(s): Changes from prior study are noted. EF decreased slightly compared to prior.  Patient Profile     82 y.o. female w history of SVT (ablation 2007), persistent atrial fibrillation and atrial flutter (ablation 2017, most recent DCCV 04/09/2022), pericardial stripping for constriction (2006), hypertension, liver disease, spasmodic dysphonia, renal mass here with symptomatic atrial flutter w RVR.  Assessment & Plan    After diltiazem was stopped for low HR, back in RVR. Unable to perform CV today. Schedule is full. Scheduled for tomorrow 0830 w Dr. Cristal Deer. No clinical evidence of HF decompensation. Restart IV diltiazem until tomorrow. She was not symptomatic with VR in 40s, but feels poorly w current RVR.     For questions or updates, please contact [facility] Please consult [email or web address] for contact info under        Signed, Thurmon Fair, MD  04/04/2023, 9:11 AM

## 2023-04-04 NOTE — Progress Notes (Signed)
MEWS Progress Note  Patient Details Name: Yolanda Bell MRN: [id number] DOB: Jun 13, 1941 Today's Date: 04/04/2023   MEWS Flowsheet Documentation:  Assess: MEWS Score Temp: 98.3 F (36.8 C) BP: 106/70 MAP (mmHg): 80 Pulse Rate: (!) 112 ECG Heart Rate: (!) 112 Resp: 13 Level of Consciousness: Alert SpO2: 97 % O2 Device: Room Air Assess: MEWS Score MEWS Temp: 0 MEWS Systolic: 0 MEWS Pulse: 2 MEWS RR: 1 MEWS LOC: 0 MEWS Score: 3 MEWS Score Color: Yellow Assess: SIRS CRITERIA SIRS Temperature : 0 SIRS Respirations : 0 SIRS Pulse: 1 SIRS WBC: 0 SIRS Score Sum : 1 Assess: if the MEWS score is Yellow or Red Were vital signs taken at a resting state?: Yes Focused Assessment: No change from prior assessment Does the patient meet 2 or more of the SIRS criteria?: No MEWS guidelines implemented : Yes, yellow Treat MEWS Interventions: Considered administering scheduled or prn medications/treatments as ordered Take Vital Signs Increase Vital Sign Frequency : Yellow: Q2hr x1, continue Q4hrs until patient remains green for 12hrs Escalate MEWS: Escalate: Yellow: Discuss with charge nurse and consider notifying provider and/or RRT Notify: Charge Nurse/RN Name of Charge Nurse/RN Notified: creshenda Provider Notification Provider Name/Title: dr. Royann Shivers MD Date Provider Notified: 04/04/23 Time Provider Notified: [id number] Method of Notification: Face-to-face Notification Reason:  (heart rate 0n 120's) Provider response: At bedside, See new orders Date of Provider Response: 04/04/23 Time of Provider Response: [id number]      Arvella Merles I 04/04/2023, 8:59 AM

## 2023-04-04 NOTE — TOC Initial Note (Signed)
Transition of Care [facility]) - Initial/Assessment Note    Patient Details  Name: Yolanda Bell MRN: [id number] Date of Birth: 24-Feb-1941  Transition of [facility]) CM/SW Contact:    Harriet Masson, RN Phone Number: 04/04/2023, 2:46 PM  Clinical Narrative:                 Spoke to patient regarding transition needs. Patient lives alone and uses cane. Patient in OP-rehab in prep for knee replacement surgery in 07/2023. Patient's son can transport home. Patient drives herself to apts.  PCP confirmed.  Cardioversion scheduled for tomorrow am.  TOC following.  Expected Discharge Plan: Home/Self Care Barriers to Discharge: Continued Medical Work up   Patient Goals and CMS Choice Patient states their goals for this hospitalization and ongoing recovery are:: return home          Expected Discharge Plan and Services       Living arrangements for the past 2 months: Apartment                                      Prior Living Arrangements/Services Living arrangements for the past 2 months: Apartment Lives with:: Self Patient language and need for interpreter reviewed:: Yes Do you feel safe going back to the place where you live?: Yes      Need for Family Participation in Patient Care: Yes (Comment) Care giver support system in place?: Yes (comment)   Criminal Activity/Legal Involvement Pertinent to Current Situation/Hospitalization: No - Comment as needed  Activities of Daily Living Home Assistive Devices/Equipment: None ADL Screening (condition at time of admission) Patient's cognitive ability adequate to safely complete daily activities?: Yes Is the patient deaf or have difficulty hearing?: No Does the patient have difficulty seeing, even when wearing glasses/contacts?: No Does the patient have difficulty concentrating, remembering, or making decisions?: No Patient able to express need for assistance with ADLs?: Yes Does the patient have difficulty dressing or  bathing?: No Independently performs ADLs?: Yes (appropriate for developmental age) Does the patient have difficulty walking or climbing stairs?: No Weakness of Legs: None Weakness of Arms/Hands: None  Permission Sought/Granted                  Emotional Assessment Appearance:: Appears stated age Attitude/Demeanor/Rapport: Gracious, Engaged Affect (typically observed): Accepting Orientation: : Oriented to Self, Oriented to Place, Oriented to  Time, Oriented to Situation Alcohol / Substance Use: Not Applicable Psych Involvement: No (comment)  Admission diagnosis:  A-fib (HCC) [I48.91] Atrial fibrillation with RVR (HCC) [I48.91] Typical atrial flutter (HCC) [I48.3] Patient Active Problem List   Diagnosis Date Noted   A-fib (HCC) 04/02/2023   Chronic diastolic CHF (congestive heart failure) (HCC) 04/02/2023   Hypophosphatemia 06/07/2022   GI bleeding 06/07/2022   Hypokalemia 06/06/2022   History of COVID-19 06/02/2022   Diverticulitis large intestine 06/02/2022   Nodule of kidney 06/02/2022   Compression fracture of lumbar vertebra (HCC) 06/02/2022   History of liver disease 06/02/2022   Diverticulitis 06/02/2022   Diverticulitis of small bowel 06/01/2022   Urinary urgency 06/01/2022   Tachycardia 03/31/2022   Near syncope 06/21/2021   Acute blood loss anemia 06/21/2021   Syncope and collapse 06/20/2021   Fall    Hypotension    Hematoma    PAF (paroxysmal atrial fibrillation) (HCC) 08/20/2016   Atrial fibrillation (HCC) 05/13/2011   Constrictive pericarditis 05/13/2011   Supraventricular tachycardia 05/13/2011  Hypertension 05/13/2011   Gout 05/13/2011   PCP:  Moshe Cipro, NP Pharmacy:   [id number] [place], [place] - [address] [place] [address] Phone: [phone] Fax: [phone]  [facility] [address] [place] [address] Phone: [phone] Fax: [phone]     Social  Determinants of Health (SDOH) Social History: SDOH Screenings   Food Insecurity: No Food Insecurity (04/02/2023)  Housing: Low Risk  (04/02/2023)  Transportation Needs: No Transportation Needs (04/02/2023)  Utilities: Not At Risk (04/02/2023)  Tobacco Use: Low Risk  (04/01/2023)   SDOH Interventions:     Readmission Risk Interventions     No data to display

## 2023-04-04 NOTE — Progress Notes (Signed)
PROGRESS NOTE    Yolanda Bell  ZOX:[id number] DOB: [birth year] DOA: 04/01/2023 PCP: Moshe Cipro, NP   Yolanda Bell is a 82 y.o. female with medical history significant of Paroxysmal Atrial Fibrillation on Xarelto, constrictive pericarditis with perciardial stripping at [facility], HTN, liver disease, and spasmodic dystonia. She has had multiple cardioversions and ablations in the past.  -Presented to [facility] ED for tachycardia and palpitations despite taking 3 doses of propranolol at home -Admitted, started on Cardizem gtt., cardiology consulting  Subjective: -Heart still racing  Assessment and Plan:  Atrial fibrillation/flutter with RVR -Continue Cardizem gtt., continue Toprol -Continue Xarelto -Cards consulting, today's schedule was full, cardioversion changed to tomorrow  Chronic systolic CHF -EF down to 40-45%, mildly reduced RV on echo, previously 50-55% -Likely secondary to A-fib RVR -Clinically euvolemic, at Aldactone tomorrow, blood pressure soft  Hypertension -Stable continue metoprolol and Cardizem gtt., Aldactone on hold  History of constrictive pericarditis -Status post pericardial stripping at [facility] 2006, stable  Left renal lesion -Stable, needs repeat surveillance in  Insect bite -Left inner thigh, site appears unremarkable at this time  DVT prophylaxis: Xarelto Code Status: Full code Family Communication: None present Disposition Plan: Home likely tomorrow  Consultants:    Procedures:   Antimicrobials:    Objective: Vitals:   04/03/23 2330 04/04/23 0300 04/04/23 0724 04/04/23 0726  BP:   106/70 106/70  Pulse:   (!) 113 (!) 112  Resp: 20  18 13   Temp: 97.8 F (36.6 C) 97.8 F (36.6 C) 98.3 F (36.8 C)   TempSrc: Oral Oral Oral   SpO2:   97% 97%  Weight:      Height:        Intake/Output Summary (Last 24 hours) at 04/04/2023 0934 Last data filed at 04/04/2023 8119 Gross per 24 hour  Intake 120 ml  Output 3800 ml  Net -3680  ml   Filed Weights   04/01/23 2347  Weight: 61 kg    Examination:  General exam: Pleasant elderly female sitting up in bed, AAOx3 HEENT: No JVD CVS: S1-S2, irregular rhythm Lungs: Clear bilaterally Abdomen: Soft, nontender, bowel sounds present Extremities: No edema Skin: No rashes Psychiatry:  Mood & affect appropriate.     Data Reviewed:   CBC: Recent Labs  Lab 04/01/23 2352 04/03/23 0017 04/04/23 0015  WBC 9.3 5.7 7.1  HGB 13.3 11.2* 11.3*  HCT 39.4 32.4* 34.0*  MCV 92.9 90.3 91.4  PLT 301 204 187   Basic Metabolic Panel: Recent Labs  Lab 04/01/23 2352 04/03/23 0017 04/04/23 0015  NA 135 133* 134*  K 4.2 3.8 4.0  CL 98 103 103  CO2 24 23 24   GLUCOSE 104* 91 100*  BUN 11 14 14   CREATININE 1.19* 0.70 0.87  CALCIUM 9.4 8.2* 8.2*  MG 1.9 1.8 2.1   GFR: Estimated Creatinine Clearance: 44.4 mL/min (by C-G formula based on SCr of 0.87 mg/dL). Liver Function Tests: Recent Labs  Lab 04/03/23 0017 04/04/23 0015  AST 14* 16  ALT 10 12  ALKPHOS 37* 41  BILITOT 1.0 0.9  PROT 5.6* 5.8*  ALBUMIN 3.4* 3.5   No results for input(s): "LIPASE", "AMYLASE" in the last 168 hours. No results for input(s): "AMMONIA" in the last 168 hours. Coagulation Profile: Recent Labs  Lab 04/01/23 2352  INR 2.9*   Cardiac Enzymes: No results for input(s): "CKTOTAL", "CKMB", "CKMBINDEX", "TROPONINI" in the last 168 hours. BNP (last 3 results) No results for input(s): "PROBNP" in the last 8760  hours. HbA1C: No results for input(s): "HGBA1C" in the last 72 hours. CBG: No results for input(s): "GLUCAP" in the last 168 hours. Lipid Profile: No results for input(s): "CHOL", "HDL", "LDLCALC", "TRIG", "CHOLHDL", "LDLDIRECT" in the last 72 hours. Thyroid Function Tests: Recent Labs    04/03/23 0017  TSH 3.096   Anemia Panel: No results for input(s): "VITAMINB12", "FOLATE", "FERRITIN", "TIBC", "IRON", "RETICCTPCT" in the last 72 hours. Urine analysis:    Component  Value Date/Time   COLORURINE YELLOW 06/01/2022 0243   APPEARANCEUR CLEAR 06/01/2022 0243   LABSPEC 1.044 (H) 06/01/2022 0243   PHURINE 5.0 06/01/2022 0243   GLUCOSEU NEGATIVE 06/01/2022 0243   HGBUR NEGATIVE 06/01/2022 0243   BILIRUBINUR NEGATIVE 06/01/2022 0243   KETONESUR 5 (A) 06/01/2022 0243   PROTEINUR NEGATIVE 06/01/2022 0243   NITRITE NEGATIVE 06/01/2022 0243   LEUKOCYTESUR NEGATIVE 06/01/2022 0243   Sepsis Labs: @LABRCNTIP (procalcitonin:4,lacticidven:4)  )No results found for this or any previous visit (from the past 240 hour(s)).   Radiology Studies: ECHOCARDIOGRAM COMPLETE  Result Date: 04/03/2023    ECHOCARDIOGRAM REPORT   Patient Name:   Yolanda Bell Date of Exam: 04/03/2023 Medical Rec #:  [id number]      Height:       62.0 in Accession #:    [id number]     Weight:       134.5 lb Date of Birth:  05/23/1941       BSA:          1.615 m Patient Age:    82 years       BP:           107/61 mmHg Patient Gender: F              HR:           87 bpm. Exam Location:  Inpatient Procedure: 2D Echo, Color Doppler and Cardiac Doppler Indications:    I48.92* Unspecified atrial flutter  History:        Patient has prior history of Echocardiogram examinations, most                 recent 06/23/2021. CHF, Arrythmias:Atrial Fibrillation; Risk                 Factors:Hypertension.  Sonographer:    Irving Burton Senior RDCS Referring Phys: [id number] KATY L FOUST IMPRESSIONS  1. Left ventricular ejection fraction, by estimation, is 40 to 45%. The left ventricle has mildly decreased function. The left ventricle demonstrates global hypokinesis. Left ventricular diastolic parameters are indeterminate.  2. Right ventricular systolic function is mildly reduced. The right ventricular size is normal. There is normal pulmonary artery systolic pressure. The estimated right ventricular systolic pressure is 22.7 mmHg.  3. Left atrial size was mildly dilated.  4. The mitral valve is abnormal. Mild mitral valve regurgitation. No  evidence of mitral stenosis.  5. The aortic valve is tricuspid. There is mild calcification of the aortic valve. There is mild thickening of the aortic valve. Aortic valve regurgitation is not visualized. Aortic valve sclerosis is present, with no evidence of aortic valve stenosis.  6. The inferior vena cava is dilated in size with >50% respiratory variability, suggesting right atrial pressure of 8 mmHg. Comparison(s): Changes from prior study are noted. EF decreased slightly compared to prior. FINDINGS  Left Ventricle: Left ventricular ejection fraction, by estimation, is 40 to 45%. The left ventricle has mildly decreased function. The left ventricle demonstrates global hypokinesis. The left ventricular internal cavity size was  normal in size. There is  no left ventricular hypertrophy. Left ventricular diastolic parameters are indeterminate. Right Ventricle: The right ventricular size is normal. No increase in right ventricular wall thickness. Right ventricular systolic function is mildly reduced. There is normal pulmonary artery systolic pressure. The tricuspid regurgitant velocity is 1.92 m/s, and with an assumed right atrial pressure of 8 mmHg, the estimated right ventricular systolic pressure is 22.7 mmHg. Left Atrium: Left atrial size was mildly dilated. Right Atrium: Right atrial size was normal in size. Pericardium: Trivial pericardial effusion is present. Mitral Valve: The mitral valve is abnormal. Mild mitral valve regurgitation. No evidence of mitral valve stenosis. Tricuspid Valve: The tricuspid valve is normal in structure. Tricuspid valve regurgitation is mild. Aortic Valve: The aortic valve is tricuspid. There is mild calcification of the aortic valve. There is mild thickening of the aortic valve. Aortic valve regurgitation is not visualized. Aortic valve sclerosis is present, with no evidence of aortic valve stenosis. Pulmonic Valve: The pulmonic valve was not well visualized. Pulmonic valve  regurgitation is trivial. Aorta: The aortic root, ascending aorta, aortic arch and descending aorta are all structurally normal, with no evidence of dilitation or obstruction. Venous: The inferior vena cava is dilated in size with greater than 50% respiratory variability, suggesting right atrial pressure of 8 mmHg. IAS/Shunts: The atrial septum is grossly normal.  LEFT VENTRICLE PLAX 2D LVIDd:         5.10 cm     Diastology LVIDs:         4.10 cm     LV e' medial:    6.74 cm/s LV PW:         0.80 cm     LV E/e' medial:  10.8 LV IVS:        0.80 cm     LV e' lateral:   10.90 cm/s LVOT diam:     1.80 cm     LV E/e' lateral: 6.7 LV SV:         39 LV SV Index:   24 LVOT Area:     2.54 cm  LV Volumes (MOD) LV vol d, MOD A2C: 74.1 ml LV vol d, MOD A4C: 92.0 ml LV vol s, MOD A2C: 44.3 ml LV vol s, MOD A4C: 51.8 ml LV SV MOD A2C:     29.8 ml LV SV MOD A4C:     92.0 ml LV SV MOD BP:      37.5 ml RIGHT VENTRICLE RV S prime:     6.64 cm/s TAPSE (M-mode): 1.2 cm LEFT ATRIUM             Index        RIGHT ATRIUM           Index LA diam:        4.40 cm 2.72 cm/m   RA Area:     11.00 cm LA Vol (A2C):   52.2 ml 32.32 ml/m  RA Volume:   22.10 ml  13.68 ml/m LA Vol (A4C):   59.7 ml 36.97 ml/m LA Biplane Vol: 59.3 ml 36.72 ml/m  AORTIC VALVE LVOT Vmax:   81.70 cm/s LVOT Vmean:  58.400 cm/s LVOT VTI:    0.154 m  AORTA Ao Root diam: 3.00 cm Ao Asc diam:  3.20 cm MITRAL VALVE               TRICUSPID VALVE MV Area (PHT): 3.83 cm    TR Peak grad:   14.7 mmHg MV Decel  Time: 198 msec    TR Vmax:        192.00 cm/s MV E velocity: 73.10 cm/s                            SHUNTS                            Systemic VTI:  0.15 m                            Systemic Diam: 1.80 cm Jodelle Red MD Electronically signed by Jodelle Red MD Signature Date/Time: 04/03/2023/12:04:37 PM    Final      Scheduled Meds:  diltiazem  10 mg Intravenous Once   metoprolol succinate  25 mg Oral BID   rivaroxaban  20 mg Oral Q supper    Continuous Infusions:  sodium chloride     diltiazem (CARDIZEM) infusion Stopped (04/03/23 2215)     LOS: 2 days    Time spent:    Zannie Cove, MD [facility]   04/04/2023, 9:34 AM

## 2023-04-04 NOTE — Progress Notes (Signed)
Heart Failure Navigator Progress Note  Assessed for Heart & Vascular TOC clinic readiness.  Patient does not meet criteria due to No HF TOC per Dr. Jomarie Longs..   Navigator available for reassessment of patient.   Rhae Hammock, BSN, Scientist, clinical (histocompatibility and immunogenetics) Only

## 2023-04-05 ENCOUNTER — Inpatient Hospital Stay (HOSPITAL_COMMUNITY): Payer: Medicare PPO | Admitting: Anesthesiology

## 2023-04-05 ENCOUNTER — Encounter (HOSPITAL_COMMUNITY)
Admission: EM | Disposition: A | Discharge status: Home / Self Care | Payer: Self-pay | Source: Home / Self Care | Attending: Internal Medicine

## 2023-04-05 ENCOUNTER — Encounter (HOSPITAL_COMMUNITY): Payer: Self-pay | Admitting: Internal Medicine

## 2023-04-05 DIAGNOSIS — I4891 Unspecified atrial fibrillation: Secondary | ICD-10-CM | POA: Diagnosis not present

## 2023-04-05 DIAGNOSIS — I4892 Unspecified atrial flutter: Secondary | ICD-10-CM

## 2023-04-05 DIAGNOSIS — I110 Hypertensive heart disease with heart failure: Secondary | ICD-10-CM

## 2023-04-05 DIAGNOSIS — I509 Heart failure, unspecified: Secondary | ICD-10-CM

## 2023-04-05 HISTORY — PX: CARDIOVERSION: SHX1299

## 2023-04-05 LAB — BASIC METABOLIC PANEL
Anion gap: 5 (ref 5–15)
Anion gap: 7 (ref 5–15)
BUN: 12 mg/dL (ref 8–23)
BUN: 9 mg/dL (ref 8–23)
CO2: 21 mmol/L — ABNORMAL LOW (ref 22–32)
CO2: 25 mmol/L (ref 22–32)
Calcium: 7.7 mg/dL — ABNORMAL LOW (ref 8.9–10.3)
Calcium: 8.1 mg/dL — ABNORMAL LOW (ref 8.9–10.3)
Chloride: 104 mmol/L (ref 98–111)
Chloride: 105 mmol/L (ref 98–111)
Creatinine, Ser: 0.67 mg/dL (ref 0.44–1.00)
Creatinine, Ser: 0.72 mg/dL (ref 0.44–1.00)
GFR, Estimated: >60 mL/min (ref >60)
GFR, Estimated: >60 mL/min (ref >60)
Glucose, Bld: 72 mg/dL (ref 70–99)
Glucose, Bld: 95 mg/dL (ref 70–99)
Potassium: 3.9 mmol/L (ref 3.5–5.1)
Potassium: 4.4 mmol/L (ref 3.5–5.1)
Sodium: 133 mmol/L — ABNORMAL LOW (ref 135–145)
Sodium: 134 mmol/L — ABNORMAL LOW (ref 135–145)

## 2023-04-05 LAB — MAGNESIUM: Magnesium: 1.9 mg/dL (ref 1.7–2.4)

## 2023-04-05 SURGERY — CARDIOVERSION
Anesthesia: General

## 2023-04-05 MED ORDER — POTASSIUM CHLORIDE CRYS ER 20 MEQ PO TBCR: 40.0000 meq | EXTENDED_RELEASE_TABLET | Freq: Once | ORAL | Status: DC

## 2023-04-05 MED ORDER — SODIUM CHLORIDE 0.9 % IV SOLN: INTRAVENOUS | Status: DC | PRN

## 2023-04-05 MED ORDER — SODIUM CHLORIDE 0.9% FLUSH: 3.0000 mL | INTRAVENOUS | Status: DC | PRN

## 2023-04-05 MED ORDER — SODIUM CHLORIDE 0.9% FLUSH
3.0000 mL | Freq: Two times a day (BID) | INTRAVENOUS | Status: DC
  Administered 2023-04-05 – 2023-04-08 (×6): 3 mL via INTRAVENOUS

## 2023-04-05 MED ORDER — POTASSIUM CHLORIDE CRYS ER 20 MEQ PO TBCR
40.0000 meq | EXTENDED_RELEASE_TABLET | Freq: Once | ORAL | Status: AC
  Administered 2023-04-05: 40 meq via ORAL
  Filled 2023-04-05: qty 2

## 2023-04-05 MED ORDER — DOFETILIDE 500 MCG PO CAPS
500.0000 ug | ORAL_CAPSULE | Freq: Two times a day (BID) | ORAL | Status: DC
  Administered 2023-04-05 – 2023-04-07 (×4): 500 ug via ORAL
  Filled 2023-04-05 (×6): qty 1

## 2023-04-05 MED ORDER — MAGNESIUM SULFATE 2 GM/50ML IV SOLN
2.0000 g | Freq: Once | INTRAVENOUS | Status: AC
  Administered 2023-04-05: 2 g via INTRAVENOUS
  Filled 2023-04-05: qty 50

## 2023-04-05 MED ORDER — LIDOCAINE HCL (CARDIAC) PF 100 MG/5ML IV SOSY
PREFILLED_SYRINGE | INTRAVENOUS | Status: DC | PRN
  Administered 2023-04-05: 40 mg via INTRAVENOUS

## 2023-04-05 MED ORDER — ALPRAZOLAM 0.25 MG PO TABS
0.2500 mg | ORAL_TABLET | Freq: Three times a day (TID) | ORAL | Status: DC | PRN
  Administered 2023-04-05 – 2023-04-07 (×2): 0.25 mg via ORAL
  Filled 2023-04-05 (×2): qty 1

## 2023-04-05 MED ORDER — SODIUM CHLORIDE 0.9 % IV SOLN: 250.0000 mL | INTRAVENOUS | Status: DC | PRN

## 2023-04-05 MED ORDER — PROPOFOL 10 MG/ML IV BOLUS
INTRAVENOUS | Status: DC | PRN
  Administered 2023-04-05: 60 mg via INTRAVENOUS

## 2023-04-05 SURGICAL SUPPLY — 1 items: ELECT DEFIB PAD ADLT CADENCE (PAD) ×2 IMPLANT

## 2023-04-05 NOTE — CV Procedure (Signed)
Procedure:   DCCV  Indication:  Symptomatic atrial fibrillation/flutter  Procedure Note:  The patient signed informed consent.  They have had had therapeutic anticoagulation with rivaroxaban greater than 3 weeks.  Anesthesia was administered by Dr. Hyacinth Meeker.  Patient received 40 mg IV lidocaine and 60 mg IV propofol.Adequate airway was maintained throughout and vital followed per protocol.  They were cardioverted x 1 with 120J of biphasic synchronized energy.  She was intially junctional rhythm in the 20s, then sinus beat followed by 4-5 beats of SVT. Given that this was underlying sinus, and there were still rare junctional beats in the 20s, no further shocks performed.  There were no apparent complications.  The patient had normal neuro status and respiratory status post procedure with vitals stable as recorded elsewhere.    Follow up:  They will continue on current medical therapy and follow up with cardiology as scheduled.  Jodelle Red, MD PhD 04/05/2023 8:29 AM

## 2023-04-05 NOTE — Progress Notes (Signed)
Pharmacy: Dofetilide (Tikosyn) - Initial Consult Assessment and Electrolyte Replacement  Pharmacy consulted to assist in monitoring and replacing electrolytes in this 82 y.o. female admitted on 04/01/2023 undergoing dofetilide initiation. First dofetilide dose: 500 mcg @2000   Assessment:  Patient Exclusion Criteria: If any screening criteria checked as "Yes", then  patient  should NOT receive dofetilide until criteria item is corrected.  If "Yes" please indicate correction plan.  YES  NO Patient  Exclusion Criteria Correction Plan   []   [x]   Baseline QTc interval is greater than or equal to 440 msec. IF above YES box checked dofetilide contraindicated unless patient has ICD; then may proceed if QTc 500-550 msec or with known ventricular conduction abnormalities may proceed with QTc 550-600 msec. QTc = 0.45    []   [x]   Patient is known or suspected to have a digoxin level greater than 2 ng/ml: No results found for: "DIGOXIN"     []   [x]   Creatinine clearance less than 20 ml/min (calculated using Cockcroft-Gault, actual body weight and serum creatinine):  CrCl- ~64 m/min    []   [x]  Patient has received drugs known to prolong the QT intervals within the last 48 hours (phenothiazines, tricyclics or tetracyclic antidepressants, erythromycin, H-1 antihistamines, cisapride, fluoroquinolones, azithromycin, ondansetron).   Updated information on QT prolonging agents is available to be searched on the following database:QT prolonging agents     []   [x]   Patient received a dose of hydrochlorothiazide (Oretic) alone or in any combination including triamterene (Dyazide, Maxzide) in the last 48 hours.    []   [x]  Patient received a medication known to increase dofetilide plasma concentrations prior to initial dofetilide dose:  Trimethoprim (Primsol, Proloprim) in the last 36 hours Verapamil (Calan, Verelan) in the last 36 hours or a sustained release dose in the last 72 hours Megestrol  (Megace) in the last 5 days  Cimetidine (Tagamet) in the last 6 hours Ketoconazole (Nizoral) in the last 24 hours Itraconazole (Sporanox) in the last 48 hours  Prochlorperazine (Compazine) in the last 36 hours     []   [x]   Patient is known to have a history of torsades de pointes; congenital or acquired long QT syndromes.    []   [x]   Patient has received a Class 1 antiarrhythmic with less than 2 half-lives since last dose. (Disopyramide, Quinidine, Procainamide, Lidocaine, Mexiletine, Flecainide, Propafenone)    []   [x]   Patient has received amiodarone therapy in the past 3 months or amiodarone level is greater than 0.3 ng/ml.    Labs:    Component Value Date/Time   K 4.4 04/05/2023 0008   MG 2.1 04/04/2023 0015     Plan: Select One Calculated CrCl  Dose q12h  [x]  > 60 ml/min 500 mcg  []  40-60 ml/min 250 mcg  []  20-40 ml/min 125 mcg   [x]   Physician selected initial dose within range recommended for patients level of renal function - will monitor for response.  []   Physician selected initial dose outside of range recommended for patients level of renal function - will discuss if the dose should be altered at this time.   Patient has been appropriately anticoagulated with Xarelto.  Potassium: K 3.8-3.9:  Hold Tikosyn initiation and give KCl 40 mEq po x1 then begin Tikosyn at least 2hr after KCl dose - do not need to recheck K   Magnesium: Mg 1.8-2: Give Mg 2 gm IV x1 to prevent Mg from dropping below 1.8 - do not need to recheck Mg. Appropriate to  initiate Tikosyn   Thank you for allowing pharmacy to participate in this patient's care   Yolanda Bell, PharmD. [facility] Acute Care PGY-1 04/05/2023 3:21 PM

## 2023-04-05 NOTE — Interval H&P Note (Signed)
History and Physical Interval Note:  04/05/2023 8:09 AM  Yolanda Bell  has presented today for surgery, with the diagnosis of afib/flutter.  The various methods of treatment have been discussed with the patient and family. After consideration of risks, benefits and other options for treatment, the patient has consented to  Procedure(s): CARDIOVERSION (N/A) as a surgical intervention.  The patient's history has been reviewed, patient examined, no change in status, stable for surgery.  I have reviewed the patient's chart and labs.  Questions were answered to the patient's satisfaction.     Odean Mcelwain Cristal Deer

## 2023-04-05 NOTE — Progress Notes (Signed)
Heart rate  constantly goes  up  to 170's  a- flutter then slows down to 30's sinus. Complained of feeling lightheaded. MD made aware with new order. Xanax .25 po given continue to monitor.

## 2023-04-05 NOTE — Progress Notes (Signed)
Back from the cath lab post cardioversion by bed awake and alert. Heart rate goes fast and then slows down.  Cardiologist  made aware and seen pt at once. Metoprolol 25 mg po given . Continue to monitor.

## 2023-04-05 NOTE — Transfer of Care (Signed)
Immediate Anesthesia Transfer of Care Note  Patient: Yolanda Bell  Procedure(s) Performed: CARDIOVERSION  Patient Location: Cath Lab  Anesthesia Type:MAC  Level of Consciousness: drowsy  Airway & Oxygen Therapy: Patient Spontanous Breathing and Patient connected to nasal cannula oxygen  Post-op Assessment: Report given to RN and Post -op Vital signs reviewed and stable  Post vital signs: Reviewed and stable  Last Vitals:  Vitals Value Taken Time  BP    Temp    Pulse 83 04/05/23 0808  Resp 17 04/05/23 0808  SpO2 98 % 04/05/23 0808  Vitals shown include unvalidated device data.  Last Pain:  Vitals:   04/05/23 0400  TempSrc: Oral  PainSc:          Complications: No notable events documented.

## 2023-04-05 NOTE — Progress Notes (Addendum)
Progress note   Patient ID: Yolanda Bell MRN: [id number]; DOB: [birth year]  Admit date: 04/01/2023 Date of Consult: 04/05/2023  PCP:  Yolanda Cipro, NP   [facility] Providers Cardiologist:  Yolanda Miss, MD  Electrophysiologist:  Yolanda Lemming, MD    Patient Profile:   Yolanda Bell is a 82 y.o. female with a hx of constrictive pericarditis status post pericardial stripping in [facility] 2006, SVT post ablation by Dr. Graciela Husbands, hypertension, gout, atrial fibrillation/flutter (post PVI ablation 2017)  who is being seen 04/05/2023 for the evaluation of AFib at the request of Dr. Royann Bell.  History of Present Illness:   Yolanda Bell last saw Dr. Elberta Bell 08/02/22, unfortunately had had an eventful few previous months.  She had diverticulitis and had surgery.  She also went into atrial fibrillation.  She wass status post cardioversion 04/09/2022, to the time of the visit maintaining SR since.  She saw Yolanda Bell 10/20/22, no symptoms of her Afib, no changes were made.  She saw Dr. Elease Bell 12/22/22, she had an MR of the abdomen revealed a mass suspicious for papillary renal cell carcinoma, described as small, planned by her urologist to watch for now, and get a repeat MRI in may.  No symptoms of AF   She was admitted 04/01/23 with rapid palpitations that persisted despite use of her PRN betablocker, found in AFlutter w/RVR In the days/week prior she had noted some palpitations and taken PRN meds with improvement though then noted some slower rates 50's and skipped her PM metoprolol then fast again this seemed to go on for few days by chart review. She also reported an emotionally stressful event  she thinks may have set things off the day of her admission. Or perhaps an insect bite of some kind she wonders if maybe a reaction.  There was an unclear associate of symptoms with tachy or brady or both/neither. She was started on dilt gtt with rate improvement  She saw Dr. Graciela Husbands 6/22,  planned to pursue DCCV and outpt f/u for further discussion and management of her rhythm with Dr. Elberta Bell Rates slowed to 50's with patient suspect perhaps when she thought she was going in/out of rhythm, perhaps just faster/slower.  Unable to DCCV over the weekend or yesterday with recurrent RVR as well.  Today  They were cardioverted x 1 with 120J of biphasic synchronized energy.  She was intially junctional rhythm in the 20s, then sinus beat followed by 4-5 beats of SVT. Given that this was underlying sinus, and there were still rare junctional beats in the 20s, no further shocks given Once back in her bed, in another tachycardia/rhythm, felt to be an ATach With post conversion pausing/bradycardia and tachy EP is asked to revisit for further recommendations.  LABS K+ 4.4 Mag yesterday 2.1  Creat 0.67 (calc CrCl is 64) QTc in SR is OK   She is feeling better current, she suspects mostly 2/2 the Xanax, no CP, no SOB She was feeling the waves of slow mostly, like a wave washing over her  Past Medical History:  Diagnosis Date   Atrial fibrillation (HCC)    Status post TEE cardioversion   Constrictive pericarditis    2 status post pericardial stripping at the [facility]   Gout    Hypertension    Spastic dysphonia    Supraventricular tachycardia    Status post RF ablation by Dr. Anabel Halon    Past Surgical History:  Procedure Laterality Date   ABDOMINAL  HYSTERECTOMY     1987   BOWEL RESECTION N/A 06/03/2022   Procedure: SMALL BOWEL RESECTION;  Surgeon: Yolanda Adu, MD;  [facility] OR;  Service: General;  Laterality: N/A;   CARDIOVERSION  11/21/2012   Procedure: CARDIOVERSION;  Surgeon: [name], MD;  Location: [facility] ENDOSCOPY;  Service: Cardiovascular;;   CARDIOVERSION N/A 06/10/2014   Procedure: CARDIOVERSION;  Surgeon: Yolanda Mixer, MD;  Location: [facility] ENDOSCOPY;  Service: Cardiovascular;  Laterality: N/A;   CARDIOVERSION N/A 10/14/2015   Procedure: CARDIOVERSION;   Surgeon: Yolanda Stade, MD;  Location: [facility] ENDOSCOPY;  Service: Cardiovascular;  Laterality: N/A;   CARDIOVERSION N/A 05/12/2016   Procedure: CARDIOVERSION;  Surgeon: Yolanda Si, MD;  Location: [facility] ENDOSCOPY;  Service: Cardiovascular;  Laterality: N/A;   CARDIOVERSION N/A 04/09/2022   Procedure: CARDIOVERSION;  Surgeon: Yolanda Poisson, MD;  Location: [facility] ENDOSCOPY;  Service: Cardiovascular;  Laterality: N/A;   CHOLECYSTECTOMY     1989   ELECTROPHYSIOLOGIC STUDY N/A 08/20/2016   Procedure: Atrial Fibrillation Ablation;  Surgeon: Yolanda Lowe Jorja Loa, MD;  Location: [facility] INVASIVE CV LAB;  Service: Cardiovascular;  Laterality: N/A;   EYE SURGERY     cataracts   LAPAROTOMY N/A 06/03/2022   Procedure: EXPLORATORY LAPAROTOMY;  Surgeon: Yolanda Adu, MD;  Location: [facility] OR;  Service: General;  Laterality: N/A;   PERICARDIECTOMY     TEE WITHOUT CARDIOVERSION N/A 06/10/2014   Procedure: TRANSESOPHAGEAL ECHOCARDIOGRAM (TEE);  Surgeon: Yolanda Mixer, MD;  Location: [facility] ENDOSCOPY;  Service: Cardiovascular;  Laterality: N/A;   TONSILLECTOMY       Home Medications:  Prior to Admission medications   Medication Sig Start Date End Date Taking? Authorizing Provider  Ascorbic Acid (VITAMIN C PO) Take 1 tablet by mouth daily.   Yes [provider]  Calcium Carb-Cholecalciferol (CALCIUM + VITAMIN D3 PO) Take 1 tablet by mouth daily.   Yes [provider]  denosumab (PROLIA) 60 MG/ML SOSY injection Inject 60 mg into the skin every 6 (six) months.   Yes [provider]  DENTA 5000 PLUS 1.1 % CREA dental cream Place 1 application  onto teeth 2 (two) times daily. 05/31/21  Yes [provider]  diltiazem (CARDIZEM) 60 MG tablet Take 1 tablet (60 mg total) by mouth every 12 (twelve) hours as needed (for heart rate > 110 bpm). 04/02/23  Yes Sponseller, Rebekah R, PA-C  metoprolol succinate (TOPROL-XL) 25 MG 24 hr tablet Take one tablet by mouth (25 mg) twice a day. 04/09/22  Yes  Yolanda Poisson, MD  propranolol (INDERAL) 10 MG tablet TAKE 1 TABLET BY MOUTH 4 TIMES A DAY AS NEEDED Patient taking differently: Take 10 mg by mouth 4 (four) times daily as needed (for heart rate of 130). 04/30/22  Yes Nahser, Deloris Ping, MD  spironolactone (ALDACTONE) 50 MG tablet TAKE 1 TABLET BY MOUTH EVERY DAY Patient taking differently: Take 50 mg by mouth daily. 11/18/22  Yes Nahser, Deloris Ping, MD  XARELTO 20 MG TABS tablet TAKE 1 TABLET BY MOUTH DAILY WITH SUPPER 11/26/22  Yes Nahser, Deloris Ping, MD    Inpatient Medications: Scheduled Meds:  metoprolol succinate  25 mg Oral BID   rivaroxaban  20 mg Oral Q supper   Continuous Infusions:  diltiazem (CARDIZEM) infusion Stopped (04/05/23 0657)   PRN Meds: acetaminophen, bacitracin, melatonin, ondansetron (ZOFRAN) IV, polyethylene glycol  Allergies:    Allergies  Allergen Reactions   Corticosteroids Other (See Comments)    Liver scarring as a child   Covid-19 (Mrna)  Vaccine Other (See Comments)    Postural Orthostatic Tachycardia Syndrome (POTS)   Other Other (See Comments)    Cannot take STEROIDS-unknown reaction-caused by internal scarring on liver.    Anesthesia precaution due to excessive liver scarring    Pacerone [Amiodarone] Other (See Comments)    Dizziness Visual hallucinations   Erythromycin Nausea And Vomiting and Rash   Zyloprim [Allopurinol] Rash and Other (See Comments)    Skin peeling More severe gout     Social History:   Social History   Socioeconomic History   Marital status: Divorced    Spouse name: Not on file   Number of children: Not on file   Years of education: Not on file   Highest education level: Not on file  Occupational History   Not on file  Tobacco Use   Smoking status: Never   Smokeless tobacco: Never  Vaping Use   Vaping Use: Never used  Substance and Sexual Activity   Alcohol use: Yes    Comment: occasionally   Drug use: No   Sexual activity: Not on file  Other Topics Concern    Not on file  Social History Narrative   Not on file   Social Determinants of Health   Financial Resource Strain: Not on file  Food Insecurity: No Food Insecurity (04/02/2023)   Hunger Vital Sign    Worried About Running Out of Food in the Last Year: Never true    Ran Out of Food in the Last Year: Never true  Transportation Needs: No Transportation Needs (04/02/2023)   PRAPARE - Administrator, Civil Service (Medical): No    Lack of Transportation (Non-Medical): No  Physical Activity: Not on file  Stress: Not on file  Social Connections: Not on file  Intimate Partner Violence: Not At Risk (04/02/2023)   Humiliation, Afraid, Rape, and Kick questionnaire    Fear of Current or Ex-Partner: No    Emotionally Abused: No    Physically Abused: No    Sexually Abused: No    Family History:   Family History  Problem Relation Age of Onset   Heart disease Mother    Emphysema Father    Breast cancer Sister      ROS:  Please see the history of present illness.  All other ROS reviewed and negative.     Physical Exam/Data:   Vitals:   04/05/23 0840 04/05/23 0850 04/05/23 0937 04/05/23 1117  BP: (!) 108/52 120/74 (!) 145/81 (!) 121/50  Pulse: 63 70 (!) 121 71  Resp: 17 12  13   Temp:  98 F (36.7 C)  97.6 F (36.4 C)  TempSrc:    Oral  SpO2: 98% 98%  93%  Weight:      Height:        Intake/Output Summary (Last 24 hours) at 04/05/2023 1417 Last data filed at 04/05/2023 1127 Gross per 24 hour  Intake 535.92 ml  Output 2600 ml  Net -2064.08 ml      04/01/2023   11:47 PM 12/22/2022   11:19 AM 10/20/2022   11:03 AM  Last 3 Weights  Weight (lbs) 134 lb 7.7 oz 135 lb 6.4 oz 136 lb 3.2 oz  Weight (kg) 61 kg 61.417 kg 61.78 kg     Body mass index is 24.6 kg/m.  General:  Well nourished, well developed, in no acute distress HEENT: normal Neck: no JVD Vascular: No carotid bruits Cardiac:   RRR; frequent extrasystoles, no murmurs Lungs:  CTA b/l,  no wheezing, rhonchi  or rales  Abd: soft, nontender  Ext: no edema Musculoskeletal:  No deformities, Skin: warm and dry  Neuro:  she has a known vocal and peripheral tremor, no focal abnormalities noted Psych:  Normal affect   EKG:  The EKG was personally reviewed and demonstrates:    Atypical AFlutter 131bpm  Today Looks like ATach with intermittent 1:1 and 2:1 @ 135bpm  Telemetry:  Telemetry was personally reviewed and demonstrates:   Post DCCV she spent hours in/out of RVR (suspect flutter vs Atach 170's-180's and brief pausing/brady she has settled to now SR with very frequent PACs without the pausing  Relevant CV Studies:   04/03/23: TTE  1. Left ventricular ejection fraction, by estimation, is 40 to 45%. The  left ventricle has mildly decreased function. The left ventricle  demonstrates global hypokinesis. Left ventricular diastolic parameters are  indeterminate.   2. Right ventricular systolic function is mildly reduced. The right  ventricular size is normal. There is normal pulmonary artery systolic  pressure. The estimated right ventricular systolic pressure is 22.7 mmHg.   3. Left atrial size was mildly dilated.   4. The mitral valve is abnormal. Mild mitral valve regurgitation. No  evidence of mitral stenosis.   5. The aortic valve is tricuspid. There is mild calcification of the  aortic valve. There is mild thickening of the aortic valve. Aortic valve  regurgitation is not visualized. Aortic valve sclerosis is present, with  no evidence of aortic valve stenosis.   6. The inferior vena cava is dilated in size with >50% respiratory  variability, suggesting right atrial pressure of 8 mmHg.   Comparison(s): Changes from prior study are noted. EF decreased slightly  compared to prior.   06/23/2021 TTE IMPRESSIONS   1. Left ventricular ejection fraction, by estimation, is 50 to 55%. The  left ventricle has low normal function. The left ventricle has no regional  wall motion  abnormalities. Left ventricular diastolic parameters were  normal.   2. Right ventricular systolic function is normal. The right ventricular  size is normal. There is normal pulmonary artery systolic pressure.   3. Left atrial size was moderately dilated.   4. The mitral valve is normal in structure. Trivial mitral valve  regurgitation. No evidence of mitral stenosis.   5. Tricuspid valve regurgitation is moderate.   6. The aortic valve is tricuspid. Aortic valve regurgitation is not  visualized. No aortic stenosis is present.   7. The inferior vena cava is normal in size with greater than 50%  respiratory variability, suggesting right atrial pressure of 3 mmHg.    08/20/2016: EPS/ablation CONCLUSIONS: 1. Sinus rhythm upon presentation.   2. Successful electrical isolation and anatomical encircling of all four pulmonary veins with radiofrequency current. 3. No inducible arrhythmias following ablation both on and off of dobutamine 4. No early apparent complications.  Laboratory Data:  High Sensitivity Troponin:   Recent Labs  Lab 04/01/23 2352 04/02/23 0254  TROPONINIHS 18* 33*     Chemistry Recent Labs  Lab 04/01/23 2352 04/03/23 0017 04/04/23 0015 04/05/23 0008  NA 135 133* 134* 134*  K 4.2 3.8 4.0 4.4  CL 98 103 103 104  CO2 24 23 24 25   GLUCOSE 104* 91 100* 95  BUN 11 14 14 12   CREATININE 1.19* 0.70 0.87 0.67  CALCIUM 9.4 8.2* 8.2* 8.1*  MG 1.9 1.8 2.1  --   GFRNONAA 46* >60 >60 >60  ANIONGAP 13 7 7  5  Recent Labs  Lab 04/03/23 0017 04/04/23 0015  PROT 5.6* 5.8*  ALBUMIN 3.4* 3.5  AST 14* 16  ALT 10 12  ALKPHOS 37* 41  BILITOT 1.0 0.9   Lipids No results for input(s): "CHOL", "TRIG", "HDL", "LABVLDL", "LDLCALC", "CHOLHDL" in the last 168 hours.  Hematology Recent Labs  Lab 04/01/23 2352 04/03/23 0017 04/04/23 0015  WBC 9.3 5.7 7.1  RBC 4.24 3.59* 3.72*  HGB 13.3 11.2* 11.3*  HCT 39.4 32.4* 34.0*  MCV 92.9 90.3 91.4  MCH 31.4 31.2 30.4   MCHC 33.8 34.6 33.2  RDW 15.6* 15.7* 15.6*  PLT 301 204 187   Thyroid  Recent Labs  Lab 04/03/23 0017  TSH 3.096    BNPNo results for input(s): "BNP", "PROBNP" in the last 168 hours.  DDimer No results for input(s): "DDIMER" in the last 168 hours.   Radiology/Studies:    DG Chest 1 View Result Date: 04/02/2023 CLINICAL DATA:  Chest pain EXAM: CHEST  1 VIEW COMPARISON:  Radiograph 06/20/2021 FINDINGS: Stable cardiomegaly. Aortic atherosclerotic calcification. Bibasilar atelectasis/scarring. Chronic bronchitic changes. No focal pneumonia, pleural effusion, or pneumothorax. Chronic posttraumatic deformities both humeral heads. No acute displaced rib fracture. IMPRESSION: No active disease. Electronically Signed   By: Minerva Fester M.D.   On: 04/02/2023 00:25     Assessment and Plan:   Paroxysmal Afib Atypical AFlutter A tach CHA2DS2Vasc is 4, on Xarelto S/p DCCV today as discussed above  Plan for Tikosyn Labs are OK Her calc CrCl is 64 > plan for dose to start No need to repeat mag today was 2.1 yesterday Last SR EKDG was reviewed with Dr. Elberta Bell, QTc acceptable  Discussed with the patient addition 3.5 days at least in the hospital, discussed potential risks/benefits, she is agreeable  Doubt a great ablation candidate given we have seen now 3 different atrial arrhythmias   Risk Assessment/Risk Scores:   For questions or updates, please contact [facility] Please consult [email or web address] for contact info under    Signed, Sheilah Pigeon, PA-C  04/05/2023 2:17 PM  I have seen and examined this patient with Francis Dowse.  Agree with above, note added to reflect my findings.  Patient admitted to the hospital with tachybradycardia syndrome.  She has atrial fibrillation and has had multiple ablations.  She feels poorly when she is bradycardic and also poorly when she is in atrial fibrillation.  When she is in normal rhythm, she has not had any obvious  bradycardia.  GEN: Well nourished, well developed, in no acute distress  HEENT: normal  Neck: no JVD, carotid bruits, or masses Cardiac: RRR; no murmurs, rubs, or gallops,no edema  Respiratory:  clear to auscultation bilaterally, normal work of breathing GI: soft, nontender, nondistended, + BS MS: no deformity or atrophy  Skin: warm and dry Neuro:  Strength and sensation are intact Psych: euthymic mood, full affect   Paroxysmal atrial fibrillation/flutter: CHA2DS2-VASc of 4 on Xarelto.  She has had episodes of both tachycardia and bradycardia.  She is benefit from a rhythm control strategy.  She did not tolerate amiodarone.  Ablation versus medication management would be reasonable.  As ablations are being scheduled out many months, we Shawntina Diffee plan for dofetilide load.  Myeesha Shane M. Mikhail Hallenbeck MD 04/05/2023 4:13 PM

## 2023-04-05 NOTE — Progress Notes (Signed)
PROGRESS NOTE    Yolanda NAJARIAN  Bell:[id number] DOB: 1941-04-09 DOA: 04/01/2023 PCP: Moshe Cipro, NP   Yolanda Bell is a 82 y.o. female with medical history significant of Paroxysmal Atrial Fibrillation on Xarelto, constrictive pericarditis with perciardial stripping at [facility], HTN, liver disease, and spasmodic dystonia. She has had multiple cardioversions and ablations in the past.  -Presented to [facility] ED for tachycardia and palpitations despite taking 3 doses of propranolol at home -Admitted, started on Cardizem gtt., cardiology consulting -6/25 status post TEE/cardioversion, back in a flutter/A-fib and bradycardia, pauses -EP consulted  Subjective: -Back from cardioversion, now with atrial flutter, atrial tachycardia, pauses  Assessment and Plan:  Atrial fibrillation/flutter with RVR Tachybradycardia syndrome -Continue Toprol and Xarelto -Cards consulting, recurrent arrhythmias post cardioversion today -Concern for tachybradycardia syndrome, EP consulted  Chronic systolic CHF -EF down to 40-45%, mildly reduced RV on echo, previously 50-55% -Likely secondary to A-fib RVR -Clinically euvolemic, restart Aldactone tomorrow if blood pressure is stable  Hypertension -Stable continue metoprolol and Cardizem gtt., Aldactone on hold  History of constrictive pericarditis -Status post pericardial stripping at [facility] 2006, stable  Left renal lesion -Stable, needs repeat surveillance in  Insect bite -Left inner thigh, site appears unremarkable at this time  DVT prophylaxis: Xarelto Code Status: Full code Family Communication: None present Disposition Plan: Home likely 1 to 2 days  Consultants: Cards, EP   Procedures:   Antimicrobials:    Objective: Vitals:   04/05/23 0840 04/05/23 0850 04/05/23 0937 04/05/23 1117  BP: (!) 108/52 120/74 (!) 145/81 (!) 121/50  Pulse: 63 70 (!) 121 71  Resp: 17 12  13   Temp:  98 F (36.7 C)  97.6 F (36.4 C)  TempSrc:     Oral  SpO2: 98% 98%  93%  Weight:      Height:        Intake/Output Summary (Last 24 hours) at 04/05/2023 1126 Last data filed at 04/05/2023 8119 Gross per 24 hour  Intake 430.92 ml  Output 2400 ml  Net -1969.08 ml   Filed Weights   04/01/23 2347  Weight: 61 kg    Examination:  General exam: Pleasant elderly female sitting up in bed, AAOx3 HEENT: No JVD CVS: S1-S2, irregular rhythm Lungs: Clear bilaterally Abdomen: Soft, nontender, bowel sounds present Extremities: No edema Skin: No rashes Psychiatry:  Mood & affect appropriate.     Data Reviewed:   CBC: Recent Labs  Lab 04/01/23 2352 04/03/23 0017 04/04/23 0015  WBC 9.3 5.7 7.1  HGB 13.3 11.2* 11.3*  HCT 39.4 32.4* 34.0*  MCV 92.9 90.3 91.4  PLT 301 204 187   Basic Metabolic Panel: Recent Labs  Lab 04/01/23 2352 04/03/23 0017 04/04/23 0015 04/05/23 0008  NA 135 133* 134* 134*  K 4.2 3.8 4.0 4.4  CL 98 103 103 104  CO2 24 23 24 25   GLUCOSE 104* 91 100* 95  BUN 11 14 14 12   CREATININE 1.19* 0.70 0.87 0.67  CALCIUM 9.4 8.2* 8.2* 8.1*  MG 1.9 1.8 2.1  --    GFR: Estimated Creatinine Clearance: 48.3 mL/min (by C-G formula based on SCr of 0.67 mg/dL). Liver Function Tests: Recent Labs  Lab 04/03/23 0017 04/04/23 0015  AST 14* 16  ALT 10 12  ALKPHOS 37* 41  BILITOT 1.0 0.9  PROT 5.6* 5.8*  ALBUMIN 3.4* 3.5   No results for input(s): "LIPASE", "AMYLASE" in the last 168 hours. No results for input(s): "AMMONIA" in the last 168 hours. Coagulation  Profile: Recent Labs  Lab 04/01/23 2352  INR 2.9*   Cardiac Enzymes: No results for input(s): "CKTOTAL", "CKMB", "CKMBINDEX", "TROPONINI" in the last 168 hours. BNP (last 3 results) No results for input(s): "PROBNP" in the last 8760 hours. HbA1C: No results for input(s): "HGBA1C" in the last 72 hours. CBG: No results for input(s): "GLUCAP" in the last 168 hours. Lipid Profile: No results for input(s): "CHOL", "HDL", "LDLCALC", "TRIG",  "CHOLHDL", "LDLDIRECT" in the last 72 hours. Thyroid Function Tests: Recent Labs    04/03/23 0017  TSH 3.096   Anemia Panel: No results for input(s): "VITAMINB12", "FOLATE", "FERRITIN", "TIBC", "IRON", "RETICCTPCT" in the last 72 hours. Urine analysis:    Component Value Date/Time   COLORURINE YELLOW 06/01/2022 0243   APPEARANCEUR CLEAR 06/01/2022 0243   LABSPEC 1.044 (H) 06/01/2022 0243   PHURINE 5.0 06/01/2022 0243   GLUCOSEU NEGATIVE 06/01/2022 0243   HGBUR NEGATIVE 06/01/2022 0243   BILIRUBINUR NEGATIVE 06/01/2022 0243   KETONESUR 5 (A) 06/01/2022 0243   PROTEINUR NEGATIVE 06/01/2022 0243   NITRITE NEGATIVE 06/01/2022 0243   LEUKOCYTESUR NEGATIVE 06/01/2022 0243   Sepsis Labs: @LABRCNTIP (procalcitonin:4,lacticidven:4)  )No results found for this or any previous visit (from the past 240 hour(s)).   Radiology Studies: EP STUDY  Result Date: 04/05/2023 See surgical note for result.    Scheduled Meds:  metoprolol succinate  25 mg Oral BID   rivaroxaban  20 mg Oral Q supper   Continuous Infusions:  diltiazem (CARDIZEM) infusion Stopped (04/05/23 0657)     LOS: 3 days    Time spent:    Zannie Cove, MD [facility]   04/05/2023, 11:26 AM

## 2023-04-05 NOTE — Progress Notes (Signed)
04/05/2023 1950 Pt transferred to 6E-[id number]. Yolanda Bell

## 2023-04-05 NOTE — Progress Notes (Signed)
Rounding Note    Patient Name: Yolanda Bell Date of Encounter: 04/05/2023  [facility] Cardiologist: Kristeen Miss, MD   Subjective   Seen after DCCV. CV successful, but she now has a different type of arrhythmia. Has brief bursts of atrial tachycardia and lengthier episodes of atrial flutter (?) with different morphology from the one she presented with last week. Has post conversion pauses of 2-3 seconds, junctional escape beats as well.  Inpatient Medications    Scheduled Meds:  metoprolol succinate  25 mg Oral BID   rivaroxaban  20 mg Oral Q supper   Continuous Infusions:  diltiazem (CARDIZEM) infusion Stopped (04/05/23 0657)   PRN Meds: acetaminophen, bacitracin, melatonin, ondansetron (ZOFRAN) IV, polyethylene glycol   Vital Signs    Vitals:   04/05/23 0829 04/05/23 0840 04/05/23 0850 04/05/23 0937  BP: (!) 92/52 (!) 108/52 120/74 (!) 145/81  Pulse: 61 63 70 (!) 121  Resp: 17 17 12    Temp:   98 F (36.7 C)   TempSrc:      SpO2: 96% 98% 98%   Weight:      Height:        Intake/Output Summary (Last 24 hours) at 04/05/2023 0955 Last data filed at 04/05/2023 0655 Gross per 24 hour  Intake 435.92 ml  Output 3100 ml  Net -2664.08 ml      04/01/2023   11:47 PM 12/22/2022   11:19 AM 10/20/2022   11:03 AM  Last 3 Weights  Weight (lbs) 134 lb 7.7 oz 135 lb 6.4 oz 136 lb 3.2 oz  Weight (kg) 61 kg 61.417 kg 61.78 kg      Telemetry    Brief periods of sinus rhythm, runs of atrial tachycardia and atrial flutter, post conversion pauses up to 3 seconds , junctional escape beats- Personally Reviewed  ECG    Sinus rhythm with incessant 3-6 bursts of atrial tachycardia - Personally Reviewed  Physical Exam  Comfortable lying flat GEN: No acute distress.   Neck: No JVD Cardiac: rapid regular rhythm and pauses, no murmurs, rubs, or gallops.  Respiratory: Clear to auscultation bilaterally. GI: Soft, nontender, non-distended  MS: No edema; No  deformity. Neuro:  Nonfocal  Psych: Normal affect   Labs    High Sensitivity Troponin:   Recent Labs  Lab 04/01/23 2352 04/02/23 0254  TROPONINIHS 18* 33*     Chemistry Recent Labs  Lab 04/01/23 2352 04/03/23 0017 04/04/23 0015 04/05/23 0008  NA 135 133* 134* 134*  K 4.2 3.8 4.0 4.4  CL 98 103 103 104  CO2 24 23 24 25   GLUCOSE 104* 91 100* 95  BUN 11 14 14 12   CREATININE 1.19* 0.70 0.87 0.67  CALCIUM 9.4 8.2* 8.2* 8.1*  MG 1.9 1.8 2.1  --   PROT  --  5.6* 5.8*  --   ALBUMIN  --  3.4* 3.5  --   AST  --  14* 16  --   ALT  --  10 12  --   ALKPHOS  --  37* 41  --   BILITOT  --  1.0 0.9  --   GFRNONAA 46* >60 >60 >60  ANIONGAP 13 7 7 5     Lipids No results for input(s): "CHOL", "TRIG", "HDL", "LABVLDL", "LDLCALC", "CHOLHDL" in the last 168 hours.  Hematology Recent Labs  Lab 04/01/23 2352 04/03/23 0017 04/04/23 0015  WBC 9.3 5.7 7.1  RBC 4.24 3.59* 3.72*  HGB 13.3 11.2* 11.3*  HCT 39.4 32.4*  34.0*  MCV 92.9 90.3 91.4  MCH 31.4 31.2 30.4  MCHC 33.8 34.6 33.2  RDW 15.6* 15.7* 15.6*  PLT 301 204 187   Thyroid  Recent Labs  Lab 04/03/23 0017  TSH 3.096    BNPNo results for input(s): "BNP", "PROBNP" in the last 168 hours.  DDimer No results for input(s): "DDIMER" in the last 168 hours.   Radiology    EP STUDY  Result Date: 04/05/2023 See surgical note for result.  ECHOCARDIOGRAM COMPLETE  Result Date: 04/03/2023    ECHOCARDIOGRAM REPORT   Patient Name:   KEVIN MARIO Date of Exam: 04/03/2023 Medical Rec #:  [id number]      Height:       62.0 in Accession #:    [id number]     Weight:       134.5 lb Date of Birth:  July 24, 1941       BSA:          1.615 m Patient Age:    82 years       BP:           107/61 mmHg Patient Gender: F              HR:           87 bpm. Exam Location:  Inpatient Procedure: 2D Echo, Color Doppler and Cardiac Doppler Indications:    I48.92* Unspecified atrial flutter  History:        Patient has prior history of Echocardiogram  examinations, most                 recent 06/23/2021. CHF, Arrythmias:Atrial Fibrillation; Risk                 Factors:Hypertension.  Sonographer:    Irving Burton Senior RDCS Referring Phys: [id number] KATY L FOUST IMPRESSIONS  1. Left ventricular ejection fraction, by estimation, is 40 to 45%. The left ventricle has mildly decreased function. The left ventricle demonstrates global hypokinesis. Left ventricular diastolic parameters are indeterminate.  2. Right ventricular systolic function is mildly reduced. The right ventricular size is normal. There is normal pulmonary artery systolic pressure. The estimated right ventricular systolic pressure is 22.7 mmHg.  3. Left atrial size was mildly dilated.  4. The mitral valve is abnormal. Mild mitral valve regurgitation. No evidence of mitral stenosis.  5. The aortic valve is tricuspid. There is mild calcification of the aortic valve. There is mild thickening of the aortic valve. Aortic valve regurgitation is not visualized. Aortic valve sclerosis is present, with no evidence of aortic valve stenosis.  6. The inferior vena cava is dilated in size with >50% respiratory variability, suggesting right atrial pressure of 8 mmHg. Comparison(s): Changes from prior study are noted. EF decreased slightly compared to prior. FINDINGS  Left Ventricle: Left ventricular ejection fraction, by estimation, is 40 to 45%. The left ventricle has mildly decreased function. The left ventricle demonstrates global hypokinesis. The left ventricular internal cavity size was normal in size. There is  no left ventricular hypertrophy. Left ventricular diastolic parameters are indeterminate. Right Ventricle: The right ventricular size is normal. No increase in right ventricular wall thickness. Right ventricular systolic function is mildly reduced. There is normal pulmonary artery systolic pressure. The tricuspid regurgitant velocity is 1.92 m/s, and with an assumed right atrial pressure of 8 mmHg, the estimated  right ventricular systolic pressure is 22.7 mmHg. Left Atrium: Left atrial size was mildly dilated. Right Atrium: Right atrial size was normal in size. Pericardium:  Trivial pericardial effusion is present. Mitral Valve: The mitral valve is abnormal. Mild mitral valve regurgitation. No evidence of mitral valve stenosis. Tricuspid Valve: The tricuspid valve is normal in structure. Tricuspid valve regurgitation is mild. Aortic Valve: The aortic valve is tricuspid. There is mild calcification of the aortic valve. There is mild thickening of the aortic valve. Aortic valve regurgitation is not visualized. Aortic valve sclerosis is present, with no evidence of aortic valve stenosis. Pulmonic Valve: The pulmonic valve was not well visualized. Pulmonic valve regurgitation is trivial. Aorta: The aortic root, ascending aorta, aortic arch and descending aorta are all structurally normal, with no evidence of dilitation or obstruction. Venous: The inferior vena cava is dilated in size with greater than 50% respiratory variability, suggesting right atrial pressure of 8 mmHg. IAS/Shunts: The atrial septum is grossly normal.  LEFT VENTRICLE PLAX 2D LVIDd:         5.10 cm     Diastology LVIDs:         4.10 cm     LV e' medial:    6.74 cm/s LV PW:         0.80 cm     LV E/e' medial:  10.8 LV IVS:        0.80 cm     LV e' lateral:   10.90 cm/s LVOT diam:     1.80 cm     LV E/e' lateral: 6.7 LV SV:         39 LV SV Index:   24 LVOT Area:     2.54 cm  LV Volumes (MOD) LV vol d, MOD A2C: 74.1 ml LV vol d, MOD A4C: 92.0 ml LV vol s, MOD A2C: 44.3 ml LV vol s, MOD A4C: 51.8 ml LV SV MOD A2C:     29.8 ml LV SV MOD A4C:     92.0 ml LV SV MOD BP:      37.5 ml RIGHT VENTRICLE RV S prime:     6.64 cm/s TAPSE (M-mode): 1.2 cm LEFT ATRIUM             Index        RIGHT ATRIUM           Index LA diam:        4.40 cm 2.72 cm/m   RA Area:     11.00 cm LA Vol (A2C):   52.2 ml 32.32 ml/m  RA Volume:   22.10 ml  13.68 ml/m LA Vol (A4C):   59.7 ml  36.97 ml/m LA Biplane Vol: 59.3 ml 36.72 ml/m  AORTIC VALVE LVOT Vmax:   81.70 cm/s LVOT Vmean:  58.400 cm/s LVOT VTI:    0.154 m  AORTA Ao Root diam: 3.00 cm Ao Asc diam:  3.20 cm MITRAL VALVE               TRICUSPID VALVE MV Area (PHT): 3.83 cm    TR Peak grad:   14.7 mmHg MV Decel Time: 198 msec    TR Vmax:        192.00 cm/s MV E velocity: 73.10 cm/s                            SHUNTS                            Systemic VTI:  0.15 m  Systemic Diam: 1.80 cm Jodelle Red MD Electronically signed by Jodelle Red MD Signature Date/Time: 04/03/2023/12:04:37 PM    Final     Cardiac Studies  04/05/2023  Procedure:   DCCV   Indication:  Symptomatic atrial fibrillation/flutter   Procedure Note:  The patient signed informed consent.  They have had had therapeutic anticoagulation with rivaroxaban greater than 3 weeks.  Anesthesia was administered by Dr. Hyacinth Meeker.  Patient received 40 mg IV lidocaine and 60 mg IV propofol.Adequate airway was maintained throughout and vital followed per protocol.  They were cardioverted x 1 with 120J of biphasic synchronized energy.  She was intially junctional rhythm in the 20s, then sinus beat followed by 4-5 beats of SVT. Given that this was underlying sinus, and there were still rare junctional beats in the 20s, no further shocks performed.  There were no apparent complications.  The patient had normal neuro status and respiratory status post procedure with vitals stable as recorded elsewhere.     Follow up:  They will continue on current medical therapy and follow up with cardiology as scheduled.        Patient Profile     82 y.o. female  w history of SVT (ablation 2007), persistent atrial fibrillation and atrial flutter (ablation 2017, most recent DCCV 04/09/2022), pericardial stripping for constriction (2006), hypertension, liver disease, spasmodic dysphonia, renal mass here with symptomatic atrial flutter w RVR    Assessment & Plan    Recurrent atrial arrhythmias after DCCV for AFlutter, now with clear manifestations of tachy-brady sd. Symptomatic due to tachyarrhythmia on arrival, now mildly symptomatic with pauses (lying in bed). Pauses persist >1 hour from sedation w propofol. History of side effects w amio. May need a dual chamber pacemaker, even if she evntually has a redo ablation. Consult EP.    For questions or updates, please contact [facility] Please consult [email or web address] for contact info under        Signed, Thurmon Fair, MD  04/05/2023, 9:55 AM

## 2023-04-05 NOTE — Progress Notes (Signed)
Pt is concern about her HR that drop to 65s  and low BP 89/66. Scheduled Metoprolol wasn't given. She stated " I am just afraid that my  HR will go so low, now it was @ 60's" Cardizem was paused, VS monitored.  Resumed on Cardizem qtt as HR sustaining at 90-110s. Pt agreed. BP @100  systolic. Plan for cardioversion @ am. Plan of care ongoing.

## 2023-04-05 NOTE — Anesthesia Preprocedure Evaluation (Signed)
Anesthesia Evaluation  Patient identified by MRN, date of birth, ID band Patient awake    Reviewed: Allergy & Precautions, NPO status , Patient's Chart, lab work & pertinent test results  Airway Mallampati: I  TM Distance: >3 FB Neck ROM: Full    Dental  (+) Teeth Intact, Dental Advisory Given   Pulmonary neg pulmonary ROS   breath sounds clear to auscultation       Cardiovascular hypertension, Pt. on home beta blockers and Pt. on medications +CHF  + dysrhythmias Atrial Fibrillation  Rhythm:Regular Rate:Normal     Neuro/Psych negative neurological ROS     GI/Hepatic negative GI ROS, Neg liver ROS,,,  Endo/Other  negative endocrine ROS    Renal/GU negative Renal ROS     Musculoskeletal   Abdominal Normal abdominal exam  (+)   Peds  Hematology negative hematology ROS (+)   Anesthesia Other Findings   Reproductive/Obstetrics                             Anesthesia Physical Anesthesia Plan  ASA: 3  Anesthesia Plan: General   Post-op Pain Management:    Induction: Intravenous  PONV Risk Score and Plan: 3 and Treatment may vary due to age or medical condition  Airway Management Planned: Mask  Additional Equipment: None  Intra-op Plan:   Post-operative Plan:   Informed Consent: I have reviewed the patients History and Physical, chart, labs and discussed the procedure including the risks, benefits and alternatives for the proposed anesthesia with the patient or authorized representative who has indicated his/her understanding and acceptance.     Dental advisory given  Plan Discussed with: CRNA  Anesthesia Plan Comments:         Anesthesia Quick Evaluation

## 2023-04-05 NOTE — Progress Notes (Signed)
Pt in procedure during shift change for cardioversion.

## 2023-04-05 NOTE — Progress Notes (Signed)
Attempted to call [address] for report 2x  but not ready and bed not ready.

## 2023-04-05 NOTE — Progress Notes (Signed)
Report given to [facility] Rn who requested pt to be transferred after shift changed.

## 2023-04-05 NOTE — Anesthesia Postprocedure Evaluation (Signed)
Anesthesia Post Note  Patient: Yolanda Bell  Procedure(s) Performed: CARDIOVERSION     Patient location during evaluation: PACU Anesthesia Type: General Level of consciousness: awake and alert Pain management: pain level controlled Vital Signs Assessment: post-procedure vital signs reviewed and stable Respiratory status: spontaneous breathing, nonlabored ventilation and respiratory function stable Cardiovascular status: blood pressure returned to baseline and stable Postop Assessment: no apparent nausea or vomiting Anesthetic complications: no   No notable events documented.  Last Vitals:  Vitals:   04/05/23 0840 04/05/23 0850  BP: (!) 108/52 120/74  Pulse: 63 70  Resp: 17 12  Temp:  36.7 C  SpO2: 98% 98%    Last Pain:  Vitals:   04/05/23 0850  TempSrc:   PainSc: 0-No pain                 Lowella Curb

## 2023-04-05 NOTE — Progress Notes (Signed)
MEWS Progress Note  Patient Details Name: Yolanda Bell MRN: [id number] DOB: 02-04-1941 Today's Date: 04/05/2023   MEWS Flowsheet Documentation:  Assess: MEWS Score Temp: 98 F (36.7 C) BP: (!) 145/81 MAP (mmHg): 73 Pulse Rate: (!) 121 ECG Heart Rate: (!) 120 Resp: 12 Level of Consciousness: Alert SpO2: 98 % O2 Device: Room Air Assess: MEWS Score MEWS Temp: 0 MEWS Systolic: 0 MEWS Pulse: 2 MEWS RR: 1 MEWS LOC: 0 MEWS Score: 3 MEWS Score Color: Yellow Assess: SIRS CRITERIA SIRS Temperature : 0 SIRS Respirations : 0 SIRS Pulse: 1 SIRS WBC: 0 SIRS Score Sum : 1 SIRS Temperature : 0 SIRS Pulse: 1 SIRS Respirations : 0 SIRS WBC: 0 SIRS Score Sum : 1 Assess: if the MEWS score is Yellow or Red Were vital signs taken at a resting state?: Yes Focused Assessment: No change from prior assessment Does the patient meet 2 or more of the SIRS criteria?: No MEWS guidelines implemented : Yes, yellow Treat MEWS Interventions: Considered administering scheduled or prn medications/treatments as ordered Take Vital Signs Increase Vital Sign Frequency : Yellow: Q2hr x1, continue Q4hrs until patient remains green for 12hrs Escalate MEWS: Escalate: Yellow: Discuss with charge nurse and consider notifying provider and/or RRT Notify: Charge Nurse/RN Name of Charge Nurse/RN Notified: creshenda Provider Notification Provider Name/Title: dr. Royann Shivers Date Provider Notified: 04/05/23 Time Provider Notified: [phone] Method of Notification: Face-to-face Notification Reason: Other (Comment) (frequent change in rythm) Provider response: At bedside Date of Provider Response: 04/05/23 Time of Provider Response: 0859      Arvella Merles I 04/05/2023, 10:15 AM

## 2023-04-06 ENCOUNTER — Encounter (HOSPITAL_COMMUNITY): Payer: Self-pay | Admitting: Cardiology

## 2023-04-06 ENCOUNTER — Other Ambulatory Visit (HOSPITAL_COMMUNITY): Payer: Self-pay

## 2023-04-06 DIAGNOSIS — I480 Paroxysmal atrial fibrillation: Secondary | ICD-10-CM | POA: Diagnosis not present

## 2023-04-06 DIAGNOSIS — I4891 Unspecified atrial fibrillation: Secondary | ICD-10-CM | POA: Diagnosis not present

## 2023-04-06 LAB — BASIC METABOLIC PANEL
Anion gap: 5 (ref 5–15)
BUN: 9 mg/dL (ref 8–23)
CO2: 24 mmol/L (ref 22–32)
Calcium: 8 mg/dL — ABNORMAL LOW (ref 8.9–10.3)
Chloride: 105 mmol/L (ref 98–111)
Creatinine, Ser: 0.67 mg/dL (ref 0.44–1.00)
GFR, Estimated: >60 mL/min (ref >60)
Glucose, Bld: 89 mg/dL (ref 70–99)
Potassium: 4.1 mmol/L (ref 3.5–5.1)
Sodium: 134 mmol/L — ABNORMAL LOW (ref 135–145)

## 2023-04-06 LAB — MAGNESIUM: Magnesium: 2.4 mg/dL (ref 1.7–2.4)

## 2023-04-06 MED ORDER — SPIRONOLACTONE 12.5 MG HALF TABLET
12.5000 mg | ORAL_TABLET | Freq: Every day | ORAL | Status: DC
  Administered 2023-04-06 – 2023-04-08 (×3): 12.5 mg via ORAL
  Filled 2023-04-06 (×3): qty 1

## 2023-04-06 NOTE — Plan of Care (Signed)

## 2023-04-06 NOTE — Care Management Important Message (Signed)
Important Message  Patient Details  Name: Yolanda Bell MRN: [id number] Date of Birth: 14-Sep-1941   Medicare Important Message Given:  Yes     [name] 04/06/2023, 8:12 AM

## 2023-04-06 NOTE — Progress Notes (Signed)
Pharmacy: Dofetilide (Tikosyn) - Follow Up Assessment and Electrolyte Replacement  Pharmacy consulted to assist in monitoring and replacing electrolytes in this 82 y.o. female admitted on 04/01/2023 undergoing dofetilide initiation. First dofetilide dose: 04/05/23  Labs:    Component Value Date/Time   K 4.1 04/06/2023 0251   MG 2.4 04/06/2023 0251     Plan: Potassium: K >/= 4: No additional supplementation needed  Magnesium: Mg > 2: No additional supplementation needed   Fredonia Highland, PharmD, BCPS, [facility] Clinical Pharmacist [phone] Please check AMION for all [facility] Pharmacy numbers 04/06/2023

## 2023-04-06 NOTE — TOC Benefit Eligibility Note (Signed)
Pharmacy Patient Advocate Encounter  Insurance verification completed.    The patient is insured through [employer] Part D  Ran test claim for dofetilide (Tikosyn) 500 mcg and the current 30 day co-pay is $10.00.   This test claim was processed through [facility]- copay amounts may vary at other pharmacies due to pharmacy/plan contracts, or as the patient moves through the different stages of their insurance plan.    Yolanda Bell, CPHT Pharmacy Patient Advocate Specialist [facility] Patient Advocate Team Direct Number: [phone]  Fax: [phone]

## 2023-04-06 NOTE — Progress Notes (Addendum)
Rounding Note    Patient Name: Yolanda Bell Date of Encounter: 04/06/2023  [facility] Cardiologist: Kristeen Miss, MD   Subjective   Feels so much better, slept well last night, no more waves of weakness  Inpatient Medications    Scheduled Meds:  dofetilide  500 mcg Oral BID   metoprolol succinate  25 mg Oral BID   rivaroxaban  20 mg Oral Q supper   sodium chloride flush  3 mL Intravenous Q12H   Continuous Infusions:  sodium chloride     PRN Meds: sodium chloride, acetaminophen, ALPRAZolam, bacitracin, melatonin, polyethylene glycol, sodium chloride flush   Vital Signs    Vitals:   04/05/23 2205 04/05/23 2358 04/06/23 0432 04/06/23 0751  BP: (!) 133/59 (!) 122/56 (!) 102/54 121/72  Pulse: 70 60 68 66  Resp:  18 16 20   Temp:  97.6 F (36.4 C) 97.6 F (36.4 C) 97.7 F (36.5 C)  TempSrc:  Oral Oral Oral  SpO2: 99% 99% 99% 97%  Weight:      Height:        Intake/Output Summary (Last 24 hours) at 04/06/2023 0841 Last data filed at 04/05/2023 1600 Gross per 24 hour  Intake 240 ml  Output 600 ml  Net -360 ml      04/01/2023   11:47 PM 12/22/2022   11:19 AM 10/20/2022   11:03 AM  Last 3 Weights  Weight (lbs) 134 lb 7.7 oz 135 lb 6.4 oz 136 lb 3.2 oz  Weight (kg) 61 kg 61.417 kg 61.78 kg      Telemetry    SR/artifact, rates 60's, no bradycardia since on 6E - Personally Reviewed  ECG    [id number], manually measured QT 460/QTc - Personally Reviewed with Dr. Elberta Fortis  Physical Exam   GEN: No acute distress.   Neck: No JVD Cardiac: RRR, no murmurs, rubs, or gallops.  Respiratory: CTA b/l. GI: Soft, nontender, non-distended  MS: No edema; No deformity. Neuro: vocal tremor at baseline,  Nonfocal otherwise Psych: Normal affect   Labs    High Sensitivity Troponin:   Recent Labs  Lab 04/01/23 2352 04/02/23 0254  TROPONINIHS 18* 33*     Chemistry Recent Labs  Lab 04/03/23 0017 04/04/23 0015 04/05/23 0008 04/05/23 1634  04/06/23 0251  NA 133* 134* 134* 133* 134*  K 3.8 4.0 4.4 3.9 4.1  CL 103 103 104 105 105  CO2 23 24 25  21* 24  GLUCOSE 91 100* 95 72 89  BUN 14 14 12 9 9   CREATININE 0.70 0.87 0.67 0.72 0.67  CALCIUM 8.2* 8.2* 8.1* 7.7* 8.0*  MG 1.8 2.1  --  1.9 2.4  PROT 5.6* 5.8*  --   --   --   ALBUMIN 3.4* 3.5  --   --   --   AST 14* 16  --   --   --   ALT 10 12  --   --   --   ALKPHOS 37* 41  --   --   --   BILITOT 1.0 0.9  --   --   --   GFRNONAA >60 >60 >60 >60 >60  ANIONGAP 7 7 5 7 5     Lipids No results for input(s): "CHOL", "TRIG", "HDL", "LABVLDL", "LDLCALC", "CHOLHDL" in the last 168 hours.  Hematology Recent Labs  Lab 04/01/23 2352 04/03/23 0017 04/04/23 0015  WBC 9.3 5.7 7.1  RBC 4.24 3.59* 3.72*  HGB 13.3 11.2* 11.3*  HCT 39.4 32.4*  34.0*  MCV 92.9 90.3 91.4  MCH 31.4 31.2 30.4  MCHC 33.8 34.6 33.2  RDW 15.6* 15.7* 15.6*  PLT 301 204 187   Thyroid  Recent Labs  Lab 04/03/23 0017  TSH 3.096    BNPNo results for input(s): "BNP", "PROBNP" in the last 168 hours.  DDimer No results for input(s): "DDIMER" in the last 168 hours.   Radiology    EP STUDY  Result Date: 04/05/2023 See surgical note for result.   Cardiac Studies   04/03/23: TTE  1. Left ventricular ejection fraction, by estimation, is 40 to 45%. The  left ventricle has mildly decreased function. The left ventricle  demonstrates global hypokinesis. Left ventricular diastolic parameters are  indeterminate.   2. Right ventricular systolic function is mildly reduced. The right  ventricular size is normal. There is normal pulmonary artery systolic  pressure. The estimated right ventricular systolic pressure is 22.7 mmHg.   3. Left atrial size was mildly dilated.   4. The mitral valve is abnormal. Mild mitral valve regurgitation. No  evidence of mitral stenosis.   5. The aortic valve is tricuspid. There is mild calcification of the  aortic valve. There is mild thickening of the aortic valve. Aortic valve   regurgitation is not visualized. Aortic valve sclerosis is present, with  no evidence of aortic valve stenosis.   6. The inferior vena cava is dilated in size with >50% respiratory  variability, suggesting right atrial pressure of 8 mmHg.   Comparison(s): Changes from prior study are noted. EF decreased slightly  compared to prior.    06/23/2021 TTE IMPRESSIONS   1. Left ventricular ejection fraction, by estimation, is 50 to 55%. The  left ventricle has low normal function. The left ventricle has no regional  wall motion abnormalities. Left ventricular diastolic parameters were  normal.   2. Right ventricular systolic function is normal. The right ventricular  size is normal. There is normal pulmonary artery systolic pressure.   3. Left atrial size was moderately dilated.   4. The mitral valve is normal in structure. Trivial mitral valve  regurgitation. No evidence of mitral stenosis.   5. Tricuspid valve regurgitation is moderate.   6. The aortic valve is tricuspid. Aortic valve regurgitation is not  visualized. No aortic stenosis is present.   7. The inferior vena cava is normal in size with greater than 50%  respiratory variability, suggesting right atrial pressure of 3 mmHg.      08/20/2016: EPS/ablation CONCLUSIONS: 1. Sinus rhythm upon presentation.   2. Successful electrical isolation and anatomical encircling of all four pulmonary veins with radiofrequency current. 3. No inducible arrhythmias following ablation both on and off of dobutamine 4. No early apparent complications.  Patient Profile     82 y.o. female hx of constrictive pericarditis status post pericardial stripping in [facility] 2006, SVT post ablation by Dr. Graciela Husbands, hypertension, gout, atrial fibrillation/flutter (post PVI ablation 2017) admitted with AFlutter/RVR  Assessment & Plan    Paroxysmal Afib Atypical AFlutter A tach CHA2DS2Vasc is 4, on Xarelto S/p DCCV with recurrent Atrial arrhythmia  (ATach) and pauses.  Tikosyn load is in progress K+ 4.1 Mag 2.4 Creat 0.67 (stable) QTc acceptable  Rhythm has improved significantly Follow    For questions or updates, please contact [facility] Please consult [email or web address] for contact info under        Signed, Sheilah Pigeon, PA-C  04/06/2023, 8:41 AM    I have seen and examined this patient with  Francis Dowse.  Agree with above, note added to reflect my findings.  Thing well.  Patient is in normal rhythm.  Slept well overnight.  GEN: Well nourished, well developed, in no acute distress  HEENT: normal  Neck: no JVD, carotid bruits, or masses Cardiac: RRR; no murmurs, rubs, or gallops,no edema  Respiratory:  clear to auscultation bilaterally, normal work of breathing GI: soft, nontender, nondistended, + BS MS: no deformity or atrophy  Skin: warm and dry Neuro:  Strength and sensation are intact Psych: euthymic mood, full affect   Paroxysmal atrial fibrillation, typical and atypical atrial flutter: Currently on Xarelto.  Dofetilide load in progress.  Potassium magnesium acceptable.  Rhythm has improved significantly.  Yolanda Bell continue with current management.  If QT remains stable, likely discharge home on Friday.  Yolanda Bell M. Trey Gulbranson MD 04/06/2023 9:16 AM

## 2023-04-06 NOTE — Progress Notes (Signed)
Post dose EKG is reviewed Stable QTc OK to continue Tikosyn load Francis Dowse, PA-C

## 2023-04-06 NOTE — Progress Notes (Signed)
PROGRESS NOTE    Yolanda Bell  UXL:[id number] DOB: [birth year] DOA: 04/01/2023 PCP: Moshe Cipro, NP   Yolanda Bell is a 82 y.o. female with medical history significant of Paroxysmal Atrial Fibrillation on Xarelto, constrictive pericarditis with perciardial stripping at [facility], HTN, liver disease, and spasmodic dystonia. She has had multiple cardioversions and ablations in the past.  -Presented to [facility] ED for tachycardia and palpitations despite taking 3 doses of propranolol at home -Admitted, started on Cardizem gtt., cardiology consulting -6/25 status post TEE/cardioversion, back in a flutter/A-fib and bradycardia, pauses -EP consulted, started Tikosyn  Subjective: -Back from cardioversion, now with atrial flutter, atrial tachycardia, pauses  Assessment and Plan:  Atrial fibrillation/flutter with RVR Tachybradycardia syndrome -Continue Toprol and Xarelto -Cards consulting, recurrent arrhythmias post cardioversion 6/25 -Concern for tachybradycardia syndrome, EP consulted, started Tikosyn 6/25  Chronic systolic CHF -EF down to 40-45%, mildly reduced RV on echo, previously 50-55% -Likely secondary to A-fib RVR -Clinically euvolemic, restart Aldactone today  Hypertension -Stable continue metoprolol and Cardizem gtt., Aldactone resumed  History of constrictive pericarditis -Status post pericardial stripping at [facility] 2006, stable  Left renal lesion -Stable, needs repeat surveillance  Insect bite -Left inner thigh, site appears unremarkable at this time  DVT prophylaxis: Xarelto Code Status: Full code Family Communication: None present Disposition Plan: Home likely friday  Consultants: Cards, EP   Procedures:   Antimicrobials:    Objective: Vitals:   04/05/23 2205 04/05/23 2358 04/06/23 0432 04/06/23 0751  BP: (!) 133/59 (!) 122/56 (!) 102/54 121/72  Pulse: 70 60 68 66  Resp:  18 16 20   Temp:  97.6 F (36.4 C) 97.6 F (36.4 C) 97.7 F (36.5  C)  TempSrc:  Oral Oral Oral  SpO2: 99% 99% 99% 97%  Weight:      Height:        Intake/Output Summary (Last 24 hours) at 04/06/2023 0958 Last data filed at 04/05/2023 1600 Gross per 24 hour  Intake 240 ml  Output 600 ml  Net -360 ml   Filed Weights   04/01/23 2347  Weight: 61 kg    Examination:  Gen: Awake, Alert, Oriented X 3,  HEENT: no JVD Lungs: Good air movement bilaterally, CTAB CVS: S1S2/RRR Abd: soft, Non tender, non distended, BS present Extremities: No edema Skin: no new rashes on exposed skin  Psychiatry:  Mood & affect appropriate.     Data Reviewed:   CBC: Recent Labs  Lab 04/01/23 2352 04/03/23 0017 04/04/23 0015  WBC 9.3 5.7 7.1  HGB 13.3 11.2* 11.3*  HCT 39.4 32.4* 34.0*  MCV 92.9 90.3 91.4  PLT 301 204 187   Basic Metabolic Panel: Recent Labs  Lab 04/01/23 2352 04/03/23 0017 04/04/23 0015 04/05/23 0008 04/05/23 1634 04/06/23 0251  NA 135 133* 134* 134* 133* 134*  K 4.2 3.8 4.0 4.4 3.9 4.1  CL 98 103 103 104 105 105  CO2 24 23 24 25  21* 24  GLUCOSE 104* 91 100* 95 72 89  BUN 11 14 14 12 9 9   CREATININE 1.19* 0.70 0.87 0.67 0.72 0.67  CALCIUM 9.4 8.2* 8.2* 8.1* 7.7* 8.0*  MG 1.9 1.8 2.1  --  1.9 2.4   GFR: Estimated Creatinine Clearance: 48.3 mL/min (by C-G formula based on SCr of 0.67 mg/dL). Liver Function Tests: Recent Labs  Lab 04/03/23 0017 04/04/23 0015  AST 14* 16  ALT 10 12  ALKPHOS 37* 41  BILITOT 1.0 0.9  PROT 5.6* 5.8*  ALBUMIN 3.4* 3.5  No results for input(s): "LIPASE", "AMYLASE" in the last 168 hours. No results for input(s): "AMMONIA" in the last 168 hours. Coagulation Profile: Recent Labs  Lab 04/01/23 2352  INR 2.9*   Cardiac Enzymes: No results for input(s): "CKTOTAL", "CKMB", "CKMBINDEX", "TROPONINI" in the last 168 hours. BNP (last 3 results) No results for input(s): "PROBNP" in the last 8760 hours. HbA1C: No results for input(s): "HGBA1C" in the last 72 hours. CBG: No results for  input(s): "GLUCAP" in the last 168 hours. Lipid Profile: No results for input(s): "CHOL", "HDL", "LDLCALC", "TRIG", "CHOLHDL", "LDLDIRECT" in the last 72 hours. Thyroid Function Tests: No results for input(s): "TSH", "T4TOTAL", "FREET4", "T3FREE", "THYROIDAB" in the last 72 hours.  Anemia Panel: No results for input(s): "VITAMINB12", "FOLATE", "FERRITIN", "TIBC", "IRON", "RETICCTPCT" in the last 72 hours. Urine analysis:    Component Value Date/Time   COLORURINE YELLOW 06/01/2022 0243   APPEARANCEUR CLEAR 06/01/2022 0243   LABSPEC 1.044 (H) 06/01/2022 0243   PHURINE 5.0 06/01/2022 0243   GLUCOSEU NEGATIVE 06/01/2022 0243   HGBUR NEGATIVE 06/01/2022 0243   BILIRUBINUR NEGATIVE 06/01/2022 0243   KETONESUR 5 (A) 06/01/2022 0243   PROTEINUR NEGATIVE 06/01/2022 0243   NITRITE NEGATIVE 06/01/2022 0243   LEUKOCYTESUR NEGATIVE 06/01/2022 0243   Sepsis Labs: @LABRCNTIP (procalcitonin:4,lacticidven:4)  )No results found for this or any previous visit (from the past 240 hour(s)).   Radiology Studies: EP STUDY  Result Date: 04/05/2023 See surgical note for result.    Scheduled Meds:  dofetilide  500 mcg Oral BID   metoprolol succinate  25 mg Oral BID   rivaroxaban  20 mg Oral Q supper   sodium chloride flush  3 mL Intravenous Q12H   Continuous Infusions:  sodium chloride       LOS: 4 days    Time spent:    Zannie Cove, MD [facility]   04/06/2023, 9:58 AM

## 2023-04-06 NOTE — Progress Notes (Signed)
First dose tikosyn given last night. A couple of 2 second pauses noted and heart rate decreased to the 50s. Paged physician before giving metoprolol. PM dose of metoprolol held per physician. EKG indicates sinus rhythm.

## 2023-04-07 DIAGNOSIS — I4891 Unspecified atrial fibrillation: Secondary | ICD-10-CM | POA: Diagnosis not present

## 2023-04-07 DIAGNOSIS — I480 Paroxysmal atrial fibrillation: Secondary | ICD-10-CM | POA: Diagnosis not present

## 2023-04-07 LAB — BASIC METABOLIC PANEL
Anion gap: 5 (ref 5–15)
BUN: 10 mg/dL (ref 8–23)
CO2: 23 mmol/L (ref 22–32)
Calcium: 7.9 mg/dL — ABNORMAL LOW (ref 8.9–10.3)
Chloride: 104 mmol/L (ref 98–111)
Creatinine, Ser: 0.59 mg/dL (ref 0.44–1.00)
GFR, Estimated: >60 mL/min (ref >60)
Glucose, Bld: 83 mg/dL (ref 70–99)
Potassium: 3.9 mmol/L (ref 3.5–5.1)
Sodium: 132 mmol/L — ABNORMAL LOW (ref 135–145)

## 2023-04-07 LAB — MAGNESIUM: Magnesium: 2.1 mg/dL (ref 1.7–2.4)

## 2023-04-07 MED ORDER — POTASSIUM CHLORIDE CRYS ER 20 MEQ PO TBCR
40.0000 meq | EXTENDED_RELEASE_TABLET | Freq: Once | ORAL | Status: AC
  Administered 2023-04-07: 40 meq via ORAL
  Filled 2023-04-07: qty 2

## 2023-04-07 MED ORDER — DOFETILIDE 250 MCG PO CAPS
250.0000 ug | ORAL_CAPSULE | Freq: Two times a day (BID) | ORAL | Status: DC
  Administered 2023-04-07 – 2023-04-08 (×2): 250 ug via ORAL
  Filled 2023-04-07 (×2): qty 1

## 2023-04-07 NOTE — Progress Notes (Signed)
Post dose EKG is reviewed with Dr. Elberta Fortis QTc has lengthened some Reduce dose for tonight to  Francis Dowse, PA-C

## 2023-04-07 NOTE — Plan of Care (Signed)
  Problem: Education: Goal: Knowledge of General Education information will improve Description: Including pain rating scale, medication(s)/side effects and non-pharmacologic comfort measures Outcome: Progressing   Problem: Health Behavior/Discharge Planning: Goal: Ability to manage health-related needs will improve Outcome: Progressing   Problem: Clinical Measurements: Goal: Respiratory complications will improve Outcome: Progressing   Problem: Activity: Goal: Risk for activity intolerance will decrease Outcome: Progressing   Problem: Nutrition: Goal: Adequate nutrition will be maintained Outcome: Progressing   Problem: Elimination: Goal: Will not experience complications related to bowel motility Outcome: Progressing Goal: Will not experience complications related to urinary retention Outcome: Progressing   Problem: Skin Integrity: Goal: Risk for impaired skin integrity will decrease Outcome: Progressing   Problem: Education: Goal: Knowledge of disease or condition will improve Outcome: Progressing   Problem: Clinical Measurements: Goal: Cardiovascular complication will be avoided Outcome: Not Progressing   Problem: Coping: Goal: Level of anxiety will decrease Outcome: Not Progressing

## 2023-04-07 NOTE — Progress Notes (Addendum)
Rounding Note    Patient Name: Yolanda Bell Date of Encounter: 04/07/2023  [facility] Cardiologist: Kristeen Miss, MD   Subjective   Again, feels well  Inpatient Medications    Scheduled Meds:  dofetilide  500 mcg Oral BID   metoprolol succinate  25 mg Oral BID   potassium chloride  40 mEq Oral Once   rivaroxaban  20 mg Oral Q supper   sodium chloride flush  3 mL Intravenous Q12H   spironolactone  12.5 mg Oral Daily   Continuous Infusions:  sodium chloride     PRN Meds: sodium chloride, acetaminophen, ALPRAZolam, bacitracin, melatonin, polyethylene glycol, sodium chloride flush   Vital Signs    Vitals:   04/06/23 2026 04/07/23 0018 04/07/23 0320 04/07/23 0746  BP: (!) 129/51 129/63 (!) 123/55 124/64  Pulse: 64 61 61 66  Resp: 18 14 14 14   Temp: 97.8 F (36.6 C) 97.6 F (36.4 C) 97.8 F (36.6 C) 98 F (36.7 C)  TempSrc: Oral Oral Oral Oral  SpO2:  92% 95% 100%  Weight:      Height:       No intake or output data in the 24 hours ending 04/07/23 0916     04/01/2023   11:47 PM 12/22/2022   11:19 AM 10/20/2022   11:03 AM  Last 3 Weights  Weight (lbs) 134 lb 7.7 oz 135 lb 6.4 oz 136 lb 3.2 oz  Weight (kg) 61 kg 61.417 kg 61.78 kg      Telemetry    SR/artifact, rates 70's-80's, no bradycardia since on 6E - Personally Reviewed  ECG    SR 62, - Personally Reviewed with Dr. Elberta Fortis, remains acceptable  Physical Exam   Exam remains unchanged, seen/examined by Dr. Elberta Fortis GEN: No acute distress.   Neck: No JVD Cardiac: RRR, no murmurs, rubs, or gallops.  Respiratory: CTA b/l. GI: Soft, nontender, non-distended  MS: No edema; No deformity. Neuro: vocal tremor at baseline,  Nonfocal otherwise Psych: Normal affect   Labs    High Sensitivity Troponin:   Recent Labs  Lab 04/01/23 2352 04/02/23 0254  TROPONINIHS 18* 33*     Chemistry Recent Labs  Lab 04/03/23 0017 04/04/23 0015 04/05/23 0008 04/05/23 1634 04/06/23 0251  04/07/23 0308  NA 133* 134*   < > 133* 134* 132*  K 3.8 4.0   < > 3.9 4.1 3.9  CL 103 103   < > 105 105 104  CO2 23 24   < > 21* 24 23  GLUCOSE 91 100*   < > 72 89 83  BUN 14 14   < > 9 9 10   CREATININE 0.70 0.87   < > 0.72 0.67 0.59  CALCIUM 8.2* 8.2*   < > 7.7* 8.0* 7.9*  MG 1.8 2.1  --  1.9 2.4 2.1  PROT 5.6* 5.8*  --   --   --   --   ALBUMIN 3.4* 3.5  --   --   --   --   AST 14* 16  --   --   --   --   ALT 10 12  --   --   --   --   ALKPHOS 37* 41  --   --   --   --   BILITOT 1.0 0.9  --   --   --   --   GFRNONAA >60 >60   < > >60 >60 >60  ANIONGAP 7 7   < >  7 5 5    < > = values in this interval not displayed.    Lipids No results for input(s): "CHOL", "TRIG", "HDL", "LABVLDL", "LDLCALC", "CHOLHDL" in the last 168 hours.  Hematology Recent Labs  Lab 04/01/23 2352 04/03/23 0017 04/04/23 0015  WBC 9.3 5.7 7.1  RBC 4.24 3.59* 3.72*  HGB 13.3 11.2* 11.3*  HCT 39.4 32.4* 34.0*  MCV 92.9 90.3 91.4  MCH 31.4 31.2 30.4  MCHC 33.8 34.6 33.2  RDW 15.6* 15.7* 15.6*  PLT 301 204 187   Thyroid  Recent Labs  Lab 04/03/23 0017  TSH 3.096    BNPNo results for input(s): "BNP", "PROBNP" in the last 168 hours.  DDimer No results for input(s): "DDIMER" in the last 168 hours.   Radiology    No results found.  Cardiac Studies   04/03/23: TTE  1. Left ventricular ejection fraction, by estimation, is 40 to 45%. The  left ventricle has mildly decreased function. The left ventricle  demonstrates global hypokinesis. Left ventricular diastolic parameters are  indeterminate.   2. Right ventricular systolic function is mildly reduced. The right  ventricular size is normal. There is normal pulmonary artery systolic  pressure. The estimated right ventricular systolic pressure is 22.7 mmHg.   3. Left atrial size was mildly dilated.   4. The mitral valve is abnormal. Mild mitral valve regurgitation. No  evidence of mitral stenosis.   5. The aortic valve is tricuspid. There is mild  calcification of the  aortic valve. There is mild thickening of the aortic valve. Aortic valve  regurgitation is not visualized. Aortic valve sclerosis is present, with  no evidence of aortic valve stenosis.   6. The inferior vena cava is dilated in size with >50% respiratory  variability, suggesting right atrial pressure of 8 mmHg.   Comparison(s): Changes from prior study are noted. EF decreased slightly  compared to prior.    06/23/2021 TTE IMPRESSIONS   1. Left ventricular ejection fraction, by estimation, is 50 to 55%. The  left ventricle has low normal function. The left ventricle has no regional  wall motion abnormalities. Left ventricular diastolic parameters were  normal.   2. Right ventricular systolic function is normal. The right ventricular  size is normal. There is normal pulmonary artery systolic pressure.   3. Left atrial size was moderately dilated.   4. The mitral valve is normal in structure. Trivial mitral valve  regurgitation. No evidence of mitral stenosis.   5. Tricuspid valve regurgitation is moderate.   6. The aortic valve is tricuspid. Aortic valve regurgitation is not  visualized. No aortic stenosis is present.   7. The inferior vena cava is normal in size with greater than 50%  respiratory variability, suggesting right atrial pressure of 3 mmHg.      08/20/2016: EPS/ablation CONCLUSIONS: 1. Sinus rhythm upon presentation.   2. Successful electrical isolation and anatomical encircling of all four pulmonary veins with radiofrequency current. 3. No inducible arrhythmias following ablation both on and off of dobutamine 4. No early apparent complications.  Patient Profile     82 y.o. female hx of constrictive pericarditis status post pericardial stripping in [facility] 2006, SVT post ablation by Dr. Graciela Husbands, hypertension, gout, atrial fibrillation/flutter (post PVI ablation 2017) admitted with AFlutter/RVR  Assessment & Plan    Paroxysmal Afib Atypical  AFlutter A tach CHA2DS2Vasc is 4, on Xarelto S/p DCCV with recurrent Atrial arrhythmia (ATach) and pauses.  Tikosyn load is in progress K+ 3.9 Mag 2.1 Creat 0.59 (  stable) QTc acceptable, though slightly longer Follow closely  Rhythm remains significantly improved, no brady Follow   Anticipate discharge tomorrow   For questions or updates, please contact [facility] Please consult [email or web address] for contact info under        Signed, Sheilah Pigeon, PA-C  04/07/2023, 9:16 AM    I have seen and examined this patient with Francis Dowse.  Agree with above, note added to reflect my findings.  Remains in sinus rhythm.  Feeling well without complaint.  GEN: Well nourished, well developed, in no acute distress  HEENT: normal  Neck: no JVD, carotid bruits, or masses Cardiac: RRR; no murmurs, rubs, or gallops,no edema  Respiratory:  clear to auscultation bilaterally, normal work of breathing GI: soft, nontender, nondistended, + BS MS: no deformity or atrophy  Skin: warm and dry Neuro:  Strength and sensation are intact Psych: euthymic mood, full affect   Persistent atrial fibrillation/atypical atrial flutter/atrial tachycardia: Currently on Xarelto.  Dofetilide load in progress.  QTc is somewhat lengthened.  Sion Thane continue with current dose, but low threshold to reduce the dose after ECG today.  Itzayanna Kaster M. Onya Eutsler MD 04/07/2023 10:05 AM

## 2023-04-07 NOTE — Progress Notes (Signed)
Mobility Specialist Progress Note:   04/07/23 1114  Mobility  Activity Ambulated with assistance in hallway  Level of Assistance Contact guard assist, steadying assist  Assistive Device None  Distance Ambulated (ft) 500 ft  Activity Response Tolerated well  Mobility Referral Yes  $Mobility charge 1 Mobility  Mobility Specialist Start Time (ACUTE ONLY) 0950  Mobility Specialist Stop Time (ACUTE ONLY) 1010  Mobility Specialist Time Calculation (min) (ACUTE ONLY) 20 min    Pre Mobility: 93 HR  During Mobility: 117 HR   Post Mobility: 95 HR   Pt received standing in room, agreeable to mobility after using bathroom. Pt denied any feelings of pain or SOB during ambulation. VSS throughout. Pt left at EOB with call bell in hand and all needs met.    Leory Plowman  Mobility Specialist Please contact via [employer] office at [phone]

## 2023-04-07 NOTE — Progress Notes (Signed)
PROGRESS NOTE    Yolanda Bell  PPI:[id number] DOB: 1941-03-31 DOA: 04/01/2023 PCP: Yolanda Cipro, NP   As per H&P done by Dr. Jomarie Longs: "Yolanda Bell is a 82 y.o. female with medical history significant of Paroxysmal Atrial Fibrillation on Xarelto, constrictive pericarditis with perciardial stripping at [facility], HTN, liver disease, and spasmodic dystonia. She has had multiple cardioversions and ablations in the past.  -Presented to [facility] ED for tachycardia and palpitations despite taking 3 doses of propranolol at home -Admitted, started on Cardizem gtt., cardiology consulting -6/25 status post TEE/cardioversion, back in a flutter/A-fib and bradycardia, pauses -EP consulted, started Tikosyn".  04/07/2023: Patient seen.  No new complaints.  Cardiology input is appreciated.  Tikosyn 500 mcg planned for today.  QTc Annable is being monitored.  Assessment and Plan:  Atrial fibrillation/flutter with RVR Tachybradycardia syndrome -Continue Toprol and Xarelto -Cards consulting, recurrent arrhythmias post cardioversion 6/25 -Concern for tachybradycardia syndrome, EP consulted, started Tikosyn 6/25 04/07/2023: Cardiology is directing management.  As per collateral information, likely discharge home tomorrow.  Chronic systolic CHF -EF down to 40-45%, mildly reduced RV on echo, previously 50-55% -Likely secondary to A-fib RVR -Clinically euvolemic, restart Aldactone today 04/07/2023: Compensated.  Hypertension -Stable continue metoprolol and Cardizem gtt., Aldactone resumed  History of constrictive pericarditis -Status post pericardial stripping at [facility] 2006, stable  Left renal lesion -Stable, needs repeat surveillance  Insect bite -Left inner thigh, site appears unremarkable at this time  DVT prophylaxis: Xarelto Code Status: Full code Family Communication: None present Disposition Plan: Home likely friday  Consultants: Cards, EP   Procedures:   Antimicrobials:     Objective: Vitals:   04/07/23 0320 04/07/23 0746 04/07/23 0928 04/07/23 1253  BP: (!) 123/55 124/64 (!) 125/57 (!) 135/57  Pulse: 61 66  70  Resp: 14 14 17 17   Temp: 97.8 F (36.6 C) 98 F (36.7 C)  97.7 F (36.5 C)  TempSrc: Oral Oral  Oral  SpO2: 95% 100%  99%  Weight:      Height:       No intake or output data in the 24 hours ending 04/07/23 1605  Filed Weights   04/01/23 2347  Weight: 61 kg    Examination: General condition: Not in any distress.  Awake and alert. HEENT: No pallor.  No jaundice. Neck: Supple. CVS: S1-S2. Abdomen: Obese, soft and nontender. Neuro: Awake and alert. Lungs: Clear to auscultation. Extremities: No notable edema.    Data Reviewed:   CBC: Recent Labs  Lab 04/01/23 2352 04/03/23 0017 04/04/23 0015  WBC 9.3 5.7 7.1  HGB 13.3 11.2* 11.3*  HCT 39.4 32.4* 34.0*  MCV 92.9 90.3 91.4  PLT 301 204 187    Basic Metabolic Panel: Recent Labs  Lab 04/03/23 0017 04/04/23 0015 04/05/23 0008 04/05/23 1634 04/06/23 0251 04/07/23 0308  NA 133* 134* 134* 133* 134* 132*  K 3.8 4.0 4.4 3.9 4.1 3.9  CL 103 103 104 105 105 104  CO2 23 24 25  21* 24 23  GLUCOSE 91 100* 95 72 89 83  BUN 14 14 12 9 9 10   CREATININE 0.70 0.87 0.67 0.72 0.67 0.59  CALCIUM 8.2* 8.2* 8.1* 7.7* 8.0* 7.9*  MG 1.8 2.1  --  1.9 2.4 2.1    GFR: Estimated Creatinine Clearance: 48.3 mL/min (by C-G formula based on SCr of 0.59 mg/dL). Liver Function Tests: Recent Labs  Lab 04/03/23 0017 04/04/23 0015  AST 14* 16  ALT 10 12  ALKPHOS 37* 41  BILITOT 1.0 0.9  PROT 5.6* 5.8*  ALBUMIN 3.4* 3.5    No results for input(s): "LIPASE", "AMYLASE" in the last 168 hours. No results for input(s): "AMMONIA" in the last 168 hours. Coagulation Profile: Recent Labs  Lab 04/01/23 2352  INR 2.9*    Cardiac Enzymes: No results for input(s): "CKTOTAL", "CKMB", "CKMBINDEX", "TROPONINI" in the last 168 hours. BNP (last 3 results) No results for input(s): "PROBNP"  in the last 8760 hours. HbA1C: No results for input(s): "HGBA1C" in the last 72 hours. CBG: No results for input(s): "GLUCAP" in the last 168 hours. Lipid Profile: No results for input(s): "CHOL", "HDL", "LDLCALC", "TRIG", "CHOLHDL", "LDLDIRECT" in the last 72 hours. Thyroid Function Tests: No results for input(s): "TSH", "T4TOTAL", "FREET4", "T3FREE", "THYROIDAB" in the last 72 hours.  Anemia Panel: No results for input(s): "VITAMINB12", "FOLATE", "FERRITIN", "TIBC", "IRON", "RETICCTPCT" in the last 72 hours. Urine analysis:    Component Value Date/Time   COLORURINE YELLOW 06/01/2022 0243   APPEARANCEUR CLEAR 06/01/2022 0243   LABSPEC 1.044 (H) 06/01/2022 0243   PHURINE 5.0 06/01/2022 0243   GLUCOSEU NEGATIVE 06/01/2022 0243   HGBUR NEGATIVE 06/01/2022 0243   BILIRUBINUR NEGATIVE 06/01/2022 0243   KETONESUR 5 (A) 06/01/2022 0243   PROTEINUR NEGATIVE 06/01/2022 0243   NITRITE NEGATIVE 06/01/2022 0243   LEUKOCYTESUR NEGATIVE 06/01/2022 0243   Sepsis Labs: @LABRCNTIP (procalcitonin:4,lacticidven:4)  )No results found for this or any previous visit (from the past 240 hour(s)).   Radiology Studies: No results found.   Scheduled Meds:  dofetilide  250 mcg Oral BID   metoprolol succinate  25 mg Oral BID   rivaroxaban  20 mg Oral Q supper   sodium chloride flush  3 mL Intravenous Q12H   spironolactone  12.5 mg Oral Daily   Continuous Infusions:  sodium chloride       LOS: 5 days    Time spent:    Berton Mount, MD [facility]   04/07/2023, 4:05 PM

## 2023-04-07 NOTE — Progress Notes (Signed)
Pharmacy: Dofetilide (Tikosyn) - Follow Up Assessment and Electrolyte Replacement  Pharmacy consulted to assist in monitoring and replacing electrolytes in this 82 y.o. female admitted on 04/01/2023 undergoing dofetilide initiation. First dofetilide dose: 04/05/23  Labs:    Component Value Date/Time   K 3.9 04/07/2023 0308   MG 2.1 04/07/2023 0308     Plan: Potassium: K 3.8-3.9:  Give KCl 40 mEq po x1   Magnesium: Mg > 2: No additional supplementation needed  Thank you for allowing pharmacy to participate in this patient's care,  Sherron Monday, PharmD, BCCCP Clinical Pharmacist  Phone: [phone] 04/07/2023 7:20 AM  Please check AMION for all [facility] phone numbers After 10:00 PM, call Main Pharmacy [phone]

## 2023-04-07 NOTE — Care Management (Signed)
3154 04-07-23 Case Manager spoke with the patient regarding co pay cost. Patient is agreeable to cost and would like to have the initial Rx filled via [facility] and the Rx refills 90 day supply escribed to [facility] No further needs identified at this time.

## 2023-04-08 ENCOUNTER — Other Ambulatory Visit (HOSPITAL_COMMUNITY): Payer: Self-pay

## 2023-04-08 DIAGNOSIS — I4891 Unspecified atrial fibrillation: Secondary | ICD-10-CM | POA: Diagnosis not present

## 2023-04-08 LAB — BASIC METABOLIC PANEL
Anion gap: 6 (ref 5–15)
BUN: 9 mg/dL (ref 8–23)
CO2: 25 mmol/L (ref 22–32)
Calcium: 8.6 mg/dL — ABNORMAL LOW (ref 8.9–10.3)
Chloride: 104 mmol/L (ref 98–111)
Creatinine, Ser: 0.65 mg/dL (ref 0.44–1.00)
GFR, Estimated: >60 mL/min (ref >60)
Glucose, Bld: 84 mg/dL (ref 70–99)
Potassium: 4.3 mmol/L (ref 3.5–5.1)
Sodium: 135 mmol/L (ref 135–145)

## 2023-04-08 LAB — MAGNESIUM: Magnesium: 2 mg/dL (ref 1.7–2.4)

## 2023-04-08 LAB — TROPONIN I (HIGH SENSITIVITY): Troponin I (High Sensitivity): 3 ng/L (ref <18)

## 2023-04-08 MED ORDER — MAGNESIUM SULFATE 2 GM/50ML IV SOLN
2.0000 g | Freq: Once | INTRAVENOUS | Status: AC
  Administered 2023-04-08: 2 g via INTRAVENOUS
  Filled 2023-04-08: qty 50

## 2023-04-08 MED ORDER — DOFETILIDE 250 MCG PO CAPS
250.0000 ug | ORAL_CAPSULE | Freq: Two times a day (BID) | ORAL | 0 refills | Status: DC
  Filled 2023-04-08: qty 60, 30d supply, fill #0

## 2023-04-08 MED ORDER — BACITRACIN ZINC 500 UNIT/GM EX OINT
TOPICAL_OINTMENT | CUTANEOUS | 0 refills | Status: DC | PRN
  Filled 2023-04-08: qty 56.8, 10d supply, fill #0

## 2023-04-08 NOTE — Progress Notes (Addendum)
Post dose EKG is reviewed This morning in d/w with Dr. Elberta Fortis she mentioned last night b/l upper arm tingling and unusually elevated BP's, vague flutter/vibration of her heart that was brief Last PM's EKG was reviewed, QTc stable with out obvious ischemic looking changes. Her post does EKG now with more pronounced T invV1-3, no ST changes, stable QTc. Pt reports no CP, SOB, upper arm tingling/numbness remains but improved LUE/shoulder R side is resolved. Telemetry stable SB/Sinus arrhythmia 50's-60's Reviewed with Dr. Elberta Fortis, plan HS Trop if negative in light of her coronary anatomy in 2017, would be OK to discharge.  HS Trop ordered stat.  Francis Dowse, PA-C

## 2023-04-08 NOTE — Progress Notes (Signed)
Pharmacy: Dofetilide (Tikosyn) - Follow Up Assessment and Electrolyte Replacement  Pharmacy consulted to assist in monitoring and replacing electrolytes in this 82 y.o. female admitted on 04/01/2023 undergoing dofetilide initiation. First dofetilide dose: 04/05/23  Labs:    Component Value Date/Time   K 4.3 04/08/2023 0231   MG 2.0 04/08/2023 0231     Plan: Potassium: K >/= 4: No additional supplementation needed  Magnesium: Mg 1.8-2: Give Mg 2 gm IV x1   As patient has required on average 20 mEq of potassium replacement every day, recommend discharging patient with prescription for:  Potassium chloride 20 mEq  daily  Thank you for allowing pharmacy to participate in this patient's care,  Sherron Monday, PharmD, BCCCP Clinical Pharmacist  Phone: [phone] 04/08/2023 7:20 AM  Please check AMION for all [facility] Pharmacy phone numbers After 10:00 PM, call Main Pharmacy [phone]

## 2023-04-08 NOTE — Progress Notes (Addendum)
Rounding Note    Patient Name: Yolanda Bell Date of Encounter: 04/07/2023  [facility] Cardiologist: Kristeen Miss, MD   Subjective   Again, feels well  Inpatient Medications    Scheduled Meds:  dofetilide  500 mcg Oral BID   metoprolol succinate  25 mg Oral BID   potassium chloride  40 mEq Oral Once   rivaroxaban  20 mg Oral Q supper   sodium chloride flush  3 mL Intravenous Q12H   spironolactone  12.5 mg Oral Daily   Continuous Infusions:  sodium chloride     PRN Meds: sodium chloride, acetaminophen, ALPRAZolam, bacitracin, melatonin, polyethylene glycol, sodium chloride flush   Vital Signs    Vitals:   04/06/23 2026 04/07/23 0018 04/07/23 0320 04/07/23 0746  BP: (!) 129/51 129/63 (!) 123/55 124/64  Pulse: 64 61 61 66  Resp: 18 14 14 14   Temp: 97.8 F (36.6 C) 97.6 F (36.4 C) 97.8 F (36.6 C) 98 F (36.7 C)  TempSrc: Oral Oral Oral Oral  SpO2:  92% 95% 100%  Weight:      Height:       No intake or output data in the 24 hours ending 04/07/23 0916     04/01/2023   11:47 PM 12/22/2022   11:19 AM 10/20/2022   11:03 AM  Last 3 Weights  Weight (lbs) 134 lb 7.7 oz 135 lb 6.4 oz 136 lb 3.2 oz  Weight (kg) 61 kg 61.417 kg 61.78 kg      Telemetry    SR/artifact,  sinus arrhythmia rates 50's-80's, no significant bradycardia since on 6E - Personally Reviewed  ECG    SR 62, , T inversions V1-3- Personally Reviewed with Dr. Elberta Fortis  Physical Exam    GEN: No acute distress.   Neck: No JVD Cardiac: RRR, no murmurs, rubs, or gallops.  Respiratory: CTA b/l. GI: Soft, nontender, non-distended  MS: No edema; No deformity. Neuro: vocal tremor at baseline,  Nonfocal otherwise Psych: Normal affect   Labs    High Sensitivity Troponin:   Recent Labs  Lab 04/01/23 2352 04/02/23 0254  TROPONINIHS 18* 33*     Chemistry Recent Labs  Lab 04/03/23 0017 04/04/23 0015 04/05/23 0008 04/05/23 1634 04/06/23 0251 04/07/23 0308  NA 133*  134*   < > 133* 134* 132*  K 3.8 4.0   < > 3.9 4.1 3.9  CL 103 103   < > 105 105 104  CO2 23 24   < > 21* 24 23  GLUCOSE 91 100*   < > 72 89 83  BUN 14 14   < > 9 9 10   CREATININE 0.70 0.87   < > 0.72 0.67 0.59  CALCIUM 8.2* 8.2*   < > 7.7* 8.0* 7.9*  MG 1.8 2.1  --  1.9 2.4 2.1  PROT 5.6* 5.8*  --   --   --   --   ALBUMIN 3.4* 3.5  --   --   --   --   AST 14* 16  --   --   --   --   ALT 10 12  --   --   --   --   ALKPHOS 37* 41  --   --   --   --   BILITOT 1.0 0.9  --   --   --   --   GFRNONAA >60 >60   < > >60 >60 >60  ANIONGAP 7 7   < >  7 5 5    < > = values in this interval not displayed.    Lipids No results for input(s): "CHOL", "TRIG", "HDL", "LABVLDL", "LDLCALC", "CHOLHDL" in the last 168 hours.  Hematology Recent Labs  Lab 04/01/23 2352 04/03/23 0017 04/04/23 0015  WBC 9.3 5.7 7.1  RBC 4.24 3.59* 3.72*  HGB 13.3 11.2* 11.3*  HCT 39.4 32.4* 34.0*  MCV 92.9 90.3 91.4  MCH 31.4 31.2 30.4  MCHC 33.8 34.6 33.2  RDW 15.6* 15.7* 15.6*  PLT 301 204 187   Thyroid  Recent Labs  Lab 04/03/23 0017  TSH 3.096    BNPNo results for input(s): "BNP", "PROBNP" in the last 168 hours.  DDimer No results for input(s): "DDIMER" in the last 168 hours.   Radiology    No results found.  Cardiac Studies   04/03/23: TTE  1. Left ventricular ejection fraction, by estimation, is 40 to 45%. The  left ventricle has mildly decreased function. The left ventricle  demonstrates global hypokinesis. Left ventricular diastolic parameters are  indeterminate.   2. Right ventricular systolic function is mildly reduced. The right  ventricular size is normal. There is normal pulmonary artery systolic  pressure. The estimated right ventricular systolic pressure is 22.7 mmHg.   3. Left atrial size was mildly dilated.   4. The mitral valve is abnormal. Mild mitral valve regurgitation. No  evidence of mitral stenosis.   5. The aortic valve is tricuspid. There is mild calcification of the   aortic valve. There is mild thickening of the aortic valve. Aortic valve  regurgitation is not visualized. Aortic valve sclerosis is present, with  no evidence of aortic valve stenosis.   6. The inferior vena cava is dilated in size with >50% respiratory  variability, suggesting right atrial pressure of 8 mmHg.   Comparison(s): Changes from prior study are noted. EF decreased slightly  compared to prior.    06/23/2021 TTE IMPRESSIONS   1. Left ventricular ejection fraction, by estimation, is 50 to 55%. The  left ventricle has low normal function. The left ventricle has no regional  wall motion abnormalities. Left ventricular diastolic parameters were  normal.   2. Right ventricular systolic function is normal. The right ventricular  size is normal. There is normal pulmonary artery systolic pressure.   3. Left atrial size was moderately dilated.   4. The mitral valve is normal in structure. Trivial mitral valve  regurgitation. No evidence of mitral stenosis.   5. Tricuspid valve regurgitation is moderate.   6. The aortic valve is tricuspid. Aortic valve regurgitation is not  visualized. No aortic stenosis is present.   7. The inferior vena cava is normal in size with greater than 50%  respiratory variability, suggesting right atrial pressure of 3 mmHg.      08/20/2016: EPS/ablation CONCLUSIONS: 1. Sinus rhythm upon presentation.   2. Successful electrical isolation and anatomical encircling of all four pulmonary veins with radiofrequency current. 3. No inducible arrhythmias following ablation both on and off of dobutamine 4. No early apparent complications.  Patient Profile     82 y.o. female hx of constrictive pericarditis status post pericardial stripping in [facility] 2006, SVT post ablation by Dr. Graciela Husbands, hypertension, gout, atrial fibrillation/flutter (post PVI ablation 2017) admitted with AFlutter/RVR  Assessment & Plan    Paroxysmal Afib Atypical AFlutter A  tach CHA2DS2Vasc is 4, on Xarelto S/p DCCV this admission with recurrent Atrial arrhythmia (ATach) and pauses. Started on Tikosyn  Tikosyn load is completed QTc stable  at the dose T inversion of unclear significance with minimal CAD in 2017 and HS trop negative, no CP B/l UE tingling more likely musculoskeletal, motor deficits noted   Tikosyn teaching has been completed She was not getting her home sprionolactone dose here Resume it with no addition or new electrolyte replacement otherwise.  EP follow up is in place    For questions or updates, please contact [facility] Please consult [email or web address] for contact info under        Signed, Sheilah Pigeon, PA-C  04/07/2023, 9:16 AM    I have seen and examined this patient with Francis Dowse.  Agree with above, note added to reflect my findings.  Feeling well. Had tingling in bilateral shoulders overnight.  GEN: Well nourished, well developed, in no acute distress  HEENT: normal  Neck: no JVD, carotid bruits, or masses Cardiac: RRR; no murmurs, rubs, or gallops,no edema  Respiratory:  clear to auscultation bilaterally, normal work of breathing GI: soft, nontender, nondistended, + BS MS: no deformity or atrophy  Skin: warm and dry Neuro:  Strength and sensation are intact Psych: euthymic mood, full affect   Persistent atrial fibrillation: tikosyn load in progress. Not prolonged after 6th dose. Ok for discharge home on current meds. Gloria Lambertson arrange clinic follow up. T wave inversions: no chest pain, HStroponin not elevated. Spence Soberano plan for outpatient evaluation.   Klayton Monie M. Halsey Hammen MD 04/08/2023 8:40 PM

## 2023-04-08 NOTE — Progress Notes (Signed)
Mobility Specialist Progress Note:   04/08/23 1159  Mobility  Activity Ambulated independently in hallway  Level of Assistance Independent after set-up  Assistive Device None  Distance Ambulated (ft) 400 ft  Activity Response Tolerated well  Mobility Referral Yes  $Mobility charge 1 Mobility  Mobility Specialist Start Time (ACUTE ONLY) 1120  Mobility Specialist Stop Time (ACUTE ONLY) 1132  Mobility Specialist Time Calculation (min) (ACUTE ONLY) 12 min    Pre Mobility: 77 HR Post Mobility:  86 HR  Pt received on EOB, agreeable to mobility. Ambulated independently in hallway w/ no AD. Asymptomatic and VSS throughout. Pt left on EOB with call bell and all needs met.  D'Vante Earlene Plater Mobility Specialist Please contact via Special educational needs teacher or Rehab office at [phone]

## 2023-04-13 DIAGNOSIS — M25561 Pain in right knee: Secondary | ICD-10-CM | POA: Diagnosis not present

## 2023-04-13 DIAGNOSIS — M25562 Pain in left knee: Secondary | ICD-10-CM | POA: Diagnosis not present

## 2023-04-18 ENCOUNTER — Ambulatory Visit (HOSPITAL_COMMUNITY)
Admit: 2023-04-18 | Discharge: 2023-04-18 | Disposition: A | Discharge status: Home / Self Care | Payer: Medicare PPO | Source: Ambulatory Visit | Attending: Internal Medicine | Admitting: Internal Medicine

## 2023-04-18 VITALS — BP 122/82 | HR 70 | Ht 62.0 in | Wt 137.6 lb

## 2023-04-18 DIAGNOSIS — Z7901 Long term (current) use of anticoagulants: Secondary | ICD-10-CM | POA: Insufficient documentation

## 2023-04-18 DIAGNOSIS — Z79899 Other long term (current) drug therapy: Secondary | ICD-10-CM | POA: Insufficient documentation

## 2023-04-18 DIAGNOSIS — Z5181 Encounter for therapeutic drug level monitoring: Secondary | ICD-10-CM | POA: Diagnosis not present

## 2023-04-18 DIAGNOSIS — I484 Atypical atrial flutter: Secondary | ICD-10-CM | POA: Insufficient documentation

## 2023-04-18 DIAGNOSIS — D6869 Other thrombophilia: Secondary | ICD-10-CM | POA: Diagnosis not present

## 2023-04-18 DIAGNOSIS — I480 Paroxysmal atrial fibrillation: Secondary | ICD-10-CM | POA: Diagnosis not present

## 2023-04-18 DIAGNOSIS — I10 Essential (primary) hypertension: Secondary | ICD-10-CM | POA: Diagnosis not present

## 2023-04-18 DIAGNOSIS — M25561 Pain in right knee: Secondary | ICD-10-CM | POA: Diagnosis not present

## 2023-04-18 DIAGNOSIS — M25562 Pain in left knee: Secondary | ICD-10-CM | POA: Diagnosis not present

## 2023-04-18 LAB — BASIC METABOLIC PANEL
Anion gap: 8 (ref 5–15)
BUN: 13 mg/dL (ref 8–23)
CO2: 26 mmol/L (ref 22–32)
Calcium: 9.4 mg/dL (ref 8.9–10.3)
Chloride: 100 mmol/L (ref 98–111)
Creatinine, Ser: 0.76 mg/dL (ref 0.44–1.00)
GFR, Estimated: >60 mL/min (ref >60)
Glucose, Bld: 94 mg/dL (ref 70–99)
Potassium: 3.8 mmol/L (ref 3.5–5.1)
Sodium: 134 mmol/L — ABNORMAL LOW (ref 135–145)

## 2023-04-18 LAB — MAGNESIUM: Magnesium: 1.8 mg/dL (ref 1.7–2.4)

## 2023-04-18 MED ORDER — DOFETILIDE 250 MCG PO CAPS: 250.0000 ug | ORAL_CAPSULE | Freq: Two times a day (BID) | ORAL | 1 refills | Status: DC

## 2023-04-18 NOTE — Discharge Summary (Addendum)
Physician Discharge Summary  Patient ID: Yolanda Bell MRN: [id number] DOB/AGE: 80-31-1942 80 y.o.  Admit date: 04/01/2023 Discharge date: 04/08/2023  Admission Diagnoses:  Discharge Diagnoses:  Principal Problem:   Atrial fibrillation with RVR [facility])   Discharged Condition: stable  Hospital Course: Patient is an 82 year old female with past medical history significant for Paroxysmal Atrial Fibrillation on Xarelto, constrictive pericarditis with perciardial stripping at [facility], HTN, liver disease, and spasmodic dystonia.  Patient has had multiple cardioversions and ablations in the past.  Patient presented to [facility] ED for tachycardia and palpitations despite taking 3 doses of propranolol at home.  Patient was admitted, started on Cardizem gtt., and cardiology team was consulted.  Cardiology team assisted in directing patient's care.  Patient was managed for atrial fibrillation with RVR, with possible tachybradycardia syndrome.  On 04/05/2023, patient underwent TEE/cardioversion, the patient was back in atrial flutter/A-fib and bradycardia, and pauses.  Electrophysiology team was consulted.  Patient was started on Tikosyn.  QTc interval was monitor was not Tikosyn.  Patient has been optimized.  Patient has been cleared for discharge by the cardiology and EP team.  Patient will follow-up with primary care provider on discharge.  Atrial fibrillation/flutter with RVR Tachybradycardia syndrome -Continue Toprol and Xarelto -Cards consulting, recurrent arrhythmias post cardioversion 04/05/2023 -Concern for tachybradycardia syndrome, EP consulted, started Tikosyn 04/05/2023 -Patient has been optimized.  Patient has been cleared for discharge by the electrophysiology team.     Chronic systolic CHF -EF down to 40-45%, mildly reduced RV on echo, previously 50-55% -Likely secondary to A-fib RVR -Clinically euvolemic, restart Aldactone today 04/07/2023: Compensated.   Hypertension -Stable continue  metoprolol and Cardizem gtt., Aldactone resumed   History of constrictive pericarditis -Status post pericardial stripping at [facility] 2006, stable   Left renal lesion -Stable, needs repeat surveillance   Insect bite -Left inner thigh, site appears unremarkable at this time  Elevated Troponin: -Type 2 elevation (Type 2 MI) -Likely secondary to Afib RVR -Troponin prior to discharge was 3.    Consults: Cardiology   Discharge Exam: Blood pressure 126/73, pulse 68, temperature 97.9 F (36.6 C), temperature source Oral, resp. rate 20, height 5\' 2"  (1.575 m), weight 61 kg, SpO2 98 %.   Disposition: Discharge disposition: 01-Home or Self Care       Discharge Instructions     Diet - low sodium heart healthy   Complete by: As directed    Increase activity slowly   Complete by: As directed       Allergies as of 04/08/2023       Reactions   Corticosteroids Other (See Comments)   Liver scarring as a child   Covid-19 (mrna) Vaccine Other (See Comments)   Postural Orthostatic Tachycardia Syndrome (POTS)   Other Other (See Comments)   Cannot take STEROIDS-unknown reaction-caused by internal scarring on liver.    Anesthesia precaution due to excessive liver scarring    Pacerone [amiodarone] Other (See Comments)   Dizziness Visual hallucinations   Erythromycin Nausea And Vomiting, Rash   Zyloprim [allopurinol] Rash, Other (See Comments)   Skin peeling More severe gout         Medication List     STOP taking these medications    Denta 5000 Plus 1.1 % Crea dental cream Generic drug: sodium fluoride   propranolol 10 MG tablet Commonly known as: INDERAL       TAKE these medications    CALCIUM + VITAMIN D3 PO Take 1 tablet by mouth daily.  denosumab 60 MG/ML Sosy injection Commonly known as: PROLIA Inject 60 mg into the skin every 6 (six) months.   dofetilide 250 MCG capsule Commonly known as: TIKOSYN Take 1 capsule (250 mcg total) by mouth 2 (two)  times daily.   metoprolol succinate 25 MG 24 hr tablet Commonly known as: TOPROL-XL Take one tablet by mouth (25 mg) twice a day.   SM Antibiotic ointment Generic drug: bacitracin Apply topically as needed for wound care.   spironolactone 50 MG tablet Commonly known as: ALDACTONE TAKE 1 TABLET BY MOUTH EVERY DAY   VITAMIN C PO Take 1 tablet by mouth daily.   Xarelto 20 MG Tabs tablet Generic drug: rivaroxaban TAKE 1 TABLET BY MOUTH DAILY WITH SUPPER        Time spent: 35 minutes.  SignedBarnetta Chapel 04/18/2023, 5:29 AM

## 2023-04-18 NOTE — Progress Notes (Signed)
Primary Care Physician: Moshe Cipro, NP Primary Cardiologist: Kristeen Miss, MD Electrophysiologist: Will Jorja Loa, MD     Referring Physician: Dr. Caleen Essex Yolanda Bell is a 82 y.o. female with a history of constrictive pericarditis, SVT s/p ablation by Dr. Graciela Husbands, HTN, liver disease, spasmodic dystonia, and paroxysmal atrial fibrillation who presents for consultation in the [facility]. Recent admission due to Afib with RVR from 6/21-28/24. She underwent TEE/DCCV on 6/25 with initially junctional rhythm in the 20s then sinus beat followed by 4-5 beats of SVT. She was ultimately started on Tikosyn. Discharged on Tikosyn 250 mcg BID. Patient is on Xarelto 20 mg for a CHADS2VASC score of 4.  On evaluation today, she is currently in NSR. She feels well since hospital discharge. No missed doses of Tikosyn or Xarelto.  Today, she denies symptoms of palpitations, chest pain, shortness of breath, orthopnea, PND, lower extremity edema, dizziness, presyncope, syncope, snoring, daytime somnolence, bleeding, or neurologic sequela. The patient is tolerating medications without difficulties and is otherwise without complaint today.    she has a BMI of Body mass index is 25.17 kg/m.Marland Kitchen Filed Weights   04/18/23 1046  Weight: 62.4 kg    Current Outpatient Medications  Medication Sig Dispense Refill   Ascorbic Acid (VITAMIN C PO) Take 1 tablet by mouth daily.     Calcium Carb-Cholecalciferol (CALCIUM + VITAMIN D3 PO) Take 1 tablet by mouth daily.     denosumab (PROLIA) 60 MG/ML SOSY injection Inject 60 mg into the skin every 6 (six) months.     metoprolol succinate (TOPROL-XL) 25 MG 24 hr tablet Take one tablet by mouth (25 mg) twice a day. 180 tablet 3   spironolactone (ALDACTONE) 50 MG tablet TAKE 1 TABLET BY MOUTH EVERY DAY (Patient taking differently: Take 50 mg by mouth daily.) 90 tablet 2   XARELTO 20 MG TABS tablet TAKE 1 TABLET BY MOUTH DAILY WITH  SUPPER 90 tablet 1   bacitracin ointment Apply topically as needed for wound care. (Patient not taking: Reported on 04/18/2023) 120 g 0   dofetilide (TIKOSYN) 250 MCG capsule Take 1 capsule (250 mcg total) by mouth 2 (two) times daily. 180 capsule 1   No current facility-administered medications for this encounter.    Atrial Fibrillation Management history:  Previous antiarrhythmic drugs: Tikosyn  Previous cardioversions: 04/05/23 Previous ablations: Afib/flutter ablation 2017 Anticoagulation history: Xarelto    ROS- All systems are reviewed and negative except as per the HPI above.  Physical Exam: BP 122/82   Pulse 70   Ht 5\' 2"  (1.575 m)   Wt 62.4 kg   BMI 25.17 kg/m   GEN: Well nourished, well developed in no acute distress NECK: No JVD; No carotid bruits CARDIAC: Regular rate and rhythm, no murmurs, rubs, gallops RESPIRATORY:  Clear to auscultation without rales, wheezing or rhonchi  ABDOMEN: Soft, non-tender, non-distended EXTREMITIES:  No edema; No deformity   EKG today demonstrates  Vent. rate 70 BPM PR interval 170 ms QRS duration 92 ms QT/QTcB 418/451 ms P-R-T axes 72 93 77 Normal sinus rhythm with sinus arrhythmia Rightward axis Borderline ECG When compared with ECG of 08-Apr-2023 10:49, PREVIOUS ECG IS PRESENT  Echo 04/03/23 demonstrated   1. Left ventricular ejection fraction, by estimation, is 40 to 45%. The  left ventricle has mildly decreased function. The left ventricle  demonstrates global hypokinesis. Left ventricular diastolic parameters are  indeterminate.   2. Right ventricular systolic function is mildly  reduced. The right  ventricular size is normal. There is normal pulmonary artery systolic  pressure. The estimated right ventricular systolic pressure is 22.7 mmHg.   3. Left atrial size was mildly dilated.   4. The mitral valve is abnormal. Mild mitral valve regurgitation. No  evidence of mitral stenosis.   5. The aortic valve is tricuspid.  There is mild calcification of the  aortic valve. There is mild thickening of the aortic valve. Aortic valve  regurgitation is not visualized. Aortic valve sclerosis is present, with  no evidence of aortic valve stenosis.   6. The inferior vena cava is dilated in size with >50% respiratory  variability, suggesting right atrial pressure of 8 mmHg.    ASSESSMENT & PLAN CHA2DS2-VASc Score = 4  The patient's score is based upon: CHF History: 0 HTN History: 1 Diabetes History: 0 Stroke History: 0 Vascular Disease History: 0 Age Score: 2 Gender Score: 1       ASSESSMENT AND PLAN: Paroxysmal Atrial Fibrillation (ICD10:  I48.0) / atypical atrial flutter The patient's CHA2DS2-VASc score is 4, indicating a 4.8% annual risk of stroke.    She is in NSR. Qtc stable. Continue Tikosyn 250 mcg BID. Bmet and mag drawn today. F/u 1 month with Mardelle Matte, 3 months with Afib clinic.  Secondary Hypercoagulable State (ICD10:  D68.69) The patient is at significant risk for stroke/thromboembolism based upon her CHA2DS2-VASc Score of 4.  Continue Rivaroxaban (Xarelto).  No missed doses.   Follow up as scheduled with Mardelle Matte, 3 months with Afib clinic.   Lake Bells, PA-C  Afib Clinic [facility] [address] [place], [place] [address] [phone]

## 2023-04-22 ENCOUNTER — Telehealth: Payer: Self-pay | Admitting: *Deleted

## 2023-04-22 NOTE — Telephone Encounter (Signed)
   [facility] Pre-operative Risk Assessment    Patient Name: Yolanda Bell  DOB: 20-May-1941 MRN: [id number]  HEARTCARE STAFF:  - IMPORTANT!!!!!! Under Visit Info/Reason for Call, type in Other and utilize the format Clearance MM/DD/YY or Clearance TBD. Do not use dashes or single digits. - Please review there is not already an duplicate clearance open for this procedure. - If request is for dental extraction, please clarify the # of teeth to be extracted. - If the patient is currently at the dentist's office, call Pre-Op Callback Staff (MA/nurse) to input urgent request.  - If the patient is not currently in the dentist office, please route to the Pre-Op pool.  Request for surgical clearance:  What type of surgery is being performed? Rt total knee arthroplasty  When is this surgery scheduled? 08/01/23  What type of clearance is required (medical clearance vs. Pharmacy clearance to hold med vs. Both)? Both   Are there any medications that need to be held prior to surgery and how long? Xarelto   Practice name and name of physician performing surgery? [facility], Dr Lequita Halt  What is the office phone number? [phone]   7.   What is the office fax number? [phone] Attn Tresa Endo [place]  8.   Anesthesia type (None, local, MAC, general) ? Choice    [name] 04/22/2023, 12:08 PM  _________________________________________________________________   (provider comments below)

## 2023-04-25 DIAGNOSIS — M25561 Pain in right knee: Secondary | ICD-10-CM | POA: Diagnosis not present

## 2023-04-25 DIAGNOSIS — M25562 Pain in left knee: Secondary | ICD-10-CM | POA: Diagnosis not present

## 2023-04-25 NOTE — Telephone Encounter (Signed)
Patient with diagnosis of afib on Xarelto for anticoagulation.    Procedure: right TKA Date of procedure: 08/01/23  CHA2DS2-VASc Score = 5  This indicates a 7.2% annual risk of stroke. The patient's score is based upon: CHF History: 1 HTN History: 1 Diabetes History: 0 Stroke History: 0 Vascular Disease History: 0 Age Score: 2 Gender Score: 1  Underwent DCCV 04/05/23.  CrCl 75mL/min Platelet count 187K  Per office protocol, patient can hold Xarelto for 3 days prior to procedure.    **This guidance is not considered finalized until pre-operative APP has relayed final recommendations.**

## 2023-04-26 ENCOUNTER — Telehealth: Payer: Self-pay | Admitting: *Deleted

## 2023-04-26 NOTE — Telephone Encounter (Signed)
Med rec and consent are done.      Patient Consent for Virtual Visit        Yolanda Bell has provided verbal consent on 04/26/2023 for a virtual visit (video or telephone).   CONSENT FOR VIRTUAL VISIT FOR:  Yolanda Bell  By participating in this virtual visit I agree to the following:  I hereby voluntarily request, consent and authorize [facility] and its employed or contracted physicians, physician assistants, nurse practitioners or other licensed health care professionals (the Practitioner), to provide me with telemedicine health care services (the "Services") as deemed necessary by the treating Practitioner. I acknowledge and consent to receive the Services by the Practitioner via telemedicine. I understand that the telemedicine visit will involve communicating with the Practitioner through live audiovisual communication technology and the disclosure of certain medical information by electronic transmission. I acknowledge that I have been given the opportunity to request an in-person assessment or other available alternative prior to the telemedicine visit and am voluntarily participating in the telemedicine visit.  I understand that I have the right to withhold or withdraw my consent to the use of telemedicine in the course of my care at any time, without affecting my right to future care or treatment, and that the Practitioner or I may terminate the telemedicine visit at any time. I understand that I have the right to inspect all information obtained and/or recorded in the course of the telemedicine visit and may receive copies of available information for a reasonable fee.  I understand that some of the potential risks of receiving the Services via telemedicine include:  Delay or interruption in medical evaluation due to technological equipment failure or disruption; Information transmitted may not be sufficient (e.g. poor resolution of images) to allow for appropriate medical  decision making by the Practitioner; and/or  In rare instances, security protocols could fail, causing a breach of personal health information.  Furthermore, I acknowledge that it is my responsibility to provide information about my medical history, conditions and care that is complete and accurate to the best of my ability. I acknowledge that Practitioner's advice, recommendations, and/or decision may be based on factors not within their control, such as incomplete or inaccurate data provided by me or distortions of diagnostic images or specimens that may result from electronic transmissions. I understand that the practice of medicine is not an exact science and that Practitioner makes no warranties or guarantees regarding treatment outcomes. I acknowledge that a copy of this consent can be made available to me via my patient portal [facility] MyChart), or I can request a printed copy by calling the office of  HeartCare.    I understand that my insurance will be billed for this visit.   I have read or had this consent read to me. I understand the contents of this consent, which adequately explains the benefits and risks of the Services being provided via telemedicine.  I have been provided ample opportunity to ask questions regarding this consent and the Services and have had my questions answered to my satisfaction. I give my informed consent for the services to be provided through the use of telemedicine in my medical care

## 2023-04-26 NOTE — Telephone Encounter (Signed)
Pt has been scheduled tele pre op appt 06/20/23 @ 10:20. Pt said she is going to d/w further at her 05/20/23 appt with Otilio Saber, [facility] if felt ok to proceed with the surgery. She is not asking Mardelle Matte for the clearance, she just wants to see what his thoughts are  for her and if feels that it would be safe. We have scheduled a tele appt. If pt decides to hold on on surgery she will then cancel the tele appt.

## 2023-04-26 NOTE — Telephone Encounter (Signed)
   Name: Yolanda Bell  DOB: 07-Jan-1941  MRN: [id number]  Primary Cardiologist: Kristeen Miss, MD   Preoperative team, please contact this patient and set up a phone call appointment for further preoperative risk assessment. Please obtain consent and complete medication review. Thank you for your help.  I confirm that guidance regarding antiplatelet and oral anticoagulation therapy has been completed and, if necessary, noted below.  Pharmacy has provided recommendations for anticoagulation hold.   Ronney Asters, NP 04/26/2023, 10:32 AM [facility]

## 2023-04-28 DIAGNOSIS — M25561 Pain in right knee: Secondary | ICD-10-CM | POA: Diagnosis not present

## 2023-04-28 DIAGNOSIS — M25562 Pain in left knee: Secondary | ICD-10-CM | POA: Diagnosis not present

## 2023-05-02 DIAGNOSIS — Z0000 Encounter for general adult medical examination without abnormal findings: Secondary | ICD-10-CM | POA: Diagnosis not present

## 2023-05-02 DIAGNOSIS — D6869 Other thrombophilia: Secondary | ICD-10-CM | POA: Diagnosis not present

## 2023-05-02 DIAGNOSIS — M810 Age-related osteoporosis without current pathological fracture: Secondary | ICD-10-CM | POA: Diagnosis not present

## 2023-05-02 DIAGNOSIS — C642 Malignant neoplasm of left kidney, except renal pelvis: Secondary | ICD-10-CM | POA: Diagnosis not present

## 2023-05-02 DIAGNOSIS — I5032 Chronic diastolic (congestive) heart failure: Secondary | ICD-10-CM | POA: Diagnosis not present

## 2023-05-02 DIAGNOSIS — J42 Unspecified chronic bronchitis: Secondary | ICD-10-CM | POA: Diagnosis not present

## 2023-05-02 DIAGNOSIS — I480 Paroxysmal atrial fibrillation: Secondary | ICD-10-CM | POA: Diagnosis not present

## 2023-05-02 DIAGNOSIS — D509 Iron deficiency anemia, unspecified: Secondary | ICD-10-CM | POA: Diagnosis not present

## 2023-05-02 DIAGNOSIS — I700 Atherosclerosis of aorta: Secondary | ICD-10-CM | POA: Diagnosis not present

## 2023-05-02 DIAGNOSIS — I2510 Atherosclerotic heart disease of native coronary artery without angina pectoris: Secondary | ICD-10-CM | POA: Diagnosis not present

## 2023-05-04 DIAGNOSIS — M25562 Pain in left knee: Secondary | ICD-10-CM | POA: Diagnosis not present

## 2023-05-04 DIAGNOSIS — M25561 Pain in right knee: Secondary | ICD-10-CM | POA: Diagnosis not present

## 2023-05-10 DIAGNOSIS — M25562 Pain in left knee: Secondary | ICD-10-CM | POA: Diagnosis not present

## 2023-05-10 DIAGNOSIS — M25561 Pain in right knee: Secondary | ICD-10-CM | POA: Diagnosis not present

## 2023-05-19 NOTE — Progress Notes (Addendum)
Electrophysiology Office Note:   Date:  05/20/2023  ID:  Yitzel, Catoe [birth year], MRN [id number]  Primary Cardiologist: Kristeen Miss, MD Electrophysiologist: Regan Lemming, MD      History of Present Illness:   Yolanda Bell is a 82 y.o. female with h/o HTN, constrictive pericarditis s/p pericardial stripping at [facility] (2006), chronic dCHF, paroxysmal AF, SVT s/p ablation,  seen today for routine electrophysiology followup.   Admit from 6/21-28/24 in setting of AFlutter/RVR.  She underwent DCCV on 6/25 with subsequent junctional rhythm in the 20's then sinus beat followed by 4-5 beats of SVT.  She was ultimately started on Tikosyn 250mg  BID. She followed up in the AF clinic on 04/18/23 and was in NSR. No missed doses of Tikosyn or Xarelto.   Since last being seen in our clinic the patient reports doing ok - she has multiple questions outlined for the visit.  Since starting Tikosyn, she feels much better. She does have some residual PVC's at times but are brief. Has had no known episodes of AF.   She denies chest pain, palpitations, dyspnea, PND, orthopnea, nausea, vomiting, dizziness, syncope, edema, weight gain, or early satiety.   Review of systems complete and found to be negative unless listed in HPI.   EP Information / Studies Reviewed:    EKG is ordered today. Personal review as below.  EKG Interpretation Date/Time:  Friday May 20 2023 11:07:05 EDT Ventricular Rate:  60 PR Interval:  172 QRS Duration:  96 QT Interval:  434 QTC Calculation: 434 R Axis:   70  Text Interpretation: Sinus rhythm with marked sinus arrhythmia When compared with ECG of 18-Apr-2023 11:14, No significant change was found Confirmed by Canary Brim ([id number]) on 05/20/2023 11:22:02 AM   Arrhythmia / AAD  SVT s/p Ablation   PAF  > s/p PVI ablation 08/2016, DCCV 03/2023  Tikosyn > initiated 03/2023   Studies:  ECHO 03/2023 > LVEF 40-45%, LV global hypokinesis, RV systolic function mildly  reduced, LA mildly dilated, mild MVR, mild calcification of AV, AV sclerosis w/o stenosis  Risk Assessment/Calculations:    CHA2DS2-VASc Score = 5   This indicates a 7.2% annual risk of stroke. The patient's score is based upon: CHF History: 1 HTN History: 1 Diabetes History: 0 Stroke History: 0 Vascular Disease History: 0 Age Score: 2 Gender Score: 1             Physical Exam:   VS:  BP 122/70   Pulse 60   Ht 5\' 2"  (1.575 m)   Wt 136 lb 6.4 oz (61.9 kg)   SpO2 98%   BMI 24.95 kg/m    Wt Readings from Last 3 Encounters:  05/20/23 136 lb 6.4 oz (61.9 kg)  04/18/23 137 lb 9.6 oz (62.4 kg)  04/01/23 134 lb 7.7 oz (61 kg)     GEN: Well nourished, well developed in no acute distress NECK: No JVD; No carotid bruits CARDIAC: Regular rate and rhythm, no murmurs, rubs, gallops RESPIRATORY:  Clear to auscultation without rales, wheezing or rhonchi  ABDOMEN: Soft, non-tender, non-distended EXTREMITIES:  No edema; No deformity   ASSESSMENT AND PLAN:    Paroxysmal Atrial Fibrillation  Atypical Atrial Flutter  Atrial Tachycardia   Recent admit with recurrent atrial arrhythmia (ATach) and pauses s/p DCCV, Tikosyn load (03/2023) -continue tikosyn 250mg  BID  -EKG with stable intervals  -assess BMP, Mg+  -continue Toprol 25mg  BID > has been taking 12.5 occasionally in the evenings when her HR  was <60 (reviewed, mid 50's per pt, asymptomatic)   Secondary Hypercoagulable State  CHA2DS2VASc 5. -continue Xarelto 20 mg QD -labs from 04/18/23 reviewed with cr Cl 58 mL/min. Repeat labs today.   PVC's  -Toprol  Pending Ortho surgery in October. Working through clearance with Cardiology.    Follow up with EP APP in 3 months  Signed, Canary Brim, MSN, APRN, NP-C, AGACNP-BC [facility] - Electrophysiology  05/20/2023, 11:22 AM

## 2023-05-20 ENCOUNTER — Encounter: Payer: Self-pay | Admitting: Student

## 2023-05-20 ENCOUNTER — Ambulatory Visit: Payer: Medicare PPO | Attending: Student | Admitting: Pulmonary Disease

## 2023-05-20 VITALS — BP 122/70 | HR 60 | Ht 62.0 in | Wt 136.4 lb

## 2023-05-20 DIAGNOSIS — I4819 Other persistent atrial fibrillation: Secondary | ICD-10-CM

## 2023-05-20 DIAGNOSIS — Z5181 Encounter for therapeutic drug level monitoring: Secondary | ICD-10-CM | POA: Diagnosis not present

## 2023-05-20 DIAGNOSIS — I480 Paroxysmal atrial fibrillation: Secondary | ICD-10-CM

## 2023-05-20 DIAGNOSIS — D6869 Other thrombophilia: Secondary | ICD-10-CM

## 2023-05-20 DIAGNOSIS — Z79899 Other long term (current) drug therapy: Secondary | ICD-10-CM | POA: Diagnosis not present

## 2023-05-20 LAB — BASIC METABOLIC PANEL
BUN/Creatinine Ratio: 21 (ref 12–28)
BUN: 15 mg/dL (ref 8–27)
CO2: 23 mmol/L (ref 20–29)
Calcium: 9.8 mg/dL (ref 8.7–10.3)
Chloride: 96 mmol/L (ref 96–106)
Creatinine, Ser: 0.71 mg/dL (ref 0.57–1.00)
Glucose: 96 mg/dL (ref 70–99)
Potassium: 3.9 mmol/L (ref 3.5–5.2)
Sodium: 135 mmol/L (ref 134–144)
eGFR: 86 mL/min/{1.73_m2} (ref >59)

## 2023-05-20 LAB — MAGNESIUM: Magnesium: 1.9 mg/dL (ref 1.6–2.3)

## 2023-05-20 NOTE — Patient Instructions (Signed)
Medication Instructions:  Your physician recommends that you continue on your current medications as directed. Please refer to the Current Medication list given to you today.  *If you need a refill on your cardiac medications before your next appointment, please call your pharmacy*  Lab Work: BMET, MAG-TODAY If you have labs (blood work) drawn today and your tests are completely normal, you will receive your results only by: MyChart Message (if you have MyChart) OR A paper copy in the mail If you have any lab test that is abnormal or we need to change your treatment, we will call you to review the results.  Follow-Up: At [facility], you and your health needs are our priority.  As part of our continuing mission to provide you with exceptional heart care, we have created designated Provider Care Teams.  These Care Teams include your primary Cardiologist (physician) and Advanced Practice Providers (APPs -  Physician Assistants and Nurse Practitioners) who all work together to provide you with the care you need, when you need it.  Your next appointment:   3 month(s)  Provider:   You may see Will Jorja Loa, MD or one of the following Advanced Practice Providers on your designated Care Team:   Francis Dowse, [place] [address] [place], [place] Sherie Don, NP Canary Brim, NP

## 2023-05-24 DIAGNOSIS — M8588 Other specified disorders of bone density and structure, other site: Secondary | ICD-10-CM | POA: Diagnosis not present

## 2023-05-24 DIAGNOSIS — R29890 Loss of height: Secondary | ICD-10-CM | POA: Diagnosis not present

## 2023-05-24 DIAGNOSIS — Z1231 Encounter for screening mammogram for malignant neoplasm of breast: Secondary | ICD-10-CM | POA: Diagnosis not present

## 2023-05-24 DIAGNOSIS — N958 Other specified menopausal and perimenopausal disorders: Secondary | ICD-10-CM | POA: Diagnosis not present

## 2023-05-30 ENCOUNTER — Other Ambulatory Visit: Payer: Self-pay | Admitting: Internal Medicine

## 2023-05-31 ENCOUNTER — Telehealth: Payer: Self-pay | Admitting: Cardiology

## 2023-05-31 NOTE — Telephone Encounter (Signed)
Patient states during 8/09 appointment Canary Brim, NP advised that her heart is in rhythm. She states she assumed it menat she didn't have any arrhythmias but when she read the office visit note she saw things that made her believe otherwise. Patient would like a call back to discuss. Please clarify.

## 2023-05-31 NOTE — Telephone Encounter (Signed)
Spoke with pt and advised per Yolanda Saber, PA-C pt was in normal rhythm at her office visit.  Pt verbalizes understanding and thanked Charity fundraiser for the confirmation.  Nothing further needed at this time.

## 2023-06-19 NOTE — Progress Notes (Unsigned)
Virtual Visit via Telephone Note   Because of Caleesi Glatt [facility] co-morbid illnesses, she is at least at moderate risk for complications without adequate follow up.  This format is felt to be most appropriate for this patient at this time.  The patient did not have access to video technology/had technical difficulties with video requiring transitioning to audio format only (telephone).  All issues noted in this document were discussed and addressed.  No physical exam could be performed with this format.  Please refer to the patient's chart for her consent to telehealth for [facility].  Evaluation Performed:  Preoperative cardiovascular risk assessment _____________   Date:  06/20/2023   Patient ID:  Yolanda Bell, Birth 1941-03-15, MRN [id number] Patient Location:  Home Provider location:   Office  Primary Care Provider:  Inez Pilgrim, NP Primary Cardiologist:  Kristeen Miss, MD  Chief Complaint / Patient Profile   82 y.o. y/o female with a h/o HTN, Atrial flutter a/p DCCV 04/05/2023., constrictive pericarditis, s/p pericardial stripping, 2006, chronic diastolic CHF, SVT s/p ablation.  She  is pending right total knee arthroplasty on 08/01/2023 by Dr. Despina Hick and presents today for telephonic preoperative cardiovascular risk assessment with recommendations on holding Xarelto.   History of Present Illness    Yolanda Bell is a 82 y.o. female who presents via audio/video conferencing for a telehealth visit today.  Pt was last seen in cardiology clinic on 05/20/2023,   At that time she was seen post hospitalization with admission due to Afib with RVR from 6/21-28/24. She underwent TEE/DCCV on 6/25 with initially junctional rhythm in the 20s then sinus beat followed by 4-5 beats of SVT. She was ultimately started on Tikosyn. Discharged on Tikosyn 250 mcg BID. Patient is on Xarelto 20 mg for a CHADS2VASC score of 4.   The patient is now pending procedure as outlined above. Since her  last visit, she has done well.  Continues to walk but uses a cane on uneven surfaces.  She remains active up through her home able to complete ADLs, do some light yard work, and take care of her home.  She denies any excessive bleeding or bruising on Xarelto, associated chest pain or shortness of breath.  Past Medical History    Past Medical History:  Diagnosis Date   Atrial fibrillation (HCC)    Status post TEE cardioversion   Constrictive pericarditis    2 status [facility]   Gout    Hypertension    Spastic dysphonia    Supraventricular tachycardia    Status post RF ablation by Dr. Anabel Halon   Past Surgical History:  Procedure Laterality Date   ABDOMINAL HYSTERECTOMY     1987   BOWEL RESECTION N/A 06/03/2022   Procedure: SMALL BOWEL RESECTION;  Surgeon: Gaynelle Adu, MD;  Location: [facility] OR;  Service: General;  Laterality: N/A;   CARDIOVERSION  11/21/2012   Procedure: CARDIOVERSION;  Surgeon: [name], MD;  Location: [facility] ENDOSCOPY;  Service: Cardiovascular;;   CARDIOVERSION N/A 06/10/2014   Procedure: CARDIOVERSION;  Surgeon: Vesta Mixer, MD;  Location: [facility] ENDOSCOPY;  Service: Cardiovascular;  Laterality: N/A;   CARDIOVERSION N/A 10/14/2015   Procedure: CARDIOVERSION;  Surgeon: Wendall Stade, MD;  Location: [facility] ENDOSCOPY;  Service: Cardiovascular;  Laterality: N/A;   CARDIOVERSION N/A 05/12/2016   Procedure: CARDIOVERSION;  Surgeon: Chilton Si, MD;  Location: [facility] ENDOSCOPY;  Service: Cardiovascular;  Laterality: N/A;   CARDIOVERSION N/A 04/09/2022   Procedure:  CARDIOVERSION;  Surgeon: Parke Poisson, MD;  Location: [facility] ENDOSCOPY;  Service: Cardiovascular;  Laterality: N/A;   CARDIOVERSION N/A 04/05/2023   Procedure: CARDIOVERSION;  Surgeon: Jodelle Red, MD;  Location: [facility] INVASIVE CV LAB;  Service: Cardiovascular;  Laterality: N/A;   CHOLECYSTECTOMY     1989   ELECTROPHYSIOLOGIC STUDY N/A 08/20/2016   Procedure: Atrial  Fibrillation Ablation;  Surgeon: Will Jorja Loa, MD;  Location: [facility] INVASIVE CV LAB;  Service: Cardiovascular;  Laterality: N/A;   EYE SURGERY     cataracts   LAPAROTOMY N/A 06/03/2022   Procedure: EXPLORATORY LAPAROTOMY;  Surgeon: Gaynelle Adu, MD;  Location: [facility] OR;  Service: General;  Laterality: N/A;   PERICARDIECTOMY     TEE WITHOUT CARDIOVERSION N/A 06/10/2014   Procedure: TRANSESOPHAGEAL ECHOCARDIOGRAM (TEE);  Surgeon: Vesta Mixer, MD;  Location: [facility] ENDOSCOPY;  Service: Cardiovascular;  Laterality: N/A;   TONSILLECTOMY      Allergies  Allergies  Allergen Reactions   Corticosteroids Other (See Comments)    Liver scarring as a child   Covid-19 (Mrna) Vaccine Other (See Comments)    Postural Orthostatic Tachycardia Syndrome (POTS)   Other Other (See Comments)    Cannot take STEROIDS-unknown reaction-caused by internal scarring on liver.    Anesthesia precaution due to excessive liver scarring    Pacerone [Amiodarone] Other (See Comments)    Dizziness Visual hallucinations   Erythromycin Nausea And Vomiting and Rash   Zyloprim [Allopurinol] Rash and Other (See Comments)    Skin peeling More severe gout     Home Medications    Prior to Admission medications   Medication Sig Start Date End Date Taking? Authorizing Provider  Ascorbic Acid (VITAMIN C PO) Take 1 tablet by mouth daily.    [provider]  Calcium Carb-Cholecalciferol (CALCIUM + VITAMIN D3 PO) Take 1 tablet by mouth daily.    [provider]  denosumab (PROLIA) 60 MG/ML SOSY injection Inject 60 mg into the skin every 6 (six) months.    [provider]  dofetilide (TIKOSYN) 250 MCG capsule Take 1 capsule (250 mcg total) by mouth 2 (two) times daily. 04/18/23 05/20/23  Eustace Pen, PA-C  metoprolol succinate (TOPROL-XL) 25 MG 24 hr tablet Take one tablet by mouth (25 mg) twice a day. 04/09/22   Parke Poisson, MD  spironolactone (ALDACTONE) 50 MG tablet TAKE 1 TABLET BY MOUTH  EVERY DAY Patient taking differently: Take 50 mg by mouth daily. 11/18/22   Nahser, Deloris Ping, MD  XARELTO 20 MG TABS tablet TAKE 1 TABLET BY MOUTH DAILY WITH SUPPER 11/26/22   Nahser, Deloris Ping, MD    Physical Exam    Vital Signs:  KHALIDAH SEGRETI does not have vital signs available for review today.   Given telephonic nature of communication, physical exam is limited. AAOx3. NAD. Normal affect.  Speech and respirations are unlabored.  Accessory Clinical Findings    None  Assessment & Plan    1.  Preoperative Cardiovascular Risk Assessment:  According to the Revised Cardiac Risk Index (RCRI), her Perioperative Risk of Major Cardiac Event is (%): 0.9  Her Functional Capacity in METs is: 5.94 according to the Duke Activity Status Index (DASI).  Per office protocol, patient can hold Xarelto for 3 days prior to procedure.    The patient was advised that if she develops new symptoms prior to surgery to contact our office to arrange for a follow-up visit, and she verbalized understanding.  Therefore, based on ACC/AHA guidelines, patient would  be at acceptable risk for the planned procedure without further cardiovascular testing. I will route this recommendation to the requesting party via Epic fax function.   A copy of this note will be routed to requesting surgeon.  Time:   Today, I have spent 10 minutes with the patient with telehealth technology discussing medical history, symptoms, and management plan.     Joni Reining, NP  06/20/2023, 10:27 AM

## 2023-06-20 ENCOUNTER — Ambulatory Visit: Payer: Medicare PPO | Attending: Cardiovascular Disease

## 2023-06-20 DIAGNOSIS — Z01810 Encounter for preprocedural cardiovascular examination: Secondary | ICD-10-CM | POA: Diagnosis not present

## 2023-06-20 DIAGNOSIS — Z01818 Encounter for other preprocedural examination: Secondary | ICD-10-CM

## 2023-06-21 ENCOUNTER — Encounter: Payer: Self-pay | Admitting: Adult Health

## 2023-06-21 NOTE — Telephone Encounter (Signed)
Error

## 2023-06-21 NOTE — Telephone Encounter (Signed)
Patient called because she is concerned about her telephone visit from yesterday for her pre op clearance.  She states that in the section History of Present Illness it states on 05/20/2023 she was in Atrial flutter and required a DCCV.  She states that occurred back June.  She doesn't want this to interfere with surgery.  She states it's right in the history.

## 2023-06-22 ENCOUNTER — Other Ambulatory Visit: Payer: Self-pay | Admitting: Cardiovascular Disease

## 2023-06-22 DIAGNOSIS — I480 Paroxysmal atrial fibrillation: Secondary | ICD-10-CM

## 2023-06-22 NOTE — Telephone Encounter (Signed)
I will forward this to Joni Reining, DNP for review.

## 2023-06-22 NOTE — Telephone Encounter (Signed)
Xarelto 20mg  refill request received. Pt is 82 years old, weight-61.9kg, Crea-0.71 on 05/20/23, last seen by Canary Brim, NP on 05/20/23, Diagnosis-Afib/flutter, CrCl-61.75 mL/min; Dose is appropriate based on dosing criteria. Will send in refill to requested pharmacy.

## 2023-06-22 NOTE — Telephone Encounter (Signed)
Patient is requesting to have additional information in her clearance note corrected. She states she spoke with Joni Reining, NP and she forgot to mention something else that needs to be corrected. She states she was advised to hold Xarelto 3 days prior to her procedure in October. She states per note, Patient stated that Xarelto does not cause any excessive bleeding. Patient states this is not correct because she bruises and bleeds very easily. Patient would like a call back to confirm this information has been corrected.

## 2023-06-23 ENCOUNTER — Other Ambulatory Visit: Payer: Self-pay

## 2023-06-23 MED ORDER — METOPROLOL SUCCINATE ER 25 MG PO TB24: ORAL_TABLET | ORAL | 1 refills | Status: DC

## 2023-06-23 NOTE — Telephone Encounter (Signed)
Pt's medication was sent to pt's pharmacy as requested. Confirmation received.  °

## 2023-07-06 NOTE — H&P (Signed)
TOTAL KNEE ADMISSION H&P  Patient is being admitted for right total knee arthroplasty.  Subjective:  Chief Complaint: Right knee pain.  HPI: Yolanda Bell, 82 y.o. female has a history of pain and functional disability in the right knee due to arthritis and has failed non-surgical conservative treatments for greater than 12 weeks to include corticosteriod injections, viscosupplementation injections, and activity modification. Onset of symptoms was gradual, starting 5 years ago with gradually worsening course since that time. The patient noted no past surgery on the right knee.  Patient currently rates pain in the right knee at 7 out of 10 with activity. Patient has worsening of pain with activity and weight bearing and pain that interferes with activities of daily living. Patient has evidence of periarticular osteophytes and joint space narrowing by imaging studies. There is no active infection.  Patient Active Problem List   Diagnosis Date Noted   Encounter for monitoring dofetilide therapy 04/18/2023   Hypercoagulable state due to paroxysmal atrial fibrillation (HCC) 04/18/2023   Atrial fibrillation with RVR (HCC) 04/02/2023   Chronic diastolic CHF (congestive heart failure) (HCC) 04/02/2023   Hypophosphatemia 06/07/2022   GI bleeding 06/07/2022   Hypokalemia 06/06/2022   History of COVID-19 06/02/2022   Diverticulitis large intestine 06/02/2022   Nodule of kidney 06/02/2022   Compression fracture of lumbar vertebra (HCC) 06/02/2022   History of liver disease 06/02/2022   Diverticulitis 06/02/2022   Diverticulitis of small bowel 06/01/2022   Urinary urgency 06/01/2022   Tachycardia 03/31/2022   Near syncope 06/21/2021   Acute blood loss anemia 06/21/2021   Syncope and collapse 06/20/2021   Fall    Hypotension    Hematoma    PAF (paroxysmal atrial fibrillation) (HCC) 08/20/2016   Atrial fibrillation (HCC) 05/13/2011   Constrictive pericarditis 05/13/2011   Supraventricular  tachycardia 05/13/2011   Hypertension 05/13/2011   Gout 05/13/2011    Past Medical History:  Diagnosis Date   Atrial fibrillation (HCC)    Status post TEE cardioversion   Constrictive pericarditis    2 status post pericardial stripping at the [facility]   Gout    Hypertension    Spastic dysphonia    Supraventricular tachycardia    Status post RF ablation by Dr. Anabel Halon    Past Surgical History:  Procedure Laterality Date   ABDOMINAL HYSTERECTOMY     1987   BOWEL RESECTION N/A 06/03/2022   Procedure: SMALL BOWEL RESECTION;  Surgeon: Gaynelle Adu, MD;  Location: [facility] OR;  Service: General;  Laterality: N/A;   CARDIOVERSION  11/21/2012   Procedure: CARDIOVERSION;  Surgeon: [name], MD;  Location: [facility] ENDOSCOPY;  Service: Cardiovascular;;   CARDIOVERSION N/A 06/10/2014   Procedure: CARDIOVERSION;  Surgeon: Vesta Mixer, MD;  Location: [facility] ENDOSCOPY;  Service: Cardiovascular;  Laterality: N/A;   CARDIOVERSION N/A 10/14/2015   Procedure: CARDIOVERSION;  Surgeon: Wendall Stade, MD;  Location: [facility] ENDOSCOPY;  Service: Cardiovascular;  Laterality: N/A;   CARDIOVERSION N/A 05/12/2016   Procedure: CARDIOVERSION;  Surgeon: Chilton Si, MD;  Location: [facility] ENDOSCOPY;  Service: Cardiovascular;  Laterality: N/A;   CARDIOVERSION N/A 04/09/2022   Procedure: CARDIOVERSION;  Surgeon: Parke Poisson, MD;  Location: [facility] ENDOSCOPY;  Service: Cardiovascular;  Laterality: N/A;   CARDIOVERSION N/A 04/05/2023   Procedure: CARDIOVERSION;  Surgeon: Jodelle Red, MD;  Location: [facility] INVASIVE CV LAB;  Service: Cardiovascular;  Laterality: N/A;   CHOLECYSTECTOMY     1989   ELECTROPHYSIOLOGIC STUDY N/A 08/20/2016   Procedure: Atrial Fibrillation Ablation;  Surgeon:  Will Jorja Loa, MD;  Location: [facility] INVASIVE CV LAB;  Service: Cardiovascular;  Laterality: N/A;   EYE SURGERY     cataracts   LAPAROTOMY N/A 06/03/2022   Procedure: EXPLORATORY LAPAROTOMY;  Surgeon: Gaynelle Adu, MD;   Location: [facility] OR;  Service: General;  Laterality: N/A;   PERICARDIECTOMY     TEE WITHOUT CARDIOVERSION N/A 06/10/2014   Procedure: TRANSESOPHAGEAL ECHOCARDIOGRAM (TEE);  Surgeon: Vesta Mixer, MD;  Location: [facility] ENDOSCOPY;  Service: Cardiovascular;  Laterality: N/A;   TONSILLECTOMY      Prior to Admission medications   Medication Sig Start Date End Date Taking? Authorizing Provider  Ascorbic Acid (VITAMIN C PO) Take 1 tablet by mouth daily.    [provider]  Calcium Carb-Cholecalciferol (CALCIUM + VITAMIN D3 PO) Take 1 tablet by mouth daily.    [provider]  denosumab (PROLIA) 60 MG/ML SOSY injection Inject 60 mg into the skin every 6 (six) months.    [provider]  dofetilide (TIKOSYN) 250 MCG capsule Take 1 capsule (250 mcg total) by mouth 2 (two) times daily. 04/18/23 05/20/23  Eustace Pen, PA-C  metoprolol succinate (TOPROL-XL) 25 MG 24 hr tablet Take one tablet by mouth (25 mg) twice a day. 06/23/23   Nahser, Deloris Ping, MD  spironolactone (ALDACTONE) 50 MG tablet TAKE 1 TABLET BY MOUTH EVERY DAY 06/22/23   Nahser, Deloris Ping, MD  XARELTO 20 MG TABS tablet TAKE 1 TABLET BY MOUTH DAILY WITH SUPPER 06/22/23   Nahser, Deloris Ping, MD    Allergies  Allergen Reactions   Corticosteroids Other (See Comments)    Liver scarring as a child   Covid-19 (Mrna) Vaccine Other (See Comments)    Postural Orthostatic Tachycardia Syndrome (POTS)   Other Other (See Comments)    Cannot take STEROIDS-unknown reaction-caused by internal scarring on liver.    Anesthesia precaution due to excessive liver scarring    Pacerone [Amiodarone] Other (See Comments)    Dizziness Visual hallucinations   Erythromycin Nausea And Vomiting and Rash   Zyloprim [Allopurinol] Rash and Other (See Comments)    Skin peeling More severe gout     Social History   Socioeconomic History   Marital status: Divorced    Spouse name: Not on file   Number of children: Not on file   Years of  education: Not on file   Highest education level: Not on file  Occupational History   Not on file  Tobacco Use   Smoking status: Never   Smokeless tobacco: Never  Vaping Use   Vaping status: Never Used  Substance and Sexual Activity   Alcohol use: Yes    Comment: occasionally   Drug use: No   Sexual activity: Not on file  Other Topics Concern   Not on file  Social History Narrative   Not on file   Social Determinants of Health   Financial Resource Strain: Not on file  Food Insecurity: No Food Insecurity (04/02/2023)   Hunger Vital Sign    Worried About Running Out of Food in the Last Year: Never true    Ran Out of Food in the Last Year: Never true  Transportation Needs: No Transportation Needs (04/02/2023)   PRAPARE - Administrator, Civil Service (Medical): No    Lack of Transportation (Non-Medical): No  Physical Activity: Not on file  Stress: Not on file  Social Connections: Not on file  Intimate Partner Violence: Not At Risk (04/02/2023)   Humiliation, Afraid, Rape,  and Kick questionnaire    Fear of Current or Ex-Partner: No    Emotionally Abused: No    Physically Abused: No    Sexually Abused: No    Tobacco Use: Low Risk  (05/20/2023)   Patient History    Smoking Tobacco Use: Never    Smokeless Tobacco Use: Never    Passive Exposure: Not on file   Social History   Substance and Sexual Activity  Alcohol Use Yes   Comment: occasionally    Family History  Problem Relation Age of Onset   Heart disease Mother    Emphysema Father    Breast cancer Sister     ROS  Objective:  Physical Exam: - Patient is alert and oriented. in no apparent distress.   - Evaluation of her hips shows normal range of motion with no discomfort.   - Examination of the right knee shows no effusion. She has varus deformity. Range of motion is about 5 to 120. There is marked crepitus on range of motion. She is tender medially. There is no lateral tenderness or instability.    - Examination of the left knee shows slight varus, range of motion 5 to 125. Some crepitus with some tenderness medially. No lateral tenderness or instability.   IMAGING:  Radiographs of both knees (AP and lateral) demonstrate medial patellofemoral bone-on-bone changes in both knees, with the right side slightly worse than the left. This indicates advanced end-stage arthritis in the right knee, which is worse than the left knee.  Assessment/Plan:  End stage arthritis, right knee   The patient history, physical examination, clinical judgment of the provider and imaging studies are consistent with end stage degenerative joint disease of the right knee and total knee arthroplasty is deemed medically necessary. The treatment options including medical management, injection therapy arthroscopy and arthroplasty were discussed at length. The risks and benefits of total knee arthroplasty were presented and reviewed. The risks due to aseptic loosening, infection, stiffness, patella tracking problems, thromboembolic complications and other imponderables were discussed. The patient acknowledged the explanation, agreed to proceed with the plan and consent was signed. Patient is being admitted for inpatient treatment for surgery, pain control, PT, OT, prophylactic antibiotics, VTE prophylaxis, progressive ambulation and ADLs and discharge planning. The patient is planning to be discharged  home .   Patient's anticipated LOS is less than 2 midnights, meeting these requirements: - Younger than 70 - Lives within 1 hour of care - Has a competent adult at home to recover with post-op recover - NO history of  - Chronic pain requiring opiods  - Diabetes  - Coronary Artery Disease  - Heart failure  - Heart attack  - Stroke  - DVT/VTE  - Respiratory Failure/COPD  - Renal failure  - Anemia  Therapy Plans: EO with Mike Disposition: Home with son DOS, then will have hired help for 3 nights and neighbors  checking in on her.  Planned DVT Prophylaxis: Xarelto 10mg  QD DME Needed: Ice machine. Has RW and cane PCP: Moshe Cipro, NP (clearance received) Cardiologist: Kristeen Miss, MD (clearance received) TXA: IV Allergies: Corticosteroids (liver dysfunction), allopurinol (made gout worse), amiodarone (hallucinations), COVID-19 vaccine (syncope) Anesthesia Concerns: None BMI: 23.4 Last HgbA1c: Not diabetic [facility]   Other: -Hold Xarelto 3 days prior to surgery. (A fib) -NO STEROIDS  - Patient was instructed on what medications to stop prior to surgery. - Follow-up visit in 2 weeks with Dr. Lequita Halt - Begin physical therapy following surgery -  Pre-operative lab work as pre-surgical testing - Prescriptions will be provided in hospital at time of discharge  Weston Brass, PA-C Orthopedic Surgery [facility] [place]

## 2023-07-20 NOTE — Progress Notes (Signed)
COVID Vaccine Completed: yes  Date of COVID positive in last 90 days:  PCP - Inez Pilgrim, MD Cardiologist - Laqueta Carina, MD Electrophysiologist- Loman Brooklyn, MD  Cardiac clearance by Joni Reining by 06/20/23 in Epic   Chest x-ray - 04/02/23 Epic EKG - 05/20/23 Epic Stress Test -  ECHO - 04/03/23 Epic Cardiac Cath -  Pacemaker/ICD device last checked: Spinal Cord Stimulator:  Bowel Prep -   Sleep Study -  CPAP -   Fasting Blood Sugar -  Checks Blood Sugar _____ times a day  Last dose of GLP1 agonist-  N/A GLP1 instructions:  N/A   Last dose of SGLT-2 inhibitors-  N/A SGLT-2 instructions: N/A   Blood Thinner Instructions:  Xarelto, hold 3 days Aspirin Instructions: Last Dose:  Activity level:  Can go up a flight of stairs and perform activities of daily living without stopping and without symptoms of chest pain or shortness of breath.  Able to exercise without symptoms  Unable to go up a flight of stairs without symptoms of     Anesthesia review: HTN, a fib, pericarditis, CHF, anemia  Patient denies shortness of breath, fever, cough and chest pain at PAT appointment  Patient verbalized understanding of instructions that were given to them at the PAT appointment. Patient was also instructed that they will need to review over the PAT instructions again at home before surgery.

## 2023-07-20 NOTE — Patient Instructions (Signed)
SURGICAL WAITING ROOM VISITATION  Patients having surgery or a procedure may have no more than 2 support people in the waiting area - these visitors may rotate.    Children under the age of 62 must have an adult with them who is not the patient.  Due to an increase in RSV and influenza rates and associated hospitalizations, children ages 45 and under may not visit patients in [facility] hospitals.  If the patient needs to stay at the hospital during part of their recovery, the visitor guidelines for inpatient rooms apply. Pre-op nurse will coordinate an appropriate time for 1 support person to accompany patient in pre-op.  This support person may not rotate.    Please refer to the [facility] website for the visitor guidelines for Inpatients (after your surgery is over and you are in a regular room).    Your procedure is scheduled on: 08/01/23   Report to [facility] Main Entrance    Report to admitting at 6:55 AM   Call this number if you have problems the morning of surgery [phone]   Do not eat food :After Midnight.   After Midnight you may have the following liquids until 6:25 AM DAY OF SURGERY  Water Non-Citrus Juices (without pulp, NO RED-Apple, White grape, White cranberry) Black Coffee (NO MILK/CREAM OR CREAMERS, sugar ok)  Clear Tea (NO MILK/CREAM OR CREAMERS, sugar ok) regular and decaf                             Plain Jell-O (NO RED)                                           Fruit ices (not with fruit pulp, NO RED)                                     Popsicles (NO RED)                                                               Sports drinks like Gatorade (NO RED)                 The day of surgery:  Drink ONE (1) Pre-Surgery Clear Ensure at 6:25 AM the morning of surgery. Drink in one sitting. Do not sip.  This drink was given to you during your hospital  pre-op appointment visit. Nothing else to drink after completing the  Pre-Surgery Clear  Ensure.          If you have questions, please contact your surgeon's office.   FOLLOW BOWEL PREP AND ANY ADDITIONAL PRE OP INSTRUCTIONS YOU RECEIVED FROM YOUR SURGEON'S OFFICE!!!     Oral Hygiene is also important to reduce your risk of infection.                                    Remember - BRUSH YOUR TEETH THE MORNING OF SURGERY WITH YOUR REGULAR TOOTHPASTE  DENTURES WILL  BE REMOVED PRIOR TO SURGERY PLEASE DO NOT APPLY "Poly grip" OR ADHESIVES!!!   Stop all vitamins and herbal supplements 7 days before surgery.   Take these medicines the morning of surgery with A SIP OF WATER: Tikosyn, Metoprolol                               You may not have any metal on your body including hair pins, jewelry, and body piercing             Do not wear make-up, lotions, powders, perfumes, or deodorant  Do not wear nail polish including gel and S&S, artificial/acrylic nails, or any other type of covering on natural nails including finger and toenails. If you have artificial nails, gel coating, etc. that needs to be removed by a nail salon please have this removed prior to surgery or surgery may need to be canceled/ delayed if the surgeon/ anesthesia feels like they are unable to be safely monitored.   Do not shave  48 hours prior to surgery.    Do not bring valuables to the hospital. [facility] IS NOT             RESPONSIBLE   FOR VALUABLES.   Contacts, glasses, dentures or bridgework may not be worn into surgery.   Bring small overnight bag day of surgery.   DO NOT BRING YOUR HOME MEDICATIONS TO THE HOSPITAL. PHARMACY WILL DISPENSE MEDICATIONS LISTED ON YOUR MEDICATION LIST TO YOU DURING YOUR ADMISSION IN THE HOSPITAL!   Special Instructions: Bring a copy of your healthcare power of attorney and living will documents the day of surgery if you haven't scanned them before.              Please read over the following fact sheets you were given: IF YOU HAVE QUESTIONS ABOUT YOUR PRE-OP  INSTRUCTIONS PLEASE CALL [name]   If you received a COVID test during your pre-op visit  it is requested that you wear a mask when out in public, stay away from anyone that may not be feeling well and notify your surgeon if you develop symptoms. If you test positive for Covid or have been in contact with anyone that has tested positive in the last 10 days please notify you surgeon.      Pre-operative 5 CHG Bath Instructions   You can play a key role in reducing the risk of infection after surgery. Your skin needs to be as free of germs as possible. You can reduce the number of germs on your skin by washing with CHG (chlorhexidine gluconate) soap before surgery. CHG is an antiseptic soap that kills germs and continues to kill germs even after washing.   DO NOT use if you have an allergy to chlorhexidine/CHG or antibacterial soaps. If your skin becomes reddened or irritated, stop using the CHG and notify one of our RNs at [phone].   Please shower with the CHG soap starting 4 days before surgery using the following schedule:     Please keep in mind the following:  DO NOT shave, including legs and underarms, starting the day of your first shower.   You may shave your face at any point before/day of surgery.  Place clean sheets on your bed the day you start using CHG soap. Use a clean washcloth (not used since being washed) for each shower. DO NOT sleep with pets once you start using the CHG.  CHG Shower Instructions:  If you choose to wash your hair and private area, wash first with your normal shampoo/soap.  After you use shampoo/soap, rinse your hair and body thoroughly to remove shampoo/soap residue.  Turn the water OFF and apply about 3 tablespoons (45 ml) of CHG soap to a CLEAN washcloth.  Apply CHG soap ONLY FROM YOUR NECK DOWN TO YOUR TOES (washing for 3-5 minutes)  DO NOT use CHG soap on face, private areas, open wounds, or sores.  Pay special attention to the  area where your surgery is being performed.  If you are having back surgery, having someone wash your back for you may be helpful. Wait 2 minutes after CHG soap is applied, then you may rinse off the CHG soap.  Pat dry with a clean towel  Put on clean clothes/pajamas   If you choose to wear lotion, please use ONLY the CHG-compatible lotions on the back of this paper.     Additional instructions for the day of surgery: DO NOT APPLY any lotions, deodorants, cologne, or perfumes.   Put on clean/comfortable clothes.  Brush your teeth.  Ask your nurse before applying any prescription medications to the skin.      CHG Compatible Lotions   Aveeno Moisturizing lotion  Cetaphil Moisturizing Cream  Cetaphil Moisturizing Lotion  Clairol Herbal Essence Moisturizing Lotion, Dry Skin  Clairol Herbal Essence Moisturizing Lotion, Extra Dry Skin  Clairol Herbal Essence Moisturizing Lotion, Normal Skin  Curel Age Defying Therapeutic Moisturizing Lotion with Alpha Hydroxy  Curel Extreme Care Body Lotion  Curel Soothing Hands Moisturizing Hand Lotion  Curel Therapeutic Moisturizing Cream, Fragrance-Free  Curel Therapeutic Moisturizing Lotion, Fragrance-Free  Curel Therapeutic Moisturizing Lotion, Original Formula  Eucerin Daily Replenishing Lotion  Eucerin Dry Skin Therapy Plus Alpha Hydroxy Crme  Eucerin Dry Skin Therapy Plus Alpha Hydroxy Lotion  Eucerin Original Crme  Eucerin Original Lotion  Eucerin Plus Crme Eucerin Plus Lotion  Eucerin TriLipid Replenishing Lotion  Keri Anti-Bacterial Hand Lotion  Keri Deep Conditioning Original Lotion Dry Skin Formula Softly Scented  Keri Deep Conditioning Original Lotion, Fragrance Free Sensitive Skin Formula  Keri Lotion Fast Absorbing Fragrance Free Sensitive Skin Formula  Keri Lotion Fast Absorbing Softly Scented Dry Skin Formula  Keri Original Lotion  Keri Skin Renewal Lotion Keri Silky Smooth Lotion  Keri Silky Smooth Sensitive Skin Lotion   Nivea Body Creamy Conditioning Oil  Nivea Body Extra Enriched Lotion  Nivea Body Original Lotion  Nivea Body Sheer Moisturizing Lotion Nivea Crme  Nivea Skin Firming Lotion  NutraDerm 30 Skin Lotion  NutraDerm Skin Lotion  NutraDerm Therapeutic Skin Cream  NutraDerm Therapeutic Skin Lotion  ProShield Protective Hand Cream  Provon moisturizing lotion   Incentive Spirometer  An incentive spirometer is a tool that can help keep your lungs clear and active. This tool measures how well you are filling your lungs with each breath. Taking long deep breaths may help reverse or decrease the chance of developing breathing (pulmonary) problems (especially infection) following: A long period of time when you are unable to move or be active. BEFORE THE PROCEDURE  If the spirometer includes an indicator to show your best effort, your nurse or respiratory therapist will set it to a desired goal. If possible, sit up straight or lean slightly forward. Try not to slouch. Hold the incentive spirometer in an upright position. INSTRUCTIONS FOR USE  Sit on the edge of your bed if possible, or sit up as far as you can in bed or  on a chair. Hold the incentive spirometer in an upright position. Breathe out normally. Place the mouthpiece in your mouth and seal your lips tightly around it. Breathe in slowly and as deeply as possible, raising the piston or the ball toward the top of the column. Hold your breath for 3-5 seconds or for as long as possible. Allow the piston or ball to fall to the bottom of the column. Remove the mouthpiece from your mouth and breathe out normally. Rest for a few seconds and repeat Steps 1 through 7 at least 10 times every 1-2 hours when you are awake. Take your time and take a few normal breaths between deep breaths. The spirometer may include an indicator to show your best effort. Use the indicator as a goal to work toward during each repetition. After each set of 10 deep breaths,  practice coughing to be sure your lungs are clear. If you have an incision (the cut made at the time of surgery), support your incision when coughing by placing a pillow or rolled up towels firmly against it. Once you are able to get out of bed, walk around indoors and cough well. You may stop using the incentive spirometer when instructed by your caregiver.  RISKS AND COMPLICATIONS Take your time so you do not get dizzy or light-headed. If you are in pain, you may need to take or ask for pain medication before doing incentive spirometry. It is harder to take a deep breath if you are having pain. AFTER USE Rest and breathe slowly and easily. It can be helpful to keep track of a log of your progress. Your caregiver can provide you with a simple table to help with this. If you are using the spirometer at home, follow these instructions: SEEK MEDICAL CARE IF:  You are having difficultly using the spirometer. You have trouble using the spirometer as often as instructed. Your pain medication is not giving enough relief while using the spirometer. You develop fever of 100.5 F (38.1 C) or higher. SEEK IMMEDIATE MEDICAL CARE IF:  You cough up bloody sputum that had not been present before. You develop fever of 102 F (38.9 C) or greater. You develop worsening pain at or near the incision site. MAKE SURE YOU:  Understand these instructions. Will watch your condition. Will get help right away if you are not doing well or get worse. Document Released: 02/07/2007 Document Revised: 12/20/2011 Document Reviewed: 04/10/2007 [facility] Patient Information 2014 [place], [place].   ________________________________________________________________________

## 2023-07-21 ENCOUNTER — Encounter (HOSPITAL_COMMUNITY)
Admission: RE | Admit: 2023-07-21 | Discharge: 2023-07-21 | Disposition: A | Discharge status: Home / Self Care | Payer: Medicare PPO | Source: Ambulatory Visit | Attending: Orthopedic Surgery | Admitting: Orthopedic Surgery

## 2023-07-21 ENCOUNTER — Other Ambulatory Visit: Payer: Self-pay

## 2023-07-21 ENCOUNTER — Encounter (HOSPITAL_COMMUNITY): Payer: Self-pay

## 2023-07-21 VITALS — BP 134/64 | HR 63 | Temp 97.7°F | Resp 16 | Ht 62.25 in | Wt 135.0 lb

## 2023-07-21 DIAGNOSIS — I4819 Other persistent atrial fibrillation: Secondary | ICD-10-CM | POA: Insufficient documentation

## 2023-07-21 DIAGNOSIS — Z01812 Encounter for preprocedural laboratory examination: Secondary | ICD-10-CM | POA: Insufficient documentation

## 2023-07-21 DIAGNOSIS — Z01818 Encounter for other preprocedural examination: Secondary | ICD-10-CM

## 2023-07-21 DIAGNOSIS — I4891 Unspecified atrial fibrillation: Secondary | ICD-10-CM

## 2023-07-21 HISTORY — DX: Pneumonia, unspecified organism: J18.9

## 2023-07-21 HISTORY — DX: Other specified disorders of kidney and ureter: N28.89

## 2023-07-21 HISTORY — DX: Heart failure, unspecified: I50.9

## 2023-07-21 HISTORY — DX: Unspecified osteoarthritis, unspecified site: M19.90

## 2023-07-21 LAB — BASIC METABOLIC PANEL
Anion gap: 8 (ref 5–15)
BUN: 15 mg/dL (ref 8–23)
CO2: 27 mmol/L (ref 22–32)
Calcium: 9.4 mg/dL (ref 8.9–10.3)
Chloride: 102 mmol/L (ref 98–111)
Creatinine, Ser: 0.69 mg/dL (ref 0.44–1.00)
GFR, Estimated: >60 mL/min (ref >60)
Glucose, Bld: 103 mg/dL — ABNORMAL HIGH (ref 70–99)
Potassium: 4.2 mmol/L (ref 3.5–5.1)
Sodium: 137 mmol/L (ref 135–145)

## 2023-07-21 LAB — CBC
HCT: 35.6 % — ABNORMAL LOW (ref 36.0–46.0)
Hemoglobin: 11.8 g/dL — ABNORMAL LOW (ref 12.0–15.0)
MCH: 31 pg (ref 26.0–34.0)
MCHC: 33.1 g/dL (ref 30.0–36.0)
MCV: 93.4 fL (ref 80.0–100.0)
Platelets: 220 10*3/uL (ref 150–400)
RBC: 3.81 MIL/uL — ABNORMAL LOW (ref 3.87–5.11)
RDW: 18.1 % — ABNORMAL HIGH (ref 11.5–15.5)
WBC: 6.9 10*3/uL (ref 4.0–10.5)
nRBC: 0 % (ref 0.0–0.2)

## 2023-07-21 LAB — SURGICAL PCR SCREEN
MRSA, PCR: NEGATIVE
Staphylococcus aureus: NEGATIVE

## 2023-07-26 NOTE — Anesthesia Preprocedure Evaluation (Addendum)
Anesthesia Evaluation  Patient identified by MRN, date of birth, ID band Patient awake    Reviewed: Allergy & Precautions, NPO status , Patient's Chart, lab work & pertinent test results  History of Anesthesia Complications Negative for: history of anesthetic complications  Airway Mallampati: II  TM Distance: <3 FB Neck ROM: Full   Comment: Previous grade I view with Glidescope 4, easy mask Dental  (+) Dental Advisory Given   Pulmonary neg pulmonary ROS   Pulmonary exam normal breath sounds clear to auscultation       Cardiovascular hypertension (metoprolol), Pt. on home beta blockers (-) angina +CHF (EF 40-45%)  (-) Past MI, (-) Cardiac Stents and (-) CABG + dysrhythmias (s/p ablation, cardioversion 03/2023) Atrial Fibrillation and Supra Ventricular Tachycardia + Valvular Problems/Murmurs (mild) MR  Rhythm:Regular Rate:Normal  H/o constrictive pericarditis s/p stripping in 2006  TTE 04/03/2023: IMPRESSIONS    1. Left ventricular ejection fraction, by estimation, is 40 to 45%. The  left ventricle has mildly decreased function. The left ventricle  demonstrates global hypokinesis. Left ventricular diastolic parameters are  indeterminate.   2. Right ventricular systolic function is mildly reduced. The right  ventricular size is normal. There is normal pulmonary artery systolic  pressure. The estimated right ventricular systolic pressure is 22.7 mmHg.   3. Left atrial size was mildly dilated.   4. The mitral valve is abnormal. Mild mitral valve regurgitation. No  evidence of mitral stenosis.   5. The aortic valve is tricuspid. There is mild calcification of the  aortic valve. There is mild thickening of the aortic valve. Aortic valve  regurgitation is not visualized. Aortic valve sclerosis is present, with  no evidence of aortic valve stenosis.   6. The inferior vena cava is dilated in size with >50% respiratory  variability,  suggesting right atrial pressure of 8 mmHg.     Neuro/Psych negative neurological ROS     GI/Hepatic negative GI ROS, Neg liver ROS,,,  Endo/Other  negative endocrine ROS    Renal/GU Renal disease (left kidney mass)     Musculoskeletal  (+) Arthritis ,    Abdominal   Peds  Hematology  (+) Blood dyscrasia, anemia Lab Results      Component                Value               Date                      WBC                      6.9                 07/21/2023                HGB                      11.8 (L)            07/21/2023                HCT                      35.6 (L)            07/21/2023                MCV  93.4                07/21/2023                PLT                      220                 07/21/2023              Anesthesia Other Findings Last Xarelto: 07/28/2023  Reproductive/Obstetrics                             Anesthesia Physical Anesthesia Plan  ASA: 3  Anesthesia Plan: MAC, Regional and Spinal   Post-op Pain Management: Tylenol PO (pre-op)* and Regional block*   Induction: Intravenous  PONV Risk Score and Plan: 2 and Ondansetron, Propofol infusion, TIVA and Treatment may vary due to age or medical condition  Airway Management Planned: Natural Airway and Simple Face Mask  Additional Equipment:   Intra-op Plan:   Post-operative Plan: Extubation in OR  Informed Consent: I have reviewed the patients History and Physical, chart, labs and discussed the procedure including the risks, benefits and alternatives for the proposed anesthesia with the patient or authorized representative who has indicated his/her understanding and acceptance.     Dental advisory given  Plan Discussed with: CRNA and Anesthesiologist  Anesthesia Plan Comments: (See PAT note 07/21/2023  Discussed potential risks of nerve blocks including, but not limited to, infection, bleeding, nerve damage, seizures, pneumothorax,  respiratory depression, and potential failure of the block. Alternatives to nerve blocks discussed. All questions answered.  I have discussed risks of neuraxial anesthesia including but not limited to infection, bleeding, nerve injury, back pain, headache, seizures, and failure of block. Patient denies bleeding disorders and is not currently anticoagulated. Labs have been reviewed. Risks and benefits discussed. All patient's questions answered.   Discussed with patient risks of MAC including, but not limited to, minor pain or discomfort, hearing people in the room, and possible need for backup general anesthesia. Risks for general anesthesia also discussed including, but not limited to, sore throat, hoarse voice, chipped/damaged teeth, injury to vocal cords, nausea and vomiting, allergic reactions, lung infection, heart attack, stroke, and death. All questions answered. )       Anesthesia Quick Evaluation

## 2023-07-26 NOTE — Progress Notes (Signed)
Anesthesia Chart Review   Case: [id number] Date/Time: 08/01/23 0910   Procedure: TOTAL KNEE ARTHROPLASTY (Right: Knee)   Anesthesia type: Choice   Pre-op diagnosis: right knee osteoarthritis   Location: [facility] 09 / [facility]   Surgeons: Ollen Gross, MD       DISCUSSION:82 y.o. never smoker with h/o HTN, CHF, atrial fibrillation s/p DCCV 04/05/23, constrictive pericarditis s/p pericardial stripping 2006, chronic diastolic CHF, SVT s/p ablation, right knee OA scheduled for above procedure 08/01/23 with Dr. Ollen Gross.   Per cardiology preoperative evaluation 06/20/2023, "According to the Revised Cardiac Risk Index (RCRI), her Perioperative Risk of Major Cardiac Event is (%): 0.9   Her Functional Capacity in METs is: 5.94 according to the Duke Activity Status Index (DASI).   Per office protocol, patient can hold Xarelto for 3 days prior to procedure.     The patient was advised that if she develops new symptoms prior to surgery to contact our office to arrange for a follow-up visit, and she verbalized understanding.   Therefore, based on ACC/AHA guidelines, patient would be at acceptable risk for the planned procedure without further cardiovascular testing. I will route this recommendation to the requesting party via Epic fax function. "  Pt reports last dose of Eliquis 07/28/2023.  VS: BP 134/64   Pulse 63   Temp 36.5 C (Oral)   Resp 16   Ht 5' 2.25" (1.581 m)   Wt 61.2 kg   SpO2 100%   BMI 24.49 kg/m   PROVIDERS: Inez Pilgrim, NP is PCP   Cardiologist - Laqueta Carina, MD  LABS: Labs reviewed: Acceptable for surgery. (all labs ordered are listed, but only abnormal results are displayed)  Labs Reviewed  BASIC METABOLIC PANEL - Abnormal; Notable for the following components:      Result Value   Glucose, Bld 103 (*)    All other components within normal limits  CBC - Abnormal; Notable for the following components:   RBC 3.81 (*)    Hemoglobin 11.8 (*)    HCT 35.6 (*)     RDW 18.1 (*)    All other components within normal limits  SURGICAL PCR SCREEN     IMAGES:   EKG:   CV: Echo 04/03/2023 1. Left ventricular ejection fraction, by estimation, is 40 to 45%. The  left ventricle has mildly decreased function. The left ventricle  demonstrates global hypokinesis. Left ventricular diastolic parameters are  indeterminate.   2. Right ventricular systolic function is mildly reduced. The right  ventricular size is normal. There is normal pulmonary artery systolic  pressure. The estimated right ventricular systolic pressure is 22.7 mmHg.   3. Left atrial size was mildly dilated.   4. The mitral valve is abnormal. Mild mitral valve regurgitation. No  evidence of mitral stenosis.   5. The aortic valve is tricuspid. There is mild calcification of the  aortic valve. There is mild thickening of the aortic valve. Aortic valve  regurgitation is not visualized. Aortic valve sclerosis is present, with  no evidence of aortic valve stenosis.   6. The inferior vena cava is dilated in size with >50% respiratory  variability, suggesting right atrial pressure of 8 mmHg.   Comparison(s): Changes from prior study are noted. EF decreased slightly  compared to prior.  Past Medical History:  Diagnosis Date   Arthritis    Atrial fibrillation (HCC)    Status post TEE cardioversion   CHF (congestive heart failure) (HCC)    Constrictive pericarditis  2 status post pericardial stripping at the [facility]   Gout    Hypertension    Left kidney mass    Pneumonia    Spastic dysphonia    Supraventricular tachycardia (HCC)    Status post RF ablation by Dr. Anabel Halon    Past Surgical History:  Procedure Laterality Date   ABDOMINAL HYSTERECTOMY     1987   BOWEL RESECTION N/A 06/03/2022   Procedure: SMALL BOWEL RESECTION;  Surgeon: Gaynelle Adu, MD;  Location: [facility] OR;  Service: General;  Laterality: N/A;   CARDIOVERSION  11/21/2012   Procedure: CARDIOVERSION;  Surgeon:  [name], MD;  Location: [facility] ENDOSCOPY;  Service: Cardiovascular;;   CARDIOVERSION N/A 06/10/2014   Procedure: CARDIOVERSION;  Surgeon: Vesta Mixer, MD;  Location: [facility] ENDOSCOPY;  Service: Cardiovascular;  Laterality: N/A;   CARDIOVERSION N/A 10/14/2015   Procedure: CARDIOVERSION;  Surgeon: Wendall Stade, MD;  Location: [facility] ENDOSCOPY;  Service: Cardiovascular;  Laterality: N/A;   CARDIOVERSION N/A 05/12/2016   Procedure: CARDIOVERSION;  Surgeon: Chilton Si, MD;  Location: [facility] ENDOSCOPY;  Service: Cardiovascular;  Laterality: N/A;   CARDIOVERSION N/A 04/09/2022   Procedure: CARDIOVERSION;  Surgeon: Parke Poisson, MD;  Location: [facility] ENDOSCOPY;  Service: Cardiovascular;  Laterality: N/A;   CARDIOVERSION N/A 04/05/2023   Procedure: CARDIOVERSION;  Surgeon: Jodelle Red, MD;  Location: [facility] INVASIVE CV LAB;  Service: Cardiovascular;  Laterality: N/A;   CHOLECYSTECTOMY     1989   ELECTROPHYSIOLOGIC STUDY N/A 08/20/2016   Procedure: Atrial Fibrillation Ablation;  Surgeon: Will Jorja Loa, MD;  Location: [facility] INVASIVE CV LAB;  Service: Cardiovascular;  Laterality: N/A;   EYE SURGERY     cataracts   LAPAROTOMY N/A 06/03/2022   Procedure: EXPLORATORY LAPAROTOMY;  Surgeon: Gaynelle Adu, MD;  Location: [facility] OR;  Service: General;  Laterality: N/A;   PERICARDIECTOMY     TEE WITHOUT CARDIOVERSION N/A 06/10/2014   Procedure: TRANSESOPHAGEAL ECHOCARDIOGRAM (TEE);  Surgeon: Vesta Mixer, MD;  Location: [facility] ENDOSCOPY;  Service: Cardiovascular;  Laterality: N/A;   TONSILLECTOMY      MEDICATIONS:  ascorbic acid (VITAMIN C) 500 MG tablet   Calcium Carb-Cholecalciferol (CALCIUM 600+D) 600-20 MG-MCG TABS   denosumab (PROLIA) 60 MG/ML SOSY injection   dofetilide (TIKOSYN) 250 MCG capsule   metoprolol succinate (TOPROL-XL) 25 MG 24 hr tablet   spironolactone (ALDACTONE) 50 MG tablet   XARELTO 20 MG TABS tablet   No current facility-administered medications for this encounter.      Jodell Cipro Ward, PA-C [facility]urgical [facility] [phone]

## 2023-07-29 NOTE — Plan of Care (Signed)
[facility] Tonsillectomy/Adenoidectomy, Postoperative PEDS care plan entered in error.

## 2023-08-01 ENCOUNTER — Encounter (HOSPITAL_COMMUNITY)
Admission: RE | Disposition: A | Discharge status: Home / Self Care | Payer: Self-pay | Source: Ambulatory Visit | Attending: Orthopedic Surgery

## 2023-08-01 ENCOUNTER — Ambulatory Visit (HOSPITAL_BASED_OUTPATIENT_CLINIC_OR_DEPARTMENT_OTHER): Payer: Medicare PPO | Admitting: Anesthesiology

## 2023-08-01 ENCOUNTER — Inpatient Hospital Stay (HOSPITAL_COMMUNITY)
Admission: RE | Admit: 2023-08-01 | Discharge: 2023-08-03 | DRG: 470 | Disposition: A | Discharge status: Home / Self Care | Payer: Medicare PPO | Source: Ambulatory Visit | Attending: Orthopedic Surgery | Admitting: Orthopedic Surgery

## 2023-08-01 ENCOUNTER — Other Ambulatory Visit: Payer: Self-pay

## 2023-08-01 ENCOUNTER — Ambulatory Visit (HOSPITAL_COMMUNITY): Payer: Medicare PPO | Admitting: Physician Assistant

## 2023-08-01 ENCOUNTER — Encounter (HOSPITAL_COMMUNITY): Payer: Self-pay | Admitting: Orthopedic Surgery

## 2023-08-01 DIAGNOSIS — I110 Hypertensive heart disease with heart failure: Secondary | ICD-10-CM | POA: Diagnosis present

## 2023-08-01 DIAGNOSIS — Z90710 Acquired absence of both cervix and uterus: Secondary | ICD-10-CM

## 2023-08-01 DIAGNOSIS — Z881 Allergy status to other antibiotic agents status: Secondary | ICD-10-CM | POA: Diagnosis not present

## 2023-08-01 DIAGNOSIS — I480 Paroxysmal atrial fibrillation: Secondary | ICD-10-CM | POA: Diagnosis present

## 2023-08-01 DIAGNOSIS — I129 Hypertensive chronic kidney disease with stage 1 through stage 4 chronic kidney disease, or unspecified chronic kidney disease: Secondary | ICD-10-CM | POA: Diagnosis not present

## 2023-08-01 DIAGNOSIS — Z7901 Long term (current) use of anticoagulants: Secondary | ICD-10-CM

## 2023-08-01 DIAGNOSIS — M1711 Unilateral primary osteoarthritis, right knee: Secondary | ICD-10-CM

## 2023-08-01 DIAGNOSIS — Z888 Allergy status to other drugs, medicaments and biological substances status: Secondary | ICD-10-CM | POA: Diagnosis not present

## 2023-08-01 DIAGNOSIS — G8929 Other chronic pain: Secondary | ICD-10-CM | POA: Diagnosis present

## 2023-08-01 DIAGNOSIS — Z8249 Family history of ischemic heart disease and other diseases of the circulatory system: Secondary | ICD-10-CM | POA: Diagnosis not present

## 2023-08-01 DIAGNOSIS — N189 Chronic kidney disease, unspecified: Secondary | ICD-10-CM | POA: Diagnosis not present

## 2023-08-01 DIAGNOSIS — M25761 Osteophyte, right knee: Secondary | ICD-10-CM | POA: Diagnosis present

## 2023-08-01 DIAGNOSIS — I5032 Chronic diastolic (congestive) heart failure: Secondary | ICD-10-CM | POA: Diagnosis present

## 2023-08-01 DIAGNOSIS — M109 Gout, unspecified: Secondary | ICD-10-CM | POA: Diagnosis present

## 2023-08-01 DIAGNOSIS — D6869 Other thrombophilia: Secondary | ICD-10-CM | POA: Diagnosis present

## 2023-08-01 DIAGNOSIS — Z8616 Personal history of COVID-19: Secondary | ICD-10-CM | POA: Diagnosis not present

## 2023-08-01 DIAGNOSIS — Z9049 Acquired absence of other specified parts of digestive tract: Secondary | ICD-10-CM | POA: Diagnosis not present

## 2023-08-01 DIAGNOSIS — G8918 Other acute postprocedural pain: Secondary | ICD-10-CM | POA: Diagnosis not present

## 2023-08-01 DIAGNOSIS — Z887 Allergy status to serum and vaccine status: Secondary | ICD-10-CM | POA: Diagnosis not present

## 2023-08-01 DIAGNOSIS — Z79899 Other long term (current) drug therapy: Secondary | ICD-10-CM

## 2023-08-01 DIAGNOSIS — M179 Osteoarthritis of knee, unspecified: Principal | ICD-10-CM | POA: Diagnosis present

## 2023-08-01 HISTORY — PX: TOTAL KNEE ARTHROPLASTY: SHX125

## 2023-08-01 SURGERY — ARTHROPLASTY, KNEE, TOTAL
Anesthesia: Monitor Anesthesia Care | Site: Knee | Laterality: Right

## 2023-08-01 MED ORDER — BUPIVACAINE IN DEXTROSE 0.75-8.25 % IT SOLN
INTRATHECAL | Status: DC | PRN
  Administered 2023-08-01: 1.6 mL via INTRATHECAL

## 2023-08-01 MED ORDER — POVIDONE-IODINE 10 % EX SWAB: 2.0000 | Freq: Once | CUTANEOUS | Status: DC

## 2023-08-01 MED ORDER — FLEET ENEMA RE ENEM
1.0000 | ENEMA | Freq: Once | RECTAL | Status: DC | PRN
Start: 2023-08-01 — End: 2023-08-03

## 2023-08-01 MED ORDER — PHENOL 1.4 % MT LIQD: 1.0000 | OROMUCOSAL | Status: DC | PRN

## 2023-08-01 MED ORDER — ACETAMINOPHEN 10 MG/ML IV SOLN
1000.0000 mg | Freq: Four times a day (QID) | INTRAVENOUS | Status: DC
  Administered 2023-08-01: 1000 mg via INTRAVENOUS
  Filled 2023-08-01: qty 100

## 2023-08-01 MED ORDER — MENTHOL 3 MG MT LOZG: 1.0000 | LOZENGE | OROMUCOSAL | Status: DC | PRN

## 2023-08-01 MED ORDER — POLYETHYLENE GLYCOL 3350 17 G PO PACK: 17.0000 g | PACK | Freq: Every day | ORAL | Status: DC | PRN

## 2023-08-01 MED ORDER — OXYCODONE HCL 5 MG PO TABS
5.0000 mg | ORAL_TABLET | ORAL | Status: DC | PRN
Start: 2023-08-01 — End: 2023-08-03
  Administered 2023-08-01 – 2023-08-03 (×3): 5 mg via ORAL
  Filled 2023-08-01 (×4): qty 1
  Filled 2023-08-01 (×2): qty 2
  Filled 2023-08-01 (×2): qty 1

## 2023-08-01 MED ORDER — PROPOFOL 1000 MG/100ML IV EMUL
INTRAVENOUS | Status: AC
  Filled 2023-08-01: qty 100

## 2023-08-01 MED ORDER — TRAMADOL HCL 50 MG PO TABS
50.0000 mg | ORAL_TABLET | Freq: Four times a day (QID) | ORAL | Status: DC | PRN
  Administered 2023-08-01: 100 mg via ORAL
  Filled 2023-08-01 (×2): qty 2

## 2023-08-01 MED ORDER — ORAL CARE MOUTH RINSE: 15.0000 mL | OROMUCOSAL | Status: DC | PRN

## 2023-08-01 MED ORDER — LIDOCAINE HCL (CARDIAC) PF 100 MG/5ML IV SOSY
PREFILLED_SYRINGE | INTRAVENOUS | Status: DC | PRN
  Administered 2023-08-01: 40 mg via INTRAVENOUS

## 2023-08-01 MED ORDER — OXYCODONE HCL 5 MG PO TABS
10.0000 mg | ORAL_TABLET | ORAL | Status: DC | PRN
Start: 2023-08-01 — End: 2023-08-03
  Administered 2023-08-02 (×3): 15 mg via ORAL
  Administered 2023-08-03: 10 mg via ORAL
  Filled 2023-08-01: qty 2
  Filled 2023-08-01: qty 3
  Filled 2023-08-01: qty 2

## 2023-08-01 MED ORDER — FENTANYL CITRATE PF 50 MCG/ML IJ SOSY: 25.0000 ug | PREFILLED_SYRINGE | INTRAMUSCULAR | Status: DC | PRN

## 2023-08-01 MED ORDER — METOCLOPRAMIDE HCL 5 MG PO TABS: 5.0000 mg | ORAL_TABLET | Freq: Three times a day (TID) | ORAL | Status: DC | PRN

## 2023-08-01 MED ORDER — PHENYLEPHRINE 80 MCG/ML (10ML) SYRINGE FOR IV PUSH (FOR BLOOD PRESSURE SUPPORT)
PREFILLED_SYRINGE | INTRAVENOUS | Status: AC
  Filled 2023-08-01: qty 10

## 2023-08-01 MED ORDER — PROPOFOL 10 MG/ML IV BOLUS
INTRAVENOUS | Status: DC | PRN
  Administered 2023-08-01: 40 mg via INTRAVENOUS

## 2023-08-01 MED ORDER — CHLORHEXIDINE GLUCONATE 0.12 % MT SOLN
15.0000 mL | Freq: Once | OROMUCOSAL | Status: AC
  Administered 2023-08-01: 15 mL via OROMUCOSAL

## 2023-08-01 MED ORDER — SODIUM CHLORIDE (PF) 0.9 % IJ SOLN
INTRAMUSCULAR | Status: AC
  Filled 2023-08-01: qty 10

## 2023-08-01 MED ORDER — ONDANSETRON HCL 4 MG/2ML IJ SOLN
INTRAMUSCULAR | Status: AC
  Filled 2023-08-01: qty 2

## 2023-08-01 MED ORDER — CEFAZOLIN SODIUM-DEXTROSE 2-4 GM/100ML-% IV SOLN
2.0000 g | Freq: Four times a day (QID) | INTRAVENOUS | Status: AC
  Administered 2023-08-01 (×2): 2 g via INTRAVENOUS
  Filled 2023-08-01 (×2): qty 100

## 2023-08-01 MED ORDER — ONDANSETRON HCL 4 MG/2ML IJ SOLN
INTRAMUSCULAR | Status: DC | PRN
  Administered 2023-08-01: 4 mg via INTRAVENOUS

## 2023-08-01 MED ORDER — LACTATED RINGERS IV SOLN: INTRAVENOUS | Status: DC

## 2023-08-01 MED ORDER — SPIRONOLACTONE 25 MG PO TABS
50.0000 mg | ORAL_TABLET | Freq: Every day | ORAL | Status: DC
  Administered 2023-08-02 – 2023-08-03 (×2): 50 mg via ORAL
  Filled 2023-08-01 (×2): qty 2

## 2023-08-01 MED ORDER — ROPIVACAINE HCL 5 MG/ML IJ SOLN
INTRAMUSCULAR | Status: DC | PRN
  Administered 2023-08-01: 20 mL via PERINEURAL

## 2023-08-01 MED ORDER — FENTANYL CITRATE (PF) 100 MCG/2ML IJ SOLN
INTRAMUSCULAR | Status: DC | PRN
  Administered 2023-08-01 (×4): 25 ug via INTRAVENOUS

## 2023-08-01 MED ORDER — BUPIVACAINE LIPOSOME 1.3 % IJ SUSP: 20.0000 mL | Freq: Once | INTRAMUSCULAR | Status: DC

## 2023-08-01 MED ORDER — METOCLOPRAMIDE HCL 5 MG/ML IJ SOLN
5.0000 mg | Freq: Three times a day (TID) | INTRAMUSCULAR | Status: DC | PRN
  Administered 2023-08-02: 10 mg via INTRAVENOUS
  Filled 2023-08-01: qty 2

## 2023-08-01 MED ORDER — AMISULPRIDE (ANTIEMETIC) 5 MG/2ML IV SOLN: 10.0000 mg | Freq: Once | INTRAVENOUS | Status: DC | PRN

## 2023-08-01 MED ORDER — CEFAZOLIN SODIUM-DEXTROSE 2-4 GM/100ML-% IV SOLN
2.0000 g | INTRAVENOUS | Status: AC
  Administered 2023-08-01: 2 g via INTRAVENOUS
  Filled 2023-08-01: qty 100

## 2023-08-01 MED ORDER — SODIUM CHLORIDE 0.9 % IR SOLN
Status: DC | PRN
  Administered 2023-08-01: 1000 mL

## 2023-08-01 MED ORDER — DOCUSATE SODIUM 100 MG PO CAPS
100.0000 mg | ORAL_CAPSULE | Freq: Two times a day (BID) | ORAL | Status: DC
Start: 2023-08-01 — End: 2023-08-03
  Administered 2023-08-01 – 2023-08-03 (×5): 100 mg via ORAL
  Filled 2023-08-01 (×5): qty 1

## 2023-08-01 MED ORDER — ORAL CARE MOUTH RINSE: 15.0000 mL | Freq: Once | OROMUCOSAL | Status: AC

## 2023-08-01 MED ORDER — FENTANYL CITRATE (PF) 100 MCG/2ML IJ SOLN
INTRAMUSCULAR | Status: AC
  Filled 2023-08-01: qty 2

## 2023-08-01 MED ORDER — RIVAROXABAN 10 MG PO TABS
10.0000 mg | ORAL_TABLET | Freq: Every day | ORAL | Status: DC
  Administered 2023-08-02 – 2023-08-03 (×2): 10 mg via ORAL
  Filled 2023-08-01 (×2): qty 1

## 2023-08-01 MED ORDER — TRANEXAMIC ACID-NACL 1000-0.7 MG/100ML-% IV SOLN
1000.0000 mg | INTRAVENOUS | Status: AC
  Administered 2023-08-01: 1000 mg via INTRAVENOUS
  Filled 2023-08-01: qty 100

## 2023-08-01 MED ORDER — 0.9 % SODIUM CHLORIDE (POUR BTL) OPTIME
TOPICAL | Status: DC | PRN
  Administered 2023-08-01: 1000 mL

## 2023-08-01 MED ORDER — PROPOFOL 500 MG/50ML IV EMUL
INTRAVENOUS | Status: DC | PRN
  Administered 2023-08-01: 50 ug/kg/min via INTRAVENOUS

## 2023-08-01 MED ORDER — METHOCARBAMOL 1000 MG/10ML IJ SOLN: 500.0000 mg | Freq: Four times a day (QID) | INTRAMUSCULAR | Status: DC | PRN

## 2023-08-01 MED ORDER — DIPHENHYDRAMINE HCL 12.5 MG/5ML PO ELIX: 12.5000 mg | ORAL_SOLUTION | ORAL | Status: DC | PRN

## 2023-08-01 MED ORDER — SODIUM CHLORIDE 0.9 % IV SOLN: INTRAVENOUS | Status: DC

## 2023-08-01 MED ORDER — OXYCODONE HCL 5 MG PO TABS: 5.0000 mg | ORAL_TABLET | Freq: Once | ORAL | Status: DC | PRN

## 2023-08-01 MED ORDER — SODIUM CHLORIDE (PF) 0.9 % IJ SOLN
INTRAMUSCULAR | Status: DC | PRN
  Administered 2023-08-01: 60 mL

## 2023-08-01 MED ORDER — PHENYLEPHRINE HCL-NACL 20-0.9 MG/250ML-% IV SOLN
INTRAVENOUS | Status: DC | PRN
  Administered 2023-08-01: 50 ug/min via INTRAVENOUS

## 2023-08-01 MED ORDER — LIDOCAINE HCL (PF) 2 % IJ SOLN
INTRAMUSCULAR | Status: AC
  Filled 2023-08-01: qty 5

## 2023-08-01 MED ORDER — FENTANYL CITRATE PF 50 MCG/ML IJ SOSY
50.0000 ug | PREFILLED_SYRINGE | INTRAMUSCULAR | Status: DC
  Administered 2023-08-01: 50 ug via INTRAVENOUS
  Filled 2023-08-01: qty 2

## 2023-08-01 MED ORDER — ONDANSETRON HCL 4 MG PO TABS
4.0000 mg | ORAL_TABLET | Freq: Four times a day (QID) | ORAL | Status: DC | PRN
  Administered 2023-08-02: 4 mg via ORAL
  Filled 2023-08-01: qty 1

## 2023-08-01 MED ORDER — SODIUM CHLORIDE (PF) 0.9 % IJ SOLN
INTRAMUSCULAR | Status: AC
  Filled 2023-08-01: qty 50

## 2023-08-01 MED ORDER — BISACODYL 10 MG RE SUPP: 10.0000 mg | Freq: Every day | RECTAL | Status: DC | PRN

## 2023-08-01 MED ORDER — METHOCARBAMOL 500 MG PO TABS
500.0000 mg | ORAL_TABLET | Freq: Four times a day (QID) | ORAL | Status: DC | PRN
  Administered 2023-08-01 – 2023-08-02 (×3): 500 mg via ORAL
  Filled 2023-08-01 (×3): qty 1

## 2023-08-01 MED ORDER — ONDANSETRON HCL 4 MG/2ML IJ SOLN
4.0000 mg | Freq: Four times a day (QID) | INTRAMUSCULAR | Status: DC | PRN
  Administered 2023-08-02: 4 mg via INTRAVENOUS
  Filled 2023-08-01: qty 2

## 2023-08-01 MED ORDER — STERILE WATER FOR IRRIGATION IR SOLN
Status: DC | PRN
  Administered 2023-08-01: 1000 mL

## 2023-08-01 MED ORDER — BUPIVACAINE LIPOSOME 1.3 % IJ SUSP
INTRAMUSCULAR | Status: DC | PRN
  Administered 2023-08-01: 20 mL

## 2023-08-01 MED ORDER — OXYCODONE HCL 5 MG/5ML PO SOLN: 5.0000 mg | Freq: Once | ORAL | Status: DC | PRN

## 2023-08-01 MED ORDER — DOFETILIDE 250 MCG PO CAPS
250.0000 ug | ORAL_CAPSULE | Freq: Two times a day (BID) | ORAL | Status: DC
  Administered 2023-08-01 – 2023-08-03 (×4): 250 ug via ORAL
  Filled 2023-08-01 (×4): qty 1

## 2023-08-01 MED ORDER — MORPHINE SULFATE (PF) 2 MG/ML IV SOLN
1.0000 mg | INTRAVENOUS | Status: DC | PRN
  Administered 2023-08-02: 1 mg via INTRAVENOUS
  Filled 2023-08-01: qty 1

## 2023-08-01 MED ORDER — ACETAMINOPHEN 500 MG PO TABS
1000.0000 mg | ORAL_TABLET | Freq: Four times a day (QID) | ORAL | Status: AC
  Administered 2023-08-01 – 2023-08-02 (×2): 1000 mg via ORAL
  Filled 2023-08-01 (×2): qty 2

## 2023-08-01 MED ORDER — BUPIVACAINE LIPOSOME 1.3 % IJ SUSP
INTRAMUSCULAR | Status: AC
  Filled 2023-08-01: qty 20

## 2023-08-01 MED ORDER — DEXAMETHASONE SODIUM PHOSPHATE 10 MG/ML IJ SOLN
INTRAMUSCULAR | Status: AC
  Filled 2023-08-01: qty 1

## 2023-08-01 SURGICAL SUPPLY — 53 items
ADH SKN CLS APL DERMABOND .7 (GAUZE/BANDAGES/DRESSINGS) ×1
ATTUNE PS FEM RT SZ 5 CEM KNEE (Femur) IMPLANT
ATTUNE PSRP INSR SZ 5 10M KNEE (Insert) IMPLANT
BAG COUNTER SPONGE SURGICOUNT (BAG) IMPLANT
BAG SPEC THK2 15X12 ZIP CLS (MISCELLANEOUS) ×1
BAG SPNG CNTER NS LX DISP (BAG)
BAG ZIPLOCK 12X15 (MISCELLANEOUS) ×2 IMPLANT
BASEPLATE TIBIAL ROTATING SZ 4 (Knees) IMPLANT
BLADE SAG 18X100X1.27 (BLADE) ×2 IMPLANT
BLADE SAW SGTL 11.0X1.19X90.0M (BLADE) ×2 IMPLANT
BNDG CMPR 6 X 5 YARDS HK CLSR (GAUZE/BANDAGES/DRESSINGS) ×1
BNDG ELASTIC 6INX 5YD STR LF (GAUZE/BANDAGES/DRESSINGS) ×2 IMPLANT
BOWL SMART MIX CTS (DISPOSABLE) ×2 IMPLANT
BSPLAT TIB 4 CMNT ROT PLAT STR (Knees) ×1 IMPLANT
CEMENT HV SMART SET (Cement) ×4 IMPLANT
COVER SURGICAL LIGHT HANDLE (MISCELLANEOUS) ×2 IMPLANT
CUFF TOURN SGL QUICK 34 (TOURNIQUET CUFF) ×1
CUFF TRNQT CYL 34X4.125X (TOURNIQUET CUFF) ×2 IMPLANT
DERMABOND ADVANCED .7 DNX12 (GAUZE/BANDAGES/DRESSINGS) ×2 IMPLANT
DRAPE U-SHAPE 47X51 STRL (DRAPES) ×2 IMPLANT
DRSG AQUACEL AG ADV 3.5X10 (GAUZE/BANDAGES/DRESSINGS) ×2 IMPLANT
DURAPREP 26ML APPLICATOR (WOUND CARE) ×2 IMPLANT
ELECT REM PT RETURN 15FT ADLT (MISCELLANEOUS) ×2 IMPLANT
GLOVE BIO SURGEON STRL SZ 6.5 (GLOVE) IMPLANT
GLOVE BIO SURGEON STRL SZ8 (GLOVE) ×2 IMPLANT
GLOVE BIOGEL PI IND STRL 6.5 (GLOVE) IMPLANT
GLOVE BIOGEL PI IND STRL 7.0 (GLOVE) IMPLANT
GLOVE BIOGEL PI IND STRL 8 (GLOVE) ×2 IMPLANT
GOWN STRL REUS W/ TWL LRG LVL3 (GOWN DISPOSABLE) ×2 IMPLANT
GOWN STRL REUS W/TWL LRG LVL3 (GOWN DISPOSABLE) ×1
HANDPIECE INTERPULSE COAX TIP (DISPOSABLE) ×1
HOLDER FOLEY CATH W/STRAP (MISCELLANEOUS) IMPLANT
IMMOBILIZER KNEE 20 (SOFTGOODS) ×1
IMMOBILIZER KNEE 20 THIGH 36 (SOFTGOODS) ×2 IMPLANT
KIT TURNOVER KIT A (KITS) IMPLANT
MANIFOLD NEPTUNE II (INSTRUMENTS) ×2 IMPLANT
NS IRRIG 1000ML POUR BTL (IV SOLUTION) ×2 IMPLANT
PACK TOTAL KNEE CUSTOM (KITS) ×2 IMPLANT
PADDING CAST COTTON 6X4 STRL (CAST SUPPLIES) ×4 IMPLANT
PATELLA MEDIAL ATTUN 35MM KNEE (Knees) IMPLANT
PIN STEINMAN FIXATION KNEE (PIN) IMPLANT
PROTECTOR NERVE ULNAR (MISCELLANEOUS) ×2 IMPLANT
SET HNDPC FAN SPRY TIP SCT (DISPOSABLE) ×2 IMPLANT
SUT MNCRL AB 4-0 PS2 18 (SUTURE) ×2 IMPLANT
SUT STRATAFIX 0 PDS 27 VIOLET (SUTURE) ×1
SUT VIC AB 2-0 CT1 27 (SUTURE) ×3
SUT VIC AB 2-0 CT1 TAPERPNT 27 (SUTURE) ×6 IMPLANT
SUTURE STRATFX 0 PDS 27 VIOLET (SUTURE) ×2 IMPLANT
TRAY FOLEY MTR SLVR 14FR STAT (SET/KITS/TRAYS/PACK) IMPLANT
TRAY FOLEY MTR SLVR 16FR STAT (SET/KITS/TRAYS/PACK) IMPLANT
TUBE SUCTION HIGH CAP CLEAR NV (SUCTIONS) ×2 IMPLANT
WATER STERILE IRR 1000ML POUR (IV SOLUTION) ×4 IMPLANT
WRAP KNEE MAXI GEL POST OP (GAUZE/BANDAGES/DRESSINGS) ×2 IMPLANT

## 2023-08-01 NOTE — Op Note (Signed)
OPERATIVE REPORT-TOTAL KNEE ARTHROPLASTY   Pre-operative diagnosis- Osteoarthritis  Right knee(s)  Post-operative diagnosis- Osteoarthritis Right knee(s)  Procedure-  Right  Total Knee Arthroplasty  Surgeon- Gus Rankin. Marrissa Dai, MD  Assistant- Arther Abbott, PA-C   Anesthesia-   Adductor canal block and spinal  EBL-25 ml   Drains None  Tourniquet time-  Total Tourniquet Time Documented: Thigh (Right) - 27 minutes Total: Thigh (Right) - 27 minutes     Complications- None  Condition-PACU - hemodynamically stable.   Brief Clinical Note  Yolanda Bell is a 82 y.o. year old female with end stage OA of her right knee with progressively worsening pain and dysfunction. She has constant pain, with activity and at rest and significant functional deficits with difficulties even with ADLs. She has had extensive non-op management including analgesics, injections of cortisone and viscosupplements, and home exercise program, but remains in significant pain with significant dysfunction.Radiographs show bone on bone arthritis medial and patellofemoral. She presents now for right Total Knee Arthroplasty.     Procedure in detail---   The patient is brought into the operating room and positioned supine on the operating table. After successful administration of  Adductor canal block and spinal,   a tourniquet is placed high on the  Right thigh(s) and the lower extremity is prepped and draped in the usual sterile fashion. Time out is performed by the operating team and then the  Right lower extremity is wrapped in Esmarch, knee flexed and the tourniquet inflated to 300 mmHg.       A midline incision is made with a ten blade through the subcutaneous tissue to the level of the extensor mechanism. A fresh blade is used to make a medial parapatellar arthrotomy. Soft tissue over the proximal medial tibia is subperiosteally elevated to the joint line with a knife and into the semimembranosus bursa with a  Cobb elevator. Soft tissue over the proximal lateral tibia is elevated with attention being paid to avoiding the patellar tendon on the tibial tubercle. The patella is everted, knee flexed 90 degrees and the ACL and PCL are removed. Findings are bone on bone medial and patellofemoral with large global osteophytes        The drill is used to create a starting hole in the distal femur and the canal is thoroughly irrigated with sterile saline to remove the fatty contents. The 5 degree Right  valgus alignment guide is placed into the femoral canal and the distal femoral cutting block is pinned to remove 9 mm off the distal femur. Resection is made with an oscillating saw.      The tibia is subluxed forward and the menisci are removed. The extramedullary alignment guide is placed referencing proximally at the medial aspect of the tibial tubercle and distally along the second metatarsal axis and tibial crest. The block is pinned to remove 2mm off the more deficient medial  side. Resection is made with an oscillating saw. Size 4is the most appropriate size for the tibia and the proximal tibia is prepared with the modular drill and keel punch for that size.      The femoral sizing guide is placed and size 5 is most appropriate. Rotation is marked off the epicondylar axis and confirmed by creating a rectangular flexion gap at 90 degrees. The size 5 cutting block is pinned in this rotation and the anterior, posterior and chamfer cuts are made with the oscillating saw. The intercondylar block is then placed and that cut is made.  Trial size 4 tibial component, trial size 5 posterior stabilized femur and a 10  mm posterior stabilized rotating platform insert trial is placed. Full extension is achieved with excellent varus/valgus and anterior/posterior balance throughout full range of motion. The patella is everted and thickness measured to be 22  mm. Free hand resection is taken to 12 mm, a 35 template is placed, lug  holes are drilled, trial patella is placed, and it tracks normally. Osteophytes are removed off the posterior femur with the trial in place. All trials are removed and the cut bone surfaces prepared with pulsatile lavage. Cement is mixed and once ready for implantation, the size 4 tibial implant, size  5 posterior stabilized femoral component, and the size 35 patella are cemented in place and the patella is held with the clamp. The trial insert is placed and the knee held in full extension. The Exparel (20 ml mixed with 60 ml saline) is injected into the extensor mechanism, posterior capsule, medial and lateral gutters and subcutaneous tissues.  All extruded cement is removed and once the cement is hard the permanent 10 mm posterior stabilized rotating platform insert is placed into the tibial tray.      The wound is copiously irrigated with saline solution and the extensor mechanism closed with # 0 Stratofix suture. The tourniquet is released for a total tourniquet time of 27  minutes. Flexion against gravity is 140 degrees and the patella tracks normally. Subcutaneous tissue is closed with 2.0 vicryl and subcuticular with running 4.0 Monocryl. The incision is cleaned and dried and steri-strips and a bulky sterile dressing are applied. The limb is placed into a knee immobilizer and the patient is awakened and transported to recovery in stable condition.      Please note that a surgical assistant was a medical necessity for this procedure in order to perform it in a safe and expeditious manner. Surgical assistant was necessary to retract the ligaments and vital neurovascular structures to prevent injury to them and also necessary for proper positioning of the limb to allow for anatomic placement of the prosthesis.   Gus Rankin Quanah Majka, MD    08/01/2023, 10:41 AM

## 2023-08-01 NOTE — Care Plan (Signed)
Ortho Bundle Case Management Note  Patient Details  Name: Yolanda Bell MRN: [id number] Date of Birth: Apr 02, 1941                  R TKA on 08/01/23.  DCP: Home with son DOS, then will have hired help for 3 nights and neighbors checking in on her.  DME: No needs. Has RW and cane. PT: EO with Kathlene November   DME Arranged:  N/A DME Agency:       Additional Comments: Please contact me with any questions of if this plan should need to change.    Despina Pole, CCM Case Manager, [facility]ec[facility] Chute  [phone] 08/01/2023, 2:29 PM

## 2023-08-01 NOTE — Interval H&P Note (Signed)
History and Physical Interval Note:  08/01/2023 6:34 AM  Yolanda Bell  has presented today for surgery, with the diagnosis of right knee osteoarthritis.  The various methods of treatment have been discussed with the patient and family. After consideration of risks, benefits and other options for treatment, the patient has consented to  Procedure(s): TOTAL KNEE ARTHROPLASTY (Right) as a surgical intervention.  The patient's history has been reviewed, patient examined, no change in status, stable for surgery.  I have reviewed the patient's chart and labs.  Questions were answered to the patient's satisfaction.     Homero Fellers Dori Devino

## 2023-08-01 NOTE — Transfer of Care (Signed)
Immediate Anesthesia Transfer of Care Note  Patient: Yolanda Bell  Procedure(s) Performed: TOTAL KNEE ARTHROPLASTY (Right: Knee)  Patient Location: PACU  Anesthesia Type:Spinal and MAC combined with regional for post-op pain  Level of Consciousness: awake, drowsy, and patient cooperative  Airway & Oxygen Therapy: Patient Spontanous Breathing and Patient connected to face mask oxygen  Post-op Assessment: Report given to RN and Post -op Vital signs reviewed and stable  Post vital signs: Reviewed and stable  Last Vitals:  Vitals Value Taken Time  BP 108/54 08/01/23 1104  Temp    Pulse 50 08/01/23 1105  Resp 11 08/01/23 1105  SpO2 100 % 08/01/23 1105  Vitals shown include unfiled device data.  Last Pain:  Vitals:   08/01/23 0915  PainSc: 0-No pain      Patients Stated Pain Goal: 4 (08/01/23 0718)  Complications: No notable events documented.

## 2023-08-01 NOTE — Anesthesia Procedure Notes (Signed)
Anesthesia Regional Block: Adductor canal block   Pre-Anesthetic Checklist: , timeout performed,  Correct Patient, Correct Site, Correct Laterality,  Correct Procedure, Correct Position, site marked,  Risks and benefits discussed,  Surgical consent,  Pre-op evaluation,  At surgeon's request and post-op pain management  Laterality: Right  Prep: chloraprep       Needles:  Injection technique: Single-shot  Needle Type: Echogenic Stimulator Needle     Needle Length: 9cm  Needle Gauge: 21     Additional Needles:   Procedures:,,,, ultrasound used (permanent image in chart),,    Narrative:  Start time: 08/01/2023 8:35 AM End time: 08/01/2023 8:38 AM Injection made incrementally with aspirations every 5 mL.  Performed by: Personally  Anesthesiologist: Linton Rump, MD  Additional Notes: Discussed risks and benefits of nerve block including, but not limited to, prolonged and/or permanent nerve injury involving sensory and/or motor function. Monitors were applied and a time-out was performed. The nerve and associated structures were visualized under ultrasound guidance. After negative aspiration, local anesthetic was slowly injected around the nerve. There was no evidence of high pressure during the procedure. There were no paresthesias. VSS remained stable and the patient tolerated the procedure well.

## 2023-08-01 NOTE — Anesthesia Postprocedure Evaluation (Signed)
Anesthesia Post Note  Patient: Yolanda Bell  Procedure(s) Performed: TOTAL KNEE ARTHROPLASTY (Right: Knee)     Patient location during evaluation: PACU Anesthesia Type: Regional, MAC and Spinal Level of consciousness: awake Pain management: pain level controlled Vital Signs Assessment: post-procedure vital signs reviewed and stable Respiratory status: spontaneous breathing, respiratory function stable and nonlabored ventilation Cardiovascular status: blood pressure returned to baseline and stable Postop Assessment: no headache, no backache and no apparent nausea or vomiting Anesthetic complications: no   No notable events documented.  Last Vitals:  Vitals:   08/01/23 1245 08/01/23 1456  BP: (!) 133/39 (!) 122/46  Pulse: (!) 47 (!) 52  Resp: 18 15  Temp: 36.4 C 36.4 C  SpO2: 100% 95%    Last Pain:  Vitals:   08/01/23 1337  TempSrc:   PainSc: 5                  Linton Rump

## 2023-08-01 NOTE — Progress Notes (Signed)
Orthopedic Tech Progress Note Patient Details:  [name] Mar 02, 1941 [id number]  CPM Right Knee CPM Right Knee: On Right Knee Flexion (Degrees): 40 Right Knee Extension (Degrees): 10  Post Interventions Patient Tolerated: Well Instructions Provided: Adjustment of device, Care of device  Kizzie Fantasia 08/01/2023, 11:26 AM

## 2023-08-01 NOTE — Anesthesia Procedure Notes (Signed)
Spinal  Patient location during procedure: OR Start time: 08/01/2023 9:45 AM End time: 08/01/2023 9:47 AM Reason for block: surgical anesthesia Staffing Performed: anesthesiologist  Anesthesiologist: Linton Rump, MD Performed by: Linton Rump, MD Authorized by: Linton Rump, MD   Preanesthetic Checklist Completed: patient identified, IV checked, site marked, risks and benefits discussed, surgical consent, monitors and equipment checked, pre-op evaluation and timeout performed Spinal Block Patient position: sitting Prep: DuraPrep Patient monitoring: blood pressure and continuous pulse ox Approach: midline Location: L3-4 Injection technique: single-shot Needle Needle type: Pencan  Needle gauge: 24 G Needle length: 9 cm Additional Notes Risks and benefits of neuraxial anesthesia including, but not limited to, infection, bleeding, local anesthetic toxicity, headache, hypotension, back pain, block failure, etc. were discussed with the patient. The patient expressed understanding and consented to the procedure. I confirmed that the patient has no bleeding disorders and is not taking blood thinners. I confirmed the patient's last platelet count with the nurse. Monitors were applied. A time-out was performed immediately prior to the procedure. Sterile technique was used throughout the whole procedure.   1 attempt(s)

## 2023-08-01 NOTE — Evaluation (Signed)
Physical Therapy Evaluation Patient Details Name: Yolanda Bell MRN: [id number] DOB: Jan 17, 1941 Today's Date: 08/01/2023  History of Present Illness  82 y.o. female admitted 08/01/23 for R TKA. PMH: afib, GIB, gout, HTN, bowel resection 05/2022.  Clinical Impression  Pt is s/p TKA resulting in the deficits listed below (see PT Problem List). Pt ambulated 14' with RW, distance limited by nausea/dizziness, pt assisted to recliner where BP was 110/47, HR 60, SpO2 94%. Good progress expected. Pt will benefit from acute skilled PT to increase their independence and safety with mobility to allow discharge.          If plan is discharge home, recommend the following: A little help with bathing/dressing/bathroom;Help with stairs or ramp for entrance;Assist for transportation   Can travel by private vehicle        Equipment Recommendations None recommended by PT  Recommendations for Other Services       Functional Status Assessment Patient has had a recent decline in their functional status and demonstrates the ability to make significant improvements in function in a reasonable and predictable amount of time.     Precautions / Restrictions        Mobility  Bed Mobility Overal bed mobility: Modified Independent             General bed mobility comments: HOB up, used rail    Transfers Overall transfer level: Needs assistance Equipment used: Rolling walker (2 wheels) Transfers: Sit to/from Stand Sit to Stand: Contact guard assist           General transfer comment: VCs hand placement    Ambulation/Gait Ambulation/Gait assistance: Contact guard assist Gait Distance (Feet): 14 Feet Assistive device: Rolling walker (2 wheels) Gait Pattern/deviations: Step-to pattern, Decreased step length - right, Decreased step length - left Gait velocity: decr     General Gait Details: steady, no loss of balance, VCs sequencing, distance limited by nausea/dizziness, assisted pt to  recliner where BP was 110/47, HR 60, SpO2 94% on room air; pt reported nausea/dizziness resolved after sitting for a couple minutes  Stairs            Wheelchair Mobility     Tilt Bed    Modified Rankin (Stroke Patients Only)       Balance Overall balance assessment: Modified Independent                                           Pertinent Vitals/Pain Pain Assessment Pain Assessment: 0-10 Pain Score: 4  Pain Location: R knee and foot Pain Descriptors / Indicators: Sore Pain Intervention(s): Limited activity within patient's tolerance, Monitored during session, Premedicated before session, Repositioned    Home Living Family/patient expects to be discharged to:: (P) Private residence Living Arrangements: (P) Alone Available Help at Discharge: (P) Family;Friend(s) Type of Home: (P) House Home Access: (P) Stairs to enter   Entrance Stairs-Number of Steps: (P) 1   Home Layout: (P) One level Home Equipment: (P) Rolling Walker (2 wheels);Cane - single point;Grab bars - tub/shower Additional Comments: (P) friends and son to assist prn    Prior Function Prior Level of Function : (P) Independent/Modified Independent             Mobility Comments: (P) cane when going out       Extremity/Trunk Assessment   Upper Extremity Assessment Upper Extremity Assessment: Overall WFL for tasks assessed  Lower Extremity Assessment Lower Extremity Assessment: RLE deficits/detail RLE Deficits / Details: SLR 3/5 RLE Sensation: WNL RLE Coordination: WNL    Cervical / Trunk Assessment Cervical / Trunk Assessment: Normal  Communication   Communication Communication: No apparent difficulties  Cognition Arousal: Alert Behavior During Therapy: WFL for tasks assessed/performed Overall Cognitive Status: Within Functional Limits for tasks assessed                                          General Comments      Exercises Total Joint  Exercises Ankle Circles/Pumps: PROM, Both, 10 reps, Supine Long Arc Quad: AROM, Right, 5 reps, Seated Goniometric ROM: 5-60* AAROM R knee   Assessment/Plan    PT Assessment Patient needs continued PT services  PT Problem List Decreased strength;Decreased range of motion;Decreased activity tolerance;Decreased mobility;Pain       PT Treatment Interventions DME instruction;Gait training;Functional mobility training;Therapeutic activities;Therapeutic exercise;Balance training;Patient/family education    PT Goals (Current goals can be found in the Care Plan section)  Acute Rehab PT Goals Patient Stated Goal: to be able to travel, walk farther PT Goal Formulation: With patient/family Time For Goal Achievement: 08/08/23 Potential to Achieve Goals: Good    Frequency 7X/week     Co-evaluation               AM-PAC PT "6 Clicks" Mobility  Outcome Measure Help needed turning from your back to your side while in a flat bed without using bedrails?: None Help needed moving from lying on your back to sitting on the side of a flat bed without using bedrails?: A Little Help needed moving to and from a bed to a chair (including a wheelchair)?: A Little Help needed standing up from a chair using your arms (e.g., wheelchair or bedside chair)?: A Little Help needed to walk in hospital room?: A Little Help needed climbing 3-5 steps with a railing? : A Lot 6 Click Score: 18    End of Session Equipment Utilized During Treatment: Gait belt Activity Tolerance: Treatment limited secondary to medical complications (Comment) (dizziness, nausea) Patient left: in chair;with call bell/phone within reach;with family/visitor present Nurse Communication: Mobility status PT Visit Diagnosis: Difficulty in walking, not elsewhere classified (R26.2)    Time: 2956-2130 PT Time Calculation (min) (ACUTE ONLY): 28 min   Charges:   PT Evaluation $PT Eval Moderate Complexity: 1 Mod PT Treatments $Gait  Training: 8-22 mins PT General Charges $$ ACUTE PT VISIT: 1 Visit        Tamala Ser PT 08/01/2023  Acute Rehabilitation Services  Office [phone]

## 2023-08-01 NOTE — Discharge Instructions (Signed)
Yolanda Gross, MD Total Joint Specialist [facility] [place] [address], [address] [place], [place] [address] [phone]  TOTAL KNEE REPLACEMENT POSTOPERATIVE DIRECTIONS    Knee Rehabilitation, Guidelines Following Surgery  Results after knee surgery are often greatly improved when you follow the exercise, range of motion and muscle strengthening exercises prescribed by your doctor. Safety measures are also important to protect the knee from further injury. If any of these exercises cause you to have increased pain or swelling in your knee joint, decrease the amount until you are comfortable again and slowly increase them. If you have problems or questions, call your caregiver or physical therapist for advice.   BLOOD CLOT PREVENTION Resume your regular dose of Xarelto upon discharge from the hospital.  You may resume your vitamins/supplements upon discharge from the hospital.  Do not take any NSAIDs (Advil, Aleve, Ibuprofen, Meloxicam, etc.) while taking Xarelto.    HOME CARE INSTRUCTIONS  Remove items at home which could result in a fall. This includes throw rugs or furniture in walking pathways.  ICE to the affected knee as much as tolerated. Icing helps control swelling. If the swelling is well controlled you will be more comfortable and rehab easier. Continue to use ice on the knee for pain and swelling from surgery. You may notice swelling that will progress down to the foot and ankle. This is normal after surgery. Elevate the leg when you are not up walking on it.    Continue to use the breathing machine which will help keep your temperature down. It is common for your temperature to cycle up and down following surgery, especially at night when you are not up moving around and exerting yourself. The breathing machine keeps your lungs expanded and your temperature down. Do not place pillow under the operative knee, focus on keeping the knee straight while  resting  DIET You may resume your previous home diet once you are discharged from the hospital.  DRESSING / WOUND CARE / SHOWERING Keep your bulky bandage on for 2 days. On the third post-operative day you may remove the Ace bandage and gauze. There is a waterproof adhesive bandage on your skin which will stay in place until your first follow-up appointment. Once you remove this you will not need to place another bandage You may begin showering 3 days following surgery, but do not submerge the incision under water.  ACTIVITY For the first 5 days, the key is rest and control of pain and swelling Do your home exercises twice a day starting on post-operative day 3. On the days you go to physical therapy, just do the home exercises once that day. You should rest, ice and elevate the leg for 50 minutes out of every hour. Get up and walk/stretch for 10 minutes per hour. After 5 days you can increase your activity slowly as tolerated. Walk with your walker as instructed. Use the walker until you are comfortable transitioning to a cane. Walk with the cane in the opposite hand of the operative leg. You may discontinue the cane once you are comfortable and walking steadily. Avoid periods of inactivity such as sitting longer than an hour when not asleep. This helps prevent blood clots.  You may discontinue the knee immobilizer once you are able to perform a straight leg raise while lying down. You may resume a sexual relationship in one month or when given the OK by your doctor.  You may return to work once you are cleared by  your doctor.  Do not drive a car for 6 weeks or until released by your surgeon.  Do not drive while taking narcotics.  TED HOSE STOCKINGS Wear the elastic stockings on both legs for three weeks following surgery during the day. You may remove them at night for sleeping.  WEIGHT BEARING Weight bearing as tolerated with assist device (walker, cane, etc) as directed, use it as long  as suggested by your surgeon or therapist, typically at least 4-6 weeks.  POSTOPERATIVE CONSTIPATION PROTOCOL Constipation - defined medically as fewer than three stools per week and severe constipation as less than one stool per week.  One of the most common issues patients have following surgery is constipation.  Even if you have a regular bowel pattern at home, your normal regimen is likely to be disrupted due to multiple reasons following surgery.  Combination of anesthesia, postoperative narcotics, change in appetite and fluid intake all can affect your bowels.  In order to avoid complications following surgery, here are some recommendations in order to help you during your recovery period.  Colace (docusate) - Pick up an over-the-counter form of Colace or another stool softener and take twice a day as long as you are requiring postoperative pain medications.  Take with a full glass of water daily.  If you experience loose stools or diarrhea, hold the colace until you stool forms back up. If your symptoms do not get better within 1 week or if they get worse, check with your doctor. Dulcolax (bisacodyl) - Pick up over-the-counter and take as directed by the product packaging as needed to assist with the movement of your bowels.  Take with a full glass of water.  Use this product as needed if not relieved by Colace only.  MiraLax (polyethylene glycol) - Pick up over-the-counter to have on hand. MiraLax is a solution that will increase the amount of water in your bowels to assist with bowel movements.  Take as directed and can mix with a glass of water, juice, soda, coffee, or tea. Take if you go more than two days without a movement. Do not use MiraLax more than once per day. Call your doctor if you are still constipated or irregular after using this medication for 7 days in a row.  If you continue to have problems with postoperative constipation, please contact the office for further assistance and  recommendations.  If you experience "the worst abdominal pain ever" or develop nausea or vomiting, please contact the office immediatly for further recommendations for treatment.  ITCHING If you experience itching with your medications, try taking only a single pain pill, or even half a pain pill at a time.  You can also use Benadryl over the counter for itching or also to help with sleep.   MEDICATIONS See your medication summary on the "After Visit Summary" that the nursing staff will review with you prior to discharge.  You may have some home medications which will be placed on hold until you complete the course of blood thinner medication.  It is important for you to complete the blood thinner medication as prescribed by your surgeon.  Continue your approved medications as instructed at time of discharge.  PRECAUTIONS If you experience chest pain or shortness of breath - call 911 immediately for transfer to the hospital emergency department.  If you develop a fever greater that 101 F, purulent drainage from wound, increased redness or drainage from wound, foul odor from the wound/dressing, or calf pain -  CONTACT YOUR SURGEON.                                                   FOLLOW-UP APPOINTMENTS Make sure you keep all of your appointments after your operation with your surgeon and caregivers. You should call the office at the above phone number and make an appointment for approximately two weeks after the date of your surgery or on the date instructed by your surgeon outlined in the "After Visit Summary".  RANGE OF MOTION AND STRENGTHENING EXERCISES  Rehabilitation of the knee is important following a knee injury or an operation. After just a few days of immobilization, the muscles of the thigh which control the knee become weakened and shrink (atrophy). Knee exercises are designed to build up the tone and strength of the thigh muscles and to improve knee motion. Often times heat used for  twenty to thirty minutes before working out will loosen up your tissues and help with improving the range of motion but do not use heat for the first two weeks following surgery. These exercises can be done on a training (exercise) mat, on the floor, on a table or on a bed. Use what ever works the best and is most comfortable for you Knee exercises include:  Leg Lifts - While your knee is still immobilized in a splint or cast, you can do straight leg raises. Lift the leg to 60 degrees, hold for 3 sec, and slowly lower the leg. Repeat 10-20 times 2-3 times daily. Perform this exercise against resistance later as your knee gets better.  Quad and Hamstring Sets - Tighten up the muscle on the front of the thigh (Quad) and hold for 5-10 sec. Repeat this 10-20 times hourly. Hamstring sets are done by pushing the foot backward against an object and holding for 5-10 sec. Repeat as with quad sets.  Leg Slides: Lying on your back, slowly slide your foot toward your buttocks, bending your knee up off the floor (only go as far as is comfortable). Then slowly slide your foot back down until your leg is flat on the floor again. Angel Wings: Lying on your back spread your legs to the side as far apart as you can without causing discomfort.  A rehabilitation program following serious knee injuries can speed recovery and prevent re-injury in the future due to weakened muscles. Contact your doctor or a physical therapist for more information on knee rehabilitation.   POST-OPERATIVE OPIOID TAPER INSTRUCTIONS: It is important to wean off of your opioid medication as soon as possible. If you do not need pain medication after your surgery it is ok to stop day one. Opioids include: Codeine, Hydrocodone(Norco, Vicodin), Oxycodone(Percocet, oxycontin) and hydromorphone amongst others.  Long term and even short term use of opiods can cause: Increased pain response Dependence Constipation Depression Respiratory depression And  more.  Withdrawal symptoms can include Flu like symptoms Nausea, vomiting And more Techniques to manage these symptoms Hydrate well Eat regular healthy meals Stay active Use relaxation techniques(deep breathing, meditating, yoga) Do Not substitute Alcohol to help with tapering If you have been on opioids for less than two weeks and do not have pain than it is ok to stop all together.  Plan to wean off of opioids This plan should start within one week post op of your joint replacement. Maintain  the same interval or time between taking each dose and first decrease the dose.  Cut the total daily intake of opioids by one tablet each day Next start to increase the time between doses. The last dose that should be eliminated is the evening dose.   IF YOU ARE TRANSFERRED TO A SKILLED REHAB FACILITY If the patient is transferred to a skilled rehab facility following release from the hospital, a list of the current medications will be sent to the facility for the patient to continue.  When discharged from the skilled rehab facility, please have the facility set up the patient's Home Health Physical Therapy prior to being released. Also, the skilled facility will be responsible for providing the patient with their medications at time of release from the facility to include their pain medication, the muscle relaxants, and their blood thinner medication. If the patient is still at the rehab facility at time of the two week follow up appointment, the skilled rehab facility will also need to assist the patient in arranging follow up appointment in our office and any transportation needs.  MAKE SURE YOU:  Understand these instructions.  Get help right away if you are not doing well or get worse.   DENTAL ANTIBIOTICS:  In most cases prophylactic antibiotics for Dental procdeures after total joint surgery are not necessary.  Exceptions are as follows:  1. History of prior total joint infection  2.  Severely immunocompromised (Organ Transplant, cancer chemotherapy, Rheumatoid biologic meds such as Humera)  3. Poorly controlled diabetes (A1C &gt; 8.0, blood glucose over 200)  If you have one of these conditions, contact your surgeon for an antibiotic prescription, prior to your dental procedure.    Pick up stool softner and laxative for home use following surgery while on pain medications. Do not submerge incision under water. Please use good hand washing techniques while changing dressing each day. May shower starting three days after surgery. Please use a clean towel to pat the incision dry following showers. Continue to use ice for pain and swelling after surgery. Do not use any lotions or creams on the incision until instructed by your surgeon.

## 2023-08-02 ENCOUNTER — Encounter (HOSPITAL_COMMUNITY): Payer: Self-pay | Admitting: Orthopedic Surgery

## 2023-08-02 LAB — BASIC METABOLIC PANEL
Anion gap: 7 (ref 5–15)
BUN: 11 mg/dL (ref 8–23)
CO2: 26 mmol/L (ref 22–32)
Calcium: 8.2 mg/dL — ABNORMAL LOW (ref 8.9–10.3)
Chloride: 100 mmol/L (ref 98–111)
Creatinine, Ser: 0.5 mg/dL (ref 0.44–1.00)
GFR, Estimated: >60 mL/min (ref >60)
Glucose, Bld: 130 mg/dL — ABNORMAL HIGH (ref 70–99)
Potassium: 4.1 mmol/L (ref 3.5–5.1)
Sodium: 133 mmol/L — ABNORMAL LOW (ref 135–145)

## 2023-08-02 LAB — CBC
HCT: 22.6 % — ABNORMAL LOW (ref 36.0–46.0)
HCT: 24.7 % — ABNORMAL LOW (ref 36.0–46.0)
Hemoglobin: 7.6 g/dL — ABNORMAL LOW (ref 12.0–15.0)
Hemoglobin: 8.3 g/dL — ABNORMAL LOW (ref 12.0–15.0)
MCH: 31.7 pg (ref 26.0–34.0)
MCH: 31.9 pg (ref 26.0–34.0)
MCHC: 33.6 g/dL (ref 30.0–36.0)
MCHC: 33.6 g/dL (ref 30.0–36.0)
MCV: 94.2 fL (ref 80.0–100.0)
MCV: 95 fL (ref 80.0–100.0)
Platelets: 179 10*3/uL (ref 150–400)
Platelets: 214 10*3/uL (ref 150–400)
RBC: 2.4 MIL/uL — ABNORMAL LOW (ref 3.87–5.11)
RBC: 2.6 MIL/uL — ABNORMAL LOW (ref 3.87–5.11)
RDW: 17.6 % — ABNORMAL HIGH (ref 11.5–15.5)
RDW: 17.8 % — ABNORMAL HIGH (ref 11.5–15.5)
WBC: 10.2 10*3/uL (ref 4.0–10.5)
WBC: 15.8 10*3/uL — ABNORMAL HIGH (ref 4.0–10.5)
nRBC: 0.3 % — ABNORMAL HIGH (ref 0.0–0.2)
nRBC: 0.3 % — ABNORMAL HIGH (ref 0.0–0.2)

## 2023-08-02 MED ORDER — TRAMADOL HCL 50 MG PO TABS: 50.0000 mg | ORAL_TABLET | Freq: Four times a day (QID) | ORAL | 0 refills | Status: DC | PRN

## 2023-08-02 MED ORDER — OXYCODONE HCL 5 MG PO TABS: 5.0000 mg | ORAL_TABLET | Freq: Four times a day (QID) | ORAL | 0 refills | Status: DC | PRN

## 2023-08-02 MED ORDER — ONDANSETRON HCL 4 MG PO TABS: 4.0000 mg | ORAL_TABLET | Freq: Four times a day (QID) | ORAL | 0 refills | Status: DC | PRN

## 2023-08-02 MED ORDER — METHOCARBAMOL 500 MG PO TABS: 500.0000 mg | ORAL_TABLET | Freq: Four times a day (QID) | ORAL | 0 refills | Status: DC | PRN

## 2023-08-02 MED ORDER — TRAMADOL HCL 50 MG PO TABS
50.0000 mg | ORAL_TABLET | Freq: Four times a day (QID) | ORAL | Status: DC | PRN
  Filled 2023-08-02: qty 2

## 2023-08-02 NOTE — TOC Transition Note (Signed)
Transition of Care [facility]) - CM/SW Discharge Note   Patient Details  Name: Yolanda Bell MRN: [id number] Date of Birth: 02/14/1941  [facility] CM/SW Contact:  Amada Jupiter, LCSW Phone Number: 08/02/2023, 9:49 AM   Clinical Narrative:     Met with pt and confirming she has needed DME in the home.  OPPT already arranged with [facility].  No further TOC needs.  Final next level of care: OP Rehab Barriers to Discharge: No Barriers Identified   Patient Goals and CMS Choice      Discharge Placement                         Discharge Plan and Services Additional resources added to the After Visit Summary for                  DME Arranged: N/A                    Social Determinants of Health (SDOH) Interventions SDOH Screenings   Food Insecurity: No Food Insecurity (08/01/2023)  Housing: Low Risk  (08/01/2023)  Transportation Needs: No Transportation Needs (08/01/2023)  Utilities: Not At Risk (08/01/2023)  Tobacco Use: Low Risk  (08/01/2023)     Readmission Risk Interventions     No data to display

## 2023-08-02 NOTE — Progress Notes (Signed)
Physical Therapy Treatment Patient Details Name: Yolanda Bell MRN: [id number] DOB: 1940/10/29 Today's Date: 08/02/2023   History of Present Illness 82 y.o. female admitted 08/01/23 for R TKA. PMH: afib, GIB, gout, HTN, bowel resection 05/2022.    PT Comments  Pt limited today by nausea, lethargy, and "wooziness."  She had a small drop in BP with standing (<20 mmhg systolic).  Pt falling asleep once in recliner - reports only 1.5 hr sleep in last 2 days.  She ambulated short distance in room.  Will f/u in afternoon.    If plan is discharge home, recommend the following: A little help with bathing/dressing/bathroom;Help with stairs or ramp for entrance;Assist for transportation;A little help with walking and/or transfers   Can travel by private vehicle        Equipment Recommendations  None recommended by PT    Recommendations for Other Services       Precautions / Restrictions Precautions Precautions: Fall;Knee Restrictions Weight Bearing Restrictions: No RLE Weight Bearing: Weight bearing as tolerated     Mobility  Bed Mobility Overal bed mobility: Needs Assistance Bed Mobility: Supine to Sit     Supine to sit: Min assist     General bed mobility comments: Light min A for R LE    Transfers Overall transfer level: Needs assistance Equipment used: Rolling walker (2 wheels) Transfers: Sit to/from Stand Sit to Stand: Contact guard assist           General transfer comment: Performed from bed and BSC; good hand placement    Ambulation/Gait Ambulation/Gait assistance: Contact guard assist Gait Distance (Feet): 6 Feet (6'x2) Assistive device: Rolling walker (2 wheels) Gait Pattern/deviations: Step-to pattern, Decreased stride length, Decreased weight shift to right Gait velocity: decreased     General Gait Details: 6'x2 to [facility] and back to recliner.  Limited distances due to lethargy, "wooziness" , and nausea.   Stairs             Wheelchair  Mobility     Tilt Bed    Modified Rankin (Stroke Patients Only)       Balance Overall balance assessment: Needs assistance Sitting-balance support: No upper extremity supported Sitting balance-Leahy Scale: Good     Standing balance support: Bilateral upper extremity supported, Reliant on assistive device for balance Standing balance-Leahy Scale: Poor                              Cognition Arousal: Lethargic Behavior During Therapy: WFL for tasks assessed/performed Overall Cognitive Status: Within Functional Limits for tasks assessed                                 General Comments: Pt reports very little sleep the past 2 nights and taking pain meds.  She was alert for transfers and ambulation, but once in recliner - having difficulty keeping eyes open for exercises.        Exercises Total Joint Exercises Ankle Circles/Pumps: AROM, Both, 10 reps, Supine Quad Sets: AROM, Both, 10 reps, Supine    General Comments   Noting pt seems to move R side of mouth more than L when smiling and talking.  Does report 1 eye does not open as far.  Son feels that she looks like her normal but just really tired.  No other neurological signs (smooth pursuit intact, no double vision, grip strength equal, ankle DF  equal, no change in sensation).  Noted pt with drop in Hgb from 11.8 to 7.6 and had c/o dizziness yesterday with therapy.  Nurse reports pt "woozy" and nauseated today.  Vitals were monitored during session.  BP as follows: Supine 126/52 Sitting 131/55 Standing 115/50 Sitting post session 118/50  HR 80's to 90's       Pertinent Vitals/Pain Pain Assessment Pain Assessment: 0-10 Pain Score: 4  Pain Location: R knee and foot Pain Descriptors / Indicators: Sore Pain Intervention(s): Limited activity within patient's tolerance, Monitored during session, Premedicated before session, Repositioned, Ice applied    Home Living                           Prior Function            PT Goals (current goals can now be found in the care plan section) Progress towards PT goals: Not progressing toward goals - comment (limited by nausea/lethargy/"woozy")    Frequency    7X/week      PT Plan      Co-evaluation              AM-PAC PT "6 Clicks" Mobility   Outcome Measure  Help needed turning from your back to your side while in a flat bed without using bedrails?: None Help needed moving from lying on your back to sitting on the side of a flat bed without using bedrails?: A Little Help needed moving to and from a bed to a chair (including a wheelchair)?: A Little Help needed standing up from a chair using your arms (e.g., wheelchair or bedside chair)?: A Little Help needed to walk in hospital room?: A Little Help needed climbing 3-5 steps with a railing? : A Lot 6 Click Score: 18    End of Session Equipment Utilized During Treatment: Gait belt Activity Tolerance: Patient limited by lethargy;Treatment limited secondary to medical complications (Comment) Patient left: in chair;with call bell/phone within reach;with family/visitor present Nurse Communication: Mobility status;Other (comment) (Pt still nauseated/"woozy" and small drop in BP) PT Visit Diagnosis: Difficulty in walking, not elsewhere classified (R26.2)     Time: 7829-5621 PT Time Calculation (min) (ACUTE ONLY): 28 min  Charges:    $Gait Training: 8-22 mins $Therapeutic Activity: 8-22 mins PT General Charges $$ ACUTE PT VISIT: 1 Visit                     Anise Salvo, PT Acute Rehab [facility] [phone]    Rayetta Humphrey 08/02/2023, 1:04 PM

## 2023-08-02 NOTE — Progress Notes (Signed)
Subjective: 1 Day Post-Op Procedure(s) (LRB): TOTAL KNEE ARTHROPLASTY (Right) Patient reports pain as mild.   Patient seen in rounds by Dr. Lequita Halt. Patient is doing well other than pain in the right knee, which is improved from yesterday. No acute events overnight. Foley catheter removed this AM. Denies chest pain or SOB.  We will continue therapy today.  Objective: Vital signs in last 24 hours: Temp:  [97.6 F (36.4 C)-99 F (37.2 C)] 98.2 F (36.8 C) (10/22 0616) Pulse Rate:  [43-80] 80 (10/22 0616) Resp:  [12-19] 16 (10/22 0616) BP: (99-159)/(39-75) 139/51 (10/22 0616) SpO2:  [89 %-100 %] 90 % (10/22 0616)  Intake/Output from previous day:  Intake/Output Summary (Last 24 hours) at 08/02/2023 0834 Last data filed at 08/02/2023 0600 Gross per 24 hour  Intake 2317.77 ml  Output 1065 ml  Net 1252.77 ml     Intake/Output this shift: No intake/output data recorded.  Labs: Recent Labs    08/02/23 0339  HGB 7.6*   Recent Labs    08/02/23 0339  WBC 10.2  RBC 2.40*  HCT 22.6*  PLT 179   Recent Labs    08/02/23 0339  NA 133*  K 4.1  CL 100  CO2 26  BUN 11  CREATININE 0.50  GLUCOSE 130*  CALCIUM 8.2*   No results for input(s): "LABPT", "INR" in the last 72 hours.  Exam: General - Patient is Alert and Oriented Extremity - Neurologically intact Neurovascular intact Sensation intact distally Dorsiflexion/Plantar flexion intact Dressing - dressing C/D/I Motor Function - intact, moving foot and toes well on exam.   Past Medical History:  Diagnosis Date   Arthritis    Atrial fibrillation (HCC)    Status post TEE cardioversion   CHF (congestive heart failure) (HCC)    Constrictive pericarditis    2 status post pericardial stripping at the [facility]   Gout    Hypertension    Left kidney mass    Pneumonia    Spastic dysphonia    Supraventricular tachycardia (HCC)    Status post RF ablation by Dr. Anabel Halon    Assessment/Plan: 1 Day Post-Op  Procedure(s) (LRB): TOTAL KNEE ARTHROPLASTY (Right) Principal Problem:   OA (osteoarthritis) of knee Active Problems:   Primary osteoarthritis of right knee  Estimated body mass index is 24.49 kg/m as calculated from the following:   Height as of this encounter: 5' 2.25" (1.581 m).   Weight as of this encounter: 61.2 kg. Advance diet Up with therapy   Patient's anticipated LOS is less than 2 midnights, meeting these requirements: - Lives within 1 hour of care - Has a competent adult at home to recover with post-op recover - NO history of  - Chronic pain requiring opiods  - Diabetes  - Coronary Artery Disease  - Heart failure  - Heart attack  - Stroke  - DVT/VTE  - Respiratory Failure/COPD  - Renal failure  - Anemia  - Advanced Liver disease  DVT Prophylaxis - Xarelto Weight bearing as tolerated. Continue therapy.  Hemoglobin dropped from 11.8 preop to 7.6 this AM. Minimal EBL intra-op, will recheck CBC at 1pm to ensure accuracy.   Plan is to go Home after hospital stay. Possible discharge later today if hgb stable and pain controlled with therapy.   Scheduled for OPPT at [facility] F/u in office in 2 weeks  The PDMP database was reviewed today prior to any opioid medications being prescribed to this patient.  Arther Abbott, PA-C Orthopedic Surgery [phone])  [phone] 08/02/2023, 8:34 AM

## 2023-08-02 NOTE — Progress Notes (Signed)
Physical Therapy Treatment Patient Details Name: Yolanda Bell MRN: [id number] DOB: 1941/03/22 Today's Date: 08/02/2023   History of Present Illness 82 y.o. female admitted 08/01/23 for R TKA. PMH: afib, GIB, gout, HTN, bowel resection 05/2022.    PT Comments  Pt with slight progress but still overall limited by lethargy and nausea.  She has good pain control and quad activation.  Pt with good knee flexion but lacking ~10 degrees ext (left with pillow under lower leg for low load long duration stretch).  Will continue therapy tomorrow.     If plan is discharge home, recommend the following: A little help with bathing/dressing/bathroom;Help with stairs or ramp for entrance;Assist for transportation;A little help with walking and/or transfers   Can travel by private vehicle        Equipment Recommendations  None recommended by PT    Recommendations for Other Services       Precautions / Restrictions Precautions Precautions: Fall;Knee Restrictions Weight Bearing Restrictions: No RLE Weight Bearing: Weight bearing as tolerated     Mobility  Bed Mobility Overal bed mobility: Needs Assistance Bed Mobility: Sit to Supine     Supine to sit: Min assist Sit to supine: Supervision   General bed mobility comments: Light min A for R LE    Transfers Overall transfer level: Needs assistance Equipment used: Rolling walker (2 wheels) Transfers: Sit to/from Stand Sit to Stand: Contact guard assist           General transfer comment: Performed from bed and BSC; good hand placement    Ambulation/Gait Ambulation/Gait assistance: Contact guard assist Gait Distance (Feet): 15 Feet Assistive device: Rolling walker (2 wheels) Gait Pattern/deviations: Step-to pattern, Decreased stride length, Decreased weight shift to right Gait velocity: decreased     General Gait Details: LImited by nausea   Stairs             Wheelchair Mobility     Tilt Bed    Modified Rankin  (Stroke Patients Only)       Balance Overall balance assessment: Needs assistance Sitting-balance support: No upper extremity supported Sitting balance-Leahy Scale: Good     Standing balance support: Bilateral upper extremity supported, Reliant on assistive device for balance Standing balance-Leahy Scale: Poor                              Cognition Arousal: Lethargic Behavior During Therapy: WFL for tasks assessed/performed Overall Cognitive Status: Within Functional Limits for tasks assessed                                 General Comments: Still lethargic, but tolerated therapy        Exercises Total Joint Exercises Ankle Circles/Pumps: AROM, Both, 10 reps, Supine Quad Sets: AROM, Both, 10 reps, Supine Long Arc Quad: AROM, Right, Seated, 10 reps Knee Flexion: AROM, Right, 10 reps, Seated Goniometric ROM: R knee 10 to 80 degre    General Comments        Pertinent Vitals/Pain Pain Assessment Pain Assessment: 0-10 Pain Score: 4  Pain Location: R knee and foot Pain Descriptors / Indicators: Sore Pain Intervention(s): Limited activity within patient's tolerance, Monitored during session, Premedicated before session, Repositioned    Home Living                          Prior Function  PT Goals (current goals can now be found in the care plan section) Progress towards PT goals: Progressing toward goals    Frequency    7X/week      PT Plan      Co-evaluation              AM-PAC PT "6 Clicks" Mobility   Outcome Measure  Help needed turning from your back to your side while in a flat bed without using bedrails?: None Help needed moving from lying on your back to sitting on the side of a flat bed without using bedrails?: A Little Help needed moving to and from a bed to a chair (including a wheelchair)?: A Little Help needed standing up from a chair using your arms (e.g., wheelchair or bedside chair)?: A  Little Help needed to walk in hospital room?: A Little Help needed climbing 3-5 steps with a railing? : A Lot 6 Click Score: 18    End of Session Equipment Utilized During Treatment: Gait belt Activity Tolerance: Patient limited by lethargy;Treatment limited secondary to medical complications (Comment) (nausea) Patient left: with call bell/phone within reach;with family/visitor present;in bed;with bed alarm set;with SCD's reapplied (legs elevated on pillow with knee straigh) Nurse Communication: Mobility status;Other (comment) (Pt still nauseated/"woozy" and small drop in BP) PT Visit Diagnosis: Difficulty in walking, not elsewhere classified (R26.2)     Time: 0865-7846 PT Time Calculation (min) (ACUTE ONLY): 27 min  Charges:    $Gait Training: 8-22 mins $Therapeutic Exercise: 8-22 mins  PT General Charges $$ ACUTE PT VISIT: 1 Visit                     Anise Salvo, PT Acute Rehab Services [facility] [phone]    Rayetta Humphrey 08/02/2023, 2:30 PM

## 2023-08-02 NOTE — Plan of Care (Signed)

## 2023-08-03 DIAGNOSIS — Z888 Allergy status to other drugs, medicaments and biological substances status: Secondary | ICD-10-CM | POA: Diagnosis not present

## 2023-08-03 DIAGNOSIS — M25761 Osteophyte, right knee: Secondary | ICD-10-CM | POA: Diagnosis present

## 2023-08-03 DIAGNOSIS — G8929 Other chronic pain: Secondary | ICD-10-CM | POA: Diagnosis present

## 2023-08-03 DIAGNOSIS — I5032 Chronic diastolic (congestive) heart failure: Secondary | ICD-10-CM | POA: Diagnosis present

## 2023-08-03 DIAGNOSIS — Z8249 Family history of ischemic heart disease and other diseases of the circulatory system: Secondary | ICD-10-CM | POA: Diagnosis not present

## 2023-08-03 DIAGNOSIS — Z79899 Other long term (current) drug therapy: Secondary | ICD-10-CM | POA: Diagnosis not present

## 2023-08-03 DIAGNOSIS — Z887 Allergy status to serum and vaccine status: Secondary | ICD-10-CM | POA: Diagnosis not present

## 2023-08-03 DIAGNOSIS — Z7901 Long term (current) use of anticoagulants: Secondary | ICD-10-CM | POA: Diagnosis not present

## 2023-08-03 DIAGNOSIS — Z90710 Acquired absence of both cervix and uterus: Secondary | ICD-10-CM | POA: Diagnosis not present

## 2023-08-03 DIAGNOSIS — M109 Gout, unspecified: Secondary | ICD-10-CM | POA: Diagnosis present

## 2023-08-03 DIAGNOSIS — M1711 Unilateral primary osteoarthritis, right knee: Secondary | ICD-10-CM | POA: Diagnosis present

## 2023-08-03 DIAGNOSIS — Z881 Allergy status to other antibiotic agents status: Secondary | ICD-10-CM | POA: Diagnosis not present

## 2023-08-03 DIAGNOSIS — I110 Hypertensive heart disease with heart failure: Secondary | ICD-10-CM | POA: Diagnosis present

## 2023-08-03 DIAGNOSIS — Z8616 Personal history of COVID-19: Secondary | ICD-10-CM | POA: Diagnosis not present

## 2023-08-03 DIAGNOSIS — D6869 Other thrombophilia: Secondary | ICD-10-CM | POA: Diagnosis present

## 2023-08-03 DIAGNOSIS — Z9049 Acquired absence of other specified parts of digestive tract: Secondary | ICD-10-CM | POA: Diagnosis not present

## 2023-08-03 DIAGNOSIS — I480 Paroxysmal atrial fibrillation: Secondary | ICD-10-CM | POA: Diagnosis present

## 2023-08-03 LAB — CBC
HCT: 21.2 % — ABNORMAL LOW (ref 36.0–46.0)
HCT: 22.5 % — ABNORMAL LOW (ref 36.0–46.0)
Hemoglobin: 7.1 g/dL — ABNORMAL LOW (ref 12.0–15.0)
Hemoglobin: 7.5 g/dL — ABNORMAL LOW (ref 12.0–15.0)
MCH: 31.4 pg (ref 26.0–34.0)
MCH: 31.1 pg (ref 26.0–34.0)
MCHC: 33.5 g/dL (ref 30.0–36.0)
MCHC: 33.3 g/dL (ref 30.0–36.0)
MCV: 93.8 fL (ref 80.0–100.0)
MCV: 93.4 fL (ref 80.0–100.0)
Platelets: 178 10*3/uL (ref 150–400)
Platelets: 211 10*3/uL (ref 150–400)
RBC: 2.26 MIL/uL — ABNORMAL LOW (ref 3.87–5.11)
RBC: 2.41 MIL/uL — ABNORMAL LOW (ref 3.87–5.11)
RDW: 18 % — ABNORMAL HIGH (ref 11.5–15.5)
RDW: 17.9 % — ABNORMAL HIGH (ref 11.5–15.5)
WBC: 12.7 10*3/uL — ABNORMAL HIGH (ref 4.0–10.5)
WBC: 15.6 10*3/uL — ABNORMAL HIGH (ref 4.0–10.5)
nRBC: 0.5 % — ABNORMAL HIGH (ref 0.0–0.2)
nRBC: 0.8 % — ABNORMAL HIGH (ref 0.0–0.2)

## 2023-08-03 MED ORDER — FERROUS SULFATE 325 (65 FE) MG PO TBEC: 325.0000 mg | DELAYED_RELEASE_TABLET | Freq: Two times a day (BID) | ORAL | 0 refills | Status: AC

## 2023-08-03 NOTE — Progress Notes (Signed)
Physical Therapy Treatment Patient Details Name: Yolanda Bell MRN: [id number] DOB: 03/15/1941 Today's Date: 08/03/2023   History of Present Illness 82 y.o. female admitted 08/01/23 for R TKA. PMH: afib, GIB, gout, HTN, bowel resection 05/2022.    PT Comments  Pt more alert today and reports improved nausea.  She was able to tolerate increased activity - ambulating 6' with RW and CGA.  Will f/u in afternoon for further education on HEP and stair training.      If plan is discharge home, recommend the following: A little help with bathing/dressing/bathroom;Help with stairs or ramp for entrance;Assist for transportation;A little help with walking and/or transfers   Can travel by private vehicle        Equipment Recommendations  None recommended by PT    Recommendations for Other Services       Precautions / Restrictions Precautions Precautions: Fall;Knee Restrictions RLE Weight Bearing: Weight bearing as tolerated     Mobility  Bed Mobility Overal bed mobility: Needs Assistance Bed Mobility: Supine to Sit           General bed mobility comments: Pt self assisting R LE with gait belt    Transfers Overall transfer level: Needs assistance Equipment used: Rolling walker (2 wheels) Transfers: Sit to/from Stand Sit to Stand: Contact guard assist           General transfer comment: Min cues for hand placement    Ambulation/Gait Ambulation/Gait assistance: Contact guard assist Gait Distance (Feet): 50 Feet Assistive device: Rolling walker (2 wheels) Gait Pattern/deviations: Step-to pattern, Decreased stride length, Decreased weight shift to right Gait velocity: decreased     General Gait Details: cues to try to get R foot flat as able   Stairs             Wheelchair Mobility     Tilt Bed    Modified Rankin (Stroke Patients Only)       Balance Overall balance assessment: Needs assistance Sitting-balance support: No upper extremity  supported Sitting balance-Leahy Scale: Good     Standing balance support: Bilateral upper extremity supported, Reliant on assistive device for balance Standing balance-Leahy Scale: Poor                              Cognition Arousal: Alert Behavior During Therapy: WFL for tasks assessed/performed Overall Cognitive Status: Within Functional Limits for tasks assessed                                          Exercises Total Joint Exercises Ankle Circles/Pumps: AROM, Both, 10 reps, Supine Quad Sets: AROM, Both, 10 reps, Supine Long Arc Quad: AROM, Right, Seated, 10 reps Knee Flexion: AROM, Right, 10 reps, Seated    General Comments General comments (skin integrity, edema, etc.): VSS - reports some improvement in nausea      Pertinent Vitals/Pain Pain Assessment Pain Assessment: 0-10 Pain Score: 7  (7/10 in supiine, 5/10 after walking) Pain Location: R knee Pain Descriptors / Indicators: Sore Pain Intervention(s): Limited activity within patient's tolerance, Monitored during session, Premedicated before session, Repositioned, Ice applied    Home Living                          Prior Function  PT Goals (current goals can now be found in the care plan section) Progress towards PT goals: Progressing toward goals    Frequency    7X/week      PT Plan      Co-evaluation              AM-PAC PT "6 Clicks" Mobility   Outcome Measure  Help needed turning from your back to your side while in a flat bed without using bedrails?: None Help needed moving from lying on your back to sitting on the side of a flat bed without using bedrails?: A Little Help needed moving to and from a bed to a chair (including a wheelchair)?: A Little Help needed standing up from a chair using your arms (e.g., wheelchair or bedside chair)?: A Little Help needed to walk in hospital room?: A Little Help needed climbing 3-5 steps with a  railing? : A Lot 6 Click Score: 18    End of Session Equipment Utilized During Treatment: Gait belt Activity Tolerance: Patient tolerated treatment well Patient left: with call bell/phone within reach;with family/visitor present;in chair;with chair alarm set Nurse Communication: Mobility status PT Visit Diagnosis: Difficulty in walking, not elsewhere classified (R26.2);Muscle weakness (generalized) (M62.81)     Time: 1610-9604 PT Time Calculation (min) (ACUTE ONLY): 24 min  Charges:    $Gait Training: 8-22 mins $Therapeutic Exercise: 8-22 mins PT General Charges $$ ACUTE PT VISIT: 1 Visit                     Anise Salvo, PT Acute Rehab [facility] [phone]    Yolanda Bell 08/03/2023, 12:37 PM

## 2023-08-03 NOTE — Plan of Care (Signed)
  Problem: Activity: Goal: Risk for activity intolerance will decrease Outcome: Progressing   Problem: Pain Management: Goal: Pain level will decrease with appropriate interventions Outcome: Progressing   Problem: Clinical Measurements: Goal: Postoperative complications will be avoided or minimized Outcome: Progressing

## 2023-08-03 NOTE — Progress Notes (Signed)
Physical Therapy Treatment Patient Details Name: Yolanda Bell MRN: [id number] DOB: 1941/03/19 Today's Date: 08/03/2023   History of Present Illness 82 y.o. female admitted 08/01/23 for R TKA. PMH: afib, GIB, gout, HTN, bowel resection 05/2022.    PT Comments  Pt is POD # 2 and is progressing well.  She had some mild nausea but reports improved, denied dizziness/lightheadedness and VSS during session.  She was able to perform 1 step similar to home environment and ambulated 40' with RW and supervision. Pt reports has arranged for assist at home (aide staying at night and friends/neighbors/son during day) and is comfortable in returning home and has no further questions for therapy at this time.  Pt demonstrates safe gait & transfers in order to return home from PT perspective once discharged by MD.  While in hospital, will continue to benefit from PT for skilled therapy to advance mobility and exercises.       If plan is discharge home, recommend the following: A little help with bathing/dressing/bathroom;Help with stairs or ramp for entrance;Assist for transportation;A little help with walking and/or transfers   Can travel by private vehicle        Equipment Recommendations  None recommended by PT    Recommendations for Other Services       Precautions / Restrictions Precautions Precautions: Fall;Knee Restrictions RLE Weight Bearing: Weight bearing as tolerated     Mobility  Bed Mobility Overal bed mobility: Needs Assistance Bed Mobility: Supine to Sit, Sit to Supine     Supine to sit: Supervision Sit to supine: Supervision   General bed mobility comments: Pt self assisting R LE with gait belt; does use bed rail    Transfers Overall transfer level: Needs assistance Equipment used: Rolling walker (2 wheels) Transfers: Sit to/from Stand Sit to Stand: Supervision           General transfer comment: good hand placement    Ambulation/Gait Ambulation/Gait assistance:  Supervision Gait Distance (Feet): 50 Feet Assistive device: Rolling walker (2 wheels) Gait Pattern/deviations: Step-to pattern, Decreased stride length, Decreased weight shift to right Gait velocity: decreased     General Gait Details: cues to try to get R foot flat as able; close supervision   Stairs Stairs: Yes Stairs assistance: Contact guard assist Stair Management: With walker   General stair comments: Performed 1 6" step forward with RW and pt with increased pain.  Tried backward with RW up, forward down and pt with good improvement   Wheelchair Mobility     Tilt Bed    Modified Rankin (Stroke Patients Only)       Balance Overall balance assessment: Needs assistance Sitting-balance support: No upper extremity supported Sitting balance-Leahy Scale: Good     Standing balance support: Bilateral upper extremity supported, Reliant on assistive device for balance Standing balance-Leahy Scale: Fair Standing balance comment: RW to ambulate but could static stand without support                            Cognition Arousal: Alert Behavior During Therapy: WFL for tasks assessed/performed Overall Cognitive Status: Within Functional Limits for tasks assessed                                          Exercises Total Joint Exercises Ankle Circles/Pumps: AROM, Both, 10 reps, Supine Quad Sets: AROM, Both, 10  reps, Supine Heel Slides: AAROM, Right, 5 reps, Supine Hip ABduction/ADduction: AAROM, 5 reps, Right, Supine Long Arc Quad: AROM, Right, Seated, 10 reps Knee Flexion: AROM, Right, 10 reps, Seated Goniometric ROM: R knee 8 to 70 degrees Other Exercises Other Exercises: educated on AAROM with gait belt    General Comments General comments (skin integrity, edema, etc.): Noted Hgb up to 7.5 from 7.1.  Pt had some mild nausea but denied lightheadedness/dizziness.  Her BP was 133/55 after ambulation and HR 115bpm.  Pt does have shakey voice  and resting tremor - reports baseline from spastic dysphonia and essential tremor  Educated on safe ice use, no pivots, car transfers, resting with leg straight, and TED hose during day. Also, encouraged walking every 1-2 hours during day. Educated on HEP with focus on mobility the first weeks. Discussed doing exercises within pain control and if pain increasing could decreased ROM, reps, and stop exercises as needed. Encouraged to perform quad sets and ankle pumps frequently for blood flow and to promote full knee extension.       Pertinent Vitals/Pain Pain Assessment Pain Assessment: 0-10 Pain Score: 5  Pain Location: R knee Pain Descriptors / Indicators: Sore Pain Intervention(s): Limited activity within patient's tolerance, Monitored during session, Premedicated before session, Repositioned, Ice applied    Home Living                          Prior Function            PT Goals (current goals can now be found in the care plan section) Progress towards PT goals: Progressing toward goals    Frequency    7X/week      PT Plan      Co-evaluation              AM-PAC PT "6 Clicks" Mobility   Outcome Measure  Help needed turning from your back to your side while in a flat bed without using bedrails?: None Help needed moving from lying on your back to sitting on the side of a flat bed without using bedrails?: A Little Help needed moving to and from a bed to a chair (including a wheelchair)?: A Little Help needed standing up from a chair using your arms (e.g., wheelchair or bedside chair)?: A Little Help needed to walk in hospital room?: A Little Help needed climbing 3-5 steps with a railing? : A Little 6 Click Score: 19    End of Session Equipment Utilized During Treatment: Gait belt Activity Tolerance: Patient tolerated treatment well Patient left: with call bell/phone within reach;with family/visitor present;in bed;with bed alarm set;with SCD's  reapplied Nurse Communication: Mobility status PT Visit Diagnosis: Difficulty in walking, not elsewhere classified (R26.2);Muscle weakness (generalized) (M62.81)     Time: 4098-1191 PT Time Calculation (min) (ACUTE ONLY): 36 min  Charges:    $Gait Training: 8-22 mins $Therapeutic Exercise: 8-22 mins PT General Charges $$ ACUTE PT VISIT: 1 Visit                     Anise Salvo, PT Acute Rehab [facility] [phone]    Rayetta Humphrey 08/03/2023, 4:29 PM

## 2023-08-03 NOTE — Progress Notes (Signed)
   Subjective: 2 Days Post-Op Procedure(s) (LRB): TOTAL KNEE ARTHROPLASTY (Right) Patient seen in rounds by Dr. Lequita Halt. Patient is well, and has had no acute complaints or problems. Denies dizziness or lightheadedness. Denies SOB or chest pain. Patient reports pain as  mild to moderate .    Objective: Vital signs in last 24 hours: Temp:  [97.9 F (36.6 C)-99.9 F (37.7 C)] 99.9 F (37.7 C) (10/23 0424) Pulse Rate:  [74-99] 96 (10/23 0424) Resp:  [16-18] 16 (10/23 0424) BP: (116-141)/(50-78) 122/50 (10/23 0424) SpO2:  [93 %-96 %] 95 % (10/23 0424)  Intake/Output from previous day:  Intake/Output Summary (Last 24 hours) at 08/03/2023 0811 Last data filed at 08/03/2023 0600 Gross per 24 hour  Intake 1800.54 ml  Output 250 ml  Net 1550.54 ml    Intake/Output this shift: No intake/output data recorded.  Labs: Recent Labs    08/02/23 0339 08/02/23 1230 08/03/23 0325  HGB 7.6* 8.3* 7.1*   Recent Labs    08/02/23 1230 08/03/23 0325  WBC 15.8* 12.7*  RBC 2.60* 2.26*  HCT 24.7* 21.2*  PLT 214 178   Recent Labs    08/02/23 0339  NA 133*  K 4.1  CL 100  CO2 26  BUN 11  CREATININE 0.50  GLUCOSE 130*  CALCIUM 8.2*   No results for input(s): "LABPT", "INR" in the last 72 hours.  Exam: General - Patient is Alert and Oriented Extremity - Neurologically intact Neurovascular intact Sensation intact distally Dorsiflexion/Plantar flexion intact Dressing/Incision - clean, dry, no drainage Motor Function - intact, moving foot and toes well on exam.  Past Medical History:  Diagnosis Date   Arthritis    Atrial fibrillation (HCC)    Status post TEE cardioversion   CHF (congestive heart failure) (HCC)    Constrictive pericarditis    2 status post pericardial stripping at the [facility]   Gout    Hypertension    Left kidney mass    Pneumonia    Spastic dysphonia    Supraventricular tachycardia (HCC)    Status post RF ablation by Dr. Anabel Halon     Assessment/Plan: 2 Days Post-Op Procedure(s) (LRB): TOTAL KNEE ARTHROPLASTY (Right) Principal Problem:   OA (osteoarthritis) of knee Active Problems:   Primary osteoarthritis of right knee   Osteoarthritis of right knee  Estimated body mass index is 24.49 kg/m as calculated from the following:   Height as of this encounter: 5' 2.25" (1.581 m).   Weight as of this encounter: 61.2 kg.  DVT Prophylaxis - Xarelto Weight-bearing as tolerated.  Hgb yesterday afternoon 8.3. This morning decreased again to 7.1. Patient remains asymptomatic. Will repeat CBC at 1pm. If stable and patient remains asymptotic, will likely be able to discharge home.  Continue physical therapy today.  R. Arcola Jansky, PA-C Orthopedic Surgery 08/03/2023, 8:11 AM

## 2023-08-04 DIAGNOSIS — M25561 Pain in right knee: Secondary | ICD-10-CM | POA: Diagnosis not present

## 2023-08-04 DIAGNOSIS — M25661 Stiffness of right knee, not elsewhere classified: Secondary | ICD-10-CM | POA: Diagnosis not present

## 2023-08-04 NOTE — Discharge Summary (Signed)
Physician Discharge Summary   Patient ID: Yolanda Bell MRN: [id number] DOB/AGE: 1941/07/25 82 y.o.  Admit date: 08/01/2023 Discharge date: 08/03/2023  Primary Diagnosis: Osteoarthritis right knee   Admission Diagnoses:  Past Medical History:  Diagnosis Date   Arthritis    Atrial fibrillation (HCC)    Status post TEE cardioversion   CHF (congestive heart failure) (HCC)    Constrictive pericarditis    2 status post pericardial stripping at the [facility]   Gout    Hypertension    Left kidney mass    Pneumonia    Spastic dysphonia    Supraventricular tachycardia (HCC)    Status post RF ablation by Dr. Anabel Halon   Discharge Diagnoses:   Principal Problem:   OA (osteoarthritis) of knee Active Problems:   Primary osteoarthritis of right knee   Osteoarthritis of right knee  Estimated body mass index is 24.49 kg/m as calculated from the following:   Height as of this encounter: 5' 2.25" (1.581 m).   Weight as of this encounter: 61.2 kg.  Procedure:  Procedure(s) (LRB): TOTAL KNEE ARTHROPLASTY (Right)   Consults: None  HPI: Yolanda Bell is a 82 y.o. year old female with end stage OA of her right knee with progressively worsening pain and dysfunction. She has constant pain, with activity and at rest and significant functional deficits with difficulties even with ADLs. She has had extensive non-op management including analgesics, injections of cortisone and viscosupplements, and home exercise program, but remains in significant pain with significant dysfunction.Radiographs show bone on bone arthritis medial and patellofemoral. She presents now for right Total Knee Arthroplasty.  Laboratory Data: Admission on 08/01/2023, Discharged on 08/03/2023  Component Date Value Ref Range Status   WBC 08/02/2023 10.2  4.0 - 10.5 K/uL Final   RBC 08/02/2023 2.40 (L)  3.87 - 5.11 MIL/uL Final   Hemoglobin 08/02/2023 7.6 (L)  12.0 - 15.0 g/dL Final   HCT 40/07/2724 22.6 (L)  36.0 - 46.0  % Final   MCV 08/02/2023 94.2  80.0 - 100.0 fL Final   MCH 08/02/2023 31.7  26.0 - 34.0 pg Final   MCHC 08/02/2023 33.6  30.0 - 36.0 g/dL Final   RDW 36/64/4034 17.6 (H)  11.5 - 15.5 % Final   Platelets 08/02/2023 179  150 - 400 K/uL Final   nRBC 08/02/2023 0.3 (H)  0.0 - 0.2 % Final   Performed at [facility], [address], [place], [place] [address]   Sodium 08/02/2023 133 (L)  135 - 145 mmol/L Final   Potassium 08/02/2023 4.1  3.5 - 5.1 mmol/L Final   Chloride 08/02/2023 100  98 - 111 mmol/L Final   CO2 08/02/2023 26  22 - 32 mmol/L Final   Glucose, Bld 08/02/2023 130 (H)  70 - 99 mg/dL Final   Glucose reference range applies only to samples taken after fasting for at least 8 hours.   BUN 08/02/2023 11  8 - 23 mg/dL Final   Creatinine, Ser 08/02/2023 0.50  0.44 - 1.00 mg/dL Final   Calcium 56/38/7564 8.2 (L)  8.9 - 10.3 mg/dL Final   GFR, Estimated 08/02/2023 >60  >60 mL/min Final   Comment: (NOTE) Calculated using the CKD-EPI Creatinine Equation (2021)    Anion gap 08/02/2023 7  5 - 15 Final   Performed at [facility], [address] [place], [place] [address]   WBC 08/02/2023 15.8 (H)  4.0 - 10.5 K/uL Final   RBC 08/02/2023  2.60 (L)  3.87 - 5.11 MIL/uL Final   Hemoglobin 08/02/2023 8.3 (L)  12.0 - 15.0 g/dL Final   HCT 09/81/1914 24.7 (L)  36.0 - 46.0 % Final   MCV 08/02/2023 95.0  80.0 - 100.0 fL Final   MCH 08/02/2023 31.9  26.0 - 34.0 pg Final   MCHC 08/02/2023 33.6  30.0 - 36.0 g/dL Final   RDW 78/29/5621 17.8 (H)  11.5 - 15.5 % Final   Platelets 08/02/2023 214  150 - 400 K/uL Final   nRBC 08/02/2023 0.3 (H)  0.0 - 0.2 % Final   Performed at [facility], [address], [place], [place] [address]   WBC 08/03/2023 12.7 (H)  4.0 - 10.5 K/uL Final   RBC 08/03/2023 2.26 (L)  3.87 - 5.11 MIL/uL Final   Hemoglobin 08/03/2023 7.1 (L)  12.0 - 15.0 g/dL Final   HCT 78/46/9629 21.2 (L)  36.0 - 46.0 % Final   MCV  08/03/2023 93.8  80.0 - 100.0 fL Final   MCH 08/03/2023 31.4  26.0 - 34.0 pg Final   MCHC 08/03/2023 33.5  30.0 - 36.0 g/dL Final   RDW 52/84/1324 18.0 (H)  11.5 - 15.5 % Final   Platelets 08/03/2023 178  150 - 400 K/uL Final   nRBC 08/03/2023 0.5 (H)  0.0 - 0.2 % Final   Performed at [facility], [address] [place], [place] [address]   WBC 08/03/2023 15.6 (H)  4.0 - 10.5 K/uL Final   RBC 08/03/2023 2.41 (L)  3.87 - 5.11 MIL/uL Final   Hemoglobin 08/03/2023 7.5 (L)  12.0 - 15.0 g/dL Final   HCT 72/53/6644 22.5 (L)  36.0 - 46.0 % Final   MCV 08/03/2023 93.4  80.0 - 100.0 fL Final   MCH 08/03/2023 31.1  26.0 - 34.0 pg Final   MCHC 08/03/2023 33.3  30.0 - 36.0 g/dL Final   RDW 03/47/4259 17.9 (H)  11.5 - 15.5 % Final   Platelets 08/03/2023 211  150 - 400 K/uL Final   nRBC 08/03/2023 0.8 (H)  0.0 - 0.2 % Final   Performed at [facility], [address] [place], [place] [address]  Hospital Outpatient Visit on 07/21/2023  Component Date Value Ref Range Status   MRSA, PCR 07/21/2023 NEGATIVE  NEGATIVE Final   Staphylococcus aureus 07/21/2023 NEGATIVE  NEGATIVE Final   Comment: (NOTE) The Xpert SA Assay (FDA approved for NASAL specimens in patients 24 years of age and older), is one component of a comprehensive surveillance program. It is not intended to diagnose infection nor to guide or monitor treatment. Performed at [facility], [address] [place], [place] [address]    Sodium 07/21/2023 137  135 - 145 mmol/L Final   Potassium 07/21/2023 4.2  3.5 - 5.1 mmol/L Final   Chloride 07/21/2023 102  98 - 111 mmol/L Final   CO2 07/21/2023 27  22 - 32 mmol/L Final   Glucose, Bld 07/21/2023 103 (H)  70 - 99 mg/dL Final   Glucose reference range applies only to samples taken after fasting for at least 8 hours.   BUN 07/21/2023 15  8 - 23 mg/dL Final   Creatinine, Ser 07/21/2023 0.69  0.44 - 1.00 mg/dL Final   Calcium  29/51/8841 9.4  8.9 - 10.3 mg/dL Final   GFR, Estimated 07/21/2023 >60  >60 mL/min Final   Comment: (NOTE) Calculated using the CKD-EPI Creatinine Equation (2021)    Anion gap  07/21/2023 8  5 - 15 Final   Performed at [facility], [address], [place], [place] [address]   WBC 07/21/2023 6.9  4.0 - 10.5 K/uL Final   RBC 07/21/2023 3.81 (L)  3.87 - 5.11 MIL/uL Final   Hemoglobin 07/21/2023 11.8 (L)  12.0 - 15.0 g/dL Final   HCT 60/45/4098 35.6 (L)  36.0 - 46.0 % Final   MCV 07/21/2023 93.4  80.0 - 100.0 fL Final   MCH 07/21/2023 31.0  26.0 - 34.0 pg Final   MCHC 07/21/2023 33.1  30.0 - 36.0 g/dL Final   RDW 11/91/4782 18.1 (H)  11.5 - 15.5 % Final   Platelets 07/21/2023 220  150 - 400 K/uL Final   nRBC 07/21/2023 0.0  0.0 - 0.2 % Final   Performed at [facility], [address] [place], [place] [address]     X-Rays:No results found.  EKG: Orders placed or performed in visit on 05/20/23   EKG 12-Lead     Hospital Course: Yolanda Bell is a 82 y.o. who was admitted to [facility]. They were brought to the operating room on 08/01/2023 and underwent Procedure(s): TOTAL KNEE ARTHROPLASTY.  Patient tolerated the procedure well and was later transferred to the recovery room and then to the orthopaedic floor for postoperative care. They were given PO and IV analgesics for pain control following their surgery. They were given 24 hours of postoperative antibiotics of  Anti-infectives (From admission, onward)    Start     Dose/Rate Route Frequency Ordered Stop   08/01/23 1600  ceFAZolin (ANCEF) IVPB 2g/100 mL premix        2 g 200 mL/hr over 30 Minutes Intravenous Every 6 hours 08/01/23 1242 08/02/23 0811   08/01/23 0715  ceFAZolin (ANCEF) IVPB 2g/100 mL premix        2 g 200 mL/hr over 30 Minutes Intravenous On call to O.R. 08/01/23 0703 08/01/23 0948     and started on DVT prophylaxis in the form of Xarelto.   PT and OT were ordered  for total joint protocol. Discharge planning consulted to help with post-op disposition and equipment needs. Patient had a fair night on the evening of surgery. They started to get up OOB with physical therapy on POD #0. Continued to work with physical therapy into POD #2. Patient was seen during rounds on day two and was ready to go home pending progress with physical therapy. Patient worked with physical therapy for a total of 5 sessions and was meeting their goals. Dressing was changed and the incision was C/D/I.  They were discharged home later that day in stable condition.  Diet: Regular diet Activity: WBAT Follow-up: in 2 weeks Disposition: Home Discharged Condition: stable   Discharge Instructions     Call MD / Call 911   Complete by: As directed    If you experience chest pain or shortness of breath, CALL 911 and be transported to the hospital emergency room.  If you develope a fever above 101 F, pus (white drainage) or increased drainage or redness at the wound, or calf pain, call your surgeon's office.   Change dressing   Complete by: As directed    You may remove the bulky bandage (ACE wrap and gauze) two days after surgery. You will have an adhesive waterproof bandage underneath. Leave this in place until your first follow-up appointment.   Constipation Prevention   Complete by: As directed    Drink  plenty of fluids.  Prune juice may be helpful.  You may use a stool softener, such as Colace (over the counter) 100 mg twice a day.  Use MiraLax (over the counter) for constipation as needed.   Diet - low sodium heart healthy   Complete by: As directed    Do not put a pillow under the knee. Place it under the heel.   Complete by: As directed    Driving restrictions   Complete by: As directed    No driving for two weeks   Post-operative opioid taper instructions:   Complete by: As directed    POST-OPERATIVE OPIOID TAPER INSTRUCTIONS: It is important to wean off of your opioid  medication as soon as possible. If you do not need pain medication after your surgery it is ok to stop day one. Opioids include: Codeine, Hydrocodone(Norco, Vicodin), Oxycodone(Percocet, oxycontin) and hydromorphone amongst others.  Long term and even short term use of opiods can cause: Increased pain response Dependence Constipation Depression Respiratory depression And more.  Withdrawal symptoms can include Flu like symptoms Nausea, vomiting And more Techniques to manage these symptoms Hydrate well Eat regular healthy meals Stay active Use relaxation techniques(deep breathing, meditating, yoga) Do Not substitute Alcohol to help with tapering If you have been on opioids for less than two weeks and do not have pain than it is ok to stop all together.  Plan to wean off of opioids This plan should start within one week post op of your joint replacement. Maintain the same interval or time between taking each dose and first decrease the dose.  Cut the total daily intake of opioids by one tablet each day Next start to increase the time between doses. The last dose that should be eliminated is the evening dose.      TED hose   Complete by: As directed    Use stockings (TED hose) for three weeks on both leg(s).  You may remove them at night for sleeping.   Weight bearing as tolerated   Complete by: As directed       Allergies as of 08/03/2023       Reactions   Corticosteroids Other (See Comments)   Liver scarring as a child   Covid-19 (mrna) Vaccine Other (See Comments)   Postural Orthostatic Tachycardia Syndrome (POTS)   Other Other (See Comments)   Cannot take STEROIDS-unknown reaction-caused by internal scarring on liver.    Anesthesia precaution due to excessive liver scarring    Pacerone [amiodarone] Other (See Comments)   Dizziness Visual hallucinations   Erythromycin Nausea And Vomiting, Rash   Zyloprim [allopurinol] Rash, Other (See Comments)   Skin peeling More  severe gout         Medication List     TAKE these medications    ascorbic acid 500 MG tablet Commonly known as: VITAMIN C Take 500 mg by mouth daily.   Calcium 600+D 600-20 MG-MCG Tabs Generic drug: Calcium Carb-Cholecalciferol Take 1 tablet by mouth daily.   denosumab 60 MG/ML Sosy injection Commonly known as: PROLIA Inject 60 mg into the skin every 6 (six) months.   dofetilide 250 MCG capsule Commonly known as: TIKOSYN Take 1 capsule (250 mcg total) by mouth 2 (two) times daily.   ferrous sulfate 325 (65 FE) MG EC tablet Take 1 tablet (325 mg total) by mouth 2 (two) times daily for 21 days.   methocarbamol 500 MG tablet Commonly known as: ROBAXIN Take 1 tablet (500 mg total) by mouth  every 6 (six) hours as needed for muscle spasms.   metoprolol succinate 25 MG 24 hr tablet Commonly known as: TOPROL-XL Take one tablet by mouth (25 mg) twice a day. What changed: additional instructions   ondansetron 4 MG tablet Commonly known as: ZOFRAN Take 1 tablet (4 mg total) by mouth every 6 (six) hours as needed for nausea.   oxyCODONE 5 MG immediate release tablet Commonly known as: Oxy IR/ROXICODONE Take 1-2 tablets (5-10 mg total) by mouth every 6 (six) hours as needed for severe pain (pain score 7-10).   spironolactone 50 MG tablet Commonly known as: ALDACTONE TAKE 1 TABLET BY MOUTH EVERY DAY   traMADol 50 MG tablet Commonly known as: ULTRAM Take 1-2 tablets (50-100 mg total) by mouth every 6 (six) hours as needed for moderate pain (pain score 4-6) (if oxycodone alone not sufficient).   Xarelto 20 MG Tabs tablet Generic drug: rivaroxaban TAKE 1 TABLET BY MOUTH DAILY WITH SUPPER               Discharge Care Instructions  (From admission, onward)           Start     Ordered   08/02/23 0000  Weight bearing as tolerated        08/02/23 0839   08/02/23 0000  Change dressing       Comments: You may remove the bulky bandage (ACE wrap and gauze) two days  after surgery. You will have an adhesive waterproof bandage underneath. Leave this in place until your first follow-up appointment.   08/02/23 1308            Follow-up Information     Ollen Gross, MD. Go on 08/16/2023.   Specialty: Orthopedic Surgery Why: You are scheduled for first post op appt on Tuesday November 5 at 1:30pm. Contact information: [address] [place] [address] [phone]                 Signed: R. Arcola Jansky, PA-C Orthopedic Surgery 08/04/2023, 4:33 PM

## 2023-08-08 DIAGNOSIS — M25661 Stiffness of right knee, not elsewhere classified: Secondary | ICD-10-CM | POA: Diagnosis not present

## 2023-08-08 DIAGNOSIS — Z79899 Other long term (current) drug therapy: Secondary | ICD-10-CM | POA: Diagnosis not present

## 2023-08-08 DIAGNOSIS — M25561 Pain in right knee: Secondary | ICD-10-CM | POA: Diagnosis not present

## 2023-08-10 DIAGNOSIS — M25561 Pain in right knee: Secondary | ICD-10-CM | POA: Diagnosis not present

## 2023-08-10 DIAGNOSIS — M25661 Stiffness of right knee, not elsewhere classified: Secondary | ICD-10-CM | POA: Diagnosis not present

## 2023-08-12 DIAGNOSIS — M25561 Pain in right knee: Secondary | ICD-10-CM | POA: Diagnosis not present

## 2023-08-12 DIAGNOSIS — M25661 Stiffness of right knee, not elsewhere classified: Secondary | ICD-10-CM | POA: Diagnosis not present

## 2023-08-15 DIAGNOSIS — M25661 Stiffness of right knee, not elsewhere classified: Secondary | ICD-10-CM | POA: Diagnosis not present

## 2023-08-15 DIAGNOSIS — M25561 Pain in right knee: Secondary | ICD-10-CM | POA: Diagnosis not present

## 2023-08-19 DIAGNOSIS — M25561 Pain in right knee: Secondary | ICD-10-CM | POA: Diagnosis not present

## 2023-08-19 DIAGNOSIS — M25661 Stiffness of right knee, not elsewhere classified: Secondary | ICD-10-CM | POA: Diagnosis not present

## 2023-08-19 DIAGNOSIS — D649 Anemia, unspecified: Secondary | ICD-10-CM | POA: Diagnosis not present

## 2023-08-23 DIAGNOSIS — M25661 Stiffness of right knee, not elsewhere classified: Secondary | ICD-10-CM | POA: Diagnosis not present

## 2023-08-23 DIAGNOSIS — M25561 Pain in right knee: Secondary | ICD-10-CM | POA: Diagnosis not present

## 2023-08-24 ENCOUNTER — Ambulatory Visit (HOSPITAL_COMMUNITY): Payer: Medicare PPO | Admitting: Internal Medicine

## 2023-08-25 DIAGNOSIS — M25661 Stiffness of right knee, not elsewhere classified: Secondary | ICD-10-CM | POA: Diagnosis not present

## 2023-08-25 DIAGNOSIS — M25561 Pain in right knee: Secondary | ICD-10-CM | POA: Diagnosis not present

## 2023-08-30 DIAGNOSIS — M25561 Pain in right knee: Secondary | ICD-10-CM | POA: Diagnosis not present

## 2023-08-30 DIAGNOSIS — M25661 Stiffness of right knee, not elsewhere classified: Secondary | ICD-10-CM | POA: Diagnosis not present

## 2023-08-30 NOTE — Progress Notes (Signed)
Cardiology Office Note:  .   Date:  08/30/2023  ID:  Toscha, Moosbrugger 11-05-1940, MRN [id number] PCP: Inez Pilgrim, NP   HeartCare Providers Cardiologist:  Kristeen Miss, MD Electrophysiologist:  Regan Lemming, MD {  History of Present Illness: .   Yolanda Bell is a 82 y.o. female w/PMHx of constrictive pericarditis status post pericardial stripping in [facility] 2006,  SVT post ablation by Dr. Graciela Husbands, hypertension, gout, atrial fibrillation/flutter (post PVI ablation 2017)   Saw the Afib clinic post tikosyn load 04/18/23, stable QTc, maintaining SR, no changes were made  Saw B. Veleta Miners, NP 04/18/23, again doing well, some PVCs described as residual, stable EKG, no changes were made   Today's visit is scheduled as a 3 mo visit  ROS:   She is doing well Going to PT, knee remains swollen and tight, ROM not where she would like to be, but sees her surgeon tomorrow. No CP, palpitations or cardiac awareness She does not think she has had Afib since on Tikosyn, very happy about that No SOB, DOE No near syncope or syncope.  She reports that she had labs done a few weeks ago her Hgb up to 9.1 Had a small hematoma R thigh post procedure, she was told from where they put the nerve block. No bleeding or signs of bleeding otherwise  She did take 3-4 doses of zofran post-op, none for weeks  Arrhythmia/AAD hx Tikosyn started June 2024  Studies Reviewed: Marland Kitchen    EKG done today and reviewed by myself:  SR/sinus arrhythmia,  71bpm, QTc    04/03/23: TTE  1. Left ventricular ejection fraction, by estimation, is 40 to 45%. The  left ventricle has mildly decreased function. The left ventricle  demonstrates global hypokinesis. Left ventricular diastolic parameters are  indeterminate.   2. Right ventricular systolic function is mildly reduced. The right  ventricular size is normal. There is normal pulmonary artery systolic  pressure. The estimated right ventricular  systolic pressure is 22.7 mmHg.   3. Left atrial size was mildly dilated.   4. The mitral valve is abnormal. Mild mitral valve regurgitation. No  evidence of mitral stenosis.   5. The aortic valve is tricuspid. There is mild calcification of the  aortic valve. There is mild thickening of the aortic valve. Aortic valve  regurgitation is not visualized. Aortic valve sclerosis is present, with  no evidence of aortic valve stenosis.   6. The inferior vena cava is dilated in size with >50% respiratory  variability, suggesting right atrial pressure of 8 mmHg.   Comparison(s): Changes from prior study are noted. EF decreased slightly  compared to prior.    06/23/2021 TTE IMPRESSIONS   1. Left ventricular ejection fraction, by estimation, is 50 to 55%. The  left ventricle has low normal function. The left ventricle has no regional  wall motion abnormalities. Left ventricular diastolic parameters were  normal.   2. Right ventricular systolic function is normal. The right ventricular  size is normal. There is normal pulmonary artery systolic pressure.   3. Left atrial size was moderately dilated.   4. The mitral valve is normal in structure. Trivial mitral valve  regurgitation. No evidence of mitral stenosis.   5. Tricuspid valve regurgitation is moderate.   6. The aortic valve is tricuspid. Aortic valve regurgitation is not  visualized. No aortic stenosis is present.   7. The inferior vena cava is normal in size with greater than 50%  respiratory variability,  suggesting right atrial pressure of 3 mmHg.      08/20/2016: EPS/ablation CONCLUSIONS: 1. Sinus rhythm upon presentation.   2. Successful electrical isolation and anatomical encircling of all four pulmonary veins with radiofrequency current. 3. No inducible arrhythmias following ablation both on and off of dobutamine 4. No early apparent complications.   Risk Assessment/Calculations:    Physical Exam:   VS:  There were no  vitals taken for this visit.   Wt Readings from Last 3 Encounters:  08/01/23 135 lb (61.2 kg)  07/21/23 135 lb (61.2 kg)  05/20/23 136 lb 6.4 oz (61.9 kg)    GEN: Well nourished, well developed in no acute distress NECK: No JVD; No carotid bruits CARDIAC: RRR, no murmurs, rubs, gallops RESPIRATORY:   CTA b/l without rales, wheezing or rhonchi  ABDOMEN: Soft, non-tender, non-distended EXTREMITIES:  No edema; No deformity    ASSESSMENT AND PLAN: .    paroxysmal AFib Atypical Flutter CHA2DS2Vasc is 4, on Xarelto, appropriately dosed ATach Tikosyn w/stable QTc  Tikosyn teaching re-enforced today, she will dispose of the remaining Zofran once home Labs today  4. CM Suspect 2/2 AFib Update echo now maintaining SR for several months  5. Secondary hypercoagulable state 2/2 AFib      Dispo: back in 64mo, sooner if needed  Signed, Sheilah Pigeon, PA-C

## 2023-09-02 DIAGNOSIS — M25561 Pain in right knee: Secondary | ICD-10-CM | POA: Diagnosis not present

## 2023-09-02 DIAGNOSIS — M25661 Stiffness of right knee, not elsewhere classified: Secondary | ICD-10-CM | POA: Diagnosis not present

## 2023-09-05 ENCOUNTER — Ambulatory Visit: Payer: Medicare PPO | Attending: Physician Assistant | Admitting: Physician Assistant

## 2023-09-05 ENCOUNTER — Encounter: Payer: Self-pay | Admitting: Physician Assistant

## 2023-09-05 VITALS — BP 130/66 | HR 71 | Ht 62.25 in | Wt 132.2 lb

## 2023-09-05 DIAGNOSIS — Z5181 Encounter for therapeutic drug level monitoring: Secondary | ICD-10-CM

## 2023-09-05 DIAGNOSIS — D6869 Other thrombophilia: Secondary | ICD-10-CM | POA: Diagnosis not present

## 2023-09-05 DIAGNOSIS — Z79899 Other long term (current) drug therapy: Secondary | ICD-10-CM | POA: Diagnosis not present

## 2023-09-05 DIAGNOSIS — I480 Paroxysmal atrial fibrillation: Secondary | ICD-10-CM

## 2023-09-05 DIAGNOSIS — I420 Dilated cardiomyopathy: Secondary | ICD-10-CM

## 2023-09-05 DIAGNOSIS — I428 Other cardiomyopathies: Secondary | ICD-10-CM

## 2023-09-05 NOTE — Patient Instructions (Signed)
Medication Instructions:  Your physician recommends that you continue on your current medications as directed. Please refer to the Current Medication list given to you today.  *If you need a refill on your cardiac medications before your next appointment, please call your pharmacy*   Lab Work: TODAY:  BMET, MAG, & CBC  If you have labs (blood work) drawn today and your tests are completely normal, you will receive your results only by: MyChart Message (if you have MyChart) OR A paper copy in the mail If you have any lab test that is abnormal or we need to change your treatment, we will call you to review the results.   Testing/Procedures: Your physician has requested that you have an echocardiogram. Echocardiography is a painless test that uses sound waves to create images of your heart. It provides your doctor with information about the size and shape of your heart and how well your heart's chambers and valves are working. This procedure takes approximately one hour. There are no restrictions for this procedure. Please do NOT wear cologne, perfume, aftershave, or lotions (deodorant is allowed). Please arrive 15 minutes prior to your appointment time.  Please note: We ask at that you not bring children with you during ultrasound (echo/ vascular) testing. Due to room size and safety concerns, children are not allowed in the ultrasound rooms during exams. Our front office staff cannot provide observation of children in our lobby area while testing is being conducted. An adult accompanying a patient to their appointment will only be allowed in the ultrasound room at the discretion of the ultrasound technician under special circumstances. We apologize for any inconvenience.    Follow-Up: At [facility], you and your health needs are our priority.  As part of our continuing mission to provide you with exceptional heart care, we have created designated Provider Care Teams.  These Care  Teams include your primary Cardiologist (physician) and Advanced Practice Providers (APPs -  Physician Assistants and Nurse Practitioners) who all work together to provide you with the care you need, when you need it.  We recommend signing up for the patient portal called "MyChart".  Sign up information is provided on this After Visit Summary.  MyChart is used to connect with patients for Virtual Visits (Telemedicine).  Patients are able to view lab/test results, encounter notes, upcoming appointments, etc.  Non-urgent messages can be sent to your provider as well.   To learn more about what you can do with MyChart, go to [email or web address].    Your next appointment:   4 month(s)  Provider:   You may see Will Jorja Loa, MD or one of the following Advanced Practice Providers on your designated Care Team:   Francis Dowse, [place] [address]" [place], [place] Sherie Don, NP Canary Brim, NP    Other Instructions

## 2023-09-06 DIAGNOSIS — Z5189 Encounter for other specified aftercare: Secondary | ICD-10-CM | POA: Diagnosis not present

## 2023-09-06 LAB — BASIC METABOLIC PANEL
BUN/Creatinine Ratio: 14 (ref 12–28)
BUN: 9 mg/dL (ref 8–27)
CO2: 24 mmol/L (ref 20–29)
Calcium: 9.3 mg/dL (ref 8.7–10.3)
Chloride: 99 mmol/L (ref 96–106)
Creatinine, Ser: 0.63 mg/dL (ref 0.57–1.00)
Glucose: 91 mg/dL (ref 70–99)
Potassium: 4.5 mmol/L (ref 3.5–5.2)
Sodium: 138 mmol/L (ref 134–144)
eGFR: 90 mL/min/{1.73_m2} (ref >59)

## 2023-09-06 LAB — CBC
Hematocrit: 33 % — ABNORMAL LOW (ref 34.0–46.6)
Hemoglobin: 10.4 g/dL — ABNORMAL LOW (ref 11.1–15.9)
MCH: 29.7 pg (ref 26.6–33.0)
MCHC: 31.5 g/dL (ref 31.5–35.7)
MCV: 94 fL (ref 79–97)
Platelets: 257 10*3/uL (ref 150–450)
RBC: 3.5 x10E6/uL — ABNORMAL LOW (ref 3.77–5.28)
RDW: 19.1 % — ABNORMAL HIGH (ref 11.7–15.4)
WBC: 6 10*3/uL (ref 3.4–10.8)

## 2023-09-06 LAB — MAGNESIUM: Magnesium: 1.9 mg/dL (ref 1.6–2.3)

## 2023-09-07 DIAGNOSIS — M25561 Pain in right knee: Secondary | ICD-10-CM | POA: Diagnosis not present

## 2023-09-07 DIAGNOSIS — M25661 Stiffness of right knee, not elsewhere classified: Secondary | ICD-10-CM | POA: Diagnosis not present

## 2023-09-13 DIAGNOSIS — M25561 Pain in right knee: Secondary | ICD-10-CM | POA: Diagnosis not present

## 2023-09-13 DIAGNOSIS — M25661 Stiffness of right knee, not elsewhere classified: Secondary | ICD-10-CM | POA: Diagnosis not present

## 2023-09-15 DIAGNOSIS — M25661 Stiffness of right knee, not elsewhere classified: Secondary | ICD-10-CM | POA: Diagnosis not present

## 2023-09-15 DIAGNOSIS — M25561 Pain in right knee: Secondary | ICD-10-CM | POA: Diagnosis not present

## 2023-09-20 DIAGNOSIS — M25561 Pain in right knee: Secondary | ICD-10-CM | POA: Diagnosis not present

## 2023-09-20 DIAGNOSIS — M25661 Stiffness of right knee, not elsewhere classified: Secondary | ICD-10-CM | POA: Diagnosis not present

## 2023-09-21 DIAGNOSIS — M85851 Other specified disorders of bone density and structure, right thigh: Secondary | ICD-10-CM | POA: Diagnosis not present

## 2023-09-21 DIAGNOSIS — M818 Other osteoporosis without current pathological fracture: Secondary | ICD-10-CM | POA: Diagnosis not present

## 2023-09-23 DIAGNOSIS — M25561 Pain in right knee: Secondary | ICD-10-CM | POA: Diagnosis not present

## 2023-09-23 DIAGNOSIS — M25661 Stiffness of right knee, not elsewhere classified: Secondary | ICD-10-CM | POA: Diagnosis not present

## 2023-09-27 DIAGNOSIS — M25661 Stiffness of right knee, not elsewhere classified: Secondary | ICD-10-CM | POA: Diagnosis not present

## 2023-09-27 DIAGNOSIS — M25561 Pain in right knee: Secondary | ICD-10-CM | POA: Diagnosis not present

## 2023-09-29 DIAGNOSIS — M25561 Pain in right knee: Secondary | ICD-10-CM | POA: Diagnosis not present

## 2023-09-29 DIAGNOSIS — M25661 Stiffness of right knee, not elsewhere classified: Secondary | ICD-10-CM | POA: Diagnosis not present

## 2023-10-03 DIAGNOSIS — M25561 Pain in right knee: Secondary | ICD-10-CM | POA: Diagnosis not present

## 2023-10-03 DIAGNOSIS — M25661 Stiffness of right knee, not elsewhere classified: Secondary | ICD-10-CM | POA: Diagnosis not present

## 2023-10-18 ENCOUNTER — Ambulatory Visit (HOSPITAL_COMMUNITY): Payer: Medicare PPO | Attending: Cardiology

## 2023-10-18 DIAGNOSIS — I480 Paroxysmal atrial fibrillation: Secondary | ICD-10-CM | POA: Diagnosis not present

## 2023-10-18 DIAGNOSIS — I428 Other cardiomyopathies: Secondary | ICD-10-CM | POA: Diagnosis not present

## 2023-10-18 LAB — ECHOCARDIOGRAM COMPLETE
Area-P 1/2: 3.36 cm2
MV M vel: 5.45 m/s
MV Peak grad: 118.8 mm[Hg]
Radius: 0.6 cm
S' Lateral: 2.8 cm

## 2023-10-25 ENCOUNTER — Other Ambulatory Visit (HOSPITAL_COMMUNITY): Payer: Self-pay | Admitting: Internal Medicine

## 2023-12-15 ENCOUNTER — Other Ambulatory Visit: Payer: Self-pay | Admitting: Cardiovascular Disease

## 2023-12-19 DIAGNOSIS — Z961 Presence of intraocular lens: Secondary | ICD-10-CM | POA: Diagnosis not present

## 2023-12-19 DIAGNOSIS — H04223 Epiphora due to insufficient drainage, bilateral lacrimal glands: Secondary | ICD-10-CM | POA: Diagnosis not present

## 2023-12-19 DIAGNOSIS — H5211 Myopia, right eye: Secondary | ICD-10-CM | POA: Diagnosis not present

## 2023-12-26 ENCOUNTER — Encounter: Payer: Self-pay | Admitting: Cardiovascular Disease

## 2023-12-26 NOTE — Progress Notes (Unsigned)
 Cardiology Office Note     ID:  Yolanda Bell, Yolanda Bell, Yolanda Bell, Yolanda Bell  PCP:  Yolanda Pilgrim, NP  Cardiologist:   Yolanda Miss, MD   Chief Complaint  Patient presents with   Atrial Fibrillation   Hypertension        1.  Constrictive Pericarditis 2.  A-Flutter / Fibrillation 3.  Gout 4.  Spastic dysphonia 5. SVT 6. HTN 7.  Diverticulitis 8.  L renal mass - ? Renal cell carcinoma     She has done well from a cardiac standpoint.  She has had a flare up of gout which has affected both feet.   She has been taking indocin for the flare ups.    She had a bad accident in February. She was getting off an airplane and tripped on the off ramp in [place].  She broke both shoulders, left thumb and had soft tissue injury to her left foot.  She was treated conservatively (bilateral arm slings).   Her left thumb fracture was not found until she returned to [place].   She had some tachypalpitations for 22 hours during her recovery.  Her HR was 130-140 for 22 hours and resolved spontaneously.   I suspect she had a brief episode of atrial flutter.  She was quite emotional when she was telling me the issues of her fall. It is  apparently still is quite upsetting to her.  October 19, 2012: She presents today for further evaluation of atrial fib / flutter that was found at cardiac rehab.  She really cannot tell that her HR is fast and irregular.   She can feel her HR when she is lying down.  She is asymptomatic when she is up and busy doing chores.  She denies any chest pain or dyspnea.   Feb. 4, 2014: Yolanda Bell seems to have rapid atrial fibrillation. [name] on relatively high dose of metoprolol XL and also is on when necessary propranolol. He is tolerating the atrial ablation but certainly does not feel as well as she would like to. [name] here to schedule her cardioversion next week. She denies any angina. She does have some shortness of breath.  January 01, 2013:  She has maintained  NSR since her cardioversion ( Feb. 11, 2014) .   She was quite frustrated with her recent Out patient area / endoscopy department  cardioversion.  Lots of computer issues / scheduling issues.    She has done well from a cardiac standpoint.    Sept. 22, 2014:  She has done well.   She had some redness and tenderness just below her right eye.  She is not having any cardiac problems.     January 14, 2014:  She has done well.  She had a brief episode of tachycardia that lasted for several days.    Sounds like an episode of SVT.  She tried some propranolol and eventually resolved after about 5-6 hours.    June 07, 2014:  Yolanda Bell presents with several weeks of palpitations. She came in today and was noted to be in atrial fibrillation. She started back on her xarelto 4 days ago. Her HR is well controlled  Oct. 5, 2015:  Yolanda Bell had a cardioversion August 31.  She had a brief episode of irregular Hr  - she took some propraoolol.  .  She thinks that she eventually converted back to NSR.    In looking back , these were probably PVCs.   May , 10 ,  2016;   Yolanda Bell is a 83 y.o. female who presents for follow up of her atrial fib She was cardioverted last august.   She was found to have a few RBCs in a UA - no obvious blood   Nov. 7, 2016:  Doing well from a cardiac standpoint.  No CP , no dyspnea.   Feb 26, 2016: Feeling well  Needs laser surgery on her right eye.  Doing well.   Feb.  6, 2019: Doing well from a cardiac standpoint.   Has some bleeding folowing oral surgery .  She bled for 4 days.   Only issue is her spastic dysphonia   Feb. 10, 2020: On Jan. 20, 2020, she developed and irregular HR .   took Metoprolol 50 PO .   Felt better after several hours.   Has sinus brady with PACs on ECG  .  Is on Xarelto .  Several days later developed a brief pinching sensation in her chest    November 21, 2019: Yolanda Bell is seen today for follow-up visit.  She has a history of atrial  fibrillation and is status post ablation.  She is on Xarelto. She has been doing well.  She is not had any symptoms of Covid.  She received her second vaccine this past Monday. Has been on torsemide.   She did not respond well to furosemide She is going to a casino in [place] tomorrow. By plane   December 15, 2020 Yolanda Bell is doing well.   Having some knee issues.  No cardiac issues  Is in normal rhythm  Fell during a dog walking accident  And has a compression fx of a vertebra Has healed up now ,  Still having some buttock pain   Jan. 9, 2023 Yolanda Bell is seen for follow up of her atrial fib - s/p ablation. On xarelto She had a vasovagal syncope,  (fell in a chair,  broke the chair, ) following her covid booster in Sept. Fell on her buttocks,  had a large hematoma - Hb fell from 11 to 7.4 Received 2u PRBC. Yolanda Bell was held  Echo shows EF of 50-55%  April 06, 2022: Yolanda Bell is seen today for a working visit.  She called the office yesterday complaining of a fast heart rate.  Heart rates been fast for the last day or so.  She has a history of atrial flutter/atrial fibrillation many years ago.  She has been on  Xarelto 20 mg a day since that time.  Has a hx of Afib ablation in 2017 or 2018 [facility]  She needs to have some oral surgery performed in September.  In the past she sees she has had similar oral surgery and had lots of bleeding complications when her Xarelto was started on the day immediately after her procedure.  I have recommended that we hold her Xarelto for another day or so to allow for the surgical site to heal up before we restart Xarelto.   Sept. 15, 2023 Yolanda Bell is seen today for follow up of her atrial fib  Had COVID Aug. 9 Has had COVID toe and COVID black foot  Went to the ER  Venous duplex looked ok  Then had gout  Then developed diverticulitis  Had surgery  - had partial resection of small bowel  No colostomy  Incision is still open, closing by secondary  intention  The hospitalists held her metoprolol and spiro for several days She has restarted the metoprolol XL  25 mg twice a day  And has restarted the spiro 50 mg a day   CT of the abdomen found a 16 mm nodule in the Left kidney - c/w Renal cell carcinoma   Has missed her appt with [facility] - has been rescheduled  Missed her appt to get tooth pulled. - Ive encouraged her to reschedule it as soon as possible. She has healed up from her abdominal surgery to have this tooth pulled.    December 22, 2022 Yolanda Bell is seen for follow up of her atrial fib Was seen by Otilio Saber in Jan, 2024 MR of the abdomen revealed a mass suspicious for papillary renal cell carcinoma .  Mass is small .  She had her urologist are watching for now  Repeat MRI in may .   Has lost weight  Wt is 135 lbs     December 27, 2023 Yolanda Bell is seen for follow up of her atrial fib, hx of constrictive pericarditis - s/p stripping of her pericardium at [facility]. Possible papillary renal cell carcinoma - followed by urology .  Due for an MRA in May .   Has had right  TKA (Aluisio)  in Oct. 2024  Complicated by a posterior calf injury . Was not able to rehab the knee very well and scar tissue has formed  Was advised to have a closed manipulation which she refused to have the surgery   Doing well from a cardiac standpoint   Check mag and BMP today      Past Medical History:  Diagnosis Date   Arthritis    Atrial fibrillation (HCC)    Status post TEE cardioversion   CHF (congestive heart failure) (HCC)    Constrictive pericarditis    2 status post pericardial stripping at the [facility]   Gout    Hypertension    Left kidney mass    Pneumonia    Spastic dysphonia    Supraventricular tachycardia (HCC)    Status post RF ablation by Dr. Anabel Halon    Past Surgical History:  Procedure Laterality Date   ABDOMINAL HYSTERECTOMY     1987   BOWEL RESECTION N/A 06/03/2022   Procedure: SMALL BOWEL RESECTION;  Surgeon:  Gaynelle Adu, MD;  Location: [facility] OR;  Service: General;  Laterality: N/A;   CARDIOVERSION  11/21/2012   Procedure: CARDIOVERSION;  Surgeon: [name], MD;  Location: [facility] ENDOSCOPY;  Service: Cardiovascular;;   CARDIOVERSION N/A 06/10/2014   Procedure: CARDIOVERSION;  Surgeon: Vesta Mixer, MD;  Location: [facility] ENDOSCOPY;  Service: Cardiovascular;  Laterality: N/A;   CARDIOVERSION N/A 10/14/2015   Procedure: CARDIOVERSION;  Surgeon: Wendall Stade, MD;  Location: [facility] ENDOSCOPY;  Service: Cardiovascular;  Laterality: N/A;   CARDIOVERSION N/A 05/12/2016   Procedure: CARDIOVERSION;  Surgeon: Chilton Si, MD;  Location: [facility] ENDOSCOPY;  Service: Cardiovascular;  Laterality: N/A;   CARDIOVERSION N/A 6/Bell/2023   Procedure: CARDIOVERSION;  Surgeon: Parke Poisson, MD;  Location: [facility] ENDOSCOPY;  Service: Cardiovascular;  Laterality: N/A;   CARDIOVERSION N/A 04/05/2023   Procedure: CARDIOVERSION;  Surgeon: Jodelle Red, MD;  Location: [facility] INVASIVE CV LAB;  Service: Cardiovascular;  Laterality: N/A;   CHOLECYSTECTOMY     1989   ELECTROPHYSIOLOGIC STUDY N/A 08/20/2016   Procedure: Atrial Fibrillation Ablation;  Surgeon: Will Jorja Loa, MD;  Location: [facility] INVASIVE CV LAB;  Service: Cardiovascular;  Laterality: N/A;   EYE SURGERY     cataracts   LAPAROTOMY N/A 06/03/2022   Procedure: EXPLORATORY LAPAROTOMY;  Surgeon:  Gaynelle Adu, MD;  Location: [facility] OR;  Service: General;  Laterality: N/A;   PERICARDIECTOMY     TEE WITHOUT CARDIOVERSION N/A 06/10/2014   Procedure: TRANSESOPHAGEAL ECHOCARDIOGRAM (TEE);  Surgeon: Vesta Mixer, MD;  Location: [facility] ENDOSCOPY;  Service: Cardiovascular;  Laterality: N/A;   TONSILLECTOMY     TOTAL KNEE ARTHROPLASTY Right 08/01/2023   Procedure: TOTAL KNEE ARTHROPLASTY;  Surgeon: Ollen Gross, MD;  Location: [facility] ORS;  Service: Orthopedics;  Laterality: Right;     Current Outpatient Medications  Medication Sig Dispense Refill   ascorbic acid (VITAMIN C) 500  MG tablet Take 500 mg by mouth daily.     Calcium Carb-Cholecalciferol (CALCIUM 600+D) 600-20 MG-MCG TABS Take 1 tablet by mouth daily.     denosumab (PROLIA) 60 MG/ML SOSY injection Inject 60 mg into the skin every 6 (six) months.     dofetilide (TIKOSYN) 250 MCG capsule TAKE 1 CAPSULE BY MOUTH 2 TIMES DAILY. 180 capsule 1   metoprolol succinate (TOPROL-XL) 25 MG 24 hr tablet TAKE ONE TABLET BY MOUTH TWICE A DAY. 180 tablet 0   spironolactone (ALDACTONE) 50 MG tablet TAKE 1 TABLET BY MOUTH EVERY DAY 90 tablet 3   XARELTO 20 MG TABS tablet TAKE 1 TABLET BY MOUTH DAILY WITH SUPPER 90 tablet 1   traMADol (ULTRAM) 50 MG tablet Take 1-2 tablets (50-100 mg total) by mouth every 6 (six) hours as needed for moderate pain (pain score 4-6) (if oxycodone alone not sufficient). 40 tablet 0   No current facility-administered medications for this visit.    Allergies:   Corticosteroids, Covid-19 (mrna) vaccine, Other, Pacerone [amiodarone], Erythromycin, and Zyloprim [allopurinol]    Social History:  The patient  reports that she has never smoked. She has never used smokeless tobacco. She reports current alcohol use. She reports that she does not use drugs.   Family History:  The patient's family history includes Breast cancer in her sister; Emphysema in her father; Heart disease in her mother.    ROS: Noted in current history.  Otherwise negative.   Physical Exam: Blood pressure 136/60, pulse (!) 53, height 5' 2.5" (1.588 m), weight 136 lb 6.4 oz (61.9 kg), SpO2 94%.      GEN:  Well nourished, well developed in no acute distress HEENT: Normal NECK: No JVD; No carotid bruits LYMPHATICS: No lymphadenopathy CARDIAC: RRR , no murmurs, rubs, gallops RESPIRATORY:  Clear to auscultation without rales, wheezing or rhonchi  ABDOMEN: Soft, non-tender, non-distended MUSCULOSKELETAL:  No edema; No deformity  SKIN: Warm and dry NEUROLOGIC:  non focal.  Has spastic dysponia     EKG:              Recent Labs: 04/03/2023: TSH 3.096 04/04/2023: ALT 12 09/05/2023: BUN 9; Creatinine, Ser 0.63; Hemoglobin 10.4; Magnesium 1.9; Platelets 257; Potassium 4.5; Sodium 138    Lipid Panel    Component Value Date/Time   CHOL 172 11/21/2019 1035   TRIG 96 11/21/2019 1035   HDL 59 11/21/2019 1035   CHOLHDL 2.9 11/21/2019 1035   CHOLHDL 3.4 08/18/2015 1147   VLDL 17 08/18/2015 1147   LDLCALC 96 11/21/2019 1035      Wt Readings from Last 3 Encounters:  12/27/23 136 lb 6.4 oz (61.9 kg)  09/05/23 132 lb 3.2 oz (60 kg)  08/01/23 135 lb (61.2 kg)      Other studies Reviewed: Additional studies/ records that were reviewed today include: . Review of the above records demonstrates:    ASSESSMENT AND PLAN:  1.  Constrictive Pericarditis -  S/p stripping  Is dong well .   2.  A-Flutter / Fibrillation -      Remains in NSR . On Flecainide  Check BMP , mag level today   She will have an ecg next week with Dr. Elberta Fortis.    3.  Renal cell carcinoma:       4. Spastic dysphonia -    stable      6. HTN -      Current medicines are reviewed at length with the patient today.  The patient does not have concerns regarding medicines.  The following changes have been made:  no change  Labs/ tests ordered today include:   No orders of the defined types were placed in this encounter.    Disposition:        Yolanda Miss, MD  12/27/2023 10:02 AM    [facility] [address] [place], [place], [place]  [address] Phone: [phone]; Fax: [phone]

## 2023-12-27 ENCOUNTER — Ambulatory Visit: Payer: Medicare PPO | Attending: Cardiovascular Disease | Admitting: Cardiovascular Disease

## 2023-12-27 ENCOUNTER — Encounter: Payer: Self-pay | Admitting: Cardiovascular Disease

## 2023-12-27 VITALS — BP 136/60 | HR 53 | Ht 62.5 in | Wt 136.4 lb

## 2023-12-27 DIAGNOSIS — I480 Paroxysmal atrial fibrillation: Secondary | ICD-10-CM

## 2023-12-27 DIAGNOSIS — I10 Essential (primary) hypertension: Secondary | ICD-10-CM

## 2023-12-27 DIAGNOSIS — Z79899 Other long term (current) drug therapy: Secondary | ICD-10-CM

## 2023-12-27 DIAGNOSIS — Z5181 Encounter for therapeutic drug level monitoring: Secondary | ICD-10-CM

## 2023-12-27 LAB — BASIC METABOLIC PANEL
BUN/Creatinine Ratio: 15 (ref 12–28)
BUN: 11 mg/dL (ref 8–27)
CO2: 22 mmol/L (ref 20–29)
Calcium: 9.6 mg/dL (ref 8.7–10.3)
Chloride: 103 mmol/L (ref 96–106)
Creatinine, Ser: 0.72 mg/dL (ref 0.57–1.00)
Glucose: 87 mg/dL (ref 70–99)
Potassium: 4.8 mmol/L (ref 3.5–5.2)
Sodium: 141 mmol/L (ref 134–144)
eGFR: 84 mL/min/{1.73_m2} (ref >59)

## 2023-12-27 LAB — MAGNESIUM: Magnesium: 1.9 mg/dL (ref 1.6–2.3)

## 2023-12-27 NOTE — Patient Instructions (Signed)
 Medication Instructions:  Your physician recommends that you continue on your current medications as directed. Please refer to the Current Medication list given to you today.  *If you need a refill on your cardiac medications before your next appointment, please call your pharmacy*  Lab Work: TODAY: BMP, magnesium If you have labs (blood work) drawn today and your tests are completely normal, you will receive your results only by: MyChart Message (if you have MyChart) OR A paper copy in the mail If you have any lab test that is abnormal or we need to change your treatment, we will call you to review the results.  Follow-Up: At [facility], you and your health needs are our priority.  As part of our continuing mission to provide you with exceptional heart care, we have created designated Provider Care Teams.  These Care Teams include your primary Cardiologist (physician) and Advanced Practice Providers (APPs -  Physician Assistants and Nurse Practitioners) who all work together to provide you with the care you need, when you need it.  Your next appointment:   1 year(s)  The format for your next appointment:   In Person  Provider:   Kristeen Miss, MD {  Other Instructions   1st Floor: - Lobby - Registration  - Pharmacy  - Lab - Cafe  2nd Floor: - PV Lab - Diagnostic Testing (echo, CT, nuclear med)  3rd Floor: - Vacant  4th Floor: - TCTS (cardiothoracic surgery) - AFib Clinic - Structural Heart Clinic - Vascular Surgery  - Vascular Ultrasound  5th Floor: - HeartCare Cardiology (general and EP) - Clinical Pharmacy for coumadin, hypertension, lipid, weight-loss medications, and med management appointments    Valet parking services will be available as well.

## 2023-12-29 ENCOUNTER — Encounter: Payer: Self-pay | Admitting: Cardiovascular Disease

## 2023-12-30 ENCOUNTER — Telehealth: Payer: Self-pay

## 2023-12-30 DIAGNOSIS — I10 Essential (primary) hypertension: Secondary | ICD-10-CM

## 2023-12-30 DIAGNOSIS — I480 Paroxysmal atrial fibrillation: Secondary | ICD-10-CM

## 2023-12-30 NOTE — Telephone Encounter (Signed)
 Called and spoke with patient who verbalized understanding. She will purchase OTC Mag Ox 400mg  and take once daily. Repeat labs entered and released and pt will come in 1 month.

## 2023-12-30 NOTE — Telephone Encounter (Signed)
-----   Message from Kristeen Miss sent at 12/29/2023  8:03 AM EDT ----- Potassium looks great  Mag level is slightly low at 1.9 Add Mag ox 400 mg a day  Recheck BMP and mag levels in 1 month

## 2024-01-04 ENCOUNTER — Telehealth: Payer: Self-pay | Admitting: Cardiology

## 2024-01-04 ENCOUNTER — Encounter: Payer: Self-pay | Admitting: Cardiology

## 2024-01-04 ENCOUNTER — Ambulatory Visit: Payer: Medicare PPO | Attending: Cardiology | Admitting: Cardiology

## 2024-01-04 VITALS — BP 110/62 | HR 68 | Ht 62.5 in | Wt 134.6 lb

## 2024-01-04 DIAGNOSIS — D6869 Other thrombophilia: Secondary | ICD-10-CM | POA: Diagnosis not present

## 2024-01-04 DIAGNOSIS — Z79899 Other long term (current) drug therapy: Secondary | ICD-10-CM | POA: Diagnosis not present

## 2024-01-04 DIAGNOSIS — Z5181 Encounter for therapeutic drug level monitoring: Secondary | ICD-10-CM

## 2024-01-04 DIAGNOSIS — I471 Supraventricular tachycardia, unspecified: Secondary | ICD-10-CM | POA: Diagnosis not present

## 2024-01-04 DIAGNOSIS — I4819 Other persistent atrial fibrillation: Secondary | ICD-10-CM | POA: Diagnosis not present

## 2024-01-04 NOTE — Telephone Encounter (Signed)
 Spoke with Pt. Told her that Sherri (Dr Elberta Fortis nurse) was taking care of that for her and she would hear from someone soon. Pt states understanding.

## 2024-01-04 NOTE — Progress Notes (Signed)
 Electrophysiology Office Note:   Date:  01/04/2024  ID:  Yolanda Bell Mar 22, 1941, MRN [id number]  Primary Cardiologist: Kristeen Miss, MD Primary Heart Failure: None Electrophysiologist: Deola Rewis Jorja Loa, MD      History of Present Illness:   Yolanda Bell is a 83 y.o. female with h/o constrictive pericarditis post pericardial stripping in 2006, SVT post ablation, hypertension, gout, atrial fibrillation/flutter seen today for routine electrophysiology followup.   Since last being seen in our clinic the patient reports doing well.  She has not noted any further episodes of atrial fibrillation.  She is happy with how she is feeling.  She is having issues with her knee.  She had a right knee replacement and has scar tissue.  She was told that she [name] need a closed manipulation, but did not want to proceed with this.  She has some decreased range of motion.  Otherwise, she has no acute complaints.  she denies chest pain, palpitations, dyspnea, PND, orthopnea, nausea, vomiting, dizziness, syncope, edema, weight gain, or early satiety.   Review of systems complete and found to be negative unless listed in HPI.   EP Information / Studies Reviewed:    EKG is ordered today. Personal review as below.  EKG Interpretation Date/Time:  Wednesday January 04 2024 13:52:34 EDT Ventricular Rate:  64 PR Interval:    QRS Duration:  90 QT Interval:  440 QTC Calculation: 453 R Axis:   91  Text Interpretation: Junctional rhythm Rightward axis When compared with ECG of 04-Jan-2024 13:52, Previous ECG has undetermined rhythm, needs review Confirmed by Yolanda Bell ([id number]) on 01/04/2024 1:54:25 PM     Risk Assessment/Calculations:    CHA2DS2-VASc Score = 5   This indicates a 7.2% annual risk of stroke. The patient's score is based upon: CHF History: 1 HTN History: 1 Diabetes History: 0 Stroke History: 0 Vascular Disease History: 0 Age Score: 2 Gender Score: 1             Physical  Exam:   VS:  BP 110/62 (BP Location: Left Arm, Patient Position: Sitting, Cuff Size: Normal)   Pulse 68   Ht 5' 2.5" (1.588 m)   Wt 134 lb 9.6 oz (61.1 kg)   SpO2 99%   BMI 24.23 kg/m    Wt Readings from Last 3 Encounters:  01/04/24 134 lb 9.6 oz (61.1 kg)  12/27/23 136 lb 6.4 oz (61.9 kg)  09/05/23 132 lb 3.2 oz (60 kg)     GEN: Well nourished, well developed in no acute distress NECK: No JVD; No carotid bruits CARDIAC: Regular rate and rhythm, no murmurs, rubs, gallops RESPIRATORY:  Clear to auscultation without rales, wheezing or rhonchi  ABDOMEN: Soft, non-tender, non-distended EXTREMITIES:  No edema; No deformity   ASSESSMENT AND PLAN:    1.  Paroxysmal atrial fibrillation/atypical atrial flutter: Post ablation in 2017.  Had recurrence.  Now on dofetilide 250 mg twice daily.  She is in sinus rhythm.  She is having some junctional escape beats and slow sinus rates.  Her QRS remains narrow.  Because she is asymptomatic, would continue with current management.  If she becomes more bradycardic, would plan to stop metoprolol.  2.  Secondary hypercoagulable state: Currently on Eliquis for atrial fibrillation  3.  High risk medication monitoring: Currently on dofetilide for atrial fibrillation.  QTc remains stable.  BMP and magnesium without issue.  4.  chronic systolic heart failure: Ejection fraction is normalized with maintenance of sinus rhythm  Follow up  with Afib Clinic in 6 months  Signed, Dvontae Ruan Jorja Loa, MD

## 2024-01-04 NOTE — Telephone Encounter (Signed)
 Pt states that she called to make appt with Afib as told at today's visit. Pt says that they scheduled her for April when it is supposed to be or 6 months out. Pt states that she was told that our office would have to call to reschedule the appt that she cancelled for April. Please advise

## 2024-01-04 NOTE — Patient Instructions (Signed)
 Medication Instructions:  Your physician recommends that you continue on your current medications as directed. Please refer to the Current Medication list given to you today.  *If you need a refill on your cardiac medications before your next appointment, please call your pharmacy*   Lab Work: None ordered   Testing/Procedures: None ordered   Follow-Up: At [facility], you and your health needs are our priority.  As part of our continuing mission to provide you with exceptional heart care, we have created designated Provider Care Teams.  These Care Teams include your primary Cardiologist (physician) and Advanced Practice Providers (APPs -  Physician Assistants and Nurse Practitioners) who all work together to provide you with the care you need, when you need it.  Your next appointment:   6 month(s)  The format for your next appointment:   In Person  Provider:   You will follow up in the Atrial Fibrillation Clinic located at [facility]. Your provider will be: Clint R. Fenton, PA-C or Lake Bells, PA-C    Thank you for choosing [facility]!!   Dory Horn, RN [phone]

## 2024-01-13 ENCOUNTER — Other Ambulatory Visit: Payer: Self-pay | Admitting: Cardiovascular Disease

## 2024-01-13 DIAGNOSIS — I480 Paroxysmal atrial fibrillation: Secondary | ICD-10-CM

## 2024-01-13 NOTE — Telephone Encounter (Signed)
 Prescription refill request for Xarelto received.  Indication:afib Last office visit:3/25 Weight:61.1  kg Age:83 Scr:0.72  3/25 CrCl:59.11  ml/min  Prescription refilled

## 2024-01-18 ENCOUNTER — Ambulatory Visit (HOSPITAL_COMMUNITY): Admitting: Physician Assistant

## 2024-02-01 ENCOUNTER — Other Ambulatory Visit: Payer: Self-pay | Admitting: Urology

## 2024-02-01 DIAGNOSIS — D49512 Neoplasm of unspecified behavior of left kidney: Secondary | ICD-10-CM

## 2024-02-07 DIAGNOSIS — I480 Paroxysmal atrial fibrillation: Secondary | ICD-10-CM | POA: Diagnosis not present

## 2024-02-07 DIAGNOSIS — I10 Essential (primary) hypertension: Secondary | ICD-10-CM | POA: Diagnosis not present

## 2024-02-08 LAB — BASIC METABOLIC PANEL WITH GFR
BUN/Creatinine Ratio: 15 (ref 12–28)
BUN: 10 mg/dL (ref 8–27)
CO2: 25 mmol/L (ref 20–29)
Calcium: 10 mg/dL (ref 8.7–10.3)
Chloride: 99 mmol/L (ref 96–106)
Creatinine, Ser: 0.65 mg/dL (ref 0.57–1.00)
Glucose: 105 mg/dL — ABNORMAL HIGH (ref 70–99)
Potassium: 4.5 mmol/L (ref 3.5–5.2)
Sodium: 140 mmol/L (ref 134–144)
eGFR: 88 mL/min/{1.73_m2} (ref >59)

## 2024-02-08 LAB — MAGNESIUM: Magnesium: 1.9 mg/dL (ref 1.6–2.3)

## 2024-02-28 ENCOUNTER — Encounter: Payer: Self-pay | Admitting: Urology

## 2024-03-10 ENCOUNTER — Ambulatory Visit
Admission: RE | Admit: 2024-03-10 | Discharge: 2024-03-10 | Disposition: A | Discharge status: Home / Self Care | Source: Ambulatory Visit | Attending: Urology | Admitting: Urology

## 2024-03-10 DIAGNOSIS — D49512 Neoplasm of unspecified behavior of left kidney: Secondary | ICD-10-CM

## 2024-03-10 DIAGNOSIS — R93422 Abnormal radiologic findings on diagnostic imaging of left kidney: Secondary | ICD-10-CM | POA: Diagnosis not present

## 2024-03-10 MED ORDER — GADOPICLENOL 0.5 MMOL/ML IV SOLN
6.0000 mL | Freq: Once | INTRAVENOUS | Status: AC | PRN
  Administered 2024-03-10: 6 mL via INTRAVENOUS

## 2024-03-14 ENCOUNTER — Other Ambulatory Visit: Payer: Self-pay | Admitting: Cardiovascular Disease

## 2024-03-22 DIAGNOSIS — M810 Age-related osteoporosis without current pathological fracture: Secondary | ICD-10-CM | POA: Diagnosis not present

## 2024-04-24 ENCOUNTER — Other Ambulatory Visit (HOSPITAL_COMMUNITY): Payer: Self-pay | Admitting: Cardiology

## 2024-04-26 DIAGNOSIS — D49512 Neoplasm of unspecified behavior of left kidney: Secondary | ICD-10-CM | POA: Diagnosis not present

## 2024-05-24 DIAGNOSIS — Z6824 Body mass index (BMI) 24.0-24.9, adult: Secondary | ICD-10-CM | POA: Diagnosis not present

## 2024-05-24 DIAGNOSIS — D649 Anemia, unspecified: Secondary | ICD-10-CM | POA: Diagnosis not present

## 2024-05-24 DIAGNOSIS — J383 Other diseases of vocal cords: Secondary | ICD-10-CM | POA: Diagnosis not present

## 2024-05-24 DIAGNOSIS — Z0000 Encounter for general adult medical examination without abnormal findings: Secondary | ICD-10-CM | POA: Diagnosis not present

## 2024-05-24 DIAGNOSIS — I480 Paroxysmal atrial fibrillation: Secondary | ICD-10-CM | POA: Diagnosis not present

## 2024-05-24 DIAGNOSIS — Z1159 Encounter for screening for other viral diseases: Secondary | ICD-10-CM | POA: Diagnosis not present

## 2024-05-24 DIAGNOSIS — M8580 Other specified disorders of bone density and structure, unspecified site: Secondary | ICD-10-CM | POA: Diagnosis not present

## 2024-05-24 DIAGNOSIS — I471 Supraventricular tachycardia, unspecified: Secondary | ICD-10-CM | POA: Diagnosis not present

## 2024-05-24 DIAGNOSIS — D509 Iron deficiency anemia, unspecified: Secondary | ICD-10-CM | POA: Diagnosis not present

## 2024-05-24 DIAGNOSIS — M8588 Other specified disorders of bone density and structure, other site: Secondary | ICD-10-CM | POA: Diagnosis not present

## 2024-05-24 DIAGNOSIS — C649 Malignant neoplasm of unspecified kidney, except renal pelvis: Secondary | ICD-10-CM | POA: Diagnosis not present

## 2024-06-14 ENCOUNTER — Encounter: Payer: Self-pay | Admitting: Cardiology

## 2024-06-14 DIAGNOSIS — I5032 Chronic diastolic (congestive) heart failure: Secondary | ICD-10-CM | POA: Diagnosis not present

## 2024-06-15 DIAGNOSIS — Z1231 Encounter for screening mammogram for malignant neoplasm of breast: Secondary | ICD-10-CM | POA: Diagnosis not present

## 2024-07-09 ENCOUNTER — Other Ambulatory Visit (HOSPITAL_COMMUNITY): Payer: Self-pay

## 2024-07-09 ENCOUNTER — Ambulatory Visit (HOSPITAL_COMMUNITY)
Admission: RE | Admit: 2024-07-09 | Discharge: 2024-07-09 | Disposition: A | Discharge status: Home / Self Care | Source: Ambulatory Visit | Attending: Physician Assistant | Admitting: Physician Assistant

## 2024-07-09 VITALS — BP 150/78 | HR 79 | Ht 62.5 in | Wt 133.0 lb

## 2024-07-09 DIAGNOSIS — Z5181 Encounter for therapeutic drug level monitoring: Secondary | ICD-10-CM

## 2024-07-09 DIAGNOSIS — I340 Nonrheumatic mitral (valve) insufficiency: Secondary | ICD-10-CM

## 2024-07-09 DIAGNOSIS — Z79899 Other long term (current) drug therapy: Secondary | ICD-10-CM | POA: Diagnosis not present

## 2024-07-09 DIAGNOSIS — I480 Paroxysmal atrial fibrillation: Secondary | ICD-10-CM

## 2024-07-09 DIAGNOSIS — I4891 Unspecified atrial fibrillation: Secondary | ICD-10-CM | POA: Diagnosis not present

## 2024-07-09 DIAGNOSIS — D6869 Other thrombophilia: Secondary | ICD-10-CM

## 2024-07-09 DIAGNOSIS — D62 Acute posthemorrhagic anemia: Secondary | ICD-10-CM

## 2024-07-09 NOTE — Patient Instructions (Signed)
 Echocardiogram -- scheduling will call once insurance authorization received.    Follow up with DR Camnitz in 6 months  Follow up with Afib Clinic in 1 year

## 2024-07-09 NOTE — Progress Notes (Signed)
 Primary Care Physician: Pridgen, Taylar, NP Primary Cardiologist: Yolanda Passe, MD (Inactive) Electrophysiologist: Yolanda Gladis Norton, MD  Referring Physician: Dr. Rosario Bell Yolanda Bell is a 83 y.o. female with a history of constrictive pericarditis, SVT s/p ablation by Dr. Fernande, HTN, liver disease, spasmodic dystonia, and paroxysmal atrial fibrillation who presents for consultation in the [facility]. Recent admission due to Afib with RVR from 6/21-28/24. She underwent TEE/DCCV on 6/25 with initially junctional rhythm in the 20s then sinus beat followed by 4-5 beats of SVT. She was ultimately started on Tikosyn . Discharged on Tikosyn  250 mcg BID. Patient is on Xarelto  for stroke prevention.   Patient returns for follow up for atrial fibrillation and dofetilide  monitoring. She remains in SR today and feels well. No interim symptoms of afib. She has noted dark stools since starting iron supplement. She has a visit with Dr Yolanda Bell upcoming to evaluate anemia.   Today, she  denies symptoms of palpitations, chest pain, shortness of breath, orthopnea, PND, lower extremity edema, dizziness, presyncope, syncope, snoring, daytime somnolence, bleeding, or neurologic sequela. The patient is tolerating medications without difficulties and is otherwise without complaint today.    she has a BMI of Body mass index is 23.94 kg/m.SABRA Filed Weights   07/09/24 1013  Weight: 60.3 kg     Current Outpatient Medications  Medication Sig Dispense Refill   ascorbic acid  (VITAMIN C ) 500 MG tablet Take 500 mg by mouth daily.     Calcium Carb-Cholecalciferol  (CALCIUM 600+D) 600-20 MG-MCG TABS Take 1 tablet by mouth daily.     denosumab  (PROLIA ) 60 MG/ML SOSY injection Inject 60 mg into the skin every 6 (six) months.     dofetilide  (TIKOSYN ) 250 MCG capsule TAKE 1 CAPSULE BY MOUTH TWICE A DAY 180 capsule 2   ferrous sulfate  325 (65 FE) MG tablet 1 tablet Orally daily; Duration: 30  days     magnesium  oxide (MAG-OX) 400 MG tablet Take 400 mg by mouth daily.     metoprolol  succinate (TOPROL -XL) 25 MG 24 hr tablet TAKE 1 TABLET BY MOUTH TWICE A DAY 180 tablet 3   Multiple Vitamins-Minerals (CENTRUM SILVER PO) Take 1 tablet by mouth every morning.     spironolactone  (ALDACTONE ) 50 MG tablet TAKE 1 TABLET BY MOUTH EVERY DAY 90 tablet 3   XARELTO  20 MG TABS tablet TAKE 1 TABLET BY MOUTH DAILY WITH SUPPER 90 tablet 1   No current facility-administered medications for this encounter.    Atrial Fibrillation Management history:  Previous antiarrhythmic drugs: Tikosyn   Previous cardioversions: 04/05/23 Previous ablations: Afib/flutter ablation 2017 Anticoagulation history: Xarelto     ROS- All systems are reviewed and negative except as per the HPI above.  Physical Exam: BP (!) 150/78   Pulse 79   Ht 5' 2.5 (1.588 m)   Wt 60.3 kg   BMI 23.94 kg/m    GEN: Well nourished, well developed in no acute distress CARDIAC: Regular rate and rhythm, no murmurs, rubs, gallops RESPIRATORY:  Clear to auscultation without rales, wheezing or rhonchi  ABDOMEN: Soft, non-tender, non-distended EXTREMITIES:  No edema; No deformity    EKG today demonstrates  Junctional rhythm Vent. rate 79 BPM PR interval * ms QRS duration 106 ms QT/QTcB 380/435 ms   Echo 10/18/23 demonstrated   1. Left ventricular ejection fraction by 3D volume is 57 %. The left  ventricle has normal function. The left ventricle has no regional wall  motion abnormalities. Left ventricular diastolic  parameters were normal.  The average left ventricular global longitudinal strain is -20.2 %. The global longitudinal strain is normal.   2. Right ventricular systolic function is normal. The right ventricular  size is normal. There is normal pulmonary artery systolic pressure.   3. Left atrial size was mildly dilated.   4. Pisa - 0.6 cm - moderate MR. The mitral valve is normal in structure.  Moderate to severe  mitral valve regurgitation. No evidence of mitral  stenosis.   5. The aortic valve is tricuspid. Aortic valve regurgitation is not  visualized. No aortic stenosis is present.   6. The inferior vena cava is normal in size with greater than 50%  respiratory variability, suggesting right atrial pressure of 3 mmHg.     CHA2DS2-VASc Score = 5  The patient's score is based upon: CHF History: 0 (EF normalized) HTN History: 1 Diabetes History: 0 Stroke History: 0 Vascular Disease History: 1 Age Score: 2 Gender Score: 1       ASSESSMENT AND PLAN: Paroxysmal Atrial Fibrillation/atypical atrial flutter (ICD10:  I48.0) The patient's CHA2DS2-VASc score is 5, indicating a 7.2% annual risk of stroke.   S/p afib ablation 2017 S/p dofetilide  loading 04/2023 Patient appears to be maintaining SR Continue dofetilide  250 mcg BID Continue Toprol  25 mg BID Continue Xarelto  20 mg daily  Secondary Hypercoagulable State (ICD10:  D68.69) The patient is at significant risk for stroke/thromboembolism based upon her CHA2DS2-VASc Score of 5.  Continue Rivaroxaban  (Xarelto ). No bleeding issues.   High Risk Medication Monitoring (ICD 10: J342684) Patient requires ongoing monitoring for anti-arrhythmic medication which has the potential to cause life threatening arrhythmias. QT interval on ECG acceptable for dofetilide  monitoring. Check bmet/mag today.     SVT S/p ablation by Dr Yolanda Bell  HTN Mildly elevated today. Patient Yolanda continue to monitor. Tested her home BP machine in office today.  HFrecEF EF 57% GDMT per primary cardiology team Fluid status appears stable today  VHD Moderate to severe MR Yolanda plan to repeat echo.   CAD CAC score 236 No anginal symptoms   Follow up with Dr Yolanda Bell in 6 months. AF clinic in one year.    Daril Kicks PA-C Afib Clinic [facility] [address] [place], [place] [address] [phone]

## 2024-07-09 NOTE — Addendum Note (Signed)
 Encounter addended by: Janel Nancy SAUNDERS, RN on: 07/09/2024 1:47 PM  Actions taken: Care Teams modified

## 2024-07-10 ENCOUNTER — Ambulatory Visit (HOSPITAL_COMMUNITY): Payer: Self-pay | Admitting: Physician Assistant

## 2024-07-10 DIAGNOSIS — I5032 Chronic diastolic (congestive) heart failure: Secondary | ICD-10-CM | POA: Diagnosis not present

## 2024-07-10 LAB — BASIC METABOLIC PANEL WITH GFR
BUN/Creatinine Ratio: 12 (ref 12–28)
BUN: 9 mg/dL (ref 8–27)
CO2: 22 mmol/L (ref 20–29)
Calcium: 9.6 mg/dL (ref 8.7–10.3)
Chloride: 94 mmol/L — ABNORMAL LOW (ref 96–106)
Creatinine, Ser: 0.77 mg/dL (ref 0.57–1.00)
Glucose: 84 mg/dL (ref 70–99)
Potassium: 4.1 mmol/L (ref 3.5–5.2)
Sodium: 134 mmol/L (ref 134–144)
eGFR: 77 mL/min/1.73 (ref >59)

## 2024-07-10 LAB — MAGNESIUM: Magnesium: 2 mg/dL (ref 1.6–2.3)

## 2024-07-18 ENCOUNTER — Ambulatory Visit (HOSPITAL_COMMUNITY)
Admission: RE | Admit: 2024-07-18 | Discharge: 2024-07-18 | Disposition: A | Discharge status: Home / Self Care | Source: Ambulatory Visit | Attending: Cardiology | Admitting: Cardiology

## 2024-07-18 DIAGNOSIS — I340 Nonrheumatic mitral (valve) insufficiency: Secondary | ICD-10-CM | POA: Insufficient documentation

## 2024-07-18 LAB — ECHOCARDIOGRAM COMPLETE
Area-P 1/2: 4.6 cm2
MV M vel: 5.81 m/s
MV Peak grad: 135 mmHg
Radius: 0.65 cm
S' Lateral: 3.2 cm

## 2024-07-19 ENCOUNTER — Ambulatory Visit (HOSPITAL_COMMUNITY): Payer: Self-pay | Admitting: Physician Assistant

## 2024-07-20 ENCOUNTER — Other Ambulatory Visit (HOSPITAL_COMMUNITY): Payer: Self-pay | Admitting: *Deleted

## 2024-07-20 DIAGNOSIS — I340 Nonrheumatic mitral (valve) insufficiency: Secondary | ICD-10-CM

## 2024-07-25 DIAGNOSIS — D649 Anemia, unspecified: Secondary | ICD-10-CM | POA: Diagnosis not present

## 2024-07-25 DIAGNOSIS — R195 Other fecal abnormalities: Secondary | ICD-10-CM | POA: Diagnosis not present

## 2024-07-30 DIAGNOSIS — D649 Anemia, unspecified: Secondary | ICD-10-CM | POA: Diagnosis not present

## 2024-08-09 NOTE — Progress Notes (Signed)
 Patient ID: Yolanda Bell MRN: [id number] DOB/AGE: 1941-08-28 83 y.o.  Primary Care Physician:Miller, Olam, MD Primary Cardiologist: Wendel Electrophysiologist: Inocencio  CC:  Mitral valvular disease management     FOCUSED PROBLEM LIST:   MR Severe, atrial functional, EF 60 to 65% TTE October 2025 Constrictive pericarditis  Status post pericardial stripping 2006 MN SVT Status post ablation 2003 PAF Status post PVI 2017 Status post cardioversion 2024 Hypertension Hyperlipidemia Aortic atherosclerosis CT abdomen pelvis 2023 CKD stage II Spastic dysphonia Renal cell carcinoma Seen on serial MRIs; stable size BMI 02 September 2024:  Patient consents to use of AI scribe. The patient is an 83 year old female with the above listed medical problems referred for recommendations regarding her severe mitral regurgitation.  The patient was seen in atrial fibrillation clinic recently.  She is doing well from a cardiovascular standpoint and denied any palpitations, chest pain, shortness of breath, orthopnea.  Echocardiogram demonstrated worsening mitral regurgitation and she is referred for this reason.  The patient is doing fairly well.  She walks on a regular basis without limitations.  She gets mildly short of breath when she uses her recumbent bike at a higher rate.  She does not get short of breath with normal everyday activity.  No chest pain or swelling in her legs. She does not lay flat in bed due to nausea and uses two pillows for support. She also uses a thin cushion for her lower back due to a past compression fracture in her vertebra.  She overall feels well.  She is not required any emergency room visits or hospitalizations for heart failure issues.     Past Medical History:  Diagnosis Date   Arthritis    Atrial fibrillation (HCC)    Status post TEE cardioversion   CHF (congestive heart failure) (HCC)    Constrictive pericarditis    2 status post pericardial  stripping at the [facility]   Gout    Hypertension    Left kidney mass    Pneumonia    Spastic dysphonia    Supraventricular tachycardia    Status post RF ablation by Dr. KYM Sage    Past Surgical History:  Procedure Laterality Date   ABDOMINAL HYSTERECTOMY     1987   BOWEL RESECTION N/A 06/03/2022   Procedure: SMALL BOWEL RESECTION;  Surgeon: Tanda Locus, MD;  Location: [facility] OR;  Service: General;  Laterality: N/A;   CARDIOVERSION  11/21/2012   Procedure: CARDIOVERSION;  Surgeon: Debby Gallop, MD;  Location: [facility] ENDOSCOPY;  Service: Cardiovascular;;   CARDIOVERSION N/A 06/10/2014   Procedure: CARDIOVERSION;  Surgeon: Aleene JINNY Passe, MD;  Location: [facility] ENDOSCOPY;  Service: Cardiovascular;  Laterality: N/A;   CARDIOVERSION N/A 10/14/2015   Procedure: CARDIOVERSION;  Surgeon: Maude JAYSON Emmer, MD;  Location: [facility] ENDOSCOPY;  Service: Cardiovascular;  Laterality: N/A;   CARDIOVERSION N/A 05/12/2016   Procedure: CARDIOVERSION;  Surgeon: Annabella Scarce, MD;  Location: [facility] ENDOSCOPY;  Service: Cardiovascular;  Laterality: N/A;   CARDIOVERSION N/A 04/09/2022   Procedure: CARDIOVERSION;  Surgeon: Loni Soyla LABOR, MD;  Location: [facility] ENDOSCOPY;  Service: Cardiovascular;  Laterality: N/A;   CARDIOVERSION N/A 04/05/2023   Procedure: CARDIOVERSION;  Surgeon: Lonni Slain, MD;  Location: [facility] INVASIVE CV LAB;  Service: Cardiovascular;  Laterality: N/A;   CHOLECYSTECTOMY     1989   ELECTROPHYSIOLOGIC STUDY N/A 08/20/2016   Procedure: Atrial Fibrillation Ablation;  Surgeon: Will Gladis Inocencio, MD;  Location: [facility] INVASIVE CV LAB;  Service: Cardiovascular;  Laterality: N/A;  EYE SURGERY     cataracts   LAPAROTOMY N/A 06/03/2022   Procedure: EXPLORATORY LAPAROTOMY;  Surgeon: Tanda Locus, MD;  Location: [facility] OR;  Service: General;  Laterality: N/A;   PERICARDIECTOMY     TEE WITHOUT CARDIOVERSION N/A 06/10/2014   Procedure: TRANSESOPHAGEAL ECHOCARDIOGRAM (TEE);  Surgeon: Aleene JINNY Passe, MD;   Location: [facility] ENDOSCOPY;  Service: Cardiovascular;  Laterality: N/A;   TONSILLECTOMY     TOTAL KNEE ARTHROPLASTY Right 08/01/2023   Procedure: TOTAL KNEE ARTHROPLASTY;  Surgeon: Melodi Lerner, MD;  Location: [facility] ORS;  Service: Orthopedics;  Laterality: Right;    Family History  Problem Relation Age of Onset   Heart disease Mother    Emphysema Father    Breast cancer Sister     Social History   Socioeconomic History   Marital status: Divorced    Spouse name: Not on file   Number of children: Not on file   Years of education: Not on file   Highest education level: Not on file  Occupational History   Not on file  Tobacco Use   Smoking status: Never   Smokeless tobacco: Never  Vaping Use   Vaping status: Never Used  Substance and Sexual Activity   Alcohol use: Yes    Comment: occasionally   Drug use: No   Sexual activity: Not on file  Other Topics Concern   Not on file  Social History Narrative   Not on file   Social Drivers of Health   Financial Resource Strain: Not on file  Food Insecurity: No Food Insecurity (08/01/2023)   Hunger Vital Sign    Worried About Running Out of Food in the Last Year: Never true    Ran Out of Food in the Last Year: Never true  Transportation Needs: No Transportation Needs (08/01/2023)   PRAPARE - Administrator, Civil Service (Medical): No    Lack of Transportation (Non-Medical): No  Physical Activity: Not on file  Stress: Not on file  Social Connections: Not on file  Intimate Partner Violence: Not At Risk (08/01/2023)   Humiliation, Afraid, Rape, and Kick questionnaire    Fear of Current or Ex-Partner: No    Emotionally Abused: No    Physically Abused: No    Sexually Abused: No     Prior to Admission medications   Medication Sig Start Date End Date Taking? Authorizing Provider  ascorbic acid  (VITAMIN C ) 500 MG tablet Take 500 mg by mouth daily.    [provider]  Calcium Carb-Cholecalciferol  (CALCIUM 600+D)  600-20 MG-MCG TABS Take 1 tablet by mouth daily.    [provider]  denosumab  (PROLIA ) 60 MG/ML SOSY injection Inject 60 mg into the skin every 6 (six) months.    [provider]  dofetilide  (TIKOSYN ) 250 MCG capsule TAKE 1 CAPSULE BY MOUTH TWICE A DAY 04/24/24   Camnitz, Soyla Lunger, MD  ferrous sulfate  325 (65 FE) MG tablet 1 tablet Orally daily; Duration: 30 days 05/28/24   [provider]  magnesium  oxide (MAG-OX) 400 MG tablet Take 400 mg by mouth daily. 12/30/23   [provider]  metoprolol  succinate (TOPROL -XL) 25 MG 24 hr tablet TAKE 1 TABLET BY MOUTH TWICE A DAY 03/15/24   Nahser, Aleene JINNY, MD  Multiple Vitamins-Minerals (CENTRUM SILVER PO) Take 1 tablet by mouth every morning.    [provider]  spironolactone  (ALDACTONE ) 50 MG tablet TAKE 1 TABLET BY MOUTH EVERY DAY 06/22/23   Nahser, Aleene JINNY, MD  XARELTO   20 MG TABS tablet TAKE 1 TABLET BY MOUTH DAILY WITH SUPPER 01/13/24   Nahser, Aleene PARAS, MD    Allergies  Allergen Reactions   Corticosteroids Other (See Comments)    Liver scarring as a child   Covid-19 (Mrna) Vaccine Other (See Comments)    Postural Orthostatic Tachycardia Syndrome (POTS)   Other Other (See Comments)    Cannot take STEROIDS-unknown reaction-caused by internal scarring on liver.    Anesthesia precaution due to excessive liver scarring    Pacerone [Amiodarone] Other (See Comments)    Dizziness Visual hallucinations   Erythromycin Nausea And Vomiting and Rash   Zyloprim [Allopurinol] Rash and Other (See Comments)    Skin peeling More severe gout     REVIEW OF SYSTEMS:  General: no fevers/chills/night sweats Eyes: no blurry vision, diplopia, or amaurosis ENT: no sore throat or hearing loss Resp: no cough, wheezing, or hemoptysis CV: no edema or palpitations GI: no abdominal pain, nausea, vomiting, diarrhea, or constipation GU: no dysuria, frequency, or hematuria Skin: no rash Neuro: no headache, numbness,  tingling, or weakness of extremities Musculoskeletal: no joint pain or swelling Heme: no bleeding, DVT, or easy bruising Endo: no polydipsia or polyuria  BP (!) 140/80 (BP Location: Left Arm, Patient Position: Sitting)   Pulse 69   Ht 5' 2.25 (1.581 m)   Wt 137 lb 6.4 oz (62.3 kg)   SpO2 99%   BMI 24.93 kg/m   PHYSICAL EXAM: GEN:  AO x 3 in no acute distress HEENT: normal Dentition: Normal Neck: JVP normal. +2 carotid upstrokes without bruits. No thyromegaly. Lungs: equal expansion, clear bilaterally CV: Apex is discrete and nondisplaced, RRR with 3 out of 6 holosystolic murmur Abd: soft, non-tender, non-distended; no bruit; positive bowel sounds Ext: no edema, ecchymoses, or cyanosis Vascular: 2+ femoral pulses, 2+ radial pulses       Skin: warm and dry without rash Neuro: CN II-XII grossly intact; motor and sensory grossly intact    DATA AND STUDIES:  EKG: 2025 junctional rhythm, rightward axis  EKG Interpretation Date/Time:  Thursday August 16 2024 08:01:59 EST Ventricular Rate:  69 PR Interval:  188 QRS Duration:  92 QT Interval:  430 QTC Calculation: 460 R Axis:   94  Text Interpretation: Normal sinus rhythm Rightward axis When compared with ECG of 09-Jul-2024 10:24, ST elevation now present in Inferior leads Nonspecific T wave abnormality now evident in Lateral leads Confirmed by Wendel Haws ([id number]) on 08/16/2024 8:09:29 AM        CARDIAC STUDIES: Refer to CV Procedures and Imaging Tabs  09/05/2023: Hemoglobin 10.4; Platelets 257 07/09/2024: BUN 9; Creatinine, Ser 0.77; Magnesium  2.0; Potassium 4.1; Sodium 134   STS RISK CALCULATOR: Pending  NHYA CLASS: 1     ASSESSMENT AND PLAN:   1. Nonrheumatic mitral valve regurgitation   2. Paroxysmal atrial fibrillation (HCC)   3. Secondary hypercoagulable state   4. Hyperlipidemia LDL goal <70   5. Aortic atherosclerosis   6. Primary hypertension   7. CKD (chronic kidney disease) stage 2, GFR 60-89  ml/min   8. Spastic dysphonia   9. Renal cell carcinoma of left kidney (HCC)     Mitral regurgitation: The patient has developed severe likely atrial functional mitral regurgitation.  She is asymptomatic.  Her LV function is preserved.  For now we will continue to monitor the patient.  I will see her back in 6 months with another echocardiogram.  If she develops lifestyle limiting shortness of breath or fatigue I  would see her sooner.  She agrees with this plan. PAF: Followed by atrial fibrillation clinic.  Has undergone ablation and cardioversion.  Continue dofetilide  250 mcg twice daily, Toprol  25 mg daily, Xarelto  20 mg daily Secondary hypercoagulable state: Continue Xarelto  20 mg daily Hyperlipidemia: By virtue of the presence of aortic atherosclerosis patient's LDL goal is at least less than 70.  Consider statin at next visit defer LP(a) testing in this advanced age individual. Aortic atherosclerosis: Continue Xarelto  20 mg.  Consider statin at next visit Hypertension: Continue Toprol  25 mg, spironolactone  50 mg.  BP elevated today.  Start losartan 25 mg and check BMP in 1 week CKD stage II: Monitor for now. Spastic dysphonia: Hopefully this will not preclude a TEE.   Renal cell carcinoma: MR imaging studies have demonstrated to be small and relatively stable in size..  I have personally reviewed the patients imaging data as summarized above.  I have reviewed the natural history of mitral regurgitation with the patient and family members who are present today. We have discussed the limitations of medical therapy and the poor prognosis associated with symptomatic mitral regurgitation. We have also reviewed potential treatment options, including palliative medical therapy, conventional mitral surgery, and transcatheter mitral edge-to-edge repair. We discussed treatment options in the context of this patient's specific comorbid medical conditions.   All of the patient's questions were answered  today. Will make further recommendations based on the results of studies outlined above.   I spent 45 minutes reviewing all clinical data during and prior to this visit including all relevant imaging studies, laboratories, clinical information from other health systems and prior notes from both Cardiology and other specialties, interviewing the patient, conducting a complete physical examination, and coordinating care in order to formulate a comprehensive and personalized evaluation and treatment plan.   Atticus Wedin K Mal Asher, MD  08/16/2024 8:37 AM    [facility] [address] [place], [place], [place]  [address] Phone: [phone]; Fax: [phone]

## 2024-08-10 DIAGNOSIS — I5032 Chronic diastolic (congestive) heart failure: Secondary | ICD-10-CM | POA: Diagnosis not present

## 2024-08-12 DIAGNOSIS — I5032 Chronic diastolic (congestive) heart failure: Secondary | ICD-10-CM | POA: Diagnosis not present

## 2024-08-15 ENCOUNTER — Encounter: Payer: Self-pay | Admitting: *Deleted

## 2024-08-16 ENCOUNTER — Ambulatory Visit: Attending: Internal Medicine | Admitting: Internal Medicine

## 2024-08-16 ENCOUNTER — Encounter: Payer: Self-pay | Admitting: Internal Medicine

## 2024-08-16 VITALS — BP 140/80 | HR 69 | Ht 62.25 in | Wt 137.4 lb

## 2024-08-16 DIAGNOSIS — I700 Atherosclerosis of aorta: Secondary | ICD-10-CM | POA: Diagnosis not present

## 2024-08-16 DIAGNOSIS — D6869 Other thrombophilia: Secondary | ICD-10-CM

## 2024-08-16 DIAGNOSIS — C642 Malignant neoplasm of left kidney, except renal pelvis: Secondary | ICD-10-CM

## 2024-08-16 DIAGNOSIS — E785 Hyperlipidemia, unspecified: Secondary | ICD-10-CM

## 2024-08-16 DIAGNOSIS — I340 Nonrheumatic mitral (valve) insufficiency: Secondary | ICD-10-CM | POA: Diagnosis not present

## 2024-08-16 DIAGNOSIS — I480 Paroxysmal atrial fibrillation: Secondary | ICD-10-CM

## 2024-08-16 DIAGNOSIS — I10 Essential (primary) hypertension: Secondary | ICD-10-CM | POA: Diagnosis not present

## 2024-08-16 DIAGNOSIS — J383 Other diseases of vocal cords: Secondary | ICD-10-CM

## 2024-08-16 DIAGNOSIS — N182 Chronic kidney disease, stage 2 (mild): Secondary | ICD-10-CM

## 2024-08-16 MED ORDER — SPIRONOLACTONE 50 MG PO TABS: 50.0000 mg | ORAL_TABLET | Freq: Every day | ORAL | 3 refills | Status: AC

## 2024-08-16 MED ORDER — LOSARTAN POTASSIUM 25 MG PO TABS: 25.0000 mg | ORAL_TABLET | Freq: Every day | ORAL | 3 refills | Status: DC

## 2024-08-16 NOTE — Patient Instructions (Signed)
 Medication Instructions:  START Losartan 25 mg once daily   Refill for Spironolactone  has been sent to pharmacy.  *If you need a refill on your cardiac medications before your next appointment, please call your pharmacy*  Lab Work: To be completed in 1 week: BMP  If you have labs (blood work) drawn today and your tests are completely normal, you will receive your results only by: MyChart Message (if you have MyChart) OR A paper copy in the mail If you have any lab test that is abnormal or we need to change your treatment, we will call you to review the results.  Testing/Procedures: Your physician has requested that you have an echocardiogram in 6 months prior to follow-up appointment with Dr. Wendel. Echocardiography is a painless test that uses sound waves to create images of your heart. It provides your doctor with information about the size and shape of your heart and how well your heart's chambers and valves are working. This procedure takes approximately one hour. There are no restrictions for this procedure. Please do NOT wear cologne, perfume, aftershave, or lotions (deodorant is allowed). Please arrive 15 minutes prior to your appointment time.  Please note: We ask at that you not bring children with you during ultrasound (echo/ vascular) testing. Due to room size and safety concerns, children are not allowed in the ultrasound rooms during exams. Our front office staff cannot provide observation of children in our lobby area while testing is being conducted. An adult accompanying a patient to their appointment will only be allowed in the ultrasound room at the discretion of the ultrasound technician under special circumstances. We apologize for any inconvenience.   Follow-Up: At [facility], you and your health needs are our priority.  As part of our continuing mission to provide you with exceptional heart care, our providers are all part of one team.  This team includes  your primary Cardiologist (physician) and Advanced Practice Providers or APPs (Physician Assistants and Nurse Practitioners) who all work together to provide you with the care you need, when you need it.  Your next appointment:   6 month(s)  Provider:   Arun Thukkani, MD

## 2024-08-20 ENCOUNTER — Other Ambulatory Visit: Payer: Self-pay | Admitting: Cardiovascular Disease

## 2024-08-20 ENCOUNTER — Other Ambulatory Visit: Payer: Self-pay | Admitting: Cardiology

## 2024-08-20 DIAGNOSIS — I480 Paroxysmal atrial fibrillation: Secondary | ICD-10-CM

## 2024-08-20 NOTE — Telephone Encounter (Signed)
*  STAT* If patient is at the pharmacy, call can be transferred to refill team.   1. Which medications need to be refilled? (please list name of each medication and dose if known)   XARELTO  20 MG TABS tablet     2. Would you like to learn more about the convenience, safety, & potential cost savings by using the [facility]? no   3. Are you open to using the [facility] (Type [facility]. ). no   4. Which pharmacy/location (including street and city if local pharmacy) is medication to be sent to? [id number] - [place], [place] - [address]     5. Do they need a 30 day or 90 day supply? 90 day    Pt is out of medication

## 2024-08-20 NOTE — Telephone Encounter (Signed)
*  STAT* If patient is at the pharmacy, call can be transferred to refill team.   1. Which medications need to be refilled? (please list name of each medication and dose if known)   XARELTO  20 MG TABS tablet    2. Which pharmacy/location (including street and city if local pharmacy) is medication to be sent to?  [id number] - [place], [place] - [address]    3. Do they need a 30 day or 90 day supply? 90

## 2024-08-21 MED ORDER — RIVAROXABAN 20 MG PO TABS: 20.0000 mg | ORAL_TABLET | Freq: Every day | ORAL | 1 refills | Status: AC

## 2024-08-21 NOTE — Telephone Encounter (Signed)
 Prescription refill request for Xarelto  received.  Indication: a-fib Last office visit:08/16/2024 Weight: 62.3kg Age: 83 Scr: 0.77 (EPIC 07/09/2024) CrCl: 56

## 2024-08-28 ENCOUNTER — Ambulatory Visit: Payer: Self-pay | Admitting: Internal Medicine

## 2024-08-28 LAB — BASIC METABOLIC PANEL WITH GFR
BUN/Creatinine Ratio: 20 (ref 12–28)
BUN: 12 mg/dL (ref 8–27)
CO2: 28 mmol/L (ref 20–29)
Calcium: 9.4 mg/dL (ref 8.7–10.3)
Chloride: 99 mmol/L (ref 96–106)
Creatinine, Ser: 0.59 mg/dL (ref 0.57–1.00)
Glucose: 91 mg/dL (ref 70–99)
Potassium: 4.6 mmol/L (ref 3.5–5.2)
Sodium: 138 mmol/L (ref 134–144)
eGFR: 90 mL/min/1.73 (ref >59)

## 2024-08-30 DIAGNOSIS — D649 Anemia, unspecified: Secondary | ICD-10-CM | POA: Diagnosis not present

## 2024-09-09 DIAGNOSIS — I5032 Chronic diastolic (congestive) heart failure: Secondary | ICD-10-CM | POA: Diagnosis not present

## 2024-09-17 ENCOUNTER — Telehealth: Payer: Self-pay | Admitting: Internal Medicine

## 2024-09-17 NOTE — Telephone Encounter (Signed)
 Pt c/o BP issue: STAT if pt c/o blurred vision, one-sided weakness or slurred speech.  STAT if BP is GREATER than 180/120 TODAY.  STAT if BP is LESS than 90/60 and SYMPTOMATIC TODAY  1. What is your BP concern?   See below  2. Have you taken any BP medication today?  Yes - Metoprolol  this morning and Losartan  recently  3. What are your last 5 BP readings?  All taken today (12/8) within the last 1/2 hour 111/50 113/48 112/52 109/49 109/50  4. Are you having any other symptoms (ex. Dizziness, headache, blurred vision, passed out)?   Discomfort in both upper right arms which didn't last long when she started the losartan  (around 11/6), tiredness  Patient is concerned her BP has been trending low and had been taking her metoprolol  succinate (TOPROL -XL) 25 MG 24 hr tablet at midday.  Patient is concerned her BP systolic number has been going down.   Patient wants to take her losartan  (COZAAR ) 25 MG tablet at night time.

## 2024-09-17 NOTE — Telephone Encounter (Signed)
 Patient would like advice on blood pressure medication . Per patient she is taking Toprol  XL 25 mg  twice a day and sometimes  at night when diastolic blood pressure is 50 or below patient reports taking half toprol  XL at night and reports  Losartan  25 mg after lunch. Per patient diastolic has been mid 40 to upper 50's. Patient reports she gets lightheaded after taking the losartan  for about half  hour and would like an appt. Appt scheduled for 12/9 @ 1:30. Made patient aware ED Precautions reviewed with the patient and understanding verbalized.

## 2024-09-18 ENCOUNTER — Ambulatory Visit: Attending: Physician Assistant | Admitting: Physician Assistant

## 2024-09-18 VITALS — BP 158/68 | HR 88 | Ht 62.25 in | Wt 137.0 lb

## 2024-09-18 DIAGNOSIS — D6869 Other thrombophilia: Secondary | ICD-10-CM

## 2024-09-18 DIAGNOSIS — I4719 Other supraventricular tachycardia: Secondary | ICD-10-CM

## 2024-09-18 DIAGNOSIS — I10 Essential (primary) hypertension: Secondary | ICD-10-CM

## 2024-09-18 DIAGNOSIS — I480 Paroxysmal atrial fibrillation: Secondary | ICD-10-CM

## 2024-09-18 DIAGNOSIS — Z5181 Encounter for therapeutic drug level monitoring: Secondary | ICD-10-CM

## 2024-09-18 DIAGNOSIS — Z79899 Other long term (current) drug therapy: Secondary | ICD-10-CM | POA: Diagnosis not present

## 2024-09-18 MED ORDER — METOPROLOL SUCCINATE ER 25 MG PO TB24: 25.0000 mg | ORAL_TABLET | Freq: Every day | ORAL | 3 refills | Status: AC

## 2024-09-18 NOTE — Progress Notes (Signed)
 Cardiology Office Note:  .   Date:  09/18/2024  ID:  Raquell, Richer 01-24-1941, MRN [id number] PCP: Cleotilde Planas, MD  [facility] Providers Cardiologist:  Aleene Passe, MD (Inactive) >> Dr. Wendel Electrophysiologist:  Will Gladis Norton, MD {  History of Present Illness: .   Yolanda Bell is a 83 y.o. female w/PMHx of  HTN, gout Hx of liver disease (she reports 2/2 hx of recurrent need for stroids, especially as a child) constrictive pericarditis status post pericardial stripping in [facility] 2006 spastic dysphonia SVT (s/p ablation by Dr. Fernande very remotely),  atrial fibrillation/flutter (post PVI ablation 2017)  VHD (MR)  Saw the Afib clinic post tikosyn  load 04/18/23, stable QTc, maintaining SR, no changes were made  Saw B. Aniceto, NP 04/18/23, again doing well, some PVCs described as residual, stable EKG, no changes were made  I saw her 09/05/23 Doing well maintaining SR Stable QTc, no changes made Planned for updated echo LVEF  recovered to 57% Mod-severe MR >> planned for a 6 mo repeat given no symptoms  Seeing Dr. Norton, cards team and AFib clinic since then 07/09/24 with the AFib clinic following with GI for anemia, started on an iron supplement. No symptoms of AFib. Planned updated echo  LVEF 60-65%, severe MR and referred to structural team  She saw Dr. Wendel 08/16/24, reported good exertional capacity, walking with no limitations Sleeps with 2 pillows chronically as well as back support 2/2 chronic back issues With no symptoms, limitations, planned f/u in 6 mo   Today's visit is scheduled as a 4 mo visit ROS:   Cardiac-wise, doing OK No CP, palpitations or cardiac awareness No symptoms of AFib No near syncope or syncope No SOB  She is emotional today, having water  heater problems, delayed repairs, and some electrical issues as well.  Her PMD is monitoring her BP via a machine at home that reports to her Reports her SBP generally very  good 120's (some lower 90's), though is very worried because often her DBP is in the 40's    Arrhythmia/AAD hx SVT ablation by Dr. Fernande very remotely PVI ablation 07/21/2016 (dr. Norton) Tikosyn  started June 2024  Studies Reviewed: SABRA    EKG done today and reviewed by myself:  SR 76n[m, QTc   07/18/24: TTE 1. Left ventricular ejection fraction, by estimation, is 60 to 65%. The  left ventricle has normal function. The left ventricle has no regional  wall motion abnormalities. Left ventricular diastolic function could not  be evaluated.   2. Right ventricular systolic function is mildly reduced. The right  ventricular size is normal. There is normal pulmonary artery systolic  pressure.   3. Left atrial size was severely dilated.   4. PISA 0.8cm, EROA 0.23, Rvol 47mL. Severe by VCW. No clear flow  reversal in interrogated pulmonary vein. Appears to have progressed since  prevoius echo, TEE may be helpful for further evaluation. . The mitral  valve is degenerative. Moderate to severe  mitral valve regurgitation. No evidence of mitral stenosis.   5. Tricuspid valve regurgitation is mild to moderate.   6. The aortic valve is normal in structure. Aortic valve regurgitation is  not visualized. No aortic stenosis is present.   7. The inferior vena cava is normal in size with greater than 50%  respiratory variability, suggesting right atrial pressure of 3 mmHg.    10/18/23: TTE  1. Left ventricular ejection fraction by 3D volume is 57 %. The  left  ventricle has normal function. The left ventricle has no regional wall  motion abnormalities. Left ventricular diastolic parameters were normal.  The average left ventricular global  longitudinal strain is -20.2 %. The global longitudinal strain is normal.   2. Right ventricular systolic function is normal. The right ventricular  size is normal. There is normal pulmonary artery systolic pressure.   3. Left atrial size was mildly  dilated.   4. Pisa - 0.6 cm - moderate MR. The mitral valve is normal in structure.  Moderate to severe mitral valve regurgitation. No evidence of mitral  stenosis.   5. The aortic valve is tricuspid. Aortic valve regurgitation is not  visualized. No aortic stenosis is present.   6. The inferior vena cava is normal in size with greater than 50%  respiratory variability, suggesting right atrial pressure of 3 mmHg.    04/03/23: TTE  1. Left ventricular ejection fraction, by estimation, is 40 to 45%. The  left ventricle has mildly decreased function. The left ventricle  demonstrates global hypokinesis. Left ventricular diastolic parameters are  indeterminate.   2. Right ventricular systolic function is mildly reduced. The right  ventricular size is normal. There is normal pulmonary artery systolic  pressure. The estimated right ventricular systolic pressure is 22.7 mmHg.   3. Left atrial size was mildly dilated.   4. The mitral valve is abnormal. Mild mitral valve regurgitation. No  evidence of mitral stenosis.   5. The aortic valve is tricuspid. There is mild calcification of the  aortic valve. There is mild thickening of the aortic valve. Aortic valve  regurgitation is not visualized. Aortic valve sclerosis is present, with  no evidence of aortic valve stenosis.   6. The inferior vena cava is dilated in size with >50% respiratory  variability, suggesting right atrial pressure of 8 mmHg.   Comparison(s): Changes from prior study are noted. EF decreased slightly  compared to prior.    06/23/2021 TTE IMPRESSIONS   1. Left ventricular ejection fraction, by estimation, is 50 to 55%. The  left ventricle has low normal function. The left ventricle has no regional  wall motion abnormalities. Left ventricular diastolic parameters were  normal.   2. Right ventricular systolic function is normal. The right ventricular  size is normal. There is normal pulmonary artery systolic pressure.   3.  Left atrial size was moderately dilated.   4. The mitral valve is normal in structure. Trivial mitral valve  regurgitation. No evidence of mitral stenosis.   5. Tricuspid valve regurgitation is moderate.   6. The aortic valve is tricuspid. Aortic valve regurgitation is not  visualized. No aortic stenosis is present.   7. The inferior vena cava is normal in size with greater than 50%  respiratory variability, suggesting right atrial pressure of 3 mmHg.      08/20/2016: EPS/ablation CONCLUSIONS: 1. Sinus rhythm upon presentation.   2. Successful electrical isolation and anatomical encircling of all four pulmonary veins with radiofrequency current. 3. No inducible arrhythmias following ablation both on and off of dobutamine  4. No early apparent complications.   Risk Assessment/Calculations:    Physical Exam:   VS:  There were no vitals taken for this visit.   Wt Readings from Last 3 Encounters:  08/16/24 137 lb 6.4 oz (62.3 kg)  07/09/24 133 lb (60.3 kg)  01/04/24 134 lb 9.6 oz (61.1 kg)    GEN: Well nourished, well developed in no acute distress NECK: No JVD; No carotid bruits CARDIAC:  RRR, no murmurs, rubs, gallops RESPIRATORY:  CTA b/l without rales, wheezing or rhonchi  ABDOMEN: Soft, non-tender, non-distended EXTREMITIES: No edema; No deformity    ASSESSMENT AND PLAN: .    paroxysmal AFib Atypical Flutter CHA2DS2Vasc is 4, on Xarelto , appropriately dosed ATach Tikosyn  w/stable QTc Will get mag level  4. CM Suspect 2/2 AFib Recovered LVEF by last 2 echos  5. Secondary hypercoagulable state 2/2 AFib  6. VHD Severe MR asymptomatic Following with Dr. Karalee team  7.   HTN Relative lower BPs of late (diastolic especially) Will reduce her toprol  to once daily from BID     Dispo: back in 86mo, sooner if needed  Signed, Charlies Macario Arthur, PA-C

## 2024-09-18 NOTE — Patient Instructions (Addendum)
 Medication Instructions:    START  TAKING :   TOPROL    XL  25 MG ONCE  A  DAY     *If you need a refill on your cardiac medications before your next appointment, please call your pharmacy*    Lab Work:   PLEASE GO DOWN STAIRS  [facility]  FIRST FLOOR   ( GET OFF ELEVATORS WALK TOWARDS WAITING AREA LAB LOCATED BY PHARMACY):  mag today        If you have labs (blood work) drawn today and your tests are completely normal, you will receive your results only by: MyChart Message (if you have MyChart) OR A paper copy in the mail If you have any lab test that is abnormal or we need to change your treatment, we will call you to review the results.    Testing/Procedures:NONE ORDERED  TODAY      Follow-Up: At [facility], you and your health needs are our priority.  As part of our continuing mission to provide you with exceptional heart care, our providers are all part of one team.  This team includes your primary Cardiologist (physician) and Advanced Practice Providers or APPs (Physician Assistants and Nurse Practitioners) who all work together to provide you with the care you need, when you need it.  Your next appointment:    6 month(s)  Provider:    Charlies Arthur, PA-C    We recommend signing up for the patient portal called MyChart.  Sign up information is provided on this After Visit Summary.  MyChart is used to connect with patients for Virtual Visits (Telemedicine).  Patients are able to view lab/test results, encounter notes, upcoming appointments, etc.  Non-urgent messages can be sent to your provider as well.   To learn more about what you can do with MyChart, go to [email or web address].   Other Instructions

## 2024-09-19 ENCOUNTER — Ambulatory Visit: Payer: Self-pay | Admitting: Physician Assistant

## 2024-09-19 LAB — MAGNESIUM: Magnesium: 1.9 mg/dL (ref 1.6–2.3)

## 2024-11-11 ENCOUNTER — Other Ambulatory Visit: Payer: Self-pay | Admitting: Internal Medicine

## 2025-02-11 ENCOUNTER — Ambulatory Visit (HOSPITAL_COMMUNITY)

## 2025-02-13 ENCOUNTER — Ambulatory Visit: Admitting: Internal Medicine
